# Patient Record
Sex: Male | Born: 1935 | Race: White | Hispanic: No | Marital: Married | Smoking: Never smoker
Health system: Southern US, Community
[De-identification: ages and names within clinical notes are randomized; demographics above are authoritative.]

## PROBLEM LIST (undated history)

## (undated) ENCOUNTER — Emergency Department (HOSPITAL_COMMUNITY): Payer: No Typology Code available for payment source

## (undated) DIAGNOSIS — M169 Osteoarthritis of hip, unspecified: Secondary | ICD-10-CM

## (undated) DIAGNOSIS — M1990 Unspecified osteoarthritis, unspecified site: Secondary | ICD-10-CM

## (undated) DIAGNOSIS — M545 Low back pain, unspecified: Secondary | ICD-10-CM

## (undated) DIAGNOSIS — E079 Disorder of thyroid, unspecified: Secondary | ICD-10-CM

## (undated) DIAGNOSIS — M79641 Pain in right hand: Secondary | ICD-10-CM

## (undated) DIAGNOSIS — Z87442 Personal history of urinary calculi: Secondary | ICD-10-CM

## (undated) DIAGNOSIS — N400 Enlarged prostate without lower urinary tract symptoms: Secondary | ICD-10-CM

## (undated) DIAGNOSIS — E039 Hypothyroidism, unspecified: Secondary | ICD-10-CM

## (undated) DIAGNOSIS — M4800 Spinal stenosis, site unspecified: Secondary | ICD-10-CM

## (undated) DIAGNOSIS — S82899A Other fracture of unspecified lower leg, initial encounter for closed fracture: Secondary | ICD-10-CM

## (undated) DIAGNOSIS — G8929 Other chronic pain: Secondary | ICD-10-CM

## (undated) DIAGNOSIS — H269 Unspecified cataract: Secondary | ICD-10-CM

## (undated) DIAGNOSIS — K7689 Other specified diseases of liver: Secondary | ICD-10-CM

## (undated) DIAGNOSIS — I10 Essential (primary) hypertension: Secondary | ICD-10-CM

## (undated) DIAGNOSIS — C801 Malignant (primary) neoplasm, unspecified: Secondary | ICD-10-CM

## (undated) DIAGNOSIS — M79642 Pain in left hand: Secondary | ICD-10-CM

## (undated) DIAGNOSIS — G4730 Sleep apnea, unspecified: Secondary | ICD-10-CM

## (undated) DIAGNOSIS — G2581 Restless legs syndrome: Secondary | ICD-10-CM

## (undated) DIAGNOSIS — N281 Cyst of kidney, acquired: Secondary | ICD-10-CM

## (undated) HISTORY — PX: EYE SURGERY: SHX253

## (undated) HISTORY — DX: Other fracture of unspecified lower leg, initial encounter for closed fracture: S82.899A

## (undated) HISTORY — DX: Benign prostatic hyperplasia without lower urinary tract symptoms: N40.0

## (undated) HISTORY — PX: BACK SURGERY: SHX140

## (undated) HISTORY — DX: Unspecified cataract: H26.9

## (undated) HISTORY — DX: Spinal stenosis, site unspecified: M48.00

---

## 1999-08-10 ENCOUNTER — Inpatient Hospital Stay (HOSPITAL_COMMUNITY): Admission: AD | Admit: 1999-08-10 | Discharge: 1999-08-11 | Payer: Self-pay | Admitting: Internal Medicine

## 1999-08-10 ENCOUNTER — Encounter: Payer: Self-pay | Admitting: Internal Medicine

## 1999-08-11 ENCOUNTER — Encounter: Payer: Self-pay | Admitting: Internal Medicine

## 2004-10-03 ENCOUNTER — Encounter: Admission: RE | Admit: 2004-10-03 | Discharge: 2004-10-03 | Payer: Self-pay | Admitting: Family Medicine

## 2004-10-11 ENCOUNTER — Encounter: Admission: RE | Admit: 2004-10-11 | Discharge: 2004-10-11 | Payer: Self-pay | Admitting: Family Medicine

## 2005-10-28 ENCOUNTER — Ambulatory Visit (HOSPITAL_COMMUNITY): Admission: RE | Admit: 2005-10-28 | Discharge: 2005-10-28 | Payer: Self-pay | Admitting: Orthopedic Surgery

## 2005-11-02 ENCOUNTER — Encounter: Admission: RE | Admit: 2005-11-02 | Discharge: 2005-11-02 | Payer: Self-pay | Admitting: Orthopedic Surgery

## 2005-11-19 ENCOUNTER — Encounter: Admission: RE | Admit: 2005-11-19 | Discharge: 2005-11-19 | Payer: Self-pay | Admitting: Orthopedic Surgery

## 2007-06-11 ENCOUNTER — Inpatient Hospital Stay (HOSPITAL_COMMUNITY): Admission: RE | Admit: 2007-06-11 | Discharge: 2007-06-12 | Payer: Self-pay | Admitting: Orthopedic Surgery

## 2010-09-04 NOTE — Op Note (Signed)
Edward Waller, Edward Waller               ACCOUNT NO.:  [id number]   MEDICAL RECORD NO.:  [id number]          PATIENT TYPE:  INP   LOCATION:  0005                         FACILITY:  [facility]   PHYSICIAN:  Marlowe Kays, M.D.  DATE OF BIRTH:  January 16, 1933   DATE OF PROCEDURE:  06/11/2007  DATE OF DISCHARGE:                               OPERATIVE REPORT   PREOPERATIVE DIAGNOSES:  Central and foraminal stenosis, L2-L3, L3-L4,  L4-L5, L5-S1.   POSTOPERATIVE DIAGNOSES:  Central and foraminal stenosis, L2-L3, L3-L4,  L4-L5, L5-S1.   OPERATION:  Central and foraminal decompression, L2 to the sacrum.   SURGEON:  Marlowe Kays, M.D.   ASSISTANT:  Georges Lynch. Darrelyn Hillock, M.D.   ANESTHESIA:  General.   JUSTIFICATION FOR PROCEDURE:  He is having back and bilateral leg-pain,  right greater than left.  This has become progressive over the last  several years.  Plain x-rays have shown a lumbar scoliosis and myelogram  CT scan has shown significant defects at all levels, particularly L3-L4  and L4-L5.   PROCEDURE:  Prophylactic antibiotics, satisfactory general anesthesia,  Foley catheter inserted, prone position on rolls.  Back was prepped with  DuraPrep and draped in sterile field.  Ioban utilized.  Time-out  performed.   I made a vertical midline incision with dissection of soft tissue off  the spinous processes, roughly the L2 to the sacrum.  I tagged two  spinous processes at the proximal and distal ends of wound with the  Kocher clamps being on L3 and the sacrum.  Consequently, I extended the  incision slightly cephalad, dissected additional tissue off and then re  x-rayed him, confirming that we were at L2 and at L5 with the Kocher  clamps and a Penfield 4 clamp at the L4-L5 interspace.  Based on this,  we dissected soft tissue off the neural arches from L2 to the sacrum.  I  placed two self-retaining McCullough retractors.  Double-action rongeur.  I then removed most of the neural arches from  L2 to the sacrum and then  began further decompression with 2 and 3 mm Kerrison rongeurs.  When  dissection became a little more challenging, we brought in the  microscope and completed the decompression, both centrally and  foraminally.  He had more right-leg pain than left and particularly at  L4-L5 and at L3-L4, his nerve roots at these levels were very sensitive.  There was a disc bulge at L4-L5, which was not readily accessible  because of his scoliosis and tightness, but foramina at all levels were  widely patent to hockey-stick.   At the conclusion of the case, we had estimated about 400 mL of blood  loss.  There was no dural tear or other complication noted.  The wounds  were irrigated with sterile saline and Gelfoam, soaked in thrombin, was  placed over the dura.  I then placed a 1/4 inch Penrose drain with  safety pin to the right posterior lower back.  We entirely removed the  self-retaining retractors and then closed the wound under direct  visualization to avoid impaling the drain with interrupted #  1 Vicryl in  the paralumbar muscle and fascia, 2-0 Vicryl in subcutaneous tissue and  staples in the skin.   Betadine and dry sterile dressing were applied.  He was taken to the  recovery room in satisfactory condition with no known complications.           ______________________________  Marlowe Kays, M.D.     JA/MEDQ  D:  06/11/2007  T:  06/12/2007  Job:  [id number]

## 2010-09-04 NOTE — H&P (Signed)
NAME:  Edward Waller, Edward Edward Waller               ACCOUNT NO.:  [id number]   MEDICAL RECORD NO.:  [id number]          PATIENT TYPE:  INP   LOCATION:  NA                           [facility]ACILITY:  [facility]   PHYSICIAN:  Marlowe Kays, M.D.  DATE OF BIRTH:  1932-05-26   DATE OF ADMISSION:  DATE OF DISCHARGE:                              HISTORY & PHYSICAL   CHIEF COMPLAINT:  Pain in my back and legs, more so on the right than  the left.   PRESENT ILLNESS:  This 77 year old white male has been seen by [place] for  continuing progressive problems concerning pain in his lumbar spine with  radiation to the lower extremities, more so on the right than the left.  He also has cramping in the lower extremities as well from time to time.  He has various levels of numbness that seen to come and go.  He has  had no injury to the lumbar spine but this has been a progressive thing,  which now is extremely uncomfortable.  We tried conservative care with  little result.  Myelogram has shown multilevel spinal stenosis from L2  to the sacrum.  He is a very active gentleman who is in excellent health  other than the mechanical problem concerning his lumbar spine and after  much consideration including the risks and benefits of surgery, it was  decided to go ahead central and foraminal decompressive lumbar  laminectomy from L2 to S1.  Again, all questions have been encouraged  and answered, both from him and his wife, so it was decided to go ahead  with the above surgery.   PAST MEDICAL HISTORY:  This gentleman has been in relatively good health  throughout his lifetime.  He is a very active gentleman.   Currently is taking Elavil 10 mg at bedtime, primarily for his pain.  He  also takes various other supplements, fish oil, etc., and one 81-mg  aspirin a day.   Donia Guiles, M.D., is his medical physician.   He has had no previous surgeries and denies any medical problems.   FAMILY HISTORY:  Positive for father, who died  of cardiovascular  disease, as well as his mother.  He has one son, had a heart attack at  age 87.   SOCIAL HISTORY:  The patient is married.  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  He has never had intake of  alcohol or tobacco products.  They have three children.  They live in a  one-level home and his wife, who is a retired Designer, jewellery, will be  his major caregiver after surgery.   REVIEW OF SYSTEMS:  CNS:  No seizure, _________ , paralysis, numbness,  double vision other than the present illness.  CARDIOVASCULAR:  No chest  pain, no angina, no orthopnea.  RESPIRATORY:  No productive cough, no  hemoptysis, no shortness of breath.  GASTROINTESTINAL:  No nausea,  vomiting, melena or bloody stool.  GENITOURINARY:  No discharge, dysuria  or hematuria.  MUSCULOSKELETAL:  Primarily in present illness/.   PHYSICAL EXAMINATION:  He is an alert and cooperative, fully-oriented 77-  year-old white male.  He is 6 feet and weighs 160 pounds.  HEENT:  Normocephalic.  PERRLA.  EOM intact.  Oropharynx is clear.  CHEST:  Clear to auscultation.  No rhonchi or rales.  HEART:  Regular rate and rhythm.  No murmurs are heard.  ABDOMEN:  Soft, nontender.  Liver, spleen not felt.  GENITALIA, RECTAL:  Not done, not pertinent to present illness.  EXTREMITIES:  Negative straight leg bilaterally.  Today neurovascular is  grossly intact.   ADMISSION DIAGNOSIS:  Spinal stenosis, multilevel, from L2 to sacrum.   PLAN:  The patient will undergo decompressive central and foraminal  lumbar laminectomy with diskectomy as needed from L2 to sacrum.      Dooley L. Cherlynn June.    ______________________________  Marlowe Kays, M.D.    DLU/MEDQ  D:  06/02/2007  T:  06/03/2007  Job:  [id number]   cc:   Donia Guiles, M.D.  Fax: [phone]
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
## 2011-01-11 LAB — BASIC METABOLIC PANEL
BUN: 13
CO2: 29
Calcium: 9.6
Chloride: 105
Creatinine, Ser: 0.85
GFR calc Af Amer: >60
GFR calc non Af Amer: >60
Glucose, Bld: 105 — ABNORMAL HIGH
Potassium: 4.3
Sodium: 143

## 2011-01-11 LAB — HEMOGLOBIN AND HEMATOCRIT, BLOOD
HCT: 48.5
Hemoglobin: 16.6

## 2013-03-11 ENCOUNTER — Encounter: Payer: Self-pay | Admitting: Podiatry

## 2013-03-11 ENCOUNTER — Ambulatory Visit (INDEPENDENT_AMBULATORY_CARE_PROVIDER_SITE_OTHER): Payer: Medicare Other | Admitting: Podiatry

## 2013-03-11 VITALS — BP 161/91 | HR 63 | Resp 16 | Ht 72.0 in | Wt 160.0 lb

## 2013-03-11 DIAGNOSIS — L84 Corns and callosities: Secondary | ICD-10-CM

## 2013-03-11 DIAGNOSIS — M779 Enthesopathy, unspecified: Secondary | ICD-10-CM

## 2013-03-11 MED ORDER — TRIAMCINOLONE ACETONIDE 10 MG/ML IJ SUSP
5.0000 mg | Freq: Once | INTRAMUSCULAR | Status: AC
  Administered 2013-03-11: 5 mg via INTRA_ARTICULAR

## 2013-03-11 NOTE — Progress Notes (Signed)
Subjective:     Patient ID: Edward Waller, male   DOB: 1932/07/05, 79 y.o.   MRN: [id number]  HPI patient states this left foot has started to hurt me again with fluid in the underlying joint   Review of Systems     Objective:   Physical Exam    neurovascular status unchanged health history unchanged and fluid and inflammation around the first metatarsal plantar head left with keratotic tissue formation Assessment:     Capsulitis of the left first MPJ with callus formation    Plan:     H&P done and careful cortisone injection administered plantar 3 mg Kenalog 5 mg Xylocaine followed by anesthesia and then deep debridement of lesion with no iatrogenic bleeding noted

## 2013-08-18 ENCOUNTER — Other Ambulatory Visit: Payer: Self-pay | Admitting: Orthopedic Surgery

## 2013-08-18 DIAGNOSIS — M47816 Spondylosis without myelopathy or radiculopathy, lumbar region: Secondary | ICD-10-CM

## 2013-08-27 ENCOUNTER — Ambulatory Visit
Admission: RE | Admit: 2013-08-27 | Discharge: 2013-08-27 | Disposition: A | Discharge status: Home / Self Care | Payer: Medicare Other | Source: Ambulatory Visit | Attending: Orthopedic Surgery | Admitting: Orthopedic Surgery

## 2013-08-27 DIAGNOSIS — M47816 Spondylosis without myelopathy or radiculopathy, lumbar region: Secondary | ICD-10-CM

## 2015-06-12 ENCOUNTER — Ambulatory Visit (INDEPENDENT_AMBULATORY_CARE_PROVIDER_SITE_OTHER): Payer: Medicare Other

## 2015-06-12 ENCOUNTER — Ambulatory Visit (INDEPENDENT_AMBULATORY_CARE_PROVIDER_SITE_OTHER): Payer: Medicare Other | Admitting: Podiatry

## 2015-06-12 ENCOUNTER — Encounter: Payer: Self-pay | Admitting: Podiatry

## 2015-06-12 VITALS — BP 140/82 | HR 74 | Resp 16

## 2015-06-12 DIAGNOSIS — L84 Corns and callosities: Secondary | ICD-10-CM | POA: Diagnosis not present

## 2015-06-12 DIAGNOSIS — M779 Enthesopathy, unspecified: Secondary | ICD-10-CM

## 2015-06-12 MED ORDER — TRIAMCINOLONE ACETONIDE 10 MG/ML IJ SUSP
10.0000 mg | Freq: Once | INTRAMUSCULAR | Status: AC
  Administered 2015-06-12: 10 mg

## 2015-06-13 NOTE — Progress Notes (Signed)
Subjective:     Patient ID: Edward Waller, male   DOB: Jul 01, 1932, 82 y.o.   MRN: [id number]  HPI patient presents with painful lesion outside of the fifth MPJ that is hard to walk with with fluid buildup noted   Review of Systems     Objective:   Physical Exam  inflammatory capsulitis with keratotic lesion fifth MPJ left    Assessment:      H&P condition reviewed and careful injection of the fifth MPJ administered 3 mg Kenalog dexamethasone 5 mg Xylocaine and did deep debridement of lesion    Plan:      capsulitis with inflammatory keratotic lesion

## 2018-06-29 DIAGNOSIS — G8929 Other chronic pain: Secondary | ICD-10-CM | POA: Insufficient documentation

## 2020-03-24 ENCOUNTER — Emergency Department (HOSPITAL_BASED_OUTPATIENT_CLINIC_OR_DEPARTMENT_OTHER): Payer: Medicare PPO

## 2020-03-24 ENCOUNTER — Emergency Department (HOSPITAL_BASED_OUTPATIENT_CLINIC_OR_DEPARTMENT_OTHER)
Admission: EM | Admit: 2020-03-24 | Discharge: 2020-03-24 | Disposition: A | Discharge status: Home / Self Care | Payer: Medicare PPO | Attending: Emergency Medicine | Admitting: Emergency Medicine

## 2020-03-24 ENCOUNTER — Encounter (HOSPITAL_BASED_OUTPATIENT_CLINIC_OR_DEPARTMENT_OTHER): Payer: Self-pay

## 2020-03-24 ENCOUNTER — Other Ambulatory Visit: Payer: Self-pay

## 2020-03-24 DIAGNOSIS — W11XXXA Fall on and from ladder, initial encounter: Secondary | ICD-10-CM | POA: Diagnosis not present

## 2020-03-24 DIAGNOSIS — W19XXXA Unspecified fall, initial encounter: Secondary | ICD-10-CM

## 2020-03-24 DIAGNOSIS — S0990XA Unspecified injury of head, initial encounter: Secondary | ICD-10-CM | POA: Insufficient documentation

## 2020-03-24 DIAGNOSIS — S82425A Nondisplaced transverse fracture of shaft of left fibula, initial encounter for closed fracture: Secondary | ICD-10-CM | POA: Insufficient documentation

## 2020-03-24 DIAGNOSIS — S90922A Unspecified superficial injury of left foot, initial encounter: Secondary | ICD-10-CM | POA: Diagnosis present

## 2020-03-24 DIAGNOSIS — S82839A Other fracture of upper and lower end of unspecified fibula, initial encounter for closed fracture: Secondary | ICD-10-CM

## 2020-03-24 HISTORY — DX: Disorder of thyroid, unspecified: E07.9

## 2020-03-24 NOTE — ED Triage Notes (Signed)
Pt was on ladder, fell 3 rungs, twisted ankle in ladder, stated did hit his head, denies injury to head, does not take blood thinners. No dizziness no LOC, complains of pain swelling left ankle.  Able to put small amount of weight on it to ambulate, but painful.

## 2020-03-24 NOTE — ED Provider Notes (Signed)
[facility]ERG[facility] DEPARTMENT Provider Note   CSN: [id number] Arrival date & time: 03/24/20  1315     History Chief Complaint  Patient presents with  . Fall    Edward Waller is a 86 y.o. male presents today after fall.  He was standing on a stepladder around the third rung when he was stepping backwards, his shoe got caught on the step and he fell backwards.  He reports his left foot was caught as he fell and he reports immediate pain to his left lateral ankle, constant throbbing nonradiating worsened by movement and palpation improved with rest.  He reports that he did hit the back of his head on the ground and saw stars in his eyes but did not lose consciousness.  Additionally patient has some small abrasions to his arms but no pain of those areas.  Finally patient reports he has chronic right hip pain he does not feel that his pain is any worse after his fall today and he has no pain with movement of that area but does believe that he fell onto his right hip  He denies any loss of consciousness, blood thinner use, headache, vision changes, neck pain, chest pain, back pain, abdominal pain, pelvic pain, pain of the right lower extremity, pain of the upper extremities, numbness/weakness, tingling or any additional concerns.  Patient reports that his Tdap is up-to-date in the last few years.  HPI     Past Medical History:  Diagnosis Date  . Thyroid disease     There are no problems to display for this patient.   Past Surgical History:  Procedure Laterality Date  . BACK SURGERY         History reviewed. No pertinent family history.  Social History   Tobacco Use  . Smoking status: Never Smoker  . Smokeless tobacco: Never Used  Vaping Use  . Vaping Use: Never used  Substance Use Topics  . Alcohol use: No  . Drug use: Never    Home Medications Prior to Admission medications   Medication Sig Start Date End Date Taking? Authorizing Provider  aspirin 81  MG tablet Take 81 mg by mouth daily.    [provider]  Fish Oil OIL by Does not apply route.    [provider]  Multiple Vitamin (MULTIVITAMIN) capsule Take 1 capsule by mouth daily.    [provider]  Saw Palmetto, Serenoa repens, (SAW PALMETTO PO) Take by mouth.    [provider]    Allergies    Patient has no known allergies.  Review of Systems   Review of Systems Ten systems are reviewed and are negative for acute change except as noted in the HPI  Physical Exam Updated Vital Signs BP (!) 166/86   Pulse 65   Temp 98.4 F (36.9 C) (Oral)   Resp 18   Ht 6' (1.829 m)   Wt 72.6 kg   SpO2 98%   BMI 21.70 kg/m   Physical Exam Constitutional:      General: He is not in acute distress.    Appearance: Normal appearance. He is well-developed. He is not ill-appearing or diaphoretic.  HENT:     Head: Normocephalic and atraumatic.  Eyes:     General: Vision grossly intact. Gaze aligned appropriately.     Pupils: Pupils are equal, round, and reactive to light.  Neck:     Trachea: Trachea and phonation normal.  Cardiovascular:     Rate and Rhythm:  Normal rate and regular rhythm.     Pulses: Normal pulses.          Dorsalis pedis pulses are 2+ on the right side and 2+ on the left side.  Pulmonary:     Effort: Pulmonary effort is normal. No respiratory distress.  Abdominal:     General: There is no distension.     Palpations: Abdomen is soft.     Tenderness: There is no abdominal tenderness. There is no guarding or rebound.  Musculoskeletal:        General: Normal range of motion.     Cervical back: Normal range of motion.     Right hip: No tenderness. Normal range of motion.     Left hip: No tenderness. Normal range of motion.     Right upper leg: No tenderness.     Left upper leg: No tenderness.     Right knee: Normal.     Left knee: Normal.     Right ankle: Normal.     Right Achilles Tendon: Normal.     Left ankle: Swelling  present. No deformity or lacerations. Tenderness present over the lateral malleolus.     Left Achilles Tendon: Normal.     Right foot: Normal range of motion. No deformity or tenderness.     Left foot: Normal range of motion. No deformity or tenderness.     Comments: No midline C/T/L spinal tenderness to palpation, no paraspinal muscle tenderness, no deformity, crepitus, or step-off noted. No sign of injury to the neck or back.  Pelvis stable to compression bilateral without pain.  Patient able bring bilateral knees to chest without pain or difficulty.  All major joints of bilateral upper extremities as well as the right lower extremity mobilized without pain or deformity  Feet:     Right foot:     Protective Sensation: 5 sites tested. 5 sites sensed.     Skin integrity: Skin integrity normal.     Left foot:     Protective Sensation: 5 sites tested. 5 sites sensed.     Skin integrity: Skin integrity normal.  Skin:    General: Skin is warm and dry.  Neurological:     Mental Status: He is alert.     GCS: GCS eye subscore is 4. GCS verbal subscore is 5. GCS motor subscore is 6.     Comments: Speech is clear and goal oriented, follows commands Major Cranial nerves without deficit, no facial droop Moves extremities without ataxia, coordination intact  Psychiatric:        Behavior: Behavior normal.     ED Results / Procedures / Treatments   Labs (all labs ordered are listed, but only abnormal results are displayed) Labs Reviewed - No data to display  EKG None  Radiology DG Chest 2 View  Result Date: 03/24/2020 CLINICAL DATA:  [place] off a ladder. EXAM: CHEST - 2 VIEW COMPARISON:  06/03/2007. FINDINGS: The lungs are clear without focal pneumonia, edema, pneumothorax or pleural effusion. Cardiopericardial silhouette is at upper limits of normal for size. The visualized bony structures of the thorax show no acute abnormality. IMPRESSION: No active cardiopulmonary disease. Electronically  Signed   By: Misty Stanley M.D.   On: 03/24/2020 15:54   DG Ankle Complete Left  Result Date: 03/24/2020 CLINICAL DATA:  [place] off a ladder.  Ankle injury. EXAM: LEFT ANKLE COMPLETE - 3+ VIEW COMPARISON:  No comparison studies available. FINDINGS: Three views study shows a transverse nondisplaced fracture through the  distal tip of the fibula, below the ankle mortise. No distal tibia fracture evident. Ankle mortise is preserved. Lateral soft tissue swelling evident. IMPRESSION: Transverse nondisplaced fracture through the distal tip of the fibula. Electronically Signed   By: Misty Stanley M.D.   On: 03/24/2020 14:47   CT Head Wo Contrast  Result Date: 03/24/2020 CLINICAL DATA:  Fall from a ladder. EXAM: CT HEAD WITHOUT CONTRAST CT CERVICAL SPINE WITHOUT CONTRAST TECHNIQUE: Multidetector CT imaging of the head and cervical spine was performed following the standard protocol without intravenous contrast. Multiplanar CT image reconstructions of the cervical spine were also generated. COMPARISON:  None. FINDINGS: CT HEAD FINDINGS Brain: There is no evidence of an acute infarct, intracranial hemorrhage, mass, midline shift, or extra-axial fluid collection. Mild cerebral atrophy is within normal limits for age. Vascular: Mild calcified atherosclerosis at the skull base. Skull: No fracture or suspicious osseous lesion. Sinuses/Orbits: Visualized paranasal sinuses and mastoid air cells are clear. Bilateral cataract extraction. Other: None. CT CERVICAL SPINE FINDINGS Alignment: Reversal of the normal cervical lordosis with grade 1 anterolisthesis of C3 on C4 and T1 on T2 and grade 1 retrolisthesis of C5 on C6 and C6 on C7. Skull base and vertebrae: No acute fracture or suspicious osseous lesion. Mild C1-2 arthropathy. Soft tissues and spinal canal: No prevertebral fluid or swelling. No visible canal hematoma. Disc levels: Advanced cervical disc degeneration with severe disc space narrowing, degenerative endplate  changes, and spurring from C4-5 to C7-T1 and milder disc degeneration at C2-3 and C3-4. Widespread severe facet arthrosis in the cervical and upper thoracic spine. Multilevel spinal stenosis, severe at C5-6. Widespread severe neural foraminal stenosis. Upper chest: Biapical lung scarring with calcification. Other: Mild calcified atherosclerosis at the carotid bifurcations. IMPRESSION: 1. No evidence of acute intracranial abnormality. 2. No acute cervical spine fracture. 3. Advanced cervical disc and facet degeneration. Electronically Signed   By: Logan Bores M.D.   On: 03/24/2020 15:57   CT Cervical Spine Wo Contrast  Result Date: 03/24/2020 CLINICAL DATA:  Fall from a ladder. EXAM: CT HEAD WITHOUT CONTRAST CT CERVICAL SPINE WITHOUT CONTRAST TECHNIQUE: Multidetector CT imaging of the head and cervical spine was performed following the standard protocol without intravenous contrast. Multiplanar CT image reconstructions of the cervical spine were also generated. COMPARISON:  None. FINDINGS: CT HEAD FINDINGS Brain: There is no evidence of an acute infarct, intracranial hemorrhage, mass, midline shift, or extra-axial fluid collection. Mild cerebral atrophy is within normal limits for age. Vascular: Mild calcified atherosclerosis at the skull base. Skull: No fracture or suspicious osseous lesion. Sinuses/Orbits: Visualized paranasal sinuses and mastoid air cells are clear. Bilateral cataract extraction. Other: None. CT CERVICAL SPINE FINDINGS Alignment: Reversal of the normal cervical lordosis with grade 1 anterolisthesis of C3 on C4 and T1 on T2 and grade 1 retrolisthesis of C5 on C6 and C6 on C7. Skull base and vertebrae: No acute fracture or suspicious osseous lesion. Mild C1-2 arthropathy. Soft tissues and spinal canal: No prevertebral fluid or swelling. No visible canal hematoma. Disc levels: Advanced cervical disc degeneration with severe disc space narrowing, degenerative endplate changes, and spurring from  C4-5 to C7-T1 and milder disc degeneration at C2-3 and C3-4. Widespread severe facet arthrosis in the cervical and upper thoracic spine. Multilevel spinal stenosis, severe at C5-6. Widespread severe neural foraminal stenosis. Upper chest: Biapical lung scarring with calcification. Other: Mild calcified atherosclerosis at the carotid bifurcations. IMPRESSION: 1. No evidence of acute intracranial abnormality. 2. No acute cervical spine fracture. 3. Advanced  cervical disc and facet degeneration. Electronically Signed   By: Logan Bores M.D.   On: 03/24/2020 15:57   DG Hip Unilat W or Wo Pelvis 2-3 Views Right  Result Date: 03/24/2020 CLINICAL DATA:  Pain after fall. EXAM: DG HIP (WITH OR WITHOUT PELVIS) 2-3V RIGHT COMPARISON:  None. FINDINGS: Significant degenerative changes in both hips with loss of joint space and osteophytes. No fracture. IMPRESSION: Degenerative changes in both hips.  No fracture or dislocation. Electronically Signed   By: Dorise Bullion III M.D   On: 03/24/2020 15:55    Procedures Procedures (including critical care time)  Medications Ordered in ED Medications - No data to display  ED Course  I have reviewed the triage vital signs and the nursing notes.  Pertinent labs & imaging results that were available during my care of the patient were reviewed by me and considered in my medical decision making (see chart for details).  Clinical Course as of Mar 24 1729  Fri Mar 24, 2020  1449 Distal fibula fx  DG Ankle Complete Left [BM]  1538 No bleed  CT Head Wo Contrast [BM]  1538 Degenerative  CT Cervical Spine Wo Contrast [BM]  1539 Negative  DG Chest 2 View [BM]  1539 Negative  DG Hip Unilat W or Wo Pelvis 2-3 Views Right [BM]    Clinical Course User Index [BM] Gari Crown   MDM Rules/Calculators/A&P                         Additional history obtained from: 1. Nursing notes from this visit. 2. Review of electronic medical record.  No recent visits  through EMR system. ---------------------- 86 year old male presents for left lateral ankle pain after he fell stepping off a stepladder today, his foot was caught in the wrong causing his fall.  No blood thinner use.  Did hit his head but no loss of consciousness.  His only area of pain is his left lateral ankle.  Will obtain x-ray of the left ankle and given his age and mechanism will obtain CT head and CT cervical spine as well as x-rays of the chest and pelvis to assess for traumatic injury.  There is no additional imaging indicated at this time. ---------------- CT Head/Cspine:  IMPRESSION:  1. No evidence of acute intracranial abnormality.  2. No acute cervical spine fracture.  3. Advanced cervical disc and facet degeneration.   DG Chest:  IMPRESSION:  No active cardiopulmonary disease.   DG Pelvis/Right Hip:  IMPRESSION:  Degenerative changes in both hips. No fracture or dislocation.   DG Ankle:  IMPRESSION:  Transverse nondisplaced fracture through the distal tip of the  fibula.   Patient seen and evaluated by Dr. Laverta Baltimore, patient placed in cam walker given walker and orthopedic follow-up regarding ankle fracture.  Advised nonweightbearing.  No indication for further work-up at this time he appears stable for discharge and outpatient follow-up.  OTC anti-inflammatories and rice therapy discussed.  Will avoid narcotics given patient's age and that he will be using crutches.  Patient informed of imaging findings today as well as incidental findings and plans to follow-up with his PCP.  At this time there does not appear to be any evidence of an acute emergency medical condition and the patient appears stable for discharge with appropriate outpatient follow up. Diagnosis was discussed with patient who verbalizes understanding of care plan and is agreeable to discharge. I have discussed return precautions with patient  who verbalizes understanding. Patient encouraged to follow-up with their  PCP and ortho. All questions answered.  Patient's case discussed with Dr. Laverta Baltimore who agrees with plan to discharge with Orthopedic follow-up.   Note: Portions of this report may have been transcribed using voice recognition software. Every effort was made to ensure accuracy; however, inadvertent computerized transcription errors may still be present. Final Clinical Impression(s) / ED Diagnoses Final diagnoses:  Fall, initial encounter  Closed fracture of distal end of fibula, unspecified fracture morphology, initial encounter    Rx / DC Orders ED Discharge Orders    None       Gari Crown 03/24/20 1744    Margette Fast, MD 03/25/20 1139

## 2020-03-24 NOTE — Discharge Instructions (Addendum)
At this time there does not appear to be the presence of an emergent medical condition, however there is always the potential for conditions to change. Please read and follow the below instructions.  Please return to the Emergency Department immediately for any new or worsening symptoms. Please be sure to follow up with your Primary Care Provider within one week regarding your visit today; please call their office to schedule an appointment even if you are feeling better for a follow-up visit. Please call the on-call orthopedic specialist Dr. Alma Friendly on your discharge paperwork to schedule follow-up appointment for further evaluation and treatment of your fibula fracture. Please use rest ice and elevation to help with pain and swelling.  Please take Ibuprofen (Advil, motrin) and Tylenol (acetaminophen) to relieve your pain.  You may take up to 400 MG (2 pills) of normal strength ibuprofen every 8 hours as needed.  In between doses of ibuprofen you make take tylenol, up to 500 mg (one extra strength pills).  Do not take more than 3,000 mg tylenol in a 24 hour period.  Please check all medication labels as many medications such as pain and cold medications may contain tylenol.  Do not drink alcohol while taking these medications.  Do not take other NSAID'S while taking ibuprofen (such as aleve or naproxen).  Please take ibuprofen with food to decrease stomach upset.  Go to the nearest Emergency Department immediately if: You have fever or chills You have: A very bad headache that is not helped by medicine. Trouble walking or weakness in your arms and legs. Clear or bloody fluid coming from your nose or ears. Changes in how you see (vision). Shaking movements that you cannot control. You lose your balance. You vomit. The black centers of your eyes (pupils) change in size. Your speech is slurred. Your dizziness gets worse. You pass out. You are sleepier than normal and have trouble staying  awake. You develop severe pain or more swelling in your ankle or foot that cannot be controlled with medicines. Your skin or nails below the injury turn blue or gray, feel cold, or become numb. The skin under your cast burns or stings. There is a bad smell or pus coming from under the cast. You cannot move your toes. You have any new/concerning or worsening of symptoms   Please read the additional information packets attached to your discharge summary.  Do not take your medicine if  develop an itchy rash, swelling in your mouth or lips, or difficulty breathing; call 911 and seek immediate emergency medical attention if this occurs.  You may review your lab tests and imaging results in their entirety on your MyChart account.  Please discuss all results of fully with your primary care provider and other specialist at your follow-up visit.  Note: Portions of this text may have been transcribed using voice recognition software. Every effort was made to ensure accuracy; however, inadvertent computerized transcription errors may still be present.

## 2020-03-24 NOTE — ED Triage Notes (Signed)
Pt states he fell from 3rd step on a 103ft ladder ~ 1hour PTA-c/o pain to left ankle-EMS taped a pillow to ankle-pt denies head/neck pain-denies LOC-NAD-to triage in w/c

## 2020-03-27 ENCOUNTER — Other Ambulatory Visit: Payer: Self-pay

## 2020-03-27 ENCOUNTER — Encounter (HOSPITAL_BASED_OUTPATIENT_CLINIC_OR_DEPARTMENT_OTHER): Payer: Self-pay | Admitting: *Deleted

## 2020-03-27 ENCOUNTER — Emergency Department (HOSPITAL_BASED_OUTPATIENT_CLINIC_OR_DEPARTMENT_OTHER): Payer: Medicare PPO

## 2020-03-27 ENCOUNTER — Emergency Department (HOSPITAL_BASED_OUTPATIENT_CLINIC_OR_DEPARTMENT_OTHER)
Admission: EM | Admit: 2020-03-27 | Discharge: 2020-03-27 | Disposition: A | Discharge status: Home / Self Care | Payer: Medicare PPO | Attending: Emergency Medicine | Admitting: Emergency Medicine

## 2020-03-27 DIAGNOSIS — Z7982 Long term (current) use of aspirin: Secondary | ICD-10-CM | POA: Insufficient documentation

## 2020-03-27 DIAGNOSIS — S90822A Blister (nonthermal), left foot, initial encounter: Secondary | ICD-10-CM | POA: Insufficient documentation

## 2020-03-27 DIAGNOSIS — X58XXXA Exposure to other specified factors, initial encounter: Secondary | ICD-10-CM | POA: Insufficient documentation

## 2020-03-27 DIAGNOSIS — S99922A Unspecified injury of left foot, initial encounter: Secondary | ICD-10-CM | POA: Diagnosis present

## 2020-03-27 DIAGNOSIS — T148XXA Other injury of unspecified body region, initial encounter: Secondary | ICD-10-CM

## 2020-03-27 MED ORDER — CYCLOBENZAPRINE HCL 5 MG PO TABS
5.0000 mg | ORAL_TABLET | Freq: Once | ORAL | Status: AC
  Administered 2020-03-27: 5 mg via ORAL
  Filled 2020-03-27: qty 1

## 2020-03-27 MED ORDER — CLINDAMYCIN HCL 150 MG PO CAPS
300.0000 mg | ORAL_CAPSULE | Freq: Once | ORAL | Status: AC
  Administered 2020-03-27: 300 mg via ORAL
  Filled 2020-03-27: qty 2

## 2020-03-27 MED ORDER — CYCLOBENZAPRINE HCL 10 MG PO TABS: 5.0000 mg | ORAL_TABLET | Freq: Three times a day (TID) | ORAL | 0 refills | Status: DC | PRN

## 2020-03-27 MED ORDER — CLINDAMYCIN HCL 150 MG PO CAPS: 150.0000 mg | ORAL_CAPSULE | Freq: Three times a day (TID) | ORAL | 0 refills | Status: DC

## 2020-03-27 NOTE — ED Triage Notes (Signed)
C/o left foot FX, placed in boot, today c/o blisters to left foot

## 2020-03-27 NOTE — Discharge Instructions (Addendum)
Wear the boot when ambulating, take it off when not.  Follow-up with Dr. Pollie Friar clinic on Wednesday, call tomorrow to find out when.

## 2020-03-27 NOTE — ED Provider Notes (Addendum)
[facility] DEPARTMENT Provider Note   CSN: [id number] Arrival date & time: 03/27/20  1737     History Chief Complaint  Patient presents with  . Foot Injury    blisters    Edward Waller is a 86 y.o. male.   Foot Injury Location:  Ankle Ankle location:  L ankle Pain details:    Quality:  Aching   Onset quality:  Gradual   Timing:  Constant   Progression:  Worsening Chronicity:  New Prior injury to area:  Yes Relieved by:  Nothing Worsened by:  Nothing Ineffective treatments:  None tried Associated symptoms: swelling   Associated symptoms: no back pain and no fever        Past Medical History:  Diagnosis Date  . Thyroid disease     There are no problems to display for this patient.   Past Surgical History:  Procedure Laterality Date  . BACK SURGERY         No family history on file.  Social History   Tobacco Use  . Smoking status: Never Smoker  . Smokeless tobacco: Never Used  Vaping Use  . Vaping Use: Never used  Substance Use Topics  . Alcohol use: No  . Drug use: Never    Home Medications Prior to Admission medications   Medication Sig Start Date End Date Taking? Authorizing Provider  aspirin 81 MG tablet Take 81 mg by mouth daily.    [provider]  cyclobenzaprine (FLEXERIL) 10 MG tablet Take 0.5 tablets (5 mg total) by mouth 3 (three) times daily as needed for up to 12 doses for muscle spasms. 03/27/20   Breck Coons, MD  Fish Oil OIL by Does not apply route.    [provider]  Multiple Vitamin (MULTIVITAMIN) capsule Take 1 capsule by mouth daily.    [provider]  Saw Palmetto, Serenoa repens, (SAW PALMETTO PO) Take by mouth.    [provider]    Allergies    Patient has no known allergies.  Review of Systems   Review of Systems  Constitutional: Negative for chills and fever.  HENT: Negative for congestion and rhinorrhea.   Respiratory: Negative for cough and shortness  of breath.   Cardiovascular: Negative for chest pain and palpitations.  Gastrointestinal: Negative for diarrhea, nausea and vomiting.  Genitourinary: Negative for difficulty urinating and dysuria.  Musculoskeletal: Positive for arthralgias and joint swelling. Negative for back pain.  Skin: Positive for color change. Negative for rash.  Neurological: Negative for light-headedness and headaches.    Physical Exam Updated Vital Signs BP 130/76 (BP Location: Left Arm)   Pulse 66   Temp 98 F (36.7 C) (Oral)   Resp 20   Ht 6' (1.829 m)   SpO2 100%   BMI 21.70 kg/m   Physical Exam Vitals and nursing note reviewed.  Constitutional:      General: He is not in acute distress.    Appearance: Normal appearance.  HENT:     Head: Normocephalic and atraumatic.     Nose: No rhinorrhea.  Eyes:     General:        Right eye: No discharge.        Left eye: No discharge.     Conjunctiva/sclera: Conjunctivae normal.  Cardiovascular:     Rate and Rhythm: Normal rate and regular rhythm.  Pulmonary:     Effort: Pulmonary effort is normal.     Breath sounds: No stridor.  Abdominal:  General: Abdomen is flat. There is no distension.     Palpations: Abdomen is soft.  Musculoskeletal:        General: Swelling and tenderness present. No deformity or signs of injury.       Legs:  Skin:    General: Skin is warm and dry.  Neurological:     General: No focal deficit present.     Mental Status: He is alert. Mental status is at baseline.     Motor: No weakness.  Psychiatric:        Mood and Affect: Mood normal.        Behavior: Behavior normal.        Thought Content: Thought content normal.     ED Results / Procedures / Treatments   Labs (all labs ordered are listed, but only abnormal results are displayed) Labs Reviewed - No data to display  EKG None  Radiology DG Foot Complete Left  Result Date: 03/27/2020 CLINICAL DATA:  Left foot blisters. EXAM: LEFT FOOT - COMPLETE 3+ VIEW  COMPARISON:  June 12, 2015 FINDINGS: There is no evidence of fracture or dislocation. There is no evidence of arthropathy or other focal bone abnormality. Mild to moderate severity soft tissue swelling is seen along the dorsal aspect of the distal left foot. IMPRESSION: Dorsal soft tissue swelling without evidence of acute osseous abnormality. Electronically Signed   By: Virgina Norfolk M.D.   On: 03/27/2020 22:05    Procedures Procedures (including critical care time)  Medications Ordered in ED Medications  clindamycin (CLEOCIN) capsule 300 mg (300 mg Oral Given 03/27/20 2014)  cyclobenzaprine (FLEXERIL) tablet 5 mg (5 mg Oral Given 03/27/20 2018)    ED Course  I have reviewed the triage vital signs and the nursing notes.  Pertinent labs & imaging results that were available during my care of the patient were reviewed by me and considered in my medical decision making (see chart for details).    MDM Rules/Calculators/A&P                          Patient has what looks like complication of swelling from of ankle injury and friction from his removable boot.  He is able to bear weight but the pain and swelling is getting worse.  He is neurovascular intact.  There is slight concern for possible cellulitic change however no fevers chills systemic signs of illness, however I spoke with Dr. Lucia Gaskins from orthopedic surgery and he states this is fracture blisters, highly unlikely to be infectious, therefore we will not give antibiotics.  He recommends putting Xeroform dressing with Ace wrap and then put the boot on only for ambulating, and he will follow up with him in 2 days..  This is a nondisplaced fracture of the we will address with a posterior short leg splint.    We will have him follow-up with orthopedist sooner rather than later.  Family agrees to this plan.  Is also having spasm with pain, will give muscle relaxer.    Final Clinical Impression(s) / ED Diagnoses Final diagnoses:  Blister     Rx / DC Orders ED Discharge Orders         Ordered    clindamycin (CLEOCIN) 150 MG capsule  3 times daily,   Status:  Discontinued        03/27/20 2004    cyclobenzaprine (FLEXERIL) 10 MG tablet  3 times daily PRN  03/27/20 2004           Breck Coons, MD 03/27/20 2006    Breck Coons, MD 03/27/20 2233

## 2020-03-27 NOTE — ED Notes (Addendum)
Extended planning and talks since 20:00 on plan for wounds on PT foot and how to proceed with ortho care. While preparing Posterior Short Leg Splint PT wife assisted in holding. Finger slipped opening Aprox 2inX4in Blister Proximal Left Foot. MD aware, reassessed, ortho consulted, plan of care updated. Education given to family and PT on wound care.

## 2020-05-08 ENCOUNTER — Ambulatory Visit: Payer: Medicare PPO | Admitting: Nurse Practitioner

## 2020-05-16 ENCOUNTER — Ambulatory Visit (INDEPENDENT_AMBULATORY_CARE_PROVIDER_SITE_OTHER): Payer: Medicare PPO | Admitting: Nurse Practitioner

## 2020-05-16 ENCOUNTER — Other Ambulatory Visit: Payer: Self-pay

## 2020-05-16 ENCOUNTER — Encounter: Payer: Self-pay | Admitting: Nurse Practitioner

## 2020-05-16 VITALS — BP 165/78 | HR 72 | Temp 97.5°F | Ht 72.0 in | Wt 176.0 lb

## 2020-05-16 DIAGNOSIS — S82892D Other fracture of left lower leg, subsequent encounter for closed fracture with routine healing: Secondary | ICD-10-CM

## 2020-05-16 DIAGNOSIS — N400 Enlarged prostate without lower urinary tract symptoms: Secondary | ICD-10-CM | POA: Insufficient documentation

## 2020-05-16 DIAGNOSIS — Z139 Encounter for screening, unspecified: Secondary | ICD-10-CM | POA: Insufficient documentation

## 2020-05-16 DIAGNOSIS — R2241 Localized swelling, mass and lump, right lower limb: Secondary | ICD-10-CM | POA: Diagnosis not present

## 2020-05-16 DIAGNOSIS — E039 Hypothyroidism, unspecified: Secondary | ICD-10-CM | POA: Insufficient documentation

## 2020-05-16 DIAGNOSIS — S82892A Other fracture of left lower leg, initial encounter for closed fracture: Secondary | ICD-10-CM | POA: Insufficient documentation

## 2020-05-16 DIAGNOSIS — G2581 Restless legs syndrome: Secondary | ICD-10-CM | POA: Insufficient documentation

## 2020-05-16 DIAGNOSIS — Z7689 Persons encountering health services in other specified circumstances: Secondary | ICD-10-CM | POA: Diagnosis not present

## 2020-05-16 MED ORDER — ROPINIROLE HCL 1 MG PO TABS: 1.0000 mg | ORAL_TABLET | Freq: Every evening | ORAL | 1 refills | Status: DC | PRN

## 2020-05-16 NOTE — Assessment & Plan Note (Addendum)
-  prevents him from falling asleep at night -Rx. ropinirole  -he took some of a friend's medication and it helped, but he can't recall the name

## 2020-05-16 NOTE — Patient Instructions (Signed)
It was great meeting you today.  We will meet back up in 2 weeks for a physical. We will get fasting labs 2-3 days prior to that appointment.  For restless legs syndrome, I checked the reference material and did not find a medication with a name similar to the ones that you were naming.  I called in ropinirole for restless legs, and if you remember the name of the medication, we can discuss it at the physical exam.

## 2020-05-16 NOTE — Progress Notes (Signed)
New Patient Office Visit  Subjective:  Patient ID: Edward Waller, male    DOB: 14-Sep-1932  Age: 87 y.o. MRN: [id number]  CC:  Chief Complaint  Patient presents with  . New Patient (Initial Visit)    Here to establish care. Complains of restless legs, this has been ongoing x1 month    HPI Edward Waller presents for new patient visit. Transferring care from the [place], Landry Mellow in [place].  He was with Edward Waller in [facility] previously. Last physical was 2 years ago. Last labs were done then as well.  Seeing Edward Waller for left ankle fracture.  Past Medical History:  Diagnosis Date  . Cataract    bilateral  . Fx ankle    left; Dec 2021  . 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   . Spinal stenosis   . Thyroid disease     Past Surgical History:  Procedure Laterality Date  . BACK SURGERY      History reviewed. No pertinent family history.  Social History   Socioeconomic History  . Marital status: Married    Spouse name: Not on file  . Number of children: Not on file  . Years of education: Not on file  . Highest education level: Not on file  Occupational History  . Not on file  Tobacco Use  . Smoking status: Never Smoker  . Smokeless tobacco: Never Used  Vaping Use  . Vaping Use: Never used  Substance and Sexual Activity  . Alcohol use: No  . Drug use: Never  . Sexual activity: Not on file  Other Topics Concern  . Not on file  Social History Narrative  . Not on file   Social Determinants of Health   Financial Resource Strain: Not on file  Food Insecurity: Not on file  Transportation Needs: Not on file  Physical Activity: Not on file  Stress: Not on file  Social Connections: Not on file  Intimate Partner Violence: Not on file    ROS Review of Systems  Constitutional: Negative.   Respiratory: Negative.   Cardiovascular: Negative.   Musculoskeletal: Positive for joint swelling.       Left ankle swelling     Objective:   Today's Vitals: BP (!) 165/78 (BP Location: Right Arm, Patient Position: Sitting, Cuff Size: Normal)   Pulse 72   Temp (!) 97.5 F (36.4 C) (Temporal)   Ht 6' (1.829 m)   Wt 176 lb (79.8 kg)   SpO2 97%   BMI 23.87 kg/m   Physical Exam Constitutional:      Appearance: Normal appearance.  Cardiovascular:     Rate and Rhythm: Normal rate and regular rhythm.     Pulses: Normal pulses.     Heart sounds: Normal heart sounds.  Pulmonary:     Effort: Pulmonary effort is normal.     Breath sounds: Normal breath sounds.  Musculoskeletal:        General: Swelling present.     Comments: To left lower leg; right lower leg has mass to lateral side, round with 1 inch diameter  Neurological:     Mental Status: He is alert.     Assessment & Plan:   Problem List Items Addressed This Visit      Endocrine   Hypothyroidism    -no labs to review today -takes levothyroxine 50 mcg daily      Relevant Medications   levothyroxine (SYNTHROID) 50 MCG tablet     Musculoskeletal and Integument  Closed left ankle fracture    -followed by [facility]  -will request records -has some pitting edema to left ankle/foot        Genitourinary   BPH (benign prostatic hyperplasia)    -no issues today -takes saw palmetto capsules -will draw PSA      Relevant Orders   PSA     Other   Encounter to establish care   Relevant Orders   CBC with Differential/Platelet   Lipid Panel With LDL/HDL Ratio   TSH + free T4   Restless legs    -prevents him from falling asleep at night -Rx. ropinirole  -he took some of a friend's medication and it helped, but he can't recall the name      Relevant Medications   rOPINIRole (REQUIP) 1 MG tablet   Mass of right lower leg - Primary    -likely benign, feels like lipoma -we discussed imaging today, but he declines -would consider u/s as this feels like soft tissue      RESOLVED: Screening due      Outpatient Encounter  Medications as of 05/16/2020  Medication Sig  . Flaxseed, Linseed, (FLAX SEED OIL PO) Take by mouth.  . levothyroxine (SYNTHROID) 50 MCG tablet Take 50 mcg by mouth daily before breakfast.  . Multiple Vitamin (MULTIVITAMIN) capsule Take 1 capsule by mouth daily.  Marland Kitchen rOPINIRole (REQUIP) 1 MG tablet Take 1 tablet (1 mg total) by mouth at bedtime as needed.  . Saw Palmetto, Serenoa repens, (SAW PALMETTO PO) Take by mouth.  . [DISCONTINUED] aspirin 81 MG tablet Take 81 mg by mouth daily. (Patient not taking: Reported on 05/16/2020)  . [DISCONTINUED] cyclobenzaprine (FLEXERIL) 10 MG tablet Take 0.5 tablets (5 mg total) by mouth 3 (three) times daily as needed for up to 12 doses for muscle spasms. (Patient not taking: Reported on 05/16/2020)  . [DISCONTINUED] Fish Oil OIL by Does not apply route. (Patient not taking: Reported on 05/16/2020)   No facility-administered encounter medications on file as of 05/16/2020.    Follow-up: Return in about 2 weeks (around 05/30/2020) for Physical Exam.   Edward Larsson, NP

## 2020-05-16 NOTE — Assessment & Plan Note (Signed)
-  no labs to review today -takes levothyroxine 50 mcg daily

## 2020-05-16 NOTE — Assessment & Plan Note (Addendum)
-  no issues today -takes saw palmetto capsules -will draw PSA

## 2020-05-16 NOTE — Assessment & Plan Note (Signed)
-  likely benign, feels like lipoma -we discussed imaging today, but he declines -would consider u/s as this feels like soft tissue

## 2020-05-16 NOTE — Assessment & Plan Note (Signed)
-  followed by [facility]  -will request records -has some pitting edema to left ankle/foot

## 2020-05-30 ENCOUNTER — Encounter: Payer: Non-veteran care | Admitting: Nurse Practitioner

## 2020-09-19 ENCOUNTER — Telehealth: Payer: Self-pay | Admitting: Nurse Practitioner

## 2020-09-19 NOTE — Telephone Encounter (Signed)
Left message for patient to call back and schedule Medicare Annual Wellness Visit (AWV) either virtually or in office.   AWV-I PER PALMETTO 04/22/2009  please schedule at anytime with [facility]  health coach  This should be a 40 minute visit.

## 2020-12-01 DIAGNOSIS — Z20822 Contact with and (suspected) exposure to covid-19: Secondary | ICD-10-CM | POA: Diagnosis not present

## 2020-12-01 DIAGNOSIS — U071 COVID-19: Secondary | ICD-10-CM | POA: Diagnosis not present

## 2020-12-05 DIAGNOSIS — U071 COVID-19: Secondary | ICD-10-CM | POA: Diagnosis not present

## 2020-12-05 DIAGNOSIS — Z9189 Other specified personal risk factors, not elsewhere classified: Secondary | ICD-10-CM | POA: Diagnosis not present

## 2020-12-12 DIAGNOSIS — Z9189 Other specified personal risk factors, not elsewhere classified: Secondary | ICD-10-CM | POA: Diagnosis not present

## 2020-12-12 DIAGNOSIS — U071 COVID-19: Secondary | ICD-10-CM | POA: Diagnosis not present

## 2020-12-14 ENCOUNTER — Ambulatory Visit: Payer: Non-veteran care

## 2021-04-22 HISTORY — PX: TOTAL HIP ARTHROPLASTY: SHX124

## 2021-07-06 ENCOUNTER — Inpatient Hospital Stay (HOSPITAL_COMMUNITY): Payer: No Typology Code available for payment source

## 2021-07-06 ENCOUNTER — Emergency Department (HOSPITAL_COMMUNITY): Payer: No Typology Code available for payment source

## 2021-07-06 ENCOUNTER — Inpatient Hospital Stay (HOSPITAL_COMMUNITY): Payer: No Typology Code available for payment source | Admitting: Certified Registered Nurse Anesthetist

## 2021-07-06 ENCOUNTER — Inpatient Hospital Stay (HOSPITAL_COMMUNITY)
Admission: EM | Admit: 2021-07-06 | Discharge: 2021-07-12 | DRG: 493 | Disposition: A | Discharge status: Home Health | Payer: No Typology Code available for payment source | Attending: Family Medicine | Admitting: Family Medicine

## 2021-07-06 ENCOUNTER — Encounter (HOSPITAL_COMMUNITY): Payer: Self-pay | Admitting: Emergency Medicine

## 2021-07-06 ENCOUNTER — Other Ambulatory Visit: Payer: Self-pay

## 2021-07-06 ENCOUNTER — Encounter (HOSPITAL_COMMUNITY)
Admission: EM | Disposition: A | Discharge status: Home Health | Payer: Self-pay | Source: Home / Self Care | Attending: Internal Medicine

## 2021-07-06 DIAGNOSIS — R030 Elevated blood-pressure reading, without diagnosis of hypertension: Secondary | ICD-10-CM

## 2021-07-06 DIAGNOSIS — W1809XA Striking against other object with subsequent fall, initial encounter: Secondary | ICD-10-CM | POA: Diagnosis present

## 2021-07-06 DIAGNOSIS — D62 Acute posthemorrhagic anemia: Secondary | ICD-10-CM | POA: Diagnosis not present

## 2021-07-06 DIAGNOSIS — S0231XA Fracture of orbital floor, right side, initial encounter for closed fracture: Secondary | ICD-10-CM | POA: Diagnosis present

## 2021-07-06 DIAGNOSIS — K5900 Constipation, unspecified: Secondary | ICD-10-CM | POA: Diagnosis present

## 2021-07-06 DIAGNOSIS — S82201A Unspecified fracture of shaft of right tibia, initial encounter for closed fracture: Secondary | ICD-10-CM

## 2021-07-06 DIAGNOSIS — D696 Thrombocytopenia, unspecified: Secondary | ICD-10-CM | POA: Diagnosis present

## 2021-07-06 DIAGNOSIS — R351 Nocturia: Secondary | ICD-10-CM | POA: Diagnosis present

## 2021-07-06 DIAGNOSIS — S0285XA Fracture of orbit, unspecified, initial encounter for closed fracture: Principal | ICD-10-CM

## 2021-07-06 DIAGNOSIS — S82301A Unspecified fracture of lower end of right tibia, initial encounter for closed fracture: Secondary | ICD-10-CM | POA: Diagnosis present

## 2021-07-06 DIAGNOSIS — E039 Hypothyroidism, unspecified: Secondary | ICD-10-CM | POA: Diagnosis present

## 2021-07-06 DIAGNOSIS — G2581 Restless legs syndrome: Secondary | ICD-10-CM | POA: Diagnosis present

## 2021-07-06 DIAGNOSIS — S2220XA Unspecified fracture of sternum, initial encounter for closed fracture: Secondary | ICD-10-CM

## 2021-07-06 DIAGNOSIS — Z79890 Hormone replacement therapy (postmenopausal): Secondary | ICD-10-CM | POA: Diagnosis not present

## 2021-07-06 DIAGNOSIS — Z79899 Other long term (current) drug therapy: Secondary | ICD-10-CM

## 2021-07-06 DIAGNOSIS — D72829 Elevated white blood cell count, unspecified: Secondary | ICD-10-CM

## 2021-07-06 DIAGNOSIS — R52 Pain, unspecified: Secondary | ICD-10-CM

## 2021-07-06 DIAGNOSIS — S00219A Abrasion of unspecified eyelid and periocular area, initial encounter: Secondary | ICD-10-CM | POA: Diagnosis present

## 2021-07-06 DIAGNOSIS — S82201S Unspecified fracture of shaft of right tibia, sequela: Secondary | ICD-10-CM | POA: Diagnosis not present

## 2021-07-06 DIAGNOSIS — S00211A Abrasion of right eyelid and periocular area, initial encounter: Secondary | ICD-10-CM | POA: Diagnosis not present

## 2021-07-06 DIAGNOSIS — H269 Unspecified cataract: Secondary | ICD-10-CM | POA: Diagnosis present

## 2021-07-06 DIAGNOSIS — Z23 Encounter for immunization: Secondary | ICD-10-CM | POA: Diagnosis not present

## 2021-07-06 DIAGNOSIS — M898X9 Other specified disorders of bone, unspecified site: Secondary | ICD-10-CM | POA: Diagnosis present

## 2021-07-06 DIAGNOSIS — N401 Enlarged prostate with lower urinary tract symptoms: Secondary | ICD-10-CM | POA: Diagnosis present

## 2021-07-06 DIAGNOSIS — S82831A Other fracture of upper and lower end of right fibula, initial encounter for closed fracture: Secondary | ICD-10-CM | POA: Diagnosis present

## 2021-07-06 DIAGNOSIS — S0081XA Abrasion of other part of head, initial encounter: Secondary | ICD-10-CM

## 2021-07-06 DIAGNOSIS — S82291D Other fracture of shaft of right tibia, subsequent encounter for closed fracture with routine healing: Secondary | ICD-10-CM | POA: Diagnosis not present

## 2021-07-06 DIAGNOSIS — S0990XA Unspecified injury of head, initial encounter: Secondary | ICD-10-CM

## 2021-07-06 DIAGNOSIS — T1490XA Injury, unspecified, initial encounter: Secondary | ICD-10-CM | POA: Diagnosis not present

## 2021-07-06 DIAGNOSIS — K5901 Slow transit constipation: Secondary | ICD-10-CM | POA: Diagnosis not present

## 2021-07-06 HISTORY — DX: Other specified diseases of liver: K76.89

## 2021-07-06 HISTORY — DX: Restless legs syndrome: G25.81

## 2021-07-06 HISTORY — DX: Other chronic pain: G89.29

## 2021-07-06 HISTORY — DX: Cyst of kidney, acquired: N28.1

## 2021-07-06 HISTORY — PX: TIBIA IM NAIL INSERTION: SHX2516

## 2021-07-06 LAB — CBC
HCT: 45.3 % (ref 39.0–52.0)
Hemoglobin: 14.7 g/dL (ref 13.0–17.0)
MCH: 32 pg (ref 26.0–34.0)
MCHC: 32.5 g/dL (ref 30.0–36.0)
MCV: 98.5 fL (ref 80.0–100.0)
Platelets: 159 10*3/uL (ref 150–400)
RBC: 4.6 MIL/uL (ref 4.22–5.81)
RDW: 12.4 % (ref 11.5–15.5)
WBC: 11.5 10*3/uL — ABNORMAL HIGH (ref 4.0–10.5)
nRBC: 0 % (ref 0.0–0.2)

## 2021-07-06 LAB — BASIC METABOLIC PANEL
Anion gap: 10 (ref 5–15)
BUN: 15 mg/dL (ref 8–23)
CO2: 26 mmol/L (ref 22–32)
Calcium: 9.3 mg/dL (ref 8.9–10.3)
Chloride: 103 mmol/L (ref 98–111)
Creatinine, Ser: 0.95 mg/dL (ref 0.61–1.24)
GFR, Estimated: >60 mL/min (ref >60)
Glucose, Bld: 126 mg/dL — ABNORMAL HIGH (ref 70–99)
Potassium: 4.4 mmol/L (ref 3.5–5.1)
Sodium: 139 mmol/L (ref 135–145)

## 2021-07-06 LAB — CBC WITH DIFFERENTIAL/PLATELET
Abs Immature Granulocytes: 0.09 10*3/uL — ABNORMAL HIGH (ref 0.00–0.07)
Basophils Absolute: 0 10*3/uL (ref 0.0–0.1)
Basophils Relative: 1 %
Eosinophils Absolute: 0.2 10*3/uL (ref 0.0–0.5)
Eosinophils Relative: 2 %
HCT: 49.1 % (ref 39.0–52.0)
Hemoglobin: 16.7 g/dL (ref 13.0–17.0)
Immature Granulocytes: 1 %
Lymphocytes Relative: 18 %
Lymphs Abs: 1.6 10*3/uL (ref 0.7–4.0)
MCH: 33.5 pg (ref 26.0–34.0)
MCHC: 34 g/dL (ref 30.0–36.0)
MCV: 98.4 fL (ref 80.0–100.0)
Monocytes Absolute: 0.5 10*3/uL (ref 0.1–1.0)
Monocytes Relative: 6 %
Neutro Abs: 6.4 10*3/uL (ref 1.7–7.7)
Neutrophils Relative %: 72 %
Platelets: 170 10*3/uL (ref 150–400)
RBC: 4.99 MIL/uL (ref 4.22–5.81)
RDW: 12.6 % (ref 11.5–15.5)
WBC: 8.8 10*3/uL (ref 4.0–10.5)
nRBC: 0 % (ref 0.0–0.2)

## 2021-07-06 LAB — TYPE AND SCREEN
ABO/RH(D): O POS
Antibody Screen: NEGATIVE

## 2021-07-06 LAB — SURGICAL PCR SCREEN
MRSA, PCR: NEGATIVE
Staphylococcus aureus: NEGATIVE

## 2021-07-06 LAB — CREATININE, SERUM
Creatinine, Ser: 1.07 mg/dL (ref 0.61–1.24)
GFR, Estimated: >60 mL/min (ref >60)

## 2021-07-06 LAB — ABO/RH: ABO/RH(D): O POS

## 2021-07-06 SURGERY — INSERTION, INTRAMEDULLARY ROD, TIBIA
Anesthesia: General | Laterality: Right

## 2021-07-06 MED ORDER — LEVOTHYROXINE SODIUM 50 MCG PO TABS
50.0000 ug | ORAL_TABLET | Freq: Every day | ORAL | Status: DC
  Administered 2021-07-07 – 2021-07-12 (×6): 50 ug via ORAL
  Filled 2021-07-06 (×6): qty 1

## 2021-07-06 MED ORDER — CEFAZOLIN SODIUM-DEXTROSE 1-4 GM/50ML-% IV SOLN
1.0000 g | Freq: Four times a day (QID) | INTRAVENOUS | Status: AC
  Administered 2021-07-07 (×3): 1 g via INTRAVENOUS
  Filled 2021-07-06 (×5): qty 50

## 2021-07-06 MED ORDER — FLUORESCEIN SODIUM 1 MG OP STRP
1.0000 | ORAL_STRIP | Freq: Once | OPHTHALMIC | Status: AC
Start: 2021-07-06 — End: 2021-07-06
  Administered 2021-07-06: 1 via OPHTHALMIC
  Filled 2021-07-06: qty 1

## 2021-07-06 MED ORDER — CEFAZOLIN SODIUM-DEXTROSE 1-4 GM/50ML-% IV SOLN
1.0000 g | Freq: Once | INTRAVENOUS | Status: AC
  Administered 2021-07-06: 1 g via INTRAVENOUS
  Filled 2021-07-06: qty 50

## 2021-07-06 MED ORDER — LIDOCAINE 2% (20 MG/ML) 5 ML SYRINGE
INTRAMUSCULAR | Status: AC
  Filled 2021-07-06: qty 5

## 2021-07-06 MED ORDER — CEFAZOLIN SODIUM-DEXTROSE 2-4 GM/100ML-% IV SOLN
INTRAVENOUS | Status: AC
  Filled 2021-07-06: qty 100

## 2021-07-06 MED ORDER — 0.9 % SODIUM CHLORIDE (POUR BTL) OPTIME
TOPICAL | Status: DC | PRN
  Administered 2021-07-06: 1000 mL

## 2021-07-06 MED ORDER — OXYCODONE HCL 5 MG PO TABS
5.0000 mg | ORAL_TABLET | ORAL | Status: DC | PRN
  Administered 2021-07-10: 10 mg via ORAL
  Administered 2021-07-11: 5 mg via ORAL
  Filled 2021-07-06: qty 2
  Filled 2021-07-06 (×2): qty 1

## 2021-07-06 MED ORDER — DOCUSATE SODIUM 100 MG PO CAPS
100.0000 mg | ORAL_CAPSULE | Freq: Two times a day (BID) | ORAL | Status: DC
  Administered 2021-07-06 – 2021-07-12 (×12): 100 mg via ORAL
  Filled 2021-07-06 (×12): qty 1

## 2021-07-06 MED ORDER — FENTANYL CITRATE (PF) 100 MCG/2ML IJ SOLN
25.0000 ug | INTRAMUSCULAR | Status: DC | PRN
  Administered 2021-07-06 (×4): 25 ug via INTRAVENOUS

## 2021-07-06 MED ORDER — ONDANSETRON HCL 4 MG/2ML IJ SOLN: 4.0000 mg | Freq: Four times a day (QID) | INTRAMUSCULAR | Status: DC | PRN

## 2021-07-06 MED ORDER — ONDANSETRON HCL 4 MG/2ML IJ SOLN
INTRAMUSCULAR | Status: DC | PRN
Start: 2021-07-06 — End: 2021-07-06
  Administered 2021-07-06: 4 mg via INTRAVENOUS

## 2021-07-06 MED ORDER — LACTATED RINGERS IV SOLN: INTRAVENOUS | Status: AC

## 2021-07-06 MED ORDER — FENTANYL CITRATE (PF) 250 MCG/5ML IJ SOLN
INTRAMUSCULAR | Status: AC
  Filled 2021-07-06: qty 5

## 2021-07-06 MED ORDER — POVIDONE-IODINE 10 % EX SWAB: 2.0000 "application " | Freq: Once | CUTANEOUS | Status: DC

## 2021-07-06 MED ORDER — ROCURONIUM BROMIDE 10 MG/ML (PF) SYRINGE
PREFILLED_SYRINGE | INTRAVENOUS | Status: AC
  Filled 2021-07-06: qty 10

## 2021-07-06 MED ORDER — ONDANSETRON HCL 4 MG/2ML IJ SOLN
4.0000 mg | Freq: Once | INTRAMUSCULAR | Status: AC
  Administered 2021-07-06: 4 mg via INTRAVENOUS
  Filled 2021-07-06: qty 2

## 2021-07-06 MED ORDER — ACETAMINOPHEN 500 MG PO TABS
ORAL_TABLET | ORAL | Status: AC
  Administered 2021-07-06: 1000 mg via ORAL
  Filled 2021-07-06: qty 2

## 2021-07-06 MED ORDER — CHLORHEXIDINE GLUCONATE 0.12 % MT SOLN
15.0000 mL | Freq: Once | OROMUCOSAL | Status: AC
  Administered 2021-07-06: 15 mL via OROMUCOSAL

## 2021-07-06 MED ORDER — TETANUS-DIPHTH-ACELL PERTUSSIS 5-2.5-18.5 LF-MCG/0.5 IM SUSY
0.5000 mL | PREFILLED_SYRINGE | Freq: Once | INTRAMUSCULAR | Status: DC
  Filled 2021-07-06: qty 0.5

## 2021-07-06 MED ORDER — METOCLOPRAMIDE HCL 5 MG PO TABS: 5.0000 mg | ORAL_TABLET | Freq: Three times a day (TID) | ORAL | Status: DC | PRN

## 2021-07-06 MED ORDER — PHENYLEPHRINE 40 MCG/ML (10ML) SYRINGE FOR IV PUSH (FOR BLOOD PRESSURE SUPPORT)
PREFILLED_SYRINGE | INTRAVENOUS | Status: DC | PRN
  Administered 2021-07-06: 80 ug via INTRAVENOUS

## 2021-07-06 MED ORDER — ADULT MULTIVITAMIN W/MINERALS CH
1.0000 | ORAL_TABLET | Freq: Every morning | ORAL | Status: DC
  Administered 2021-07-07 – 2021-07-12 (×6): 1 via ORAL
  Filled 2021-07-06 (×6): qty 1

## 2021-07-06 MED ORDER — DEXAMETHASONE SODIUM PHOSPHATE 10 MG/ML IJ SOLN
INTRAMUSCULAR | Status: AC
  Filled 2021-07-06: qty 1

## 2021-07-06 MED ORDER — ACETAMINOPHEN 500 MG PO TABS
1000.0000 mg | ORAL_TABLET | Freq: Three times a day (TID) | ORAL | Status: DC
  Administered 2021-07-06 – 2021-07-12 (×17): 1000 mg via ORAL
  Filled 2021-07-06 (×17): qty 2

## 2021-07-06 MED ORDER — LACTATED RINGERS IV SOLN: INTRAVENOUS | Status: DC

## 2021-07-06 MED ORDER — PRAMIPEXOLE DIHYDROCHLORIDE 0.125 MG PO TABS
0.1250 mg | ORAL_TABLET | Freq: Every day | ORAL | Status: DC
  Administered 2021-07-06 – 2021-07-07 (×2): 0.125 mg via ORAL
  Filled 2021-07-06 (×2): qty 1

## 2021-07-06 MED ORDER — ONDANSETRON HCL 4 MG PO TABS
4.0000 mg | ORAL_TABLET | Freq: Four times a day (QID) | ORAL | Status: DC | PRN
Start: 2021-07-06 — End: 2021-07-12
  Administered 2021-07-10: 4 mg via ORAL
  Filled 2021-07-06: qty 1

## 2021-07-06 MED ORDER — MAGNESIUM OXIDE -MG SUPPLEMENT 400 (240 MG) MG PO TABS
400.0000 mg | ORAL_TABLET | Freq: Every morning | ORAL | Status: DC
  Administered 2021-07-07 – 2021-07-12 (×6): 400 mg via ORAL
  Filled 2021-07-06 (×5): qty 1

## 2021-07-06 MED ORDER — CHLORHEXIDINE GLUCONATE 4 % EX LIQD: 60.0000 mL | Freq: Once | CUTANEOUS | Status: DC

## 2021-07-06 MED ORDER — EPHEDRINE SULFATE-NACL 50-0.9 MG/10ML-% IV SOSY
PREFILLED_SYRINGE | INTRAVENOUS | Status: DC | PRN
Start: 2021-07-06 — End: 2021-07-06
  Administered 2021-07-06: 10 mg via INTRAVENOUS

## 2021-07-06 MED ORDER — PROPOFOL 10 MG/ML IV BOLUS
INTRAVENOUS | Status: AC
Start: 2021-07-06 — End: ?
  Filled 2021-07-06: qty 20

## 2021-07-06 MED ORDER — MORPHINE SULFATE (PF) 2 MG/ML IV SOLN
2.0000 mg | Freq: Once | INTRAVENOUS | Status: AC
  Administered 2021-07-06: 2 mg via INTRAVENOUS
  Filled 2021-07-06: qty 1

## 2021-07-06 MED ORDER — ENOXAPARIN SODIUM 40 MG/0.4ML IJ SOSY
40.0000 mg | PREFILLED_SYRINGE | INTRAMUSCULAR | Status: DC
  Administered 2021-07-07 – 2021-07-12 (×6): 40 mg via SUBCUTANEOUS
  Filled 2021-07-06 (×6): qty 0.4

## 2021-07-06 MED ORDER — TETANUS-DIPHTH-ACELL PERTUSSIS 5-2.5-18.5 LF-MCG/0.5 IM SUSY
0.5000 mL | PREFILLED_SYRINGE | Freq: Once | INTRAMUSCULAR | Status: AC
  Administered 2021-07-06: 0.5 mL via INTRAMUSCULAR
  Filled 2021-07-06: qty 0.5

## 2021-07-06 MED ORDER — TIZANIDINE HCL 2 MG PO TABS
2.0000 mg | ORAL_TABLET | Freq: Once | ORAL | Status: AC
Start: 2021-07-06 — End: 2021-07-06
  Administered 2021-07-06: 2 mg via ORAL
  Filled 2021-07-06: qty 1

## 2021-07-06 MED ORDER — ROCURONIUM BROMIDE 10 MG/ML (PF) SYRINGE
PREFILLED_SYRINGE | INTRAVENOUS | Status: DC | PRN
  Administered 2021-07-06: 10 mg via INTRAVENOUS
  Administered 2021-07-06: 60 mg via INTRAVENOUS
  Administered 2021-07-06: 10 mg via INTRAVENOUS

## 2021-07-06 MED ORDER — KETOROLAC TROMETHAMINE 15 MG/ML IJ SOLN
7.5000 mg | Freq: Four times a day (QID) | INTRAMUSCULAR | Status: AC
  Administered 2021-07-07 – 2021-07-09 (×12): 7.5 mg via INTRAVENOUS
  Filled 2021-07-06 (×12): qty 1

## 2021-07-06 MED ORDER — PHENYLEPHRINE 40 MCG/ML (10ML) SYRINGE FOR IV PUSH (FOR BLOOD PRESSURE SUPPORT)
PREFILLED_SYRINGE | INTRAVENOUS | Status: AC
  Filled 2021-07-06: qty 10

## 2021-07-06 MED ORDER — METOCLOPRAMIDE HCL 5 MG/ML IJ SOLN: 5.0000 mg | Freq: Three times a day (TID) | INTRAMUSCULAR | Status: DC | PRN

## 2021-07-06 MED ORDER — FENTANYL CITRATE (PF) 250 MCG/5ML IJ SOLN
INTRAMUSCULAR | Status: DC | PRN
  Administered 2021-07-06 (×2): 50 ug via INTRAVENOUS

## 2021-07-06 MED ORDER — CEFAZOLIN SODIUM-DEXTROSE 2-4 GM/100ML-% IV SOLN
2.0000 g | INTRAVENOUS | Status: AC
  Administered 2021-07-06: 2 g via INTRAVENOUS

## 2021-07-06 MED ORDER — TETRACAINE HCL 0.5 % OP SOLN
2.0000 [drp] | Freq: Once | OPHTHALMIC | Status: AC
  Administered 2021-07-06: 2 [drp] via OPHTHALMIC
  Filled 2021-07-06: qty 4

## 2021-07-06 MED ORDER — ARTIFICIAL TEARS OPHTHALMIC OINT
TOPICAL_OINTMENT | OPHTHALMIC | Status: DC | PRN
  Administered 2021-07-06: 1 via OPHTHALMIC

## 2021-07-06 MED ORDER — SUGAMMADEX SODIUM 200 MG/2ML IV SOLN
INTRAVENOUS | Status: DC | PRN
  Administered 2021-07-06: 200 mg via INTRAVENOUS

## 2021-07-06 MED ORDER — ARTIFICIAL TEARS OPHTHALMIC OINT
TOPICAL_OINTMENT | OPHTHALMIC | Status: AC
  Filled 2021-07-06: qty 3.5

## 2021-07-06 MED ORDER — DEXAMETHASONE SODIUM PHOSPHATE 10 MG/ML IJ SOLN
INTRAMUSCULAR | Status: DC | PRN
  Administered 2021-07-06: 5 mg via INTRAVENOUS

## 2021-07-06 MED ORDER — ACETAMINOPHEN 500 MG PO TABS: 1000.0000 mg | ORAL_TABLET | Freq: Once | ORAL | Status: AC

## 2021-07-06 MED ORDER — HYDROCODONE-ACETAMINOPHEN 5-325 MG PO TABS: 1.0000 | ORAL_TABLET | Freq: Four times a day (QID) | ORAL | Status: DC | PRN

## 2021-07-06 MED ORDER — LIDOCAINE 2% (20 MG/ML) 5 ML SYRINGE
INTRAMUSCULAR | Status: DC | PRN
  Administered 2021-07-06: 20 mg via INTRAVENOUS

## 2021-07-06 MED ORDER — FENTANYL CITRATE (PF) 100 MCG/2ML IJ SOLN
INTRAMUSCULAR | Status: AC
  Filled 2021-07-06: qty 2

## 2021-07-06 MED ORDER — ONDANSETRON HCL 4 MG/2ML IJ SOLN
INTRAMUSCULAR | Status: AC
  Filled 2021-07-06: qty 2

## 2021-07-06 MED ORDER — DIAZEPAM 5 MG PO TABS
5.0000 mg | ORAL_TABLET | Freq: Once | ORAL | Status: AC
Start: 2021-07-06 — End: 2021-07-06
  Administered 2021-07-06: 5 mg via ORAL
  Filled 2021-07-06: qty 1

## 2021-07-06 MED ORDER — SENNOSIDES-DOCUSATE SODIUM 8.6-50 MG PO TABS
1.0000 | ORAL_TABLET | Freq: Every evening | ORAL | Status: DC | PRN
  Administered 2021-07-11: 1 via ORAL
  Filled 2021-07-06: qty 1

## 2021-07-06 MED ORDER — PROPOFOL 10 MG/ML IV BOLUS
INTRAVENOUS | Status: DC | PRN
  Administered 2021-07-06: 20 mg via INTRAVENOUS
  Administered 2021-07-06: 30 mg via INTRAVENOUS
  Administered 2021-07-06: 100 mg via INTRAVENOUS
  Administered 2021-07-06: 20 mg via INTRAVENOUS

## 2021-07-06 MED ORDER — ORAL CARE MOUTH RINSE: 15.0000 mL | Freq: Once | OROMUCOSAL | Status: AC

## 2021-07-06 MED ORDER — MORPHINE SULFATE (PF) 2 MG/ML IV SOLN: 1.0000 mg | INTRAVENOUS | Status: DC | PRN

## 2021-07-06 SURGICAL SUPPLY — 58 items
BAG COUNTER SPONGE SURGICOUNT (BAG) ×2 IMPLANT
BAG SPNG CNTER NS LX DISP (BAG) ×1
BIT DRILL 3.8X6 NS (BIT) ×1 IMPLANT
BIT DRILL 4.4 NS (BIT) ×1 IMPLANT
BLADE SURG 10 STRL SS (BLADE) ×2 IMPLANT
BNDG ELASTIC 4X5.8 VLCR STR LF (GAUZE/BANDAGES/DRESSINGS) ×2 IMPLANT
BNDG ELASTIC 6X5.8 VLCR STR LF (GAUZE/BANDAGES/DRESSINGS) ×2 IMPLANT
BNDG GAUZE ELAST 4 BULKY (GAUZE/BANDAGES/DRESSINGS) ×2 IMPLANT
BRUSH SCRUB EZ PLAIN DRY (MISCELLANEOUS) ×4 IMPLANT
COVER SURGICAL LIGHT HANDLE (MISCELLANEOUS) ×4 IMPLANT
DRAPE C-ARM 42X72 X-RAY (DRAPES) ×2 IMPLANT
DRAPE C-ARMOR (DRAPES) ×2 IMPLANT
DRAPE HALF SHEET 40X57 (DRAPES) IMPLANT
DRAPE INCISE IOBAN 66X45 STRL (DRAPES) IMPLANT
DRAPE U-SHAPE 47X51 STRL (DRAPES) ×2 IMPLANT
DRSG ADAPTIC 3X8 NADH LF (GAUZE/BANDAGES/DRESSINGS) ×1 IMPLANT
DRSG MEPITEL 4X7.2 (GAUZE/BANDAGES/DRESSINGS) ×2 IMPLANT
DRSG PAD ABDOMINAL 8X10 ST (GAUZE/BANDAGES/DRESSINGS) ×2 IMPLANT
ELECT REM PT RETURN 9FT ADLT (ELECTROSURGICAL) ×2
ELECTRODE REM PT RTRN 9FT ADLT (ELECTROSURGICAL) ×1 IMPLANT
GAUZE SPONGE 4X4 12PLY STRL (GAUZE/BANDAGES/DRESSINGS) ×2 IMPLANT
GLOVE SRG 8 PF TXTR STRL LF DI (GLOVE) ×1 IMPLANT
GLOVE SURG ENC MOIS LTX SZ8 (GLOVE) ×2 IMPLANT
GLOVE SURG ENC MOIS LTX SZ8.5 (GLOVE) ×2 IMPLANT
GLOVE SURG ORTHO LTX SZ7.5 (GLOVE) ×4 IMPLANT
GLOVE SURG UNDER POLY LF SZ7.5 (GLOVE) ×2 IMPLANT
GLOVE SURG UNDER POLY LF SZ8 (GLOVE) ×2
GOWN STRL REUS W/ TWL LRG LVL3 (GOWN DISPOSABLE) ×2 IMPLANT
GOWN STRL REUS W/ TWL XL LVL3 (GOWN DISPOSABLE) ×1 IMPLANT
GOWN STRL REUS W/TWL LRG LVL3 (GOWN DISPOSABLE) ×4
GOWN STRL REUS W/TWL XL LVL3 (GOWN DISPOSABLE) ×2
GUIDE ROD 3.0 (MISCELLANEOUS) ×2
KIT BASIN OR (CUSTOM PROCEDURE TRAY) ×2 IMPLANT
KIT TURNOVER KIT B (KITS) ×2 IMPLANT
NAIL TIBIA 9X39.0 (Nail) ×1 IMPLANT
PACK ORTHO EXTREMITY (CUSTOM PROCEDURE TRAY) ×2 IMPLANT
PAD ABD 8X10 STRL (GAUZE/BANDAGES/DRESSINGS) ×1 IMPLANT
PAD ARMBOARD 7.5X6 YLW CONV (MISCELLANEOUS) ×4 IMPLANT
PAD CAST 4YDX4 CTTN HI CHSV (CAST SUPPLIES) ×1 IMPLANT
PADDING CAST COTTON 4X4 STRL (CAST SUPPLIES) ×2
PADDING CAST COTTON 6X4 STRL (CAST SUPPLIES) ×2 IMPLANT
ROD GUIDE 3.0 (MISCELLANEOUS) IMPLANT
SCREW ACECAP 44MM (Screw) ×1 IMPLANT
SCREW ACECAP 52MM (Screw) ×1 IMPLANT
SCREW ACECAP 56MM (Screw) ×1 IMPLANT
SCREW LCKING CORT 5.5X80 NSTRL (Screw) ×1 IMPLANT
SCREW PROXIMAL DEPUY (Screw) ×2 IMPLANT
SCREW PRXML FT 60X5.5XNS LF (Screw) IMPLANT
SPONGE T-LAP 18X18 ~~LOC~~+RFID (SPONGE) ×2 IMPLANT
STAPLER VISISTAT 35W (STAPLE) ×2 IMPLANT
SUT ETHILON 2 0 FS 18 (SUTURE) ×4 IMPLANT
SUT VIC AB 0 CT1 27 (SUTURE) ×2
SUT VIC AB 0 CT1 27XBRD ANBCTR (SUTURE) IMPLANT
SUT VIC AB 2-0 CT1 27 (SUTURE) ×4
SUT VIC AB 2-0 CT1 TAPERPNT 27 (SUTURE) ×1 IMPLANT
TOWEL GREEN STERILE (TOWEL DISPOSABLE) ×4 IMPLANT
TOWEL GREEN STERILE FF (TOWEL DISPOSABLE) ×2 IMPLANT
YANKAUER SUCT BULB TIP NO VENT (SUCTIONS) IMPLANT

## 2021-07-06 NOTE — ED Notes (Signed)
Pt.'s family updated.
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
## 2021-07-06 NOTE — ED Notes (Signed)
Report given to short stay RN  

## 2021-07-06 NOTE — Progress Notes (Signed)
Orthopedic Tech Progress Note ?Patient Details:  ?Edward Waller ?[birth year] ?[id number] ? ?Ortho Devices ?Type of Ortho Device: Short leg splint ?Ortho Device/Splint Location: RLE ?Ortho Device/Splint Interventions: Ordered, Application ?  ?Post Interventions ?Patient Tolerated: Well ? ?Burhan Barham A Vincenza Dail ?07/06/2021, 2:59 PM ? ?

## 2021-07-06 NOTE — Assessment & Plan Note (Addendum)
Minimally displaced mid sternal fracture with underlying sclerotic lesion suggesting this is a pathologic fracture. Recommendation for outpatient workup. ?

## 2021-07-06 NOTE — Anesthesia Procedure Notes (Signed)
Procedure Name: Intubation ?Date/Time: 07/06/2021 4:31 PM ?Performed by: Myna Bright, CRNA ?Pre-anesthesia Checklist: Patient identified, Emergency Drugs available, Suction available and Patient being monitored ?Patient Re-evaluated:Patient Re-evaluated prior to induction ?Oxygen Delivery Method: Circle system utilized ?Preoxygenation: Pre-oxygenation with 100% oxygen ?Induction Type: IV induction ?Ventilation: Mask ventilation without difficulty ?Laryngoscope Size: Mac and 4 ?Grade View: Grade I ?Tube type: Oral ?Tube size: 7.5 mm ?Number of attempts: 1 ?Airway Equipment and Method: Stylet ?Placement Confirmation: ETT inserted through vocal cords under direct vision, positive ETCO2 and breath sounds checked- equal and bilateral ?Secured at: 22 cm ?Tube secured with: Tape ?Comments: Easy mask airway. DL x 1. Moderate amount of blood in oropharynx d/t eye injury. Suctioned blood, grade 1 view. Atraumatic oral intubation.  ? ? ? ? ?

## 2021-07-06 NOTE — Anesthesia Preprocedure Evaluation (Addendum)
Anesthesia Evaluation  ?Patient identified by MRN, date of birth, ID band ?Patient awake ? ? ? ?History of Anesthesia Complications ?Negative for: history of anesthetic complications ? ?Airway ?Mallampati: I ? ?TM Distance: >3 FB ?Neck ROM: Full ? ? ? Dental ? ?(+) Edentulous Upper, Edentulous Lower, Lower Dentures, Upper Dentures ?  ?Pulmonary ?neg pulmonary ROS,  ?  ?breath sounds clear to auscultation ? ? ? ? ? ? Cardiovascular ?negative cardio ROS ? ? ?Rhythm:Regular Rate:Normal ? ? ?  ?Neuro/Psych ?Restless legs ?  ? GI/Hepatic ?negative GI ROS, Neg liver ROS,   ?Endo/Other  ?Hypothyroidism  ? Renal/GU ?negative Renal ROS  ? ?  ?Musculoskeletal ? ? Abdominal ?  ?Peds ? Hematology ?negative hematology ROS ?(+)   ?Anesthesia Other Findings ? ? Reproductive/Obstetrics ? ?  ? ? ? ? ? ? ? ? ? ? ? ? ? ?  ?  ? ? ? ? ? ? ? ?Anesthesia Physical ?Anesthesia Plan ? ?ASA: 3 ? ?Anesthesia Plan: General  ? ?Post-op Pain Management: Tylenol PO (pre-op)*  ? ?Induction: Intravenous ? ?PONV Risk Score and Plan: 2 and Ondansetron and Dexamethasone ? ?Airway Management Planned: Oral ETT ? ?Additional Equipment: None ? ?Intra-op Plan:  ? ?Post-operative Plan: Extubation in OR ? ?Informed Consent: I have reviewed the patients History and Physical, chart, labs and discussed the procedure including the risks, benefits and alternatives for the proposed anesthesia with the patient or authorized representative who has indicated his/her understanding and acceptance.  ? ?Patient has DNR.  ? ?Dental advisory given ? ?Plan Discussed with: CRNA and Surgeon ? ?Anesthesia Plan Comments:   ? ? ? ? ? ?Anesthesia Quick Evaluation ? ?
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
## 2021-07-06 NOTE — Assessment & Plan Note (Addendum)
-  Switch to Requip and increase to 1 mg. Patient states he does not take Mirapex; will need to discontinue on discharge ?

## 2021-07-06 NOTE — Assessment & Plan Note (Addendum)
Displaced right inferior orbital wall fracture with layering blood in right maxillary sinus. Abutment of the inferior rectus muscle with fx posteriorly and medially, but no definitive entrapment. Ophthalmology consulted and have recommended ice pack for 20 min q1-2 hours for 24-48 hours (end today) and outpatient follow-up with outpatient ophthalmologist in 2-3 weeks. Polysporin prescribed for right upper eyelid abrasion. ?

## 2021-07-06 NOTE — Transfer of Care (Signed)
Immediate Anesthesia Transfer of Care Note ? ?Patient: Edward Waller ? ?Procedure(s) Performed: INTRAMEDULLARY (IM) NAIL TIBIAL (Right) ? ?Patient Location: PACU ? ?Anesthesia Type:General ? ?Level of Consciousness: awake, alert , oriented and patient cooperative ? ?Airway & Oxygen Therapy: Patient Spontanous Breathing and Patient connected to face mask oxygen ? ?Post-op Assessment: Report given to RN, Post -op Vital signs reviewed and stable and Patient moving all extremities ? ?Post vital signs: Reviewed and stable ? ?Last Vitals:  ?Vitals Value Taken Time  ?BP 136/72 07/06/21 1819  ?Temp 37.3 ?C 07/06/21 1819  ?Pulse 74 07/06/21 1820  ?Resp 19 07/06/21 1821  ?SpO2 93 % 07/06/21 1820  ?Vitals shown include unvalidated device data. ? ?Last Pain:  ?Vitals:  ? 07/06/21 1542  ?TempSrc: Oral  ?PainSc:   ?   ? ?  ? ?Complications: No notable events documented. ?
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
## 2021-07-06 NOTE — Assessment & Plan Note (Addendum)
-  Continue home Synthroid  50 mcg daily ?

## 2021-07-06 NOTE — ED Notes (Signed)
Level 2 activated per Dr. Pearline Cables.  ?
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
## 2021-07-06 NOTE — ED Notes (Signed)
Vital signs stable. 

## 2021-07-06 NOTE — Progress Notes (Signed)
Pt's wife [place] notified he has gone to surgery. ?

## 2021-07-06 NOTE — Anesthesia Postprocedure Evaluation (Signed)
Anesthesia Post Note ? ?Patient: Edward Waller ? ?Procedure(s) Performed: INTRAMEDULLARY (IM) NAIL TIBIAL (Right) ? ?  ? ?Patient location during evaluation: PACU ?Anesthesia Type: General ?Level of consciousness: awake and alert ?Pain management: pain level controlled ?Vital Signs Assessment: post-procedure vital signs reviewed and stable ?Respiratory status: spontaneous breathing, nonlabored ventilation, respiratory function stable and patient connected to nasal cannula oxygen ?Cardiovascular status: blood pressure returned to baseline and stable ?Postop Assessment: no apparent nausea or vomiting ?Anesthetic complications: no ? ? ?No notable events documented. ? ?Last Vitals:  ?Vitals:  ? 07/06/21 1834 07/06/21 1849  ?BP: (!) 155/76 (!) 158/91  ?Pulse: 70 74  ?Resp: 15 16  ?Temp:    ?SpO2: 94% 98%  ?  ?Last Pain:  ?Vitals:  ? 07/06/21 1819  ?TempSrc:   ?PainSc: 10-Worst pain ever  ? ? ?  ?  ?  ?  ?  ?  ? ?[place] ? ? ? ? ?
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
## 2021-07-06 NOTE — H&P (Signed)
?History and Physical  ? ? ?Patient: Edward Waller WSF:[id number] DOB: 08-Jan-1933 ?DOA: 07/06/2021 ?DOS: the patient was seen and examined on 07/06/2021 ?PCP: Riki Sheer, NP  ?Patient coming from: Home - lives with his wife.  ? ? ?Chief Complaint: fall with right leg pain and head trauma  ? ?HPI: Edward Waller is a 88 y.o. male with medical history significant of hypothyroidism, catacracts, BPH who presents to ED after getting struck by arm lever of a garbage truck.  He states he bent over to get a trash can and when he went to stand up the arm lever of the truck came down and hit him and caused him to fall underneath the arm. He had immediate pain in his right hip and he was unable to stand up.  His eye and head were bleeding. His right leg was also turned out. He never lost consciousness. He denies any vision changes or pain with eye movement.  ? ?He overall has been feeling well and doing fine. Denies any fever/chills, no headaches/vision changes, no chest pain or palpitations, no shortness of breath or cough, no stomach pain, no N/V/D, no dysuria and no leg swelling.  ? ? ?ER Course:  vitals: temp: 97.5, bp: 190/91, HR: 84, RR: 12, oxygen: 98%RA ?Pertinent labs: none ?CT maxillofacial: Displaced right inferior orbital wall fracture with layering blood products in the right maxillary sinus. There is abutment of the ?inferior rectus muscle with a fracture posteriorly and medially, but ?no definitive entrapment by CT, correlate with exam. ?CT thoracic spine: ? Pathological sternal fracture  ?Right tib/fib: tibial and fibula fx. Displaced tibial fracture. Fibular fx comminuted, mildly displaced.  ? ?In ED: ortho consulted. Started on IVF.  ? ? ? ?Review of Systems: As mentioned in the history of present illness. All other systems reviewed and are negative. ?Past Medical History:  ?Diagnosis Date  ? Cataract   ? bilateral  ? Fx ankle   ? left; Dec 2021  ? 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   ?  Spinal stenosis   ? Thyroid disease   ? ?Past Surgical History:  ?Procedure Laterality Date  ? BACK SURGERY    ? ?Social History:  reports that he has never smoked. He has never used smokeless tobacco. He reports that he does not drink alcohol and does not use drugs. ? ?No Known Allergies ? ?History reviewed. No pertinent family history. ? ?Prior to Admission medications   ?Medication Sig Start Date End Date Taking? Authorizing Provider  ?Flaxseed, Linseed, (FLAX SEED OIL PO) Take by mouth.    [provider]  ?levothyroxine (SYNTHROID) 50 MCG tablet Take 50 mcg by mouth daily before breakfast.    [provider]  ?Multiple Vitamin (MULTIVITAMIN) capsule Take 1 capsule by mouth daily.    [provider]  ?rOPINIRole (REQUIP) 1 MG tablet Take 1 tablet (1 mg total) by mouth at bedtime as needed. 05/16/20   Noreene Larsson, NP  ?Saw Palmetto, Serenoa repens, (SAW PALMETTO PO) Take by mouth.    [provider]  ? ? ?Physical Exam: ?Vitals:  ? 07/06/21 1305 07/06/21 1345 07/06/21 1400 07/06/21 1542  ?BP: (!) 144/94 (!) 153/44 (!) 163/91   ?Pulse: 69 68 76 90  ?Resp: (!) '28 14 16 18  '$ ?Temp:    98.2 ?F (36.8 ?C)  ?TempSrc:    Oral  ?SpO2: 100% 100% 100% 100%  ?Weight:      ?Height:      ? ?General:  Appears calm and comfortable and is in NAD. Large open laceration to right frontal bone ?Eyes:  PERRL, EOMI, normal lids, iris. . Can move eyes upward. Painful, but no nausea or blurry vision.  ?ENT:  grossly normal hearing, lips & tongue, mmm; appropriate dentition ?Neck:  no LAD, masses or thyromegaly; no carotid bruits ?Cardiovascular:  RRR, no m/r/g. No LE edema.  ?Respiratory:   CTA bilaterally with no wheezes/rales/rhonchi.  Normal respiratory effort. ?Abdomen:  soft, NT, ND, NABS ?Back:   normal alignment, no CVAT ?Skin:  eye laceration per above  ?Musculoskeletal:  grossly normal tone BUE and left LE, ?Right LE: turned outward and wrapped. Can wiggle toes and has sensation. No edema in  ankle. 2+pedal pulses.  ?Lower extremity:  No LE edema.  Limited foot exam with no ulcerations.  2+ distal pulses. ?Psychiatric:  grossly normal mood and affect, speech fluent and appropriate, AOx3 ?Neurologic:  CN 2-12 grossly intact, moves all extremities in coordinated fashion, sensation intact ? ? ?Radiological Exams on Admission: ?Independently reviewed - see discussion in A/P where applicable ? ?DG Knee 1-2 Views Right ? ?Result Date: 07/06/2021 ?CLINICAL DATA:  Patient hit on the right side by a garbage truck. Right lower extremity pain. EXAM: RIGHT KNEE - 1-2 VIEW COMPARISON:  None. FINDINGS: Comminuted mildly displaced fracture of the proximal fibular shaft, incompletely visualized. No other fractures. Knee joint normally spaced and aligned. No significant degenerative/arthropathic changes. No joint effusion. Oval soft tissue prominence along the posterior aspect of the knee may reflect a popliteal cyst. IMPRESSION: 1. Comminuted, mildly displaced fracture of the proximal right fibula. This is further described under the right tibia and fibular radiographs. 2. No other fractures.  Knee joint normally aligned. 3. Possible popliteal cyst. Electronically Signed   By: Lajean Manes M.D.   On: 07/06/2021 13:20  ? ?DG Tibia/Fibula Right ? ?Result Date: 07/06/2021 ?CLINICAL DATA:  Patient hit on the right side by a garbage truck. Right lower extremity pain. EXAM: RIGHT TIBIA AND FIBULA - 2 VIEW COMPARISON:  None. FINDINGS: There are fractures of the tibia and fibula. The tibial fracture spiral, along the distal shaft with a possible subtle linear nondisplaced component extending to the metaphysis. The fracture is displaced, distal fracture component displacing laterally by 1.9 cm and anteriorly by 1.3 cm. Fractures foreshortened by approximately 2.3 cm. No comminution. No significant angulation. The fibular fractures comminuted, mildly displaced and extends along the proximal shaft. The primary distal fracture  component is displaced medially by approximately 1.3 cm. No other fractures.  Knee and ankle joints are normally aligned. Mild mid leg soft tissue swelling. IMPRESSION: 1. Fractures of the distal right tibia and proximal fibula as detailed above. No dislocation. Electronically Signed   By: Lajean Manes M.D.   On: 07/06/2021 13:25  ? ?CT Head Wo Contrast ? ?Result Date: 07/06/2021 ?CLINICAL DATA:  Head trauma, moderate-severe; trauma EXAM: CT HEAD WITHOUT CONTRAST CT MAXILLOFACIAL WITHOUT CONTRAST CT CERVICAL SPINE WITHOUT CONTRAST TECHNIQUE: Multidetector CT imaging of the head, cervical spine, and maxillofacial structures were performed using the standard protocol without intravenous contrast. Multiplanar CT image reconstructions of the cervical spine and maxillofacial structures were also generated. RADIATION DOSE REDUCTION: This exam was performed according to the departmental dose-optimization program which includes automated exposure control, adjustment of the mA and/or kV according to patient size and/or use of iterative reconstruction technique. COMPARISON:  CT head and cervical spine 03/24/2020. FINDINGS: CT HEAD FINDINGS Brain: No evidence of acute intracranial hemorrhage or extra-axial collection.No  evidence of mass lesion/concerning mass effect.The ventricles are normal in size.Scattered subcortical and periventricular white matter hypodensities, nonspecific but likely sequela of chronic small vessel ischemic disease.Mild cerebral atrophy Vascular: No hyperdense vessel. Skull: Negative for skull fracture. Other: High convexity right-sided scalp swelling with subcutaneous gas, possibly small lacerations. CT MAXILLOFACIAL FINDINGS Osseous: There is a displaced right inferior orbital wall fracture. There is abutment of the inferior rectus with the fracture posterior and medially. 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 Orbits: Soft tissue swelling along the inferior aspect of the right orbit. No  large orbital hematoma. The globes are intact. Sinuses: There is hyperdense material within the right maxillary sinus consistent with blood products. Mild mucosal thickening in the ethmoid air cells. Soft tis

## 2021-07-06 NOTE — Assessment & Plan Note (Addendum)
Possibly related to pain. Improved today. ?

## 2021-07-07 DIAGNOSIS — R030 Elevated blood-pressure reading, without diagnosis of hypertension: Secondary | ICD-10-CM | POA: Diagnosis not present

## 2021-07-07 DIAGNOSIS — S82201A Unspecified fracture of shaft of right tibia, initial encounter for closed fracture: Secondary | ICD-10-CM | POA: Diagnosis not present

## 2021-07-07 DIAGNOSIS — S00211A Abrasion of right eyelid and periocular area, initial encounter: Secondary | ICD-10-CM

## 2021-07-07 DIAGNOSIS — D72829 Elevated white blood cell count, unspecified: Secondary | ICD-10-CM

## 2021-07-07 DIAGNOSIS — E039 Hypothyroidism, unspecified: Secondary | ICD-10-CM | POA: Diagnosis not present

## 2021-07-07 DIAGNOSIS — S0231XA Fracture of orbital floor, right side, initial encounter for closed fracture: Secondary | ICD-10-CM

## 2021-07-07 DIAGNOSIS — S0285XA Fracture of orbit, unspecified, initial encounter for closed fracture: Secondary | ICD-10-CM

## 2021-07-07 LAB — BASIC METABOLIC PANEL
Anion gap: 11 (ref 5–15)
BUN: 17 mg/dL (ref 8–23)
CO2: 23 mmol/L (ref 22–32)
Calcium: 8.4 mg/dL — ABNORMAL LOW (ref 8.9–10.3)
Chloride: 101 mmol/L (ref 98–111)
Creatinine, Ser: 1.06 mg/dL (ref 0.61–1.24)
GFR, Estimated: >60 mL/min (ref >60)
Glucose, Bld: 197 mg/dL — ABNORMAL HIGH (ref 70–99)
Potassium: 5.1 mmol/L (ref 3.5–5.1)
Sodium: 135 mmol/L (ref 135–145)

## 2021-07-07 LAB — CBC
HCT: 41.8 % (ref 39.0–52.0)
Hemoglobin: 14.2 g/dL (ref 13.0–17.0)
MCH: 32.9 pg (ref 26.0–34.0)
MCHC: 34 g/dL (ref 30.0–36.0)
MCV: 96.8 fL (ref 80.0–100.0)
Platelets: 143 10*3/uL — ABNORMAL LOW (ref 150–400)
RBC: 4.32 MIL/uL (ref 4.22–5.81)
RDW: 12.4 % (ref 11.5–15.5)
WBC: 13.1 10*3/uL — ABNORMAL HIGH (ref 4.0–10.5)
nRBC: 0 % (ref 0.0–0.2)

## 2021-07-07 LAB — VITAMIN D 25 HYDROXY (VIT D DEFICIENCY, FRACTURES): Vit D, 25-Hydroxy: 56.5 ng/mL (ref 30–100)

## 2021-07-07 MED ORDER — SHARPS CONTAINER MISC: 1.0000 | 0 refills | Status: DC | PRN

## 2021-07-07 MED ORDER — DOCUSATE SODIUM 100 MG PO CAPS: 100.0000 mg | ORAL_CAPSULE | Freq: Two times a day (BID) | ORAL | 0 refills | Status: DC

## 2021-07-07 MED ORDER — OXYCODONE HCL 5 MG PO TABS: 5.0000 mg | ORAL_TABLET | Freq: Three times a day (TID) | ORAL | 0 refills | Status: DC | PRN

## 2021-07-07 MED ORDER — ENOXAPARIN SODIUM 40 MG/0.4ML IJ SOSY: 40.0000 mg | PREFILLED_SYRINGE | INTRAMUSCULAR | 0 refills | Status: DC

## 2021-07-07 MED ORDER — ACETAMINOPHEN 500 MG PO TABS: 1000.0000 mg | ORAL_TABLET | Freq: Three times a day (TID) | ORAL | 0 refills | Status: DC

## 2021-07-07 NOTE — Discharge Instructions (Signed)
? ?Orthopaedic Trauma Service Discharge Instructions ? ? ?General Discharge Instructions ? ?Orthopaedic Injuries: ? Closed right tibia and fibula fracture treated with intramedullary nailing ? ?WEIGHT BEARING STATUS: Nonweightbearing right leg.  Use walker or crutches to mobilize ? ?RANGE OF MOTION/ACTIVITY: Unrestricted range of motion right knee.  You are currently splinted so you unable to move your ankle.  Activity as tolerated while maintaining weightbearing restrictions ? ?Bone health: Vitamin D levels look great continue on your home regimen ? ?Review the following resource for additional information regarding bone health ? ?[email or web address] ? ?Wound Care: Do not remove splint.  We will remove at your first follow-up visit.  Do not get splint wet.  Call office with any questions ? ?DVT/PE prophylaxis: Lovenox 40 mg subcutaneous injection daily for the next 21 days ? ?Diet: as you were eating previously.  Can use over the counter stool softeners and bowel preparations, such as Miralax, to help with bowel movements.  Narcotics can be constipating.  Be sure to drink plenty of fluids ? ?PAIN MEDICATION USE AND EXPECTATIONS ? You have likely been given narcotic medications to help control your pain.  After a traumatic event that results in an fracture (broken bone) with or without surgery, it is ok to use narcotic pain medications to help control one's pain.  We understand that everyone responds to pain differently and each individual patient will be evaluated on a regular basis for the continued need for narcotic medications. Ideally, narcotic medication use should last no more than 6-8 weeks (coinciding with fracture healing).  ? As a patient it is your responsibility as well to monitor narcotic medication use and report the amount and frequency you use these medications when you come to your office visit.  ? We would also advise that if you are using narcotic medications, you should  take a dose prior to therapy to maximize you participation. ? ?IF YOU ARE ON NARCOTIC MEDICATIONS IT IS NOT PERMISSIBLE TO OPERATE A MOTOR VEHICLE (MOTORCYCLE/CAR/TRUCK/MOPED) OR HEAVY MACHINERY ?DO NOT MIX NARCOTICS WITH OTHER CNS (CENTRAL NERVOUS SYSTEM) DEPRESSANTS SUCH AS ALCOHOL ? ? ?POST-OPERATIVE OPIOID TAPER INSTRUCTIONS: ?It is important to wean off of your opioid medication as soon as possible. If you do not need pain medication after your surgery it is ok to stop day one. ?Opioids include: ?Codeine, Hydrocodone(Norco, Vicodin), Oxycodone(Percocet, oxycontin) and hydromorphone amongst others.  ?Long term and even short term use of opiods can cause: ?Increased pain response ?Dependence ?Constipation ?Depression ?Respiratory depression ?And more.  ?Withdrawal symptoms can include ?Flu like symptoms ?Nausea, vomiting ?And more ?Techniques to manage these symptoms ?Hydrate well ?Eat regular healthy meals ?Stay active ?Use relaxation techniques(deep breathing, meditating, yoga) ?Do Not substitute Alcohol to help with tapering ?If you have been on opioids for less than two weeks and do not have pain than it is ok to stop all together.  ?Plan to wean off of opioids ?This plan should start within one week post op of your fracture surgery  ?Maintain the same interval or time between taking each dose and first decrease the dose.  ?Cut the total daily intake of opioids by one tablet each day ?Next start to increase the time between doses. ?The last dose that should be eliminated is the evening dose.  ? ? ?STOP SMOKING OR USING NICOTINE PRODUCTS!!!! ? As discussed nicotine severely impairs your body's ability to heal surgical and traumatic wounds but also impairs bone healing.  Wounds and bone heal by forming microscopic blood vessels (angiogenesis)  and nicotine is a vasoconstrictor (essentially, shrinks blood vessels).  Therefore, if vasoconstriction occurs to these microscopic blood vessels they essentially  disappear and are unable to deliver necessary nutrients to the healing tissue.  This is one modifiable factor that you can do to dramatically increase your chances of healing your injury.   ? (This means no smoking, no nicotine gum, patches, etc) ? ?DO NOT USE NONSTEROIDAL ANTI-INFLAMMATORY DRUGS (NSAID'S) ? Using products such as Advil (ibuprofen), Aleve (naproxen), Motrin (ibuprofen) for additional pain control during fracture healing can delay and/or prevent the healing response.  If you would like to take over the counter (OTC) medication, Tylenol (acetaminophen) is ok.  However, some narcotic medications that are given for pain control contain acetaminophen as well. Therefore, you should not exceed more than 4000 mg of tylenol in a day if you do not have liver disease.  Also note that there are may OTC medicines, such as cold medicines and allergy medicines that my contain tylenol as well.  If you have any questions about medications and/or interactions please ask your doctor/PA or your pharmacist.  ?   ? ?ICE AND ELEVATE INJURED/OPERATIVE EXTREMITY ? Using ice and elevating the injured extremity above your heart can help with swelling and pain control.  Icing in a pulsatile fashion, such as 20 minutes on and 20 minutes off, can be followed.   ? Do not place ice directly on skin. Make sure there is a barrier between to skin and the ice pack.   ? Using frozen items such as frozen peas works well as the conform nicely to the are that needs to be iced. ? ?USE AN ACE WRAP OR TED HOSE FOR SWELLING CONTROL ? In addition to icing and elevation, Ace wraps or TED hose are used to help limit and resolve swelling.  It is recommended to use Ace wraps or TED hose until you are informed to stop.   ? When using Ace Wraps start the wrapping distally (farthest away from the body) and wrap proximally (closer to the body) ?  Example: If you had surgery on your leg or thing and you do not have a splint on, start the ace wrap at  the toes and work your way up to the thigh ?       If you had surgery on your upper extremity and do not have a splint on, start the ace wrap at your fingers and work your way up to the upper arm ? ?IF YOU ARE IN A SPLINT OR CAST DO NOT REMOVE IT FOR ANY REASON  ? If your splint gets wet for any reason please contact the office immediately. You may shower in your splint or cast as long as you keep it dry.  This can be done by wrapping in a cast cover or garbage back (or similar) ? Do Not stick any thing down your splint or cast such as pencils, money, or hangers to try and scratch yourself with.  If you feel itchy take benadryl as prescribed on the bottle for itching ? ?IF YOU ARE IN A CAM BOOT (BLACK BOOT) ? You may remove boot periodically. Perform daily dressing changes as noted below.  Wash the liner of the boot regularly and wear a sock when wearing the boot. It is recommended that you sleep in the boot until told otherwise ? ? ? ?Call office for the following: ?Temperature greater than 101F ?Persistent nausea and vomiting ?Severe uncontrolled pain ?Redness, tenderness, or signs  of infection (pain, swelling, redness, odor or green/yellow discharge around the site) ?Difficulty breathing, headache or visual disturbances ?Hives ?Persistent dizziness or light-headedness ?Extreme fatigue ?Any other questions or concerns you may have after discharge ? ?In an emergency, call 911 or go to an Emergency Department at a nearby hospital ? ?HELPFUL INFORMATION ? ?If you had a block, it will wear off between 8-24 hrs postop typically.  This is period when your pain may go from nearly zero to the pain you would have had postop without the block.  This is an abrupt transition but nothing dangerous is happening.  You may take an extra dose of narcotic when this happens. ? ?You should wean off your narcotic medicines as soon as you are able.  Most patients will be off or using minimal narcotics before their first postop  appointment.  ? ?We suggest you use the pain medication the first night prior to going to bed, in order to ease any pain when the anesthesia wears off. You should avoid taking pain medications on an empty stomach as it

## 2021-07-07 NOTE — Progress Notes (Signed)
Orthopedic Tech Progress Note ?Patient Details:  ?Edward Waller ?Mar 17, 1933 ?[id number] ? ?Pt and his wife expressed understanding of bringing the CAM boot to the first PO appointment. ? ?Ortho Devices ?Type of Ortho Device: CAM walker ?Ortho Device/Splint Location: with pt belongings, for RLE ?Ortho Device/Splint Interventions: Ordered ?  ?Post Interventions ?Patient Tolerated: Well ?Instructions Provided: Adjustment of device, Care of device ? ?Akari Defelice Jeri Modena ?07/07/2021, 5:56 PM ? ?

## 2021-07-07 NOTE — Progress Notes (Signed)
Orthopedic Tech Progress Note ?Patient Details:  ?Edward Waller ?09-18-1932 ?[id number] ? ?Ortho Devices ?Type of Ortho Device: Bone foam zero knee ?Ortho Device/Splint Location: applied at 0621am ?Ortho Device/Splint Interventions: Ordered, Application, Adjustment ?  ?Post Interventions ?Patient Tolerated: Well ?Instructions Provided: Care of device, Adjustment of device ? ?Karolee Stamps ?07/07/2021, 6:22 AM ? ?

## 2021-07-07 NOTE — Progress Notes (Signed)
? ?                              Orthopaedic Trauma Service Progress Note ? ?Patient ID: ?Edward Waller ?MRN: [id number] ?DOB/AGE: [birth year] 86 y.o. ? ?Subjective: ? ?Doing great  ?Minimal pain R leg ?Worked well with therapies this am  ?No specific complaints  ? ? ?ROS ?As above ? ?Objective:  ? ?VITALS:   ?Vitals:  ? 07/06/21 1934 07/06/21 2014 07/07/21 0300 07/07/21 0812  ?BP: (!) 146/77 (!) 149/78 (!) 115/59 132/71  ?Pulse: 75 76 73 74  ?Resp: '13 13 15 16  '$ ?Temp: 98 ?F (36.7 ?C) 97.8 ?F (36.6 ?C) 98.4 ?F (36.9 ?C) (!) 97.5 ?F (36.4 ?C)  ?TempSrc:  Oral Oral Oral  ?SpO2: 95% 98% 96% 98%  ?Weight:      ?Height:      ? ? ?Estimated body mass index is 23.86 kg/m? as calculated from the following: ?  Height as of this encounter: 6' (1.829 m). ?  Weight as of this encounter: 79.8 kg. ? ? ?Intake/Output   ?   03/17 0701 ?03/18 0700 03/18 0701 ?03/19 0700  ? P.O.  100  ? I.V. (mL/kg) 1500 (18.8)   ? Other 0   ? IV Piggyback 42.6 100  ? Total Intake(mL/kg) 1542.6 (19.3) 200 (2.5)  ? Urine (mL/kg/hr) 561   ? Emesis/NG output 0   ? Other 0   ? Blood 100   ? Total Output 661   ? Net +881.6 +200  ?     ?  ? ?LABS ? ?Results for orders placed or performed during the hospital encounter of 07/06/21 (from the past 24 hour(s))  ?Surgical PCR Screen     Status: None  ? Collection Time: 07/06/21 11:21 AM  ? Specimen: Nasal Mucosa; Nasal Swab  ?Result Value Ref Range  ? MRSA, PCR NEGATIVE NEGATIVE  ? Staphylococcus aureus NEGATIVE NEGATIVE  ?Type and screen [place]     Status: None  ? Collection Time: 07/06/21 11:50 AM  ?Result Value Ref Range  ? ABO/RH(D) O POS   ? Antibody Screen NEG   ? Sample Expiration    ?  07/09/2021,2359 ?Performed at [facility], [place] [address] [place], [place] [address] ?  ?ABO/Rh     Status: None  ? Collection Time: 07/06/21 11:55 AM  ?Result Value Ref Range  ? ABO/RH(D)    ?  O POS ?Performed at [facility], [place] [address] [place], [place] [address] ?  ?CBC with Differential     Status: Abnormal  ? Collection Time: 07/06/21 12:03 PM  ?Result Value Ref Range  ? WBC 8.8 4.0 - 10.5 K/uL  ? RBC 4.99 4.22 - 5.81 MIL/uL  ? Hemoglobin 16.7 13.0 - 17.0 g/dL  ? HCT 49.1 39.0 - 52.0 %  ? MCV 98.4 80.0 - 100.0 fL  ? MCH 33.5 26.0 - 34.0 pg  ? MCHC 34.0 30.0 - 36.0 g/dL  ? RDW 12.6 11.5 - 15.5 %  ? Platelets 170 150 - 400 K/uL  ? nRBC 0.0 0.0 - 0.2 %  ? Neutrophils Relative % 72 %  ? Neutro Abs 6.4 1.7 - 7.7 K/uL  ? Lymphocytes Relative 18 %  ? Lymphs Abs 1.6 0.7 - 4.0 K/uL  ? Monocytes Relative 6 %  ? Monocytes Absolute 0.5 0.1 - 1.0 K/uL  ? Eosinophils Relative 2 %  ?  Eosinophils Absolute 0.2 0.0 - 0.5 K/uL  ? Basophils Relative 1 %  ? Basophils Absolute 0.0 0.0 - 0.1 K/uL  ? Immature Granulocytes 1 %  ? Abs Immature Granulocytes 0.09 (H) 0.00 - 0.07 K/uL  ?Basic metabolic panel     Status: Abnormal  ? Collection Time: 07/06/21 12:03 PM  ?Result Value Ref Range  ? Sodium 139 135 - 145 mmol/L  ? Potassium 4.4 3.5 - 5.1 mmol/L  ? Chloride 103 98 - 111 mmol/L  ? CO2 26 22 - 32 mmol/L  ? Glucose, Bld 126 (H) 70 - 99 mg/dL  ? BUN 15 8 - 23 mg/dL  ? Creatinine, Ser 0.95 0.61 - 1.24 mg/dL  ? Calcium 9.3 8.9 - 10.3 mg/dL  ? GFR, Estimated >60 >60 mL/min  ? Anion gap 10 5 - 15  ?CBC     Status: Abnormal  ? Collection Time: 07/06/21 10:21 PM  ?Result Value Ref Range  ? WBC 11.5 (H) 4.0 - 10.5 K/uL  ? RBC 4.60 4.22 - 5.81 MIL/uL  ? Hemoglobin 14.7 13.0 - 17.0 g/dL  ? HCT 45.3 39.0 - 52.0 %  ? MCV 98.5 80.0 - 100.0 fL  ? MCH 32.0 26.0 - 34.0 pg  ? MCHC 32.5 30.0 - 36.0 g/dL  ? RDW 12.4 11.5 - 15.5 %  ? Platelets 159 150 - 400 K/uL  ? nRBC 0.0 0.0 - 0.2 %  ?Creatinine, serum     Status: None  ? Collection Time: 07/06/21 10:21 PM  ?Result Value Ref Range  ? Creatinine, Ser 1.07 0.61 - 1.24 mg/dL  ? GFR, Estimated >60 >60 mL/min  ?Basic metabolic panel     Status: Abnormal  ? Collection Time: 07/07/21 12:52 AM  ?Result Value Ref Range  ? Sodium 135 135 - 145  mmol/L  ? Potassium 5.1 3.5 - 5.1 mmol/L  ? Chloride 101 98 - 111 mmol/L  ? CO2 23 22 - 32 mmol/L  ? Glucose, Bld 197 (H) 70 - 99 mg/dL  ? BUN 17 8 - 23 mg/dL  ? Creatinine, Ser 1.06 0.61 - 1.24 mg/dL  ? Calcium 8.4 (L) 8.9 - 10.3 mg/dL  ? GFR, Estimated >60 >60 mL/min  ? Anion gap 11 5 - 15  ?CBC     Status: Abnormal  ? Collection Time: 07/07/21 12:52 AM  ?Result Value Ref Range  ? WBC 13.1 (H) 4.0 - 10.5 K/uL  ? RBC 4.32 4.22 - 5.81 MIL/uL  ? Hemoglobin 14.2 13.0 - 17.0 g/dL  ? HCT 41.8 39.0 - 52.0 %  ? MCV 96.8 80.0 - 100.0 fL  ? MCH 32.9 26.0 - 34.0 pg  ? MCHC 34.0 30.0 - 36.0 g/dL  ? RDW 12.4 11.5 - 15.5 %  ? Platelets 143 (L) 150 - 400 K/uL  ? nRBC 0.0 0.0 - 0.2 %  ? ? ? ?PHYSICAL EXAM:  ? ?Gen: resting comfortably in bed, NAD, appears well, wife at bedside  ?Lungs: unlabored ?Cardiac: reg  ?Ext:  ?     Right Lower Extremity  ? Splint fitting well ? Splint clean, dry and intact ? Ext warm  ? + DP pulse ? No pain out of proportion with passive stretch  ? DPN, SPN, TN sensation intact ? EHL, FHL, lesser toe motor intact ? Swelling controlled  ? ?Assessment/Plan: ?1 Day Post-Op  ? ?Principal Problem: ?  Right tibial and fibular fracture ?Active Problems: ?  Hypothyroidism ?  Restless legs ?  Sternal fracture ?  Orbital fracture ([place]) ?  Elevated blood pressure reading ? ? ?Anti-infectives (From admission, onward)  ? ? Start     Dose/Rate Route Frequency Ordered Stop  ? 07/07/21 0600  ceFAZolin (ANCEF) IVPB 2g/100 mL premix       ? 2 g ?200 mL/hr over 30 Minutes Intravenous On call to O.R. 07/06/21 1542 07/06/21 1636  ? 07/06/21 2300  ceFAZolin (ANCEF) IVPB 1 g/50 mL premix       ? 1 g ?100 mL/hr over 30 Minutes Intravenous Every 6 hours 07/06/21 2214 07/07/21 1659  ? 07/06/21 1458  ceFAZolin (ANCEF) 2-4 GM/100ML-% IVPB       ?Note to Pharmacy: Humberto Leep O: cabinet override  ?    07/06/21 1458 07/06/21 1644  ? 07/06/21 1300  ceFAZolin (ANCEF) IVPB 1 g/50 mL premix       ? 1 g ?100 mL/hr over 30 Minutes  Intravenous  Once 07/06/21 1254 07/06/21 1401  ? ?  ?. ? ?POD/HD#: 1 ? ?88 y/o male with closed R tibia and fibula fracture  ? ?- Closed R tibia and fibula fracture s/p IMN R tibia, stress evaluation of R ankle syndesmosis (stress stable) ? Splint x 2 weeks then convert to CAM boot  ? NWB x 6 weeks  ? Ice and elevate for swelling and pain control  ? PT/OT ?  ?PT- please teach HEP for R knee ROM- AROM, PROM. No ROM restrictions.  Quad sets, SLR, LAQ, SAQ, heel slides, stretching, ? ?No pillows under bend of knee when at rest, ok to place under heel to help work on extension. Can also use zero knee bone foam if available ?  ? ?- Pain management: ? Multimodal  ? Minimize narcotics  ? Has only had tylenol today  ? ?- ABL anemia/Hemodynamics ? Stable ? ?- Medical issues  ? Per primary  ? ?- DVT/PE prophylaxis: ? Lovenox x 21 days ? ?- ID:  ? Periop abx  ? ?- Metabolic Bone Disease: ? Vitamin d levels look great  ? ?- Activity: ? Up with assistance  ? ?- Dispo: ? Ortho issues stable ? Ok to [employer] home tomorrow from our standpoint  ? Follow up with ortho in 10-14 days  ? ? ? ?Jari Pigg, PA-C ?[phone] (C) ?07/07/2021, 10:11 AM ? ?[facility] ?[place] [address] ?[phone] Jenetta Downer) ?[phone] (F) ? ? ? ?After 5pm and on the weekends please log on to Amion, go to orthopaedics and the look under the [facility] Call for the provider(s) on call. You can also call our office at [phone] and then follow the prompts to be connected to the call team.  ? Patient ID: Edward Waller, male   DOB: July 11, 1932, 88 y.o.   MRN: [id number] ? ?

## 2021-07-07 NOTE — Progress Notes (Signed)
PT Note ? ?Patient suffers from Rt tib fib fx which impairs their ability to perform daily activities like ambulating in the home.  A walker alone will not resolve the issues with performing activities of daily living. A wheelchair will allow patient to safely perform daily activities.  The patient can self propel in the home or has a caregiver who can provide assistance.     ? ?[facility] PT ?Acute Rehabilitation Services ?Pager [phone] ?Office [phone] ? ?
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
## 2021-07-07 NOTE — Assessment & Plan Note (Addendum)
Likely reactive. Mild. Stable. ?
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
## 2021-07-07 NOTE — TOC Initial Note (Signed)
Transition of Care (TOC) - Initial/Assessment Note  ? ? ?Patient Details  ?Name: Edward Waller ?MRN: [id number] ?Date of Birth: October 28, 1932 ? ?Transition of Care (TOC) CM/SW Contact:    ?Bartholomew Crews, RN ?Phone Number: [phone] ?07/07/2021, 1:34 PM ? ?Clinical Narrative:                 ? ?Spoke with patient and spouse at the bedside to discuss post acute transition. Discussed recommendations for HHPT. Choice offered. Referral accepted by [facility]. HH PT order placed yesterday. Discussed wheelchair and RW needs. Referral to [employer] for delivery to the home. Discussed transportation home. Requesting ambulance transport. PCP is Dr. Landry Mellow at [facility].  ? ?Expected Discharge Plan: [place] ?Barriers to Discharge: Continued Medical Work up ? ? ?Patient Goals and CMS Choice ?Patient states their goals for this hospitalization and ongoing recovery are:: return home with spouse ?CMS Medicare.gov Compare Post Acute Care list provided to:: Patient ?Choice offered to / list presented to : Patient, Spouse ? ?Expected Discharge Plan and Services ?Expected Discharge Plan: [place] ?  ?Discharge Planning Services: CM Consult ?Post Acute Care Choice: Durable Medical Equipment, Home Health ?Living arrangements for the past 2 months: [place] ?                ?DME Arranged: Wheelchair manual, Walker rolling ?DME Agency: [employer] ?Date DME Agency Contacted: 07/07/21 ?Time DME Agency Contacted: [phone] ?Representative spoke with at DME Agency: Brenton Grills ?HH Arranged: PT ?[place] Agency: [facility] ?Date HH Agency Contacted: 07/07/21 ?Time [place]: 1245 ?Representative spoke with at [place]: Delsa Sale ? ?Prior Living Arrangements/Services ?Living arrangements for the past 2 months: [place] ?Lives with:: Self, Spouse ?Patient language and need for interpreter reviewed:: Yes ?Do you feel safe going back to the place where you live?: Yes       ?Need for Family Participation in Patient Care: Yes (Comment) ?Care giver support system in place?: Yes (comment) ?  ?Criminal Activity/Legal Involvement Pertinent to Current Situation/Hospitalization: No - Comment as needed ? ?Activities of Daily Living ?Home Assistive Devices/Equipment: Blood pressure cuff, Dentures (specify type), Eyeglasses ?ADL Screening (condition at time of admission) ?Patient's cognitive ability adequate to safely complete daily activities?: Yes ?Is the patient deaf or have difficulty hearing?: No ?Does the patient have difficulty seeing, even when wearing glasses/contacts?: No ?Does the patient have difficulty concentrating, remembering, or making decisions?: No ?Patient able to express need for assistance with ADLs?: Yes ?Does the patient have difficulty dressing or bathing?: No ?Independently performs ADLs?: Yes (appropriate for developmental age) ?Does the patient have difficulty walking or climbing stairs?: No ?Weakness of Legs: Both ?Weakness of Arms/Hands: None ? ?Permission Sought/Granted ?Permission sought to share information with : Family Supports ?  ? Share Information with NAME: [place] Daigler ?   ? Permission granted to share info w Relationship: spouse ? Permission granted to share info w Contact Information: [phone] ? ?Emotional Assessment ?Appearance:: Appears stated age ?Attitude/Demeanor/Rapport: Engaged ?Affect (typically observed): Accepting ?Orientation: : Oriented to Self, Oriented to Place, Oriented to  Time, Oriented to Situation ?Alcohol / Substance Use: Not Applicable ?Psych Involvement: No (comment) ? ?Admission diagnosis:  Pain [R52] ?Right tibial fracture [S82.201A] ?Closed head injury, initial encounter [S09.90XA] ?Closed fracture of orbit, initial encounter ([place]) [S02.85XA] ?Abrasion of face, initial encounter [S00.81XA] ?Closed fracture of proximal end of right fibula, unspecified fracture morphology, initial encounter [K53.976B] ?Closed fracture of  distal end of right tibia, unspecified fracture morphology, initial encounter [S82.301A] ?Patient Active Problem List  ? Diagnosis Date Noted  ? Right tibial and fibular fracture 07/06/2021  ? Sternal fracture 07/06/2021  ? Orbital fracture ([place]) 07/06/2021  ? Elevated blood pressure reading 07/06/2021  ? Encounter to establish care 05/16/2020  ? Hypothyroidism 05/16/2020  ? BPH (benign prostatic hyperplasia) 05/16/2020  ? Closed left ankle fracture 05/16/2020  ? Restless legs 05/16/2020  ? Mass of right lower leg 05/16/2020  ? ?PCP:  Riki Sheer, NP ?Pharmacy:   ?[facility] # [place], [place] ?[place] ?[place] [address] ?Phone: [phone] Fax: [phone] ? ?[place], [place] ?[address] ?[place] [address] ?Phone: [phone] Fax: [phone] ? ?[place], [address] ?[phone] ?[place] [address] ?Phone: [phone] Fax: [phone] ? ? ? ? ?Social Determinants of Health (SDOH) Interventions ?  ? ?Readmission Risk Interventions ?No flowsheet data found. ? ? ?
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
## 2021-07-08 DIAGNOSIS — S82201A Unspecified fracture of shaft of right tibia, initial encounter for closed fracture: Secondary | ICD-10-CM | POA: Diagnosis not present

## 2021-07-08 DIAGNOSIS — S0285XA Fracture of orbit, unspecified, initial encounter for closed fracture: Secondary | ICD-10-CM | POA: Diagnosis not present

## 2021-07-08 DIAGNOSIS — E039 Hypothyroidism, unspecified: Secondary | ICD-10-CM | POA: Diagnosis not present

## 2021-07-08 DIAGNOSIS — R030 Elevated blood-pressure reading, without diagnosis of hypertension: Secondary | ICD-10-CM | POA: Diagnosis not present

## 2021-07-08 LAB — CBC
HCT: 35.5 % — ABNORMAL LOW (ref 39.0–52.0)
Hemoglobin: 12.2 g/dL — ABNORMAL LOW (ref 13.0–17.0)
MCH: 33.1 pg (ref 26.0–34.0)
MCHC: 34.4 g/dL (ref 30.0–36.0)
MCV: 96.2 fL (ref 80.0–100.0)
Platelets: 145 10*3/uL — ABNORMAL LOW (ref 150–400)
RBC: 3.69 MIL/uL — ABNORMAL LOW (ref 4.22–5.81)
RDW: 12.7 % (ref 11.5–15.5)
WBC: 11.1 10*3/uL — ABNORMAL HIGH (ref 4.0–10.5)
nRBC: 0 % (ref 0.0–0.2)

## 2021-07-08 MED ORDER — ROPINIROLE HCL 0.5 MG PO TABS
1.0000 mg | ORAL_TABLET | Freq: Every day | ORAL | Status: DC
  Administered 2021-07-08 – 2021-07-11 (×4): 1 mg via ORAL
  Filled 2021-07-08 (×4): qty 2

## 2021-07-08 MED ORDER — BISACODYL 10 MG RE SUPP
10.0000 mg | Freq: Once | RECTAL | Status: AC
  Administered 2021-07-08: 10 mg via RECTAL
  Filled 2021-07-08: qty 1

## 2021-07-08 MED ORDER — GABAPENTIN 100 MG PO CAPS
200.0000 mg | ORAL_CAPSULE | Freq: Once | ORAL | Status: AC
  Administered 2021-07-08: 200 mg via ORAL
  Filled 2021-07-08: qty 2

## 2021-07-08 MED ORDER — POLYETHYLENE GLYCOL 3350 17 G PO PACK
17.0000 g | PACK | Freq: Two times a day (BID) | ORAL | Status: DC
  Administered 2021-07-08 – 2021-07-12 (×9): 17 g via ORAL
  Filled 2021-07-08 (×9): qty 1

## 2021-07-08 NOTE — TOC CAGE-AID Note (Signed)
Transition of Care (TOC) - CAGE-AID Screening ? ? ?Patient Details  ?Name: Edward Waller ?MRN: [id number] ?Date of Birth: [birth year] ? ?Clinical Narrative: ? ?Patient reports no current alcohol or drug use at this time, no need for substance abuse resources. ? ?CAGE-AID Screening: ?  ? ?Have You Ever Felt You Ought to Cut Down on Your Drinking or Drug Use?: No ?Have People Annoyed You By Critizing Your Drinking Or Drug Use?: No ?Have You Felt Bad Or Guilty About Your Drinking Or Drug Use?: No ?Have You Ever Had a Drink or Used Drugs First Thing In The Morning to Steady Your Nerves or to Get Rid of a Hangover?: No ?CAGE-AID Score: 0 ? ?Substance Abuse Education Offered: No ? ?  ? ? ? ? ? ? ?
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
## 2021-07-08 NOTE — TOC Progression Note (Signed)
Transition of Care (TOC) - Progression Note  ? ? ?Patient Details  ?Name: JONAH GINGRAS ?MRN: [id number] ?Date of Birth: March 07, 1933 ? ?Transition of Care (TOC) CM/SW Contact  ?Bartholomew Crews, RN ?Phone Number: [phone] ?07/08/2021, 8:50 AM ? ?Clinical Narrative:    ? ?Spoke with spouse on hospital phone. Spouse advised that patient will need rehab first. Patient has never been to rehab, but she has relatives who have. Interested possibly in [place] area, but agreeable to being faxed out. CSW to assist with SNF workup.  ? ?Expected Discharge Plan: [place] ?Barriers to Discharge: Continued Medical Work up ? ?Expected Discharge Plan and Services ?Expected Discharge Plan: [place] ?  ?Discharge Planning Services: CM Consult ?Post Acute Care Choice: Durable Medical Equipment, Home Health ?Living arrangements for the past 2 months: [place] ?Expected Discharge Date: 07/08/21               ?DME Arranged: Programmer, multimedia, Walker rolling ?DME Agency: [employer] ?Date DME Agency Contacted: 07/07/21 ?Time DME Agency Contacted: [phone] ?Representative spoke with at DME Agency: Brenton Grills ?HH Arranged: PT ?[place] Agency: [facility] ?Date HH Agency Contacted: 07/07/21 ?Time [place]: 9323 ?Representative spoke with at [place]: Delsa Sale ? ? ?Social Determinants of Health (SDOH) Interventions ?  ? ?Readmission Risk Interventions ?No flowsheet data found. ? ?
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
## 2021-07-09 DIAGNOSIS — R030 Elevated blood-pressure reading, without diagnosis of hypertension: Secondary | ICD-10-CM | POA: Diagnosis not present

## 2021-07-09 DIAGNOSIS — S2220XA Unspecified fracture of sternum, initial encounter for closed fracture: Secondary | ICD-10-CM

## 2021-07-09 DIAGNOSIS — G2581 Restless legs syndrome: Secondary | ICD-10-CM

## 2021-07-09 DIAGNOSIS — E039 Hypothyroidism, unspecified: Secondary | ICD-10-CM | POA: Diagnosis not present

## 2021-07-09 DIAGNOSIS — S0285XA Fracture of orbit, unspecified, initial encounter for closed fracture: Secondary | ICD-10-CM | POA: Diagnosis not present

## 2021-07-09 DIAGNOSIS — S82201A Unspecified fracture of shaft of right tibia, initial encounter for closed fracture: Secondary | ICD-10-CM | POA: Diagnosis not present

## 2021-07-09 LAB — BASIC METABOLIC PANEL
Anion gap: 5 (ref 5–15)
BUN: 23 mg/dL (ref 8–23)
CO2: 26 mmol/L (ref 22–32)
Calcium: 8.8 mg/dL — ABNORMAL LOW (ref 8.9–10.3)
Chloride: 105 mmol/L (ref 98–111)
Creatinine, Ser: 1.02 mg/dL (ref 0.61–1.24)
GFR, Estimated: >60 mL/min (ref >60)
Glucose, Bld: 132 mg/dL — ABNORMAL HIGH (ref 70–99)
Potassium: 4.6 mmol/L (ref 3.5–5.1)
Sodium: 136 mmol/L (ref 135–145)

## 2021-07-09 LAB — CBC
HCT: 37 % — ABNORMAL LOW (ref 39.0–52.0)
Hemoglobin: 12.6 g/dL — ABNORMAL LOW (ref 13.0–17.0)
MCH: 33.3 pg (ref 26.0–34.0)
MCHC: 34.1 g/dL (ref 30.0–36.0)
MCV: 97.9 fL (ref 80.0–100.0)
Platelets: 153 10*3/uL (ref 150–400)
RBC: 3.78 MIL/uL — ABNORMAL LOW (ref 4.22–5.81)
RDW: 12.9 % (ref 11.5–15.5)
WBC: 7.9 10*3/uL (ref 4.0–10.5)
nRBC: 0 % (ref 0.0–0.2)

## 2021-07-09 NOTE — Progress Notes (Signed)
? ?                              [facility]edic [facility] Progress Note ? ?Patient ID: ?Edward Waller ?MRN: [id number] ?DOB/AGE: Jun 18, 1932 88 y.o. ? ?Subjective: ? ?Doing well ?Pain tolerable ?Needs short term snf ?No other complaints  ? ? ?ROS ?As above ? ?Objective:  ? ?VITALS:   ?Vitals:  ? 07/08/21 2123 07/09/21 0519 07/09/21 0730 07/09/21 1623  ?BP: (!) 147/76 116/61 (!) 164/88 (!) 146/76  ?Pulse: 75 60 80 71  ?Resp: '18 16 16 16  '$ ?Temp: 98.5 ?F (36.9 ?C) 97.6 ?F (36.4 ?C) 97.7 ?F (36.5 ?C) 98.6 ?F (37 ?C)  ?TempSrc: Oral Oral Oral Oral  ?SpO2: 96% 97% 100% 100%  ?Weight:      ?Height:      ? ? ?Estimated body mass index is 23.86 kg/m? as calculated from the following: ?  Height as of this encounter: 6' (1.829 m). ?  Weight as of this encounter: 79.8 kg. ? ? ?Intake/Output   ?   03/19 0701 ?03/20 0700 03/20 0701 ?03/21 0700  ? P.O. 480   ? IV Piggyback    ? Total Intake(mL/kg) 480 (6)   ? Urine (mL/kg/hr) 600 (0.3) 600 (0.8)  ? Total Output 600 600  ? Net -120 -600  ?     ? Urine Occurrence 1 x   ?  ? ?LABS ? ?Results for orders placed or performed during the hospital encounter of 07/06/21 (from the past 24 hour(s))  ?Basic metabolic panel     Status: Abnormal  ? Collection Time: 07/09/21  1:04 AM  ?Result Value Ref Range  ? Sodium 136 135 - 145 mmol/L  ? Potassium 4.6 3.5 - 5.1 mmol/L  ? Chloride 105 98 - 111 mmol/L  ? CO2 26 22 - 32 mmol/L  ? Glucose, Bld 132 (H) 70 - 99 mg/dL  ? BUN 23 8 - 23 mg/dL  ? Creatinine, Ser 1.02 0.61 - 1.24 mg/dL  ? Calcium 8.8 (L) 8.9 - 10.3 mg/dL  ? GFR, Estimated >60 >60 mL/min  ? Anion gap 5 5 - 15  ?CBC     Status: Abnormal  ? Collection Time: 07/09/21  1:04 AM  ?Result Value Ref Range  ? WBC 7.9 4.0 - 10.5 K/uL  ? RBC 3.78 (L) 4.22 - 5.81 MIL/uL  ? Hemoglobin 12.6 (L) 13.0 - 17.0 g/dL  ? HCT 37.0 (L) 39.0 - 52.0 %  ? MCV 97.9 80.0 - 100.0 fL  ? MCH 33.3 26.0 - 34.0 pg  ? MCHC 34.1 30.0 - 36.0 g/dL  ? RDW 12.9 11.5 - 15.5 %  ?  Platelets 153 150 - 400 K/uL  ? nRBC 0.0 0.0 - 0.2 %  ? ? ? ?PHYSICAL EXAM:  ? ?Gen: resting comfortably in bed, NAD, appears well ?Lungs: unlabored ?Ext:  ?     Right Lower Extremity  ?            Splint fitting well ?            Splint clean, dry and intact ?            Ext warm  ?            + DP pulse ?            No pain out of proportion with passive stretch  ?  DPN, SPN, TN sensation intact ?            EHL, FHL, lesser toe motor intact ?            Swelling controlled  ? Dressing removed from knee, incisions look great and are dry  ? ?Assessment/Plan: ?3 Days Post-Op  ? ? ? ?Anti-infectives (From admission, onward)  ? ? Start     Dose/Rate Route Frequency Ordered Stop  ? 07/07/21 0600  ceFAZolin (ANCEF) IVPB 2g/100 mL premix       ? 2 g ?200 mL/hr over 30 Minutes Intravenous On call to O.R. 07/06/21 1542 07/06/21 1636  ? 07/06/21 2300  ceFAZolin (ANCEF) IVPB 1 g/50 mL premix       ? 1 g ?100 mL/hr over 30 Minutes Intravenous Every 6 hours 07/06/21 2214 07/07/21 1159  ? 07/06/21 1458  ceFAZolin (ANCEF) 2-4 GM/100ML-% IVPB       ?Note to Pharmacy: Humberto Leep O: cabinet override  ?    07/06/21 1458 07/06/21 1644  ? 07/06/21 1300  ceFAZolin (ANCEF) IVPB 1 g/50 mL premix       ? 1 g ?100 mL/hr over 30 Minutes Intravenous  Once 07/06/21 1254 07/06/21 1401  ? ?  ?. ? ?POD/HD#: 3 ? ?88 y/o male with closed R tibia and fibula fracture  ?  ?- Closed R tibia and fibula fracture s/p IMN R tibia, stress evaluation of R ankle syndesmosis (stress stable) ?            Splint x 2 weeks then convert to CAM boot  ?            NWB x 6 weeks  ?            Ice and elevate for swelling and pain control  ?            PT/OT ?             ?PT- please teach HEP for R knee ROM- AROM, PROM. No ROM restrictions.  Quad sets, SLR, LAQ, SAQ, heel slides, stretching, ?  ?No pillows under bend of knee when at rest, ok to place under heel to help work on extension. Can also use zero knee bone foam if available ?  ?  ?- Pain  management: ?            Multimodal  ?            Minimize narcotics  ?  ?- ABL anemia/Hemodynamics ?            Stable ?  ?- Medical issues  ?            Per primary  ?  ?- DVT/PE prophylaxis: ?            Lovenox x 21 days ?  ?- ID:  ?            Periop abx  ?  ?- Metabolic Bone Disease: ?            Vitamin d levels look great  ?  ?- Activity: ?            Up with assistance  ?  ?- Dispo: ?            Ortho issues stable ?            Follow up with ortho in 10-14 days  ?  ? ?Jari Pigg, PA-C ?[id number] (C) ?07/09/2021, 4:51  PM ? ?[facility] ?[place] [address] ?[phone]) ?[phone] (F) ? ? ? ?After 5pm and on the weekends please log on to Amion, go to orthopaedics and the look under the [facility] Call for the provider(s) on call. You can also call our office at [phone] and then follow the prompts to be connected to the call team.  ? Patient ID: Edward Waller, male   DOB: 1932-09-11, 88 y.o.   MRN: [id number] ? ?

## 2021-07-09 NOTE — TOC Initial Note (Signed)
Transition of Care (TOC) - Initial/Assessment Note  ? ? ?Patient Details  ?Name: Edward Waller ?MRN: [id number] ?Date of Birth: 10/29/1932 ? ?Transition of Care (TOC) CM/SW Contact:    ?Emeterio Reeve, LCSW ?Phone Number: ?07/09/2021, 3:38 PM ? ?Clinical Narrative:                 ? ?CSW received SNF consult. CSW met with pt and wife at bedside. CSW introduced self and explained role at the hospital. Pt reports that PTA pt lived at home with his wife. Pt was independent at bedside.  ? ?CSW reviewed PT/OT recommendations for SNF. Pt reports he is fine with going to she short term. Pt gave CSW permission to fax out to facilities in the area. Pt has no preference of facility at this time. CSW gave pt medicare.gov rating list to review. CSW explained insurance auth process. Pt reports they are covid vaccinated. ? ?CSW will continue to follow. ? ? ? ?Expected Discharge Plan: [place] ?Barriers to Discharge: Continued Medical Work up ? ? ?Patient Goals and CMS Choice ?Patient states their goals for this hospitalization and ongoing recovery are:: to get stronger at SNF ?CMS Medicare.gov Compare Post Acute Care list provided to:: Patient ?Choice offered to / list presented to : Patient ? ?Expected Discharge Plan and Services ?Expected Discharge Plan: [place] ?  ?Discharge Planning Services: CM Consult ?Post Acute Care Choice: Durable Medical Equipment, Home Health ?Living arrangements for the past 2 months: [place] ?Expected Discharge Date: 07/08/21               ?DME Arranged: Programmer, multimedia, Walker rolling ?DME Agency: [facility] ?Date DME Agency Contacted: 07/07/21 ?Time DME Agency Contacted: [phone] ?Representative spoke with at DME Agency: Brenton Grills ?HH Arranged: PT ?[place] Agency: [facility] ?Date HH Agency Contacted: 07/07/21 ?Time [place]: 2355 ?Representative spoke with at [place]: Delsa Sale ? ?Prior Living Arrangements/Services ?Living  arrangements for the past 2 months: [place] ?Lives with:: Spouse ?Patient language and need for interpreter reviewed:: Yes ?Do you feel safe going back to the place where you live?: Yes      ?Need for Family Participation in Patient Care: Yes (Comment) ?Care giver support system in place?: Yes (comment) ?  ?Criminal Activity/Legal Involvement Pertinent to Current Situation/Hospitalization: No - Comment as needed ? ?Activities of Daily Living ?Home Assistive Devices/Equipment: Blood pressure cuff, Dentures (specify type), Eyeglasses ?ADL Screening (condition at time of admission) ?Patient's cognitive ability adequate to safely complete daily activities?: Yes ?Is the patient deaf or have difficulty hearing?: No ?Does the patient have difficulty seeing, even when wearing glasses/contacts?: No ?Does the patient have difficulty concentrating, remembering, or making decisions?: No ?Patient able to express need for assistance with ADLs?: Yes ?Does the patient have difficulty dressing or bathing?: No ?Independently performs ADLs?: Yes (appropriate for developmental age) ?Does the patient have difficulty walking or climbing stairs?: No ?Weakness of Legs: Both ?Weakness of Arms/Hands: None ? ?Permission Sought/Granted ?Permission sought to share information with : Customer service manager ?Permission granted to share information with : Yes, Verbal Permission Granted ? Share Information with NAME: Wife ?   ? Permission granted to share info w Relationship: spouse ? Permission granted to share info w Contact Information: [phone] ? ?Emotional Assessment ?Appearance:: Appears stated age ?Attitude/Demeanor/Rapport: Engaged ?Affect (typically observed): Appropriate ?Orientation: : Oriented to Self, Oriented to Place, Oriented to  Time, Oriented to Situation ?Alcohol / Substance Use: Not Applicable ?  Psych Involvement: No (comment) ? ?Admission diagnosis:  Pain [R52] ?Right tibial fracture  [S82.201A] ?Closed head injury, initial encounter [S09.90XA] ?Closed fracture of orbit, initial encounter ([place]) [S02.85XA] ?Abrasion of face, initial encounter [S00.81XA] ?Closed fracture of proximal end of right fibula, unspecified fracture morphology, initial encounter [A67.737V] ?Closed fracture of distal end of right tibia, unspecified fracture morphology, initial encounter [S82.301A] ?Patient Active Problem List  ? Diagnosis Date Noted  ? Leukocytosis 07/07/2021  ? Right tibial and fibular fracture 07/06/2021  ? Sternal fracture 07/06/2021  ? Orbital fracture ([place]) 07/06/2021  ? Elevated blood pressure reading 07/06/2021  ? Encounter to establish care 05/16/2020  ? Hypothyroidism 05/16/2020  ? BPH (benign prostatic hyperplasia) 05/16/2020  ? Closed left ankle fracture 05/16/2020  ? Restless legs 05/16/2020  ? Mass of right lower leg 05/16/2020  ? ?PCP:  Riki Sheer, NP ?Pharmacy:   ?[facility] # [place], [place] ?[place] ?[place] [address] ?Phone: [phone] Fax: [phone] ? ?[place], [place] ?[place] ?[place] [address] ?Phone: [phone] Fax: [phone] ? ?[place], [place] [address] ?[phone] ?[place] [address] ?Phone: [phone] Fax: [phone] ? ? ? ? ?Social Determinants of Health (SDOH) Interventions ?  ? ?Readmission Risk Interventions ?No flowsheet data found. ? ?Emeterio Reeve, LCSW ?Clinical Social Worker ? ?

## 2021-07-09 NOTE — Progress Notes (Signed)
Patient's wife is concerned about the patient's R pinky toe. She stated that it looks redenned and pressing against the cast. Patient is able to wiggle his pinky toe, has good sensation, and the redness is blanchable. Patient denies any pain on that toe this time. Patient's wife want the MD to take a look at it. This Probation officer put a barrier(gauze) on it wont be pressing against his pinky toe.  ? ?Call bell within reach and will continue to monitor. ?

## 2021-07-09 NOTE — NC FL2 (Signed)
?[place] MEDICAID FL2 LEVEL OF CARE SCREENING TOOL  ?  ? ?IDENTIFICATION  ?Patient Name: ?Edward Waller Birthdate: 02/17/1933 Sex: male Admission Date (Current Location): ?07/06/2021  ?[place] and [place] Number: ? [place] ?  Facility and Address:  ?[facility], [place] [address] [place], [place] [address] ?     Provider Number: ?[id number]  ?Attending Physician Name and Address:  ?Aline August, MD ? Relative Name and Phone Number:  ?Roxanne Orner, [phone] ?   ?Current Level of Care: ?Hospital Recommended Level of Care: ?[place] Prior Approval Number: ?  ? ?Date Approved/Denied: ?  PASRR Number: ?[id number] A ? ?Discharge Plan: ?SNF ?  ? ?Current Diagnoses: ?Patient Active Problem List  ? Diagnosis Date Noted  ? Leukocytosis 07/07/2021  ? Right tibial and fibular fracture 07/06/2021  ? Sternal fracture 07/06/2021  ? Orbital fracture ([place]) 07/06/2021  ? Elevated blood pressure reading 07/06/2021  ? Encounter to establish care 05/16/2020  ? Hypothyroidism 05/16/2020  ? BPH (benign prostatic hyperplasia) 05/16/2020  ? Closed left ankle fracture 05/16/2020  ? Restless legs 05/16/2020  ? Mass of right lower leg 05/16/2020  ? ? ?Orientation RESPIRATION BLADDER Height & Weight   ?  ?Self, Time, Situation, Place ? Normal Continent, External catheter Weight: 175 lb 14.8 oz (79.8 kg) ?Height:  6' (182.9 cm)  ?BEHAVIORAL SYMPTOMS/MOOD NEUROLOGICAL BOWEL NUTRITION STATUS  ?    Continent Diet (See DC summary)  ?AMBULATORY STATUS COMMUNICATION OF NEEDS Skin   ?Limited Assist Verbally Surgical wounds, Skin abrasions (L Leg Surgicla Incision, L side abrasions) ?  ?  ?  ?    ?     ?     ? ? ?Personal Care Assistance Level of Assistance  ?Bathing, Dressing, Feeding Bathing Assistance: Limited assistance ?Feeding assistance: Independent ?Dressing Assistance: Limited assistance ?   ? ?Functional Limitations Info  ?Sight, Hearing, Speech Sight Info: Adequate ?Hearing Info:  Adequate ?Speech Info: Adequate  ? ? ?SPECIAL CARE FACTORS FREQUENCY  ?PT (By licensed PT), OT (By licensed OT)   ?  ?PT Frequency: 5x week ?OT Frequency: 5x week ?  ?  ?  ?   ? ? ?Contractures Contractures Info: Not present  ? ? ?Additional Factors Info  ?Code Status, Allergies Code Status Info: Partial ?Allergies Info: NKA ?  ?  ?  ?   ? ?Current Medications (07/09/2021):  This is the current hospital active medication list ?Current Facility-Administered Medications  ?Medication Dose Route Frequency Provider Last Rate Last Admin  ? acetaminophen (TYLENOL) tablet 1,000 mg  1,000 mg Oral Q8H Ainsley Spinner, PA-C   1,000 mg at 07/09/21 2703  ? docusate sodium (COLACE) capsule 100 mg  100 mg Oral BID Ainsley Spinner, PA-C   100 mg at 07/09/21 1022  ? enoxaparin (LOVENOX) injection 40 mg  40 mg Subcutaneous Q24H Ainsley Spinner, PA-C   40 mg at 07/09/21 1022  ? ketorolac (TORADOL) 15 MG/ML injection 7.5 mg  7.5 mg Intravenous Q6H Ainsley Spinner, PA-C   7.5 mg at 07/09/21 0507  ? levothyroxine (SYNTHROID) tablet 50 mcg  50 mcg Oral QAC breakfast Orma Flaming, MD   50 mcg at 07/09/21 0507  ? magnesium oxide (MAG-OX) tablet 400 mg  400 mg Oral q morning Orma Flaming, MD   400 mg at 07/08/21 0830  ? metoCLOPramide (REGLAN) tablet 5-10 mg  5-10 mg Oral Q8H PRN Ainsley Spinner, PA-C      ? Or  ? metoCLOPramide (REGLAN) injection 5-10 mg  5-10 mg Intravenous Q8H PRN Ainsley Spinner, PA-C      ? morphine (PF) 2 MG/ML injection 1 mg  1 mg Intravenous Q3H PRN Ainsley Spinner, PA-C      ? multivitamin with minerals tablet 1 tablet  1 tablet Oral q morning Orma Flaming, MD   1 tablet at 07/09/21 1022  ? ondansetron (ZOFRAN) tablet 4 mg  4 mg Oral Q6H PRN Ainsley Spinner, PA-C      ? Or  ? ondansetron (ZOFRAN) injection 4 mg  4 mg Intravenous Q6H PRN Ainsley Spinner, PA-C      ? oxyCODONE (Oxy IR/ROXICODONE) immediate release tablet 5-10 mg  5-10 mg Oral Q4H PRN Ainsley Spinner, PA-C      ? polyethylene glycol (MIRALAX / GLYCOLAX) packet 17 g  17 g Oral BID  Mariel Aloe, MD   17 g at 07/09/21 1021  ? rOPINIRole (REQUIP) tablet 1 mg  1 mg Oral QHS Mariel Aloe, MD   1 mg at 07/08/21 2031  ? senna-docusate (Senokot-S) tablet 1 tablet  1 tablet Oral QHS PRN Ainsley Spinner, PA-C      ? ? ? ?Discharge Medications: ?Please see discharge summary for a list of discharge medications. ? ?Relevant Imaging Results: ? ?Relevant Lab Results: ? ? ?Additional Information ?SS# [phone] ? ?Coralee Pesa, LCSWA ? ? ? ? ?
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
## 2021-07-10 DIAGNOSIS — D72829 Elevated white blood cell count, unspecified: Secondary | ICD-10-CM | POA: Diagnosis not present

## 2021-07-10 DIAGNOSIS — S82201A Unspecified fracture of shaft of right tibia, initial encounter for closed fracture: Secondary | ICD-10-CM | POA: Diagnosis not present

## 2021-07-10 DIAGNOSIS — S0285XA Fracture of orbit, unspecified, initial encounter for closed fracture: Secondary | ICD-10-CM | POA: Diagnosis not present

## 2021-07-10 DIAGNOSIS — E039 Hypothyroidism, unspecified: Secondary | ICD-10-CM | POA: Diagnosis not present

## 2021-07-10 NOTE — Progress Notes (Signed)
° °  Inpatient Rehab Admissions Coordinator : ° °Per therapy change in recommendations, patient was screened for CIR candidacy by [place] Agent RN MSN.  At this time patient appears to be a potential candidate for CIR. I will place a rehab consult per protocol for full assessment. Please call me with any questions. ° °Kennedy Bohanon RN MSN °Admissions Coordinator °[phone] °  °
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
## 2021-07-10 NOTE — Progress Notes (Signed)
Mobility Specialist Progress Note: ? ? 07/10/21 1022  ?Mobility  ?Activity Ambulated with assistance in room  ?Level of Assistance Contact guard assist, steadying assist  ?Assistive Device Front wheel walker  ?RLE Weight Bearing NWB  ?Distance Ambulated (ft) 70 ft  ?Activity Response Tolerated well  ?$Mobility charge 1 Mobility  ? ?Pt received in chair willing to participate in mobility. No complaints of pain. Left in chair with call bell in reach and all needs met.  ? ?Edward Waller ?Mobility Specialist ?Primary Phone [phone] ? ?
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
## 2021-07-10 NOTE — Progress Notes (Signed)
Offered several times to assist in getting patient back in the bed I was informed by family that they would do this and that they have been doing this while admitted. Will continue to monitor ?
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
## 2021-07-11 ENCOUNTER — Encounter (HOSPITAL_COMMUNITY): Payer: Self-pay | Admitting: Orthopedic Surgery
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
## 2021-07-11 NOTE — Plan of Care (Signed)

## 2021-07-11 NOTE — Progress Notes (Signed)
Inpatient Rehab Admissions Coordinator:  ? ?Met with pt and his family at bedside to discuss CIR recommendations and goals/expectations of CIR stay.  We reviewed 3 hrs/day of therapy, physiatry f/u, and average length of stay 2 weeks.  Family on board, pt would likely need to be supervision or better to d/c home, and I think given PLOF, CLOF, and motivation this is reasonable.  His wife is home with him 24/7  but cannot provide physical assist for mobility (can for ADLs, if needed).  I reviewed need for insurance authorization and I will start this process today.   ? ?Shann Medal, PT, DPT ?Admissions Coordinator ?[phone] ?07/11/21  ?12:19 PM ? ?

## 2021-07-12 ENCOUNTER — Inpatient Hospital Stay (HOSPITAL_COMMUNITY)
Admission: RE | Admit: 2021-07-12 | Discharge: 2021-07-24 | DRG: 560 | Disposition: A | Discharge status: Home Health | Payer: No Typology Code available for payment source | Source: Intra-hospital | Attending: Physical Medicine and Rehabilitation | Admitting: Physical Medicine and Rehabilitation

## 2021-07-12 ENCOUNTER — Encounter (HOSPITAL_COMMUNITY): Payer: Self-pay | Admitting: Physical Medicine and Rehabilitation

## 2021-07-12 ENCOUNTER — Encounter (HOSPITAL_COMMUNITY): Payer: Self-pay | Admitting: Family Medicine

## 2021-07-12 ENCOUNTER — Other Ambulatory Visit: Payer: Self-pay

## 2021-07-12 DIAGNOSIS — S82491D Other fracture of shaft of right fibula, subsequent encounter for closed fracture with routine healing: Secondary | ICD-10-CM | POA: Diagnosis not present

## 2021-07-12 DIAGNOSIS — S0231XD Fracture of orbital floor, right side, subsequent encounter for fracture with routine healing: Secondary | ICD-10-CM | POA: Diagnosis not present

## 2021-07-12 DIAGNOSIS — I10 Essential (primary) hypertension: Secondary | ICD-10-CM | POA: Diagnosis present

## 2021-07-12 DIAGNOSIS — E039 Hypothyroidism, unspecified: Secondary | ICD-10-CM | POA: Diagnosis present

## 2021-07-12 DIAGNOSIS — S2220XD Unspecified fracture of sternum, subsequent encounter for fracture with routine healing: Secondary | ICD-10-CM | POA: Diagnosis not present

## 2021-07-12 DIAGNOSIS — K5901 Slow transit constipation: Secondary | ICD-10-CM | POA: Diagnosis not present

## 2021-07-12 DIAGNOSIS — T1490XA Injury, unspecified, initial encounter: Secondary | ICD-10-CM | POA: Diagnosis present

## 2021-07-12 DIAGNOSIS — S82201S Unspecified fracture of shaft of right tibia, sequela: Secondary | ICD-10-CM | POA: Diagnosis not present

## 2021-07-12 DIAGNOSIS — R3915 Urgency of urination: Secondary | ICD-10-CM | POA: Diagnosis not present

## 2021-07-12 DIAGNOSIS — S82201A Unspecified fracture of shaft of right tibia, initial encounter for closed fracture: Secondary | ICD-10-CM | POA: Diagnosis present

## 2021-07-12 DIAGNOSIS — G2581 Restless legs syndrome: Secondary | ICD-10-CM | POA: Diagnosis present

## 2021-07-12 DIAGNOSIS — K5900 Constipation, unspecified: Secondary | ICD-10-CM | POA: Diagnosis present

## 2021-07-12 DIAGNOSIS — Z79899 Other long term (current) drug therapy: Secondary | ICD-10-CM | POA: Diagnosis not present

## 2021-07-12 DIAGNOSIS — S82291D Other fracture of shaft of right tibia, subsequent encounter for closed fracture with routine healing: Secondary | ICD-10-CM | POA: Diagnosis present

## 2021-07-12 DIAGNOSIS — N401 Enlarged prostate with lower urinary tract symptoms: Secondary | ICD-10-CM | POA: Diagnosis present

## 2021-07-12 DIAGNOSIS — S2220XA Unspecified fracture of sternum, initial encounter for closed fracture: Secondary | ICD-10-CM | POA: Diagnosis present

## 2021-07-12 DIAGNOSIS — D62 Acute posthemorrhagic anemia: Secondary | ICD-10-CM | POA: Diagnosis present

## 2021-07-12 DIAGNOSIS — L539 Erythematous condition, unspecified: Secondary | ICD-10-CM

## 2021-07-12 DIAGNOSIS — N400 Enlarged prostate without lower urinary tract symptoms: Secondary | ICD-10-CM | POA: Diagnosis present

## 2021-07-12 DIAGNOSIS — S0285XA Fracture of orbit, unspecified, initial encounter for closed fracture: Secondary | ICD-10-CM | POA: Diagnosis present

## 2021-07-12 MED ORDER — ROPINIROLE HCL 1 MG PO TABS
1.0000 mg | ORAL_TABLET | Freq: Every day | ORAL | Status: DC
Start: 2021-07-12 — End: 2021-07-14
  Administered 2021-07-12 – 2021-07-13 (×2): 1 mg via ORAL
  Filled 2021-07-12 (×2): qty 1

## 2021-07-12 MED ORDER — PROCHLORPERAZINE MALEATE 5 MG PO TABS: 5.0000 mg | ORAL_TABLET | Freq: Four times a day (QID) | ORAL | Status: DC | PRN

## 2021-07-12 MED ORDER — ACETAMINOPHEN 325 MG PO TABS
650.0000 mg | ORAL_TABLET | Freq: Three times a day (TID) | ORAL | Status: DC
  Administered 2021-07-12 – 2021-07-24 (×43): 650 mg via ORAL
  Filled 2021-07-12 (×44): qty 2

## 2021-07-12 MED ORDER — ALUM & MAG HYDROXIDE-SIMETH 200-200-20 MG/5ML PO SUSP: 30.0000 mL | ORAL | Status: DC | PRN

## 2021-07-12 MED ORDER — ASCORBIC ACID 500 MG PO TABS
500.0000 mg | ORAL_TABLET | Freq: Every day | ORAL | Status: DC
  Administered 2021-07-13 – 2021-07-24 (×12): 500 mg via ORAL
  Filled 2021-07-12 (×12): qty 1

## 2021-07-12 MED ORDER — TAMSULOSIN HCL 0.4 MG PO CAPS
0.4000 mg | ORAL_CAPSULE | Freq: Every day | ORAL | Status: DC
  Administered 2021-07-12 – 2021-07-23 (×12): 0.4 mg via ORAL
  Filled 2021-07-12 (×12): qty 1

## 2021-07-12 MED ORDER — TRAZODONE HCL 50 MG PO TABS
25.0000 mg | ORAL_TABLET | Freq: Every evening | ORAL | Status: DC | PRN
  Administered 2021-07-14 – 2021-07-17 (×3): 50 mg via ORAL
  Filled 2021-07-12 (×4): qty 1

## 2021-07-12 MED ORDER — MAGNESIUM OXIDE -MG SUPPLEMENT 400 (240 MG) MG PO TABS
400.0000 mg | ORAL_TABLET | Freq: Every morning | ORAL | Status: DC
  Administered 2021-07-13 – 2021-07-24 (×11): 400 mg via ORAL
  Filled 2021-07-12 (×12): qty 1

## 2021-07-12 MED ORDER — POLYETHYLENE GLYCOL 3350 17 G PO PACK: 17.0000 g | PACK | Freq: Every day | ORAL | Status: DC | PRN

## 2021-07-12 MED ORDER — DIPHENHYDRAMINE HCL 12.5 MG/5ML PO ELIX: 12.5000 mg | ORAL_SOLUTION | Freq: Four times a day (QID) | ORAL | Status: DC | PRN

## 2021-07-12 MED ORDER — ADULT MULTIVITAMIN W/MINERALS CH
1.0000 | ORAL_TABLET | Freq: Every morning | ORAL | Status: DC
  Administered 2021-07-13 – 2021-07-24 (×12): 1 via ORAL
  Filled 2021-07-12 (×12): qty 1

## 2021-07-12 MED ORDER — OXYCODONE HCL 5 MG PO TABS
5.0000 mg | ORAL_TABLET | ORAL | Status: DC | PRN
  Filled 2021-07-12: qty 1

## 2021-07-12 MED ORDER — POLYETHYLENE GLYCOL 3350 17 G PO PACK
17.0000 g | PACK | Freq: Two times a day (BID) | ORAL | Status: DC
  Filled 2021-07-12 (×2): qty 1

## 2021-07-12 MED ORDER — GUAIFENESIN-DM 100-10 MG/5ML PO SYRP: 5.0000 mL | ORAL_SOLUTION | Freq: Four times a day (QID) | ORAL | Status: DC | PRN

## 2021-07-12 MED ORDER — MAGNESIUM HYDROXIDE 400 MG/5ML PO SUSP: 30.0000 mL | Freq: Every day | ORAL | Status: DC | PRN

## 2021-07-12 MED ORDER — PRAMIPEXOLE DIHYDROCHLORIDE 0.125 MG PO TABS
0.1250 mg | ORAL_TABLET | Freq: Every morning | ORAL | Status: DC
  Administered 2021-07-13 – 2021-07-22 (×10): 0.125 mg via ORAL
  Filled 2021-07-12 (×10): qty 1

## 2021-07-12 MED ORDER — SENNOSIDES-DOCUSATE SODIUM 8.6-50 MG PO TABS
2.0000 | ORAL_TABLET | Freq: Every day | ORAL | Status: DC
  Administered 2021-07-12: 2 via ORAL
  Filled 2021-07-12: qty 2

## 2021-07-12 MED ORDER — BISACODYL 10 MG RE SUPP: 10.0000 mg | Freq: Every day | RECTAL | Status: DC | PRN

## 2021-07-12 MED ORDER — PROCHLORPERAZINE EDISYLATE 10 MG/2ML IJ SOLN: 5.0000 mg | Freq: Four times a day (QID) | INTRAMUSCULAR | Status: DC | PRN

## 2021-07-12 MED ORDER — FLEET ENEMA 7-19 GM/118ML RE ENEM: 1.0000 | ENEMA | Freq: Once | RECTAL | Status: DC | PRN

## 2021-07-12 MED ORDER — PROCHLORPERAZINE 25 MG RE SUPP: 12.5000 mg | Freq: Four times a day (QID) | RECTAL | Status: DC | PRN

## 2021-07-12 MED ORDER — LIDOCAINE HCL URETHRAL/MUCOSAL 2 % EX GEL
CUTANEOUS | Status: DC | PRN
Start: 2021-07-12 — End: 2021-07-24

## 2021-07-12 MED ORDER — LEVOTHYROXINE SODIUM 50 MCG PO TABS
50.0000 ug | ORAL_TABLET | Freq: Every day | ORAL | Status: DC
  Administered 2021-07-13 – 2021-07-20 (×8): 50 ug via ORAL
  Filled 2021-07-12 (×8): qty 1

## 2021-07-12 MED ORDER — ACETAMINOPHEN 325 MG PO TABS
325.0000 mg | ORAL_TABLET | ORAL | Status: DC | PRN
  Administered 2021-07-13: 325 mg via ORAL
  Administered 2021-07-21: 650 mg via ORAL
  Filled 2021-07-12: qty 2

## 2021-07-12 MED ORDER — ENOXAPARIN SODIUM 40 MG/0.4ML IJ SOSY
40.0000 mg | PREFILLED_SYRINGE | INTRAMUSCULAR | Status: DC
  Administered 2021-07-13 – 2021-07-24 (×12): 40 mg via SUBCUTANEOUS
  Filled 2021-07-12 (×12): qty 0.4
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
## 2021-07-12 NOTE — Progress Notes (Signed)
Inpatient Rehabilitation Admission Medication Review by a Pharmacist ? ?A complete drug regimen review was completed for this patient to identify any potential clinically significant medication issues. ? ?High Risk Drug Classes Is patient taking? Indication by Medication  ?Antipsychotic Yes Compazine prn N/V  ?Anticoagulant Yes Lovenox for VTE ppx  ?Antibiotic No   ?Opioid Yes Oxycodone for pain  ?Antiplatelet No   ?Hypoglycemics/insulin No   ?Vasoactive Medication No   ?Chemotherapy No   ?Other Yes Synthroid for low thyroid ?Requip for RLS  ? ? ? ?Type of Medication Issue Identified Description of Issue Recommendation(s)  ?Drug Interaction(s) (clinically significant) ?    ?Duplicate Therapy ?    ?Allergy ?    ?No Medication Administration End Date ?    ?Incorrect Dose ?    ?Additional Drug Therapy Needed ?    ?Significant med changes from prior encounter (inform family/care partners about these prior to discharge).    ?Other ?    ? ? ?Clinically significant medication issues were identified that warrant physician communication and completion of prescribed/recommended actions by midnight of the next day:  No ? ?Pharmacist comments: None ? ?Time spent performing this drug regimen review (minutes):  20 minutes ? ? ?Tad Moore ?07/12/2021 12:35 PM ?

## 2021-07-12 NOTE — Progress Notes (Signed)
Pt discharged to inpatient rehab this am ?

## 2021-07-12 NOTE — Progress Notes (Signed)
Mobility Specialist Progress Note: ? ? 07/12/21 1030  ?Mobility  ?Activity Ambulated with assistance in room  ?Level of Assistance Contact guard assist, steadying assist  ?Assistive Device Front wheel walker  ?RLE Weight Bearing NWB  ?Distance Ambulated (ft) 80 ft  ?Activity Response Tolerated well  ?$Mobility charge 1 Mobility  ? ?Pt received in bed willing to participate in mobility. No complaints of pain. Pt left in chair with call bell in reach and all needs met.  ? ?Kessie Croston ?Mobility Specialist ?Primary Phone [phone] ? ?
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
## 2021-07-12 NOTE — Progress Notes (Signed)
Inpatient Rehab Admissions Coordinator:   ? ?I have insurance approval and a bed available for pt to admit to CIR today. Dr. Dwyane Dee in agreement.  Will let pt/family and TOC team know.  ? ?Shann Medal, PT, DPT ?Admissions Coordinator ?[phone] ?07/12/21  ?10:02 AM  ? ?

## 2021-07-12 NOTE — Progress Notes (Signed)
INPATIENT REHABILITATION ADMISSION NOTE ? ? ?Arrival Method:bed ? ?   ?Mental Orientation:alert ? ? ?Assessment:done ? ? ?Skin:done with Erline Levine RN ? ? ?IV'S:left forearm ? ? ?Pain:none ? ? ?Tubes and Drains:none ? ? ?Safety Measures:done ? ? ?Vital Signs: ?done ? ?Height and Weight: ?done ? ?Rehab Orientation:done ? ? ?Family:wife and daughter ? ? ? ?Notes:  ?

## 2021-07-12 NOTE — H&P (Signed)
?  ?Physical Medicine and Rehabilitation Admission H&P ?  ?  ?   ?Chief Complaint  ?Patient presents with  ? Functional deficits due to fall and polytrauma.   ?  ?  ?HPI: Edward Waller is an 88 year old male with history of BPH and hypothyroidism otherwise in good health; who was admitted on 07/06/21 after being struck by garbage truck arm lever with subsequent fall and inability to stand up. No LOC, CP or dizziness reported. He was found to have right tib fib fracture and underwent  IM nail of right tibia by Dr. Marcelino Scot. RLE splinted and to be NWB X 6 weeks. He was also found to have scalp contusion and CT head negative.  CT thoracic spine showed minimally displaced mid sternal fracture s/o pathological Fx and non-emergent bone scan recommended for work up. 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 ?  ?Hospital course significant for reactive leucocytosis that has resolved and ABLA with drop in Hgb 16.7-->12.6 which is being monitored. Blood pressures noted to be labile this am but reporting poor sleep due to nocturia. Therapy has been working with patient and he continues to be limited by NWB RLE with decreased balance and endurance CIR recommended due to functional decline.  ?  ?Pt reports voiding 3-8x/night- cannot fully empty he thinks.  ?Asking for condom cath at night due to difficulty with urinal in bed  ?Used Pramipexole 0.125 mg nightly- wants it increased if possible- having Sx's all day now.  ?  ?  ?  ?Review of Systems  ?Constitutional:  Negative for chills and fever.  ?Genitourinary:  Positive for frequency and urgency. Negative for dysuria and hematuria.  ?Musculoskeletal:  Positive for joint pain and myalgias.  ?Skin:  Negative for rash.  ?Psychiatric/Behavioral:  The patient has insomnia.   ?All other systems reviewed and are negative. ?  ?  ?     ?Past Medical History:  ?Diagnosis Date  ? Cataract    ?  bilateral  ? Fx ankle    ?  left; Dec 2021  ? 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    ? Spinal stenosis    ? Thyroid disease    ?  ?  ?     ?Past Surgical History:  ?Procedure Laterality Date  ? BACK SURGERY      ? TIBIA IM NAIL INSERTION Right 07/06/2021  ?  Procedure: INTRAMEDULLARY (IM) NAIL TIBIAL;  Surgeon: Altamese , MD;  Location: [place];  Service: Orthopedics;  Laterality: Right;  ?  ?  ?History reviewed. No pertinent family history. ?  ?  ?Social History:  Married. Independent PTA> Wife is retired Therapist, sports. He  reports that he has never smoked. He has never used smokeless tobacco. He reports that he does not drink alcohol and does not use drugs. ?  ?  ?Allergies: No Known Allergies ?  ?  ?      ?Medications Prior to Admission  ?Medication Sig Dispense Refill  ? Ascorbic Acid (VITAMIN C PO) Take 1 tablet by mouth every morning.      ? Cholecalciferol (VITAMIN D3 PO) Take 1 tablet by mouth at bedtime.      ? Flaxseed, Linseed, (FLAX SEED OIL PO) Take 1 capsule by mouth at bedtime.      ? levothyroxine (SYNTHROID) 50 MCG tablet Take 50 mcg by mouth daily before  breakfast.      ? Magnesium Oxide 420 (252 Mg) MG TABS Take 420 mg by mouth every morning.      ? Multiple Vitamin (MULTIVITAMIN WITH MINERALS) TABS tablet Take 1 tablet by mouth every morning.      ? pramipexole (MIRAPEX) 0.125 MG tablet Take 0.125 mg by mouth at bedtime.      ? Saw Palmetto, Serenoa repens, (SAW PALMETTO PO) Take 1 tablet by mouth at bedtime.      ? rOPINIRole (REQUIP) 0.5 MG tablet Take 0.5 mg by mouth at bedtime. (Patient not taking: Reported on 07/06/2021)      ?  ?  ?  ?Home: ?Home Living ?Family/patient expects to be discharged to:: Private residence ?Living Arrangements: Spouse/significant other ?Available Help at Discharge: Family, Available 24 hours/day ?Type of Home: House ?Home Access: Stairs to enter ?Entrance Stairs-Number of Steps: 1-2 ?Entrance  Stairs-Rails: None ?Home Layout: One level ?Bathroom Shower/Tub: Walk-in shower ?Bathroom Toilet: Handicapped height ?Home Equipment: Other (comment), BSC/3in1 (upright walker) ?  ?Functional History: ?Prior Function ?Prior Level of Function : Independent/Modified Independent, Driving ?Mobility Comments: Amb without assistive device ?  ?Functional Status:  ?Mobility: ?Bed Mobility ?Overal bed mobility: Needs Assistance ?Bed Mobility: Sit to Supine ?Supine to sit: Min guard ?Sit to supine: Min assist, Mod assist ?General bed mobility comments: Pt. in chair upon PT arrival ?Transfers ?Overall transfer level: Needs assistance ?Equipment used: Rolling walker (2 wheels) ?Transfers: Sit to/from Stand ?Sit to Stand: Min assist (Min A from lower surfaces, can be [employer] from elevated) ?Bed to/from chair/wheelchair/BSC transfer type:: Step pivot ?Step pivot transfers: Min guard ?General transfer comment: 1 episode of posterior LOB while trasnfering from EOB, required SPT to provide anterior weight shift to correct. From chair to bed. From chair to wheel chair. Should be cued for "nose over toes" to promote anterior weight shift during STS. ?Ambulation/Gait ?Ambulation/Gait assistance: Min assist ?Gait Distance (Feet): 15 Feet ?Assistive device: Rolling walker (2 wheels) ?Gait Pattern/deviations: Step-to pattern (hop-to) ?General Gait Details: Verbal and demo cues to remind RW sequecing. VC for directions during transfers of turning. Pt. was able to walk throughout room and navigate around multiple people. Cued for relaxation of hands while walking to ease wrist discomfort. ?Gait velocity: reduced ?Gait velocity interpretation: <1.31 ft/sec, indicative of household ambulator ?Pre-gait activities: standing balance correction to get NWB controlled ?  ?ADL: ?ADL ?Overall ADL's : Needs assistance/impaired ?Eating/Feeding: Independent, Sitting ?Grooming: Set up ?Upper Body Bathing: Set up, Sitting ?Lower Body Bathing: Minimal  assistance, Sitting/lateral leans ?Upper Body Dressing : Set up, Sitting ?Lower Body Dressing: Moderate assistance, Sit to/from stand ?Lower Body Dressing Details (indicate cue type and reason): continued to educate patient and wife on lower body dressing, wife bringing pair of shorts and able to complete with assist from wife to pull up ?Toilet Transfer: Minimal assistance (wc; mod A due to amount of VC adn set up/safety) ?Toilet Transfer Details (indicate cue type and reason): Patient is able to hop to/from bathroom, min A for safety and cues to back up until LE touching toilet. ?Toileting- Clothing Manipulation and Hygiene: Sitting/lateral lean, Sit to/from stand, Moderate assistance ?Functional mobility during ADLs: Minimal assistance, Rolling walker (2 wheels), Cueing for safety, Wheelchair (mod VC for use of wc) ?General ADL Comments: REquired mod VC for use of wc; wc is safeer option however once pt saw how he would need to complete a wc/toilet transfer he states that the wc would not fit in his bathroom. ?  ?Cognition: ?  Cognition ?Overall Cognitive Status: Within Functional Limits for tasks assessed ?Orientation Level: Oriented X4 ?Cognition ?Arousal/Alertness: Awake/alert ?Behavior During Therapy: [facility] for tasks assessed/performed, Restless ?Overall Cognitive Status: Within Functional Limits for tasks assessed ?General Comments: States he is talking more than normal, requires direction to stay on task ?  ?  ?Blood pressure (!) 149/101, pulse 85, temperature 98.2 ?F (36.8 ?C), temperature source Oral, resp. rate 19, height 6' (1.829 m), weight 79.8 kg, SpO2 97 %. ?Physical Exam ?Vitals and nursing note reviewed. Exam conducted with a chaperone present.  ?Constitutional:   ?   Appearance: Normal appearance.  ?   Comments: Elderly male sitting up in bed; wife at bedside; bladder scan 163 cc after voiding; wife at bedside, NAD  ?HENT:  ?   Head: Normocephalic.  ?   Comments: Scalp with dry crusted abrasion. ?R  orbit raccoon eye- associated bruising  ?   Nose: Nose normal. No congestion.  ?   Mouth/Throat:  ?   Mouth: Mucous membranes are dry.  ?   Pharynx: Oropharynx is clear. No oropharyngeal exudate.  ?Eyes:  ?   Genera

## 2021-07-13 LAB — CBC WITH DIFFERENTIAL/PLATELET
Abs Immature Granulocytes: 0.07 10*3/uL (ref 0.00–0.07)
Basophils Absolute: 0 10*3/uL (ref 0.0–0.1)
Basophils Relative: 0 %
Eosinophils Absolute: 0.1 10*3/uL (ref 0.0–0.5)
Eosinophils Relative: 2 %
HCT: 36.2 % — ABNORMAL LOW (ref 39.0–52.0)
Hemoglobin: 12.5 g/dL — ABNORMAL LOW (ref 13.0–17.0)
Immature Granulocytes: 1 %
Lymphocytes Relative: 10 %
Lymphs Abs: 0.8 10*3/uL (ref 0.7–4.0)
MCH: 33.2 pg (ref 26.0–34.0)
MCHC: 34.5 g/dL (ref 30.0–36.0)
MCV: 96.3 fL (ref 80.0–100.0)
Monocytes Absolute: 0.7 10*3/uL (ref 0.1–1.0)
Monocytes Relative: 8 %
Neutro Abs: 6.7 10*3/uL (ref 1.7–7.7)
Neutrophils Relative %: 79 %
Platelets: 214 10*3/uL (ref 150–400)
RBC: 3.76 MIL/uL — ABNORMAL LOW (ref 4.22–5.81)
RDW: 12.8 % (ref 11.5–15.5)
WBC: 8.4 10*3/uL (ref 4.0–10.5)
nRBC: 0 % (ref 0.0–0.2)

## 2021-07-13 LAB — COMPREHENSIVE METABOLIC PANEL
ALT: 102 U/L — ABNORMAL HIGH (ref 0–44)
AST: 77 U/L — ABNORMAL HIGH (ref 15–41)
Albumin: 3.1 g/dL — ABNORMAL LOW (ref 3.5–5.0)
Alkaline Phosphatase: 55 U/L (ref 38–126)
Anion gap: 5 (ref 5–15)
BUN: 17 mg/dL (ref 8–23)
CO2: 28 mmol/L (ref 22–32)
Calcium: 8.8 mg/dL — ABNORMAL LOW (ref 8.9–10.3)
Chloride: 105 mmol/L (ref 98–111)
Creatinine, Ser: 0.83 mg/dL (ref 0.61–1.24)
GFR, Estimated: >60 mL/min (ref >60)
Glucose, Bld: 140 mg/dL — ABNORMAL HIGH (ref 70–99)
Potassium: 4.7 mmol/L (ref 3.5–5.1)
Sodium: 138 mmol/L (ref 135–145)
Total Bilirubin: 1.3 mg/dL — ABNORMAL HIGH (ref 0.3–1.2)
Total Protein: 6.1 g/dL — ABNORMAL LOW (ref 6.5–8.1)

## 2021-07-13 LAB — MAGNESIUM: Magnesium: 2.2 mg/dL (ref 1.7–2.4)

## 2021-07-13 LAB — PSA: Prostatic Specific Antigen: 6.48 ng/mL — ABNORMAL HIGH (ref 0.00–4.00)

## 2021-07-13 MED ORDER — SENNA 8.6 MG PO TABS
1.0000 | ORAL_TABLET | Freq: Every day | ORAL | Status: DC
  Administered 2021-07-14 – 2021-07-18 (×4): 8.6 mg via ORAL
  Filled 2021-07-13 (×4): qty 1

## 2021-07-13 NOTE — Progress Notes (Addendum)
?   07/13/21 1245  ?Clinical Encounter Type  ?Visited With Patient;Family ?(Contacted Daughter: Edward Waller at patient's request)  ?Visit Type Spiritual support;Social support ?(Initially Spiritual Consult Request for Advance Directive)  ?Referral From Nurse ?(Edward Slade, RN)  ?Consult/Referral To Chaplain  ?Spiritual Encounters  ?Spiritual Needs Emotional  ?Stress Factors  ?Patient Stress Factors  ?Engineer, materials)  ? ?Actual visit began at 11:45. FGSSpiritual Care consultation request from patient's nurse, Edward Slade, RN, to contact Edward Waller. Edward Waller's daughter, Edward Waller at [phone] regarding patient's Advance Directive. ?Upon arrival Edward Waller was sitting up in chair and was tired from three morning sessions of physical therapy. Edward Waller shared concerns of frustration regarding staff responsiveness to his needs. He discussed his family, speaking of his wife who was a Designer, television/film set for twenty five years, and daughter. 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 Edward Waller asked me to contact his daughter, Edward Waller [phone] to schedule an appointment to provide Advance Directive Education. Phone contact was made with Ms. [name] and appointment time was set for 3:00 PM. Chaplain [name], M. Min., [phone]. ?

## 2021-07-13 NOTE — Progress Notes (Signed)
? 07/13/21 1615  ?Clinical Encounter Type  ?Visited With Patient and family together ?(Patient's wife and daughter)  ?Visit Type Follow-up ?Market researcher Education)  ?Referral From Nurse ?(Ander Slade, RN)  ?Consult/Referral To Chaplain  ?Advance Directives (For Healthcare)  ?Does Patient Have a Medical Advance Directive? No  ?Would patient like information on creating a medical advance directive? Yes (Inpatient - patient requests chaplain consult to create a medical advance directive)  ?Mental Health Advance Directives  ?Does Patient Have a Mental Health Advance Directive? No  ?Would patient like information on creating a mental health advance directive? Yes (Inpatient - patient requests chaplain consult to create a mental health advance directive)  ? ?Chaplain responded to a consult request for Advance Directive education.   ? ?Chaplain provided the Advance Directive packet as well as education on Advance Directives-documents an individual completes to communicate their health care directions in advance of a time when they may need them to Edward Waller and his wife and daughter, Edward Waller. Chaplain informed Dr. Annamaria Boots the documents which may be completed here in the hospital are the Living Will and [place].   ? ?Chaplain informed that the [place] is a legal document in which an individual names another person, their [place], to make health care decisions when the individual is not able to make them for themselves. The Health Care Agent's function can be temporary or permanent depending on his ability to make and communicate those decisions independently. Chaplain informed Dr. Annamaria Boots in the absence of a [place], the state of [place] directs health care providers to look to the following individuals in the order listed: legal guardian; an attorney-in-fact under a general power of attorney (POA) if that POA  includes the right to make health care decisions; his wife, Edward Waller; a 50 of his daughter, Ms. Edward Waller; a 44 of adult brothers and sisters; or an individual who has an established relationship with you, who is acting in good faith and who can convey your wishes.  If none of these person are available or willing to make medical decisions on a patient's behalf, the law allows the patient's doctor to make decisions for them as long as another doctor agrees with those decisions.  Chaplain also informed the patient that the Health Care agent has no decision-making authority over any affairs other than those related to his medical care.   ? ?The chaplain further educated the Dr. Annamaria Boots that a Living Will is a legal document that allows his desire not to receive life-prolonging measures in the event that they have a condition that is incurable and will result in his death in a short period of time; they are unconscious, and doctors are confident that they will not regain consciousness; and/or they have advanced dementia or other substantial and irreversible loss of mental function. The chaplain informed Dr. Annamaria Boots that life-prolonging measures are medical treatments that would only serve to postpone death, including breathing machines, kidney dialysis, antibiotics, artificial nutrition and hydration (tube feeding), and similar forms of treatment and that if an individual is able to express their wishes, they may also make them known without the use of a Living Will, but in the event that he is not able to express his wishes, a Living Will allows medical providers and the his family and friends ensure that they are not making decisions on the his behalf, but rather serving as  the his voice to convey decisions the he has already made.   ? ?Dr. Annamaria Boots is aware that the decision to create an advance directive is his alone and he may choose not to complete the documents or may choose to complete one portion or  both.  Dr. Annamaria Boots was informed that he can revoke the documents at any time by striking through them and writing void or by completing new documents, but that it is also advisable that the individual verbally notify interested parties that their wishes have changed.  ? ?Dr. Annamaria Boots is also aware that the document must be signed in the presence of a notary public and two witnesses and that this can be done while the patient is still admitted to the hospital or after discharge in the community. If they decide to complete Advance Directives after being discharged from the hospital, they have been advised to notify all interested parties and to provide those documents to their physicians and loved ones in addition to bringing them to the hospital in the event of another hospitalization.   ? ?The chaplain informed the Dr. Annamaria Boots that if he desires to proceed with completing Advance Directive Documentation while he is still admitted, notary services are typically available at [facility] between the hours of 1:00 and 3:30 Monday-Thursday.    ? ?When the patient is ready to have these documents completed, the patient should request that their nurse place a spiritual care consult and indicate that the patient is ready to have their advance directives notarized so that arrangements for witnesses and notary public can be made.  ? ?Please page spiritual care if the patient desires further education or has questions.     ? ? Susanne Greenhouse., [phone]  ?

## 2021-07-13 NOTE — Progress Notes (Signed)
Inpatient Rehabilitation Care Coordinator ?Assessment and Plan ?Patient Details  ?Name: Edward Waller ?MRN: [id number] ?Date of Birth: Mar 29, 1933 ? ?Today's Date: 07/13/2021 ? ?Hospital Problems: Principal Problem: ?  Trauma ?Active Problems: ?  Right tibial and fibular fracture ?  Sternal fracture ?  Orbital fracture ([place]) ? ?Past Medical History:  ?Past Medical History:  ?Diagnosis Date  ? Cataract   ? bilateral  ? Fx ankle   ? left; Dec 2021  ? Hip pain, chronic, right   ? Liver cyst   ? 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   ? Renal cyst, left   ? Restless leg syndrome   ? Spinal stenosis   ? Thyroid disease   ? ?Past Surgical History:  ?Past Surgical History:  ?Procedure Laterality Date  ? BACK SURGERY    ? TIBIA IM NAIL INSERTION Right 07/06/2021  ? Procedure: INTRAMEDULLARY (IM) NAIL TIBIAL;  Surgeon: Altamese Colony, MD;  Location: [place];  Service: Orthopedics;  Laterality: Right;  ? ?Social History:  reports that he has never smoked. He has never used smokeless tobacco. He reports that he does not drink alcohol and does not use drugs. ? ?Family / Support Systems ?Marital Status: Married ?How Long?: 64 years ?Patient Roles: Parent, Spouse ?Spouse/Significant Other: [place] (wife) [phone] ?Children: 3 children; 1 adult dtr living (lives in [place]). Pt dtr Joy recently passed 03/02/21 and they lost their son when he was 38 yrs old. ?Other Supports: None reported ?Anticipated Caregiver: Pt wife. Wife is a retired Therapist, sports. ?Ability/Limitations of Caregiver: Pt wife has back issues so unable to provide any physical assistance. ?Caregiver Availability: 24/7 ?Family Dynamics: Pt lives with his wife. ? 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 ?Education: Doctorate ?Health Literacy - How often do you need to have someone help you when you read instructions, pamphlets, or other written material from your doctor or  pharmacy?: Never ?Writes: Yes ?Employment Status: Retired ?Date Retired/Disabled/Unemployed: 2014 ?Legal History/Current Legal Issues: Denies ?Guardian/Conservator: N/A  ? ?Abuse/Neglect ?Abuse/Neglect Assessment Can Be Completed: Yes ?Physical Abuse: Denies ?Verbal Abuse: Denies ?Sexual Abuse: Denies ?Exploitation of patient/patient's resources: Denies ?Self-Neglect: Denies ? ?Patient response to: ?Social Isolation - How often do you feel lonely or isolated from those around you?: Never ? ?Emotional Status ?Pt's affect, behavior and adjustment status: Pt in good spirits at time of visit. ?Recent Psychosocial Issues: Denies ?Psychiatric History: Denies ?Substance Abuse History: Denies ? ?Patient / Family Perceptions, Expectations & Goals ?Pt/Family understanding of illness & functional limitations: Pt and wife have a general understanding of pt care needs ?Premorbid pt/family roles/activities: Independent ?Anticipated changes in roles/activities/participation: Assistance with ADLs/IADLs due to NWB of R leg. ?Pt/family expectations/goals: Pt goal " to get where I am freely mobile." Wife- " learning how to balance on right leg given his NWB restrictions." ? ?Community Resources ?Community Agencies: None ?Premorbid Home Care/DME Agencies: None ?Transportation available at discharge: Wife ?Is the patient able to respond to transportation needs?: Yes ?In the past 12 months, has lack of transportation kept you from medical appointments or from getting medications?: No ?In the past 12 months, has lack of transportation kept you from meetings, work, or from getting things needed for daily living?: No ? ?Discharge Planning ?Living Arrangements: Spouse/significant other ?Support Systems: Spouse/significant other ?Type of Residence: Private residence ?Insurance Resources: Multimedia programmer (specify) ([place]) ?Financial Resources: Social Security, Other (Comment) (savings/pension) ?Financial Screen Referred: No ?Living Expenses:  Own ?Money Management: Spouse ?Does the  patient have any problems obtaining your medications?: No ?Home Management: Pt and wife split home care duties ?Patient/Family Preliminary Plans: TBD ?Care Coordinator Barriers to Discharge: Decreased caregiver support, Lack of/limited family support ?Care Coordinator Barriers to Discharge Comments: Wife unabe to provide physical support. ?Care Coordinator Anticipated Follow Up Needs: HH/OP ? ?Clinical Impression ?SW met with pt in room to introduce self, explain role, and discuss discharge process. During assessment, pt wife arrived. 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 No HCPOA. DME: RW and w/c present in pt room. Pt and wife aware SW will f.u with continued updates.  ? ?SW sent H&P to [facility] waiting on updates on his service connection status.  ? ?[name] ?07/13/2021, 2:37 PM ? ?  ?

## 2021-07-13 NOTE — Evaluation (Addendum)
Physical Therapy Assessment and Plan ? ?Patient Details  ?Name: Edward Waller ?MRN: [id number] ?Date of Birth: Oct 24, 1932 ? ?PT Diagnosis: Abnormality of gait, Difficulty walking, Low back pain, and Muscle weakness ?Rehab Potential: Good ?ELOS: 7-10 minutes.  ? ?Today's Date: 07/13/2021 ?PT Individual Time: 7824-2353 ?PT Individual Time Calculation (min): 73 min   ? ?Hospital Problem: Principal Problem: ?  Trauma ?Active Problems: ?  Right tibial and fibular fracture ?  Sternal fracture ?  Orbital fracture ([place]) ? ? ?Past Medical History:  ?Past Medical History:  ?Diagnosis Date  ? Cataract   ? bilateral  ? Fx ankle   ? left; Dec 2021  ? Hip pain, chronic, right   ? Liver cyst   ? 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   ? Renal cyst, left   ? Restless leg syndrome   ? Spinal stenosis   ? Thyroid disease   ? ?Past Surgical History:  ?Past Surgical History:  ?Procedure Laterality Date  ? BACK SURGERY    ? TIBIA IM NAIL INSERTION Right 07/06/2021  ? Procedure: INTRAMEDULLARY (IM) NAIL TIBIAL;  Surgeon: Altamese Lavon, MD;  Location: [facility];  Service: Orthopedics;  Laterality: Right;  ? ? ?Assessment & Plan ?Clinical Impression: Edward Waller is an 88 year old male with history of BPH and hypothyroidism otherwise in good health; who was admitted on 07/06/21 after being struck by garbage truck arm lever with subsequent fall and inability to stand up. No LOC, CP or dizziness reported. He was found to have right tib fib fracture and underwent  IM nail of right tibia by Dr. Marcelino Scot. RLE splinted and to be NWB X 6 weeks. He was also found to have scalp contusion and CT head negative.  CT thoracic spine showed minimally displaced mid sternal fracture s/o pathological Fx and non-emergent bone scan recommended for work up. 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 ?   ?Hospital course significant for reactive leucocytosis that has resolved and ABLA with drop in Hgb 16.7-->12.6 which is being monitored. Blood pressures noted to be labile this am but reporting poor sleep due to nocturia. Therapy has been working with patient and he continues to be limited by NWB RLE with decreased balance and endurance CIR recommended due to functional decline.  ? ?Patient currently requires min with mobility secondary to muscle weakness and decreased standing balance.  Prior to hospitalization, patient was independent  with mobility and lived with Spouse in a House home.  Home access is 1-2Stairs to enter. ? ?Patient will benefit from skilled PT intervention to maximize safe functional mobility, minimize fall risk, and decrease caregiver burden for planned discharge home with 24 hour supervision.  Anticipate patient will benefit from follow up Donna at discharge. ? ?PT - End of Session ?Activity Tolerance: Tolerates 30+ min activity with multiple rests ?Endurance Deficit: Yes ?Endurance Deficit Description: required rest breaks w/ activity. ?PT Assessment ?Rehab Potential (ACUTE/IP ONLY): Good ?PT Barriers to Discharge: Lack of/limited family support (spouse unable to assist w/ any transfers.) ?PT Patient demonstrates impairments in the following area(s): Balance;Safety;Endurance;Motor ?PT Transfers Functional Problem(s): Bed Mobility;Bed to Chair;Car;Furniture ?PT Locomotion Functional Problem(s): Ambulation;Wheelchair Mobility;Stairs ?PT Plan ?PT Intensity: Minimum of 1-2 x/day ,45 to 90 minutes ?PT Frequency: 5 out of 7 days ?PT Duration Estimated Length of Stay: 7-10 minutes. ?PT Treatment/Interventions: Ambulation/gait training;Discharge planning;Functional mobility training;Therapeutic Activities;UE/LE Strength taining/ROM;Balance/vestibular training;Neuromuscular  re-education;Patient/family education;Stair training;Therapeutic Exercise;UE/LE Coordination activities;Wheelchair  propulsion/positioning ?PT Transfers Anticipated Outcome(s): supervision/mod I ?PT Locomotion Anticipated Outcome(s): supervision gait w/ RW, mod I for w/c mobility. ?PT Recommendation ?Follow Up Recommendations: Home health PT ?Patient destination: Home ?Equipment Details: TBD ? ? ?PT Evaluation ?Precautions/Restrictions ?Precautions ?Precautions: Fall ?Restrictions ?Weight Bearing Restrictions: Yes ?RLE Weight Bearing: Non weight bearing ?General ?Chart Reviewed: Yes ?Family/Caregiver Present: No Vital Signs ?Pain ?Pain Assessment ?Pain Scale: 0-10 ?Pain Score: 2  ?Pain Type: Acute pain ?Pain Location: Back ?Pain Descriptors / Indicators: Discomfort ?Pain Onset: On-going ?Pain Intervention(s): Medication (See eMAR) ?Pain Interference ?Pain Interference ?Pain Effect on Sleep: 1. Rarely or not at all ?Pain Interference with Therapy Activities: 1. Rarely or not at all ?Pain Interference with Day-to-Day Activities: 1. Rarely or not at all ?Home Living/Prior Functioning ?Home Living ?Available Help at Discharge: Family;Available 24 hours/day ?Type of Home: House ?Home Access: Stairs to enter ?Entrance Stairs-Number of Steps: 1-2 ?Entrance Stairs-Rails: None ?Home Layout: One level ?Bathroom Shower/Tub: Tub/shower unit;Walk-in shower;Door;Curtain (small lip to enter into walk in) ?Bathroom Toilet: Handicapped height ?Bathroom Accessibility: Yes ? Lives With: Spouse ?Prior Function ?Level of Independence: Independent with transfers;Independent with gait ? Able to Take Stairs?: Yes ?Driving: Yes ?Vocation: Retired ?Vision/Perception  ?Vision - History ?Ability to See in Adequate Light: 0 Adequate ?Perception ?Perception: Within Functional Limits ?Praxis ?Praxis: Intact  ?Cognition ?Overall Cognitive Status: Within Functional Limits for tasks assessed ?Arousal/Alertness: Awake/alert ?Orientation Level: Oriented X4 ?Memory: Appears intact ?Awareness: Appears intact ?Problem Solving: Appears intact ?Safety/Judgment: Appears  intact ?Sensation ?Sensation ?Light Touch: Appears Intact ?Hot/Cold: Appears Intact ?Proprioception: Appears Intact ?Stereognosis: Not tested ?Coordination ?Gross Motor Movements are Fluid and Coordinated: No ?Fine Motor Movements are Fluid and Coordinated: No ?Coordination and Movement Description: generalized deconditioning to weakness and WB precautions ?Finger Nose Finger Test: dysmetria, finger opposition limited due to bilateral arthritis but still discoordinated ?Heel Shin Test: UTA RLE 2/2 weakness ?Motor  ?Motor ?Motor: Within Functional Limits  ? ?Trunk/Postural Assessment  ?Cervical Assessment ?Cervical Assessment: Within Functional Limits ?Thoracic Assessment ?Thoracic Assessment: Within Functional Limits ?Lumbar Assessment ?Lumbar Assessment: Within Functional Limits  ?Balance ?Balance ?Balance Assessed: Yes ?Static Standing Balance ?Static Standing - Balance Support: Bilateral upper extremity supported ?Static Standing - Level of Assistance: 4: Min assist ?Extremity Assessment  ?RUE Assessment ?RUE Assessment: Within Functional Limits ?LUE Assessment ?LUE Assessment: Within Functional Limits ?RLE Assessment ?RLE Assessment: Exceptions to [facility] ?General Strength Comments: knee extension grossly 3/5, although states pain to R hip, ankle NT 2/2 casted. ?LLE Assessment ?LLE Assessment: Within Functional Limits ? ?Care Tool ?Care Tool Bed Mobility ?Roll left and right activity   ?  ?   ?Sit to lying activity   ?Sit to lying assist level: Contact Guard/Touching assist ?   ?Lying to sitting on side of bed activity   ?Lying to sitting on side of bed assist level: the ability to move from lying on the back to sitting on the side of the bed with no back support.: Contact Guard/Touching assist ?   ? ?Care Tool Transfers ?Sit to stand transfer   ?Sit to stand assist level: Minimal Assistance - Patient > 75% ?   ?Chair/bed transfer   ?Chair/bed transfer assist level: Minimal Assistance - Patient > 75% ?   ? Toilet  transfer   ?Assist Level: Minimal Assistance - Patient > 75% ?   ?Car transfer   ?Car transfer assist level: Minimal Assistance - Patient > 75% ?   ?  ?Care Tool Locomotion ?Ambulation   ?  Assist level: Minimal Assistance -

## 2021-07-13 NOTE — IPOC Note (Signed)
Overall Plan of Care (IPOC) ?Patient Details ?Name: Edward Waller ?MRN: [id number] ?DOB: 1933-01-12 ? ?Admitting Diagnosis: Trauma ? ?Hospital Problems: Principal Problem: ?  Trauma ?Active Problems: ?  Right tibial and fibular fracture ?  Sternal fracture ?  Orbital fracture ([place]) ? ? ? ? Functional Problem List: ?Nursing Edema, Endurance, Motor, Safety, Skin Integrity  ?PT Balance, Safety, Endurance, Motor  ?OT Balance, Safety, Endurance, Motor, Pain  ?SLP    ?TR    ?    ? Basic ADL?s: ?OT Bathing, Dressing, Toileting  ? ?  Advanced  ADL?s: ?OT Simple Meal Preparation  ?   ?Transfers: ?PT Bed Mobility, Bed to Chair, Car, Furniture  ?OT Toilet, Tub/Shower  ? ?  Locomotion: ?PT Ambulation, Wheelchair Mobility, Stairs  ? ?  Additional Impairments: ?OT None  ?SLP   ?  ?   ?TR    ? ? ?Anticipated Outcomes ?Item Anticipated Outcome  ?Self Feeding Indep  ?Swallowing ?   ?  ?Basic self-care ? Mod I to supervision  ?Toileting ? mod I ?  ?Bathroom Transfers Supervision  ?Bowel/Bladder ? n/a  ?Transfers ? supervision/mod I  ?Locomotion ? supervision gait w/ RW, mod I for w/c mobility.  ?Communication ?    ?Cognition ?    ?Pain ? n/a  ?Safety/Judgment ? mod I  ? ?Therapy Plan: ?PT Intensity: Minimum of 1-2 x/day ,45 to 90 minutes ?PT Frequency: 5 out of 7 days ?PT Duration Estimated Length of Stay: 7-10 minutes. ?OT Intensity: Minimum of 1-2 x/day, 45 to 90 minutes ?OT Frequency: 5 out of 7 days ?OT Duration/Estimated Length of Stay: 7-10 days ?   ? ?Due to the current state of emergency, patients may not be receiving their 3-hours of Medicare-mandated therapy. ? ? Team Interventions: ?Nursing Interventions Patient/Family Education, Skin Care/Wound Management, Discharge Planning  ?PT interventions Ambulation/gait training, Discharge planning, Functional mobility training, Therapeutic Activities, UE/LE Strength taining/ROM, Medical illustrator training, Neuromuscular re-education, Patient/family education, Stair training,  Therapeutic Exercise, UE/LE Coordination activities, Wheelchair propulsion/positioning  ?OT Interventions Balance/vestibular training, Discharge planning, Pain management, Self Care/advanced ADL retraining, Therapeutic Activities, UE/LE Coordination activities, Disease mangement/prevention, Functional mobility training, Patient/family education, Therapeutic Exercise, DME/adaptive equipment instruction, UE/LE Strength taining/ROM, Wheelchair propulsion/positioning  ?SLP Interventions    ?TR Interventions    ?SW/CM Interventions Discharge Planning, Psychosocial Support, Patient/Family Education  ? ?Barriers to Discharge ?MD  Medical stability, Home enviroment access/loayout, Wound care, Weight, and Weight bearing restrictions  ?Nursing Decreased caregiver support, Home environment access/layout, Wound Care, Lack of/limited family support, Weight bearing restrictions ?1 level, 1-2 steps, no rails. Spouse can provide supervision for mobility, min assist ADL's.  ?PT Lack of/limited family support (spouse unable to assist w/ any transfers.) ?   ?OT Home environment access/layout, Lack of/limited family support, Incontinence, Weight bearing restrictions ?   ?SLP   ?   ?SW Decreased caregiver support, Lack of/limited family support ?Wife unabe to provide physical support.  ? ?Team Discharge Planning: ?Destination: PT-Home ,OT- Home , SLP-  ?Projected Follow-up: PT-Home health PT, OT-  Home health OT, SLP-  ?Projected Equipment Needs: PT- , OT- 3 in 1 bedside comode, To be determined, SLP-  ?Equipment Details: PT-TBD, OT-  ?Patient/family involved in discharge planning: PT- Patient,  OT-Patient, SLP-  ? ?MD ELOS: 7-10 days ?Medical Rehab Prognosis:  Good ?Assessment: The patient has been admitted for CIR therapies with the diagnosis of L tib fib fx s/p IM nail NWB and orbital fx due ot fall . The team will be addressing functional  mobility, strength, stamina, balance, safety, adaptive techniques and equipment, self-care,  bowel and bladder mgt, patient and caregiver education, . Goals have been set at supervision. Anticipated discharge destination is home with wife. ? ?Due to the current state of emergency, patients may not be receiving their 3 hours per day of Medicare-mandated therapy.  ? ? ? ?  ? ? ?See Team Conference Notes for weekly updates to the plan of care ? ?

## 2021-07-13 NOTE — Progress Notes (Signed)
Inpatient Rehabilitation  Patient information reviewed and entered into eRehab system by Sotirios Navarro Eulice Rutledge, OTR/L.   Information including medical coding, functional ability and quality indicators will be reviewed and updated through discharge.    

## 2021-07-13 NOTE — Progress Notes (Signed)
Patient resting at short intervals throughout shift, verbalized discomfort of back pain with some surgical right leg pain request Tylenol 650 mg po , Request not to be repositioned at this time.Condom catheter as applied earlier during shift to allow for patient to rest. Right leg surgical dressing intact and placed in ortho boot.Continue regime.Continue   ?

## 2021-07-13 NOTE — Evaluation (Signed)
Occupational Therapy Assessment and Plan ? ?Patient Details  ?Name: Edward Waller ?MRN: [id number] ?Date of Birth: 07-13-1932 ? ?OT Diagnosis: abnormal posture, acute pain, muscle weakness (generalized), and pain in joint ?Rehab Potential: Rehab Potential (ACUTE ONLY): Good ?ELOS: 7-10 days  ? ?Today's Date: 07/13/2021 ?OT Individual Time: 8841-6606 ?OT Individual Time Calculation (min): 60 min    ? ?Hospital Problem: Principal Problem: ?  Trauma ?Active Problems: ?  Right tibial and fibular fracture ?  Sternal fracture ?  Orbital fracture ([place]) ? ? ?Past Medical History:  ?Past Medical History:  ?Diagnosis Date  ? Cataract   ? bilateral  ? Fx ankle   ? left; Dec 2021  ? Hip pain, chronic, right   ? Liver cyst   ? 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   ? Renal cyst, left   ? Restless leg syndrome   ? Spinal stenosis   ? Thyroid disease   ? ?Past Surgical History:  ?Past Surgical History:  ?Procedure Laterality Date  ? BACK SURGERY    ? TIBIA IM NAIL INSERTION Right 07/06/2021  ? Procedure: INTRAMEDULLARY (IM) NAIL TIBIAL;  Surgeon: Altamese Astoria, MD;  Location: [place];  Service: Orthopedics;  Laterality: Right;  ? ? ?Assessment & Plan ?Clinical Impression:  ? ? FERGUS THRONE is an 88 year old male with history of BPH and hypothyroidism otherwise in good health; who was admitted on 07/06/21 after being struck by garbage truck arm lever with subsequent fall and inability to stand up. No LOC, CP or dizziness reported. He was found to have right tib fib fracture and underwent  IM nail of right tibia by Dr. Marcelino Scot. RLE splinted and to be NWB X 6 weeks. He was also found to have scalp contusion and CT head negative.  CT thoracic spine showed minimally displaced mid sternal fracture s/o pathological Fx and non-emergent bone scan recommended for work up. CT spine showed multilevel DDD cervical spine with severe facet arthropathy C5/C6, mild to moderate disease of thoracic spine and dexoconvex curvature of L-spine  with severe multilevel disease. ?  ?Hospital course significant for reactive leucocytosis that has resolved and ABLA with drop in Hgb 16.7-->12.6 which is being monitored. Blood pressures noted to be labile this am but reporting poor sleep due to nocturia. Therapy has been working with patient and he continues to be limited by NWB RLE with decreased balance and endurance CIR recommended due to functional decline. Patient transferred to CIR on 07/12/2021 .   ? ?Patient currently requires min-mod A with basic self-care skills secondary to muscle weakness, decreased cardiorespiratoy endurance, and decreased coordination.  Prior to hospitalization, patient could complete all self-care independently. ? ?Patient will benefit from skilled intervention to increase independence with basic self-care skills prior to discharge home with care partner.  Anticipate patient will require intermittent supervision and follow up home health. ? ?OT - End of Session ?Activity Tolerance: Tolerates 10 - 20 min activity with multiple rests ?Endurance Deficit: Yes ?Endurance Deficit Description: required rest breaks during ADLs and functional transfers ?OT Assessment ?Rehab Potential (ACUTE ONLY): Good ?OT Barriers to Discharge: Home environment access/layout;Lack of/limited family support;Incontinence;Weight bearing restrictions ?OT Patient demonstrates impairments in the following area(s): Balance;Safety;Endurance;Motor;Pain ?OT Basic ADL's Functional Problem(s): Bathing;Dressing;Toileting ?OT Advanced ADL's Functional Problem(s): Simple Meal Preparation ?OT Transfers Functional Problem(s): Toilet;Tub/Shower ?OT Additional Impairment(s): None ?OT Plan ?OT Intensity: Minimum of 1-2 x/day, 45 to 90 minutes ?OT Frequency: 5 out of 7 days ?OT Duration/Estimated Length of Stay: 7-10 days ?OT  Treatment/Interventions: Balance/vestibular training;Discharge planning;Pain management;Self Care/advanced ADL retraining;Therapeutic Activities;UE/LE  Coordination activities;Disease mangement/prevention;Functional mobility training;Patient/family education;Therapeutic Exercise;DME/adaptive equipment instruction;UE/LE Strength taining/ROM;Wheelchair propulsion/positioning ?OT Self Feeding Anticipated Outcome(s): Indep ?OT Basic Self-Care Anticipated Outcome(s): Mod I to supervision ?OT Toileting Anticipated Outcome(s): mod I ?OT Bathroom Transfers Anticipated Outcome(s): Supervision ?OT Recommendation ?Patient destination: Home ?Follow Up Recommendations: Home health OT ?Equipment Recommended: 3 in 1 bedside comode;To be determined ? ? ?OT Evaluation ?Precautions/Restrictions  ?Precautions ?Precautions: Fall ?Restrictions ?Weight Bearing Restrictions: Yes ?RLE Weight Bearing: Non weight bearing ?Home Living/Prior Functioning ?Home Living ?Family/patient expects to be discharged to:: Private residence ?Living Arrangements: Spouse/significant other ?Available Help at Discharge: Family, Available 24 hours/day (wife is physically limited, but is retired Marine scientist. has daughter that is available to assist as needed) ?Type of Home: House ?Home Access: Stairs to enter ?Entrance Stairs-Number of Steps: 1-2 ?Entrance Stairs-Rails: None ?Home Layout: One level ?Bathroom Shower/Tub: Tub/shower unit, Walk-in shower, Door, Curtain (small lip to enter into walk in) ?Bathroom Toilet: Handicapped height ?Bathroom Accessibility: Yes ? Lives With: Spouse ?IADL History ?Homemaking Responsibilities: Yes ?Meal Prep Responsibility: Primary ?Current License: Yes ?Mode of Transportation: Car (had trouble getting in it to begin with since it's so low) ?Occupation: Retired ?Type of Occupation: retired Dealer, Agricultural consultant, as well as ministry ?Leisure and Hobbies: religious practices ?IADL Comments: was making meals himself, but wife available to complete at d/c ?Prior Function ?Level of Independence: Independent with basic ADLs, Independent with homemaking with ambulation, Independent  with transfers, Independent with gait ? Able to Take Stairs?: Yes ?Driving: Yes ?Vocation: Retired ?Vision ?Baseline Vision/History: 1 Wears glasses (readers) ?Ability to See in Adequate Light: 0 Adequate ?Patient Visual Report: No change from baseline ?Vision Assessment?: No apparent visual deficits ?Perception  ?Perception: Within Functional Limits ?Praxis ?Praxis: Intact ?Cognition ?Cognition ?Overall Cognitive Status: Within Functional Limits for tasks assessed ?Arousal/Alertness: Awake/alert ?Orientation Level: Person;Place;Situation ?Person: Oriented ?Place: Oriented ?Situation: Oriented ?Memory: Appears intact ?Awareness: Appears intact ?Problem Solving: Appears intact ?Safety/Judgment: Appears intact ?Brief Interview for Mental Status (BIMS) ?Repetition of Three Words (First Attempt): 3 ?Temporal Orientation: Year: Correct ?Temporal Orientation: Month: Accurate within 5 days ?Temporal Orientation: Day: Correct ?Recall: "Sock": Yes, no cue required ?Recall: "Blue": Yes, no cue required ?Recall: "Bed": Yes, no cue required ?BIMS Summary Score: 15 ?Sensation ?Sensation ?Light Touch: Appears Intact ?Hot/Cold: Appears Intact ?Proprioception: Appears Intact ?Stereognosis: Not tested ?Coordination ?Gross Motor Movements are Fluid and Coordinated: No ?Fine Motor Movements are Fluid and Coordinated: No ?Coordination and Movement Description: generalized deconditioning to weakness and WB precautions ?Finger Nose Finger Test: dysmetria, finger opposition limited due to bilateral arthritis but still discoordinated ?Motor  ?Motor ?Motor: Within Functional Limits  ?Trunk/Postural Assessment  ?Cervical Assessment ?Cervical Assessment: Within Functional Limits ?Thoracic Assessment ?Thoracic Assessment: Within Functional Limits ?Lumbar Assessment ?Lumbar Assessment: Within Functional Limits  ?Balance ?Balance ?Balance Assessed: Yes ?Static Standing Balance ?Static Standing - Balance Support: Bilateral upper extremity  supported ?Static Standing - Level of Assistance: 4: Min assist ?Extremity/Trunk Assessment ?RUE Assessment ?RUE Assessment: Within Functional Limits ?LUE Assessment ?LUE Assessment: Within Functional Limits ? ?Musician

## 2021-07-13 NOTE — Progress Notes (Signed)
Occupational Therapy Session Note ? ?Patient Details  ?Name: Edward Waller ?MRN: [id number] ?Date of Birth: 10-06-1932 ? ?Today's Date: 07/13/2021 ?OT Individual Time: 0071-2197 ?OT Individual Time Calculation (min): 30 min  ? ? ?Short Term Goals: ?Week 1:  OT Short Term Goal 1 (Week 1): STG = LTG due to ELOS ? ?Skilled Therapeutic Interventions/Progress Updates:  ?Pt resting in recliner upon arrival with wife present. OT intervention with focus on education and discharge planning. Educated pt and wife on role of OT and LTGs. Pt reports that he wants to be as independent as possible and put weight through his RLE. Explained that he would not be able to bear weight through RUE when he discharges from CIR. Pt remained in recliner with all needs within reach. Wife present.  ?Therapy Documentation ?Precautions:  ?Precautions ?Precautions: Fall ?Restrictions ?Weight Bearing Restrictions: Yes ?RLE Weight Bearing: Non weight bearing ?Pain: ?Pain Assessment ?Pain Scale: 0-10 ?Pain Score: 4  ?Pain Type: Acute pain ?Pain Location: Knee ?Pain Orientation: Right ?Pain Radiating Towards: leg ?Pain Descriptors / Indicators: Discomfort;Dull ?Pain Onset: On-going ?Pain Intervention(s): RN aware and meds admin prior to therapy ? ? ?Therapy/Group: Individual Therapy ? ?[name] ?07/13/2021, 2:41 PM ?

## 2021-07-13 NOTE — Progress Notes (Signed)
?                                                       PROGRESS NOTE ? ? ?Subjective/Complaints: ? ?LBM this AM- mushy- large- x2 in last 24 hours- wants ot reduce bowel meds.  ? ?Pt said not emptying bladder, however per chart, all bladder scans >150cc.  ?Pain OK overall, but back hurting- took 2 tylenol just now.  ? ?[place]kin gif wife can stay overnight.  ? ? ?ROS: ? ?Pt denies SOB, abd pain, CP, N/V/C/D, and vision changes ? ? ?Objective: ?  ?No results found. ?Recent Labs  ?  07/13/21 ?4259  ?WBC 8.4  ?HGB 12.5*  ?HCT 36.2*  ?PLT 214  ? ?Recent Labs  ?  07/13/21 ?5638  ?NA 138  ?K 4.7  ?CL 105  ?CO2 28  ?GLUCOSE 140*  ?BUN 17  ?CREATININE 0.83  ?CALCIUM 8.8*  ? ? ?Intake/Output Summary (Last 24 hours) at 07/13/2021 0837 ?Last data filed at 07/13/2021 0415 ?Gross per 24 hour  ?Intake 600 ml  ?Output 1000 ml  ?Net -400 ml  ?  ? ?  ? ?Physical Exam: ?Vital Signs ?Blood pressure (!) 146/85, pulse 83, temperature 98 ?F (36.7 ?C), resp. rate 16, height 6' (1.829 m), weight 78 kg, SpO2 96 %. ? ? ? ? ?General: awake, alert, appropriate, supine in bed; nurse at bedside; NAD ?HENT: conjugate gaze; oropharynx moist ?CV: regular rate; no JVD ?Pulmonary: CTA B/L; no W/R/R- good air movement ?GI: soft, NT, ND, (+)BS ?Psychiatric: appropriate- talkative; interactive ?Neurological: Ox3 ?Musculoskeletal:  ?   Cervical back: Neck supple. No tenderness.  ?   Comments: RLE with intact splint--NV intact. Incision on right knee with sutures in place, mild eythema and C/D/I. Right thigh with layering ecchymosis on inferior aspect. ?Cool toes, but can wiggle toes ?UE strength 5/5 B/L ?LLE 5/5 except DF which is 5-/5 ?RLE- HF 2/5; KE 2+/5; cannot test distally due to cast  ?Skin: ?   Comments: Bruising around L ankle; beginning clawing of L foot- peeling callus base of 1st L MTP ?Mild road rash L forearm ?R forearm- IV- looks OK ?R knee sutures 3 spots- look OK ?Mild RLE swelling above cast  ?Neurological:  ?   Mental Status: He is alert  and oriented to person, place, and time.  ?   Comments: Intact to light touch in all 4 extremities ?Assessment/Plan: ?1. Functional deficits which require 3+ hours per day of interdisciplinary therapy in a comprehensive inpatient rehab setting. ?Physiatrist is providing close team supervision and 24 hour management of active medical problems listed below. ?Physiatrist and rehab team continue to assess barriers to discharge/monitor patient progress toward functional and medical goals ? ?Care Tool: ? ?Bathing ?   ?   ?   ?  ?  ?Bathing assist   ?  ?  ?Upper Body Dressing/Undressing ?Upper body dressing   ?  ?   ?Upper body assist   ?   ?Lower Body Dressing/Undressing ?Lower body dressing ? ? ?   ?  ? ?  ? ?Lower body assist   ?   ? ?Toileting ?Toileting    ?Toileting assist Assist for toileting: Moderate Assistance - Patient 50 - 74% ?  ?  ?Transfers ?Chair/bed transfer ? ?Transfers assist ?   ? ?  ?  ?  ?  Locomotion ?Ambulation ? ? ?Ambulation assist ? ?   ? ?  ?  ?   ? ?Walk 10 feet activity ? ? ?Assist ?   ? ?  ?   ? ?Walk 50 feet activity ? ? ?Assist   ? ?  ?   ? ? ?Walk 150 feet activity ? ? ?Assist   ? ?  ?  ?  ? ?Walk 10 feet on uneven surface  ?activity ? ? ?Assist   ? ? ?  ?   ? ?Wheelchair ? ? ? ? ?Assist   ?  ?  ? ?  ?   ? ? ?Wheelchair 50 feet with 2 turns activity ? ? ? ?Assist ? ?  ?  ? ? ?   ? ?Wheelchair 150 feet activity  ? ? ? ?Assist ?   ? ? ?   ? ?Blood pressure (!) 146/85, pulse 83, temperature 98 ?F (36.7 ?C), resp. rate 16, height 6' (1.829 m), weight 78 kg, SpO2 96 %. ? ?Medical Problem List and Plan: ?1. Functional deficits secondary to fall/polytrauma with R tib/fib fx s/p IM nail- NWB and R orbit fx ?            -patient may  shower if cover RLE cast ?            -ELOS/Goals: 12-14 mod I to supervision ? First day of evaluations today- Con't CIR- PT and OT- NWB RLE ?2.  Antithrombotics: ?-DVT/anticoagulation:  Pharmaceutical: Lovenox X 21 days ?            -antiplatelet therapy:  N/A ?3. Pain  Management: Oxycodone prn. ?33/24- mainly taking tylenol  ?4. Mood: LCSW to follow for evaluation and support.  ?            -antipsychotic agents: N/A ?5. Neuropsych: This patient is capable of making decisions on his own behalf. ?6. Skin/Wound Care: Routine pressure relief measures.  ?7. Fluids/Electrolytes/Nutrition: Monitor I/O. Check CMET in am.  ?8. Right tib/fib Fx s/p ORIF: NWB X 6 weeks. Splint X 2 weeks followed by CAM walker ?9. Orbital floor Fx w/o entrapment:  Follow up with Dr. Ellie Lunch 2-3 weeks after discharge.  ?            --no blowing nose. ?10. BPH: Has nocturia X 5 at baseline. Was set for GU eval next week.  ?            --will check PVRs to monitor voiding/retention. ?            --discussed I/O caths for retention/limiting pm fluid intake, Flomax.  ?11. Mildly displaced sternal Fx: Asymptomatic--pathologic? ?--Will check PSA level in am. ?12. ABLA: Recheck CBC in am.  ?13. RLS: On requip at nights and would like am dose added ?            --was also taking Mg bid at home-->now daily-->check Mg level ?14. Constipation: On Miralax BID. Add senna to colace. ?            --Discussed increasing Mg back to bid if levels WNL. ?3/24- will change senokot s to 1 tab senna QHS and stop Miralax BID for now- esp since not taking pain meds as much.   ?15. HTN: Elevated this am--will monitor for trend before treating.  ? 16. Dispo ? 3/24- wife can stay overnight- wrote order- also so wife, retired [employer] scientist can help with transfers- but want her trained/signed off by therapy.  ? ? ?I spent  a total of 36   minutes on total care today- >50% coordination of care- due to d/w nursing and IPOC ? ? ?LOS: ?1 days ?A FACE TO FACE EVALUATION WAS PERFORMED ? ?Brandin Dilday ?07/13/2021, 8:37 AM  ? ? ? ?

## 2021-07-13 NOTE — Progress Notes (Signed)
Per daughter, pt wife will not be able to do transfers due to age and physically not being able to do for pt at this stage. ?

## 2021-07-14 DIAGNOSIS — T1490XA Injury, unspecified, initial encounter: Secondary | ICD-10-CM

## 2021-07-14 DIAGNOSIS — G2581 Restless legs syndrome: Secondary | ICD-10-CM

## 2021-07-14 DIAGNOSIS — D62 Acute posthemorrhagic anemia: Secondary | ICD-10-CM

## 2021-07-14 DIAGNOSIS — K5901 Slow transit constipation: Secondary | ICD-10-CM

## 2021-07-14 DIAGNOSIS — S82201S Unspecified fracture of shaft of right tibia, sequela: Secondary | ICD-10-CM

## 2021-07-14 LAB — URINALYSIS, COMPLETE (UACMP) WITH MICROSCOPIC
Bacteria, UA: NONE SEEN
Bilirubin Urine: NEGATIVE
Glucose, UA: NEGATIVE mg/dL
Hgb urine dipstick: NEGATIVE
Ketones, ur: NEGATIVE mg/dL
Leukocytes,Ua: NEGATIVE
Nitrite: NEGATIVE
Protein, ur: NEGATIVE mg/dL
Specific Gravity, Urine: 1.017 (ref 1.005–1.030)
pH: 5 (ref 5.0–8.0)

## 2021-07-14 MED ORDER — ROPINIROLE HCL 1 MG PO TABS
1.5000 mg | ORAL_TABLET | Freq: Every day | ORAL | Status: DC
  Administered 2021-07-14 – 2021-07-21 (×8): 1.5 mg via ORAL
  Filled 2021-07-14 (×8): qty 2

## 2021-07-14 NOTE — Progress Notes (Signed)
Occupational Therapy Session Note ? ?Patient Details  ?Name: Edward Waller ?MRN: [id number] ?Date of Birth: [birth year] ? ?Today's Date: 07/14/2021 ?OT Individual Time: 1100-1200 ?OT Individual Time Calculation (min): 60 min  ? ? ?Short Term Goals: ?Week 1:  OT Short Term Goal 1 (Week 1): STG = LTG due to ELOS ? ?Skilled Therapeutic Interventions/Progress Updates:  ?  Pt received in wc with spouse in the room. Discussed how we will hold off on having the wife do the transfers with him until he is at a consistent Supervision level.  ? ?Pt had just finished using the bathroom with NT but did not have brief on. Pt worked on sit to stand to [employer] with min A.  Pt able to pull pants down with CGA for balance, therapist applied briefs as pt held balance, pt then pulled pants up.   ? ?His new walker was adjusted to height appropriate for pt so his wife could go ahead and take that home.  ? ?Pt then self propelled to gym to work on UE strengthening with 4 lb hand wts then self propelled to other gym to get dycum to support leg on his elevating leg rest.  Pt given red theraband and instructed on how to do tricep exercises.  Recommended pt work on these over the weekend.  Pt returned to room. Opted to rest in wc for lunch. Wife in room with pt. ? ?Therapy Documentation ?Precautions:  ?Precautions ?Precautions: Fall ?Restrictions ?Weight Bearing Restrictions: Yes ?RLE Weight Bearing: Non weight bearing ? ?  ? ?  ?Pain: no c/o pain  ?  ? ? ? ?Therapy/Group: Individual Therapy ? ?[place] ?07/14/2021, 12:53 PM ?

## 2021-07-14 NOTE — Progress Notes (Addendum)
?                                                       PROGRESS NOTE ? ? ?Subjective/Complaints: ? ?Not sleeping well. Feels that his restless leg is keeping him up. Also asked about something to help with sleep. He and wife are concerned that wrap is loosening around RLE splint. Urinary urgency? ? ?ROS: Patient denies fever, rash, sore throat, blurred vision, dizziness, nausea, vomiting, diarrhea, cough, shortness of breath or chest pain, back/neck pain, headache, or mood change.  ? ? ?Objective: ?  ?No results found. ?Recent Labs  ?  07/13/21 ?1610  ?WBC 8.4  ?HGB 12.5*  ?HCT 36.2*  ?PLT 214  ? ?Recent Labs  ?  07/13/21 ?9604  ?NA 138  ?K 4.7  ?CL 105  ?CO2 28  ?GLUCOSE 140*  ?BUN 17  ?CREATININE 0.83  ?CALCIUM 8.8*  ? ? ?Intake/Output Summary (Last 24 hours) at 07/14/2021 1310 ?Last data filed at 07/14/2021 5409 ?Gross per 24 hour  ?Intake 1260 ml  ?Output 1100 ml  ?Net 160 ml  ?  ? ?  ? ?Physical Exam: ?Vital Signs ?Blood pressure 133/86, pulse 80, temperature 98.1 ?F (36.7 ?C), resp. rate 16, height 6' (1.829 m), weight 78 kg, SpO2 95 %. ? ? ? ? ?Constitutional: No distress . Vital signs reviewed. ?HEENT: NCAT, EOMI, oral membranes moist ?Neck: supple ?Cardiovascular: RRR without murmur. No JVD    ?Respiratory/Chest: CTA Bilaterally without wheezes or rales. Normal effort    ?GI/Abdomen: BS +, non-tender, non-distended ?Ext: no clubbing, cyanosis, tr 1+ edema around cast ?Psych: pleasant and cooperative, a little anxious ?Musculoskeletal:  ?   Cervical back: Neck supple. No tenderness.  ?   Comments: RLE with intact splint--NV intact. Incision on right knee with sutures in place, mild eythema and C/D/I. Right thigh with layering ecchymosis on inferior aspect.  ?Cool toes, but can wiggle toes ?UE strength 5/5 B/L ?LLE 5/5 except DF which is 5-/5 ?RLE- HF 2/5; KE 2+/5; cannot test distally due to cast  ?Skin: ?   Comments: bruising along right forefoot, some bruising as well LLE ?Mild road rash L forearm, scalp,  right eye. Areas healing nicely ?R knee wounds with suture ?  ?Neurological:  ?   Mental Status: He is alert and oriented to person, place, and time.  ?   Comments: Intact to light touch in all 4 extremities ? -moves left leg constantly while I was in room ? ? ?Assessment/Plan: ?1. Functional deficits which require 3+ hours per day of interdisciplinary therapy in a comprehensive inpatient rehab setting. ?Physiatrist is providing close team supervision and 24 hour management of active medical problems listed below. ?Physiatrist and rehab team continue to assess barriers to discharge/monitor patient progress toward functional and medical goals ? ?Care Tool: ? ?Bathing ? Bathing activity did not occur: Safety/medical concerns ?Body parts bathed by patient: Right arm, Left arm, Chest, Abdomen, Right upper leg, Left upper leg, Left lower leg, Face  ? Body parts bathed by helper: Buttocks, Front perineal area ?Body parts n/a: Left lower leg (cast) ?  ?Bathing assist Assist Level: Minimal Assistance - Patient > 75% ?  ?  ?Upper Body Dressing/Undressing ?Upper body dressing   ?What is the patient wearing?: Button up shirt ?   ?Upper body assist Assist  Level: Minimal Assistance - Patient > 75% ?   ?Lower Body Dressing/Undressing ?Lower body dressing ? ? ?   ?What is the patient wearing?: Pants ? ?  ? ?Lower body assist Assist for lower body dressing: Moderate Assistance - Patient 50 - 74% ?   ? ?Toileting ?Toileting    ?Toileting assist Assist for toileting: Moderate Assistance - Patient 50 - 74% ?  ?  ?Transfers ?Chair/bed transfer ? ?Transfers assist ? Chair/bed transfer activity did not occur: Safety/medical concerns ? ?Chair/bed transfer assist level: Contact Guard/Touching assist ?  ?  ?Locomotion ?Ambulation ? ? ?Ambulation assist ? ? Ambulation activity did not occur: Safety/medical concerns ? ?Assist level: Minimal Assistance - Patient > 75% ?Assistive device: Walker-rolling ?Max distance: 60  ? ?Walk 10 feet  activity ? ? ?Assist ?   ? ?Assist level: Minimal Assistance - Patient > 75% ?Assistive device: Walker-rolling  ? ?Walk 50 feet activity ? ? ?Assist   ? ?Assist level: Minimal Assistance - Patient > 75% ?Assistive device: Walker-rolling  ? ? ?Walk 150 feet activity ? ? ?Assist Walk 150 feet activity did not occur: Safety/medical concerns ? ?  ?  ?  ? ?Walk 10 feet on uneven surface  ?activity ? ? ?Assist Walk 10 feet on uneven surfaces activity did not occur: Safety/medical concerns ? ? ?  ?   ? ?Wheelchair ? ? ? ? ?Assist Is the patient using a wheelchair?: Yes ?Type of Wheelchair: Manual ?  ? ?Wheelchair assist level: Supervision/Verbal cueing ?   ? ? ?Wheelchair 50 feet with 2 turns activity ? ? ? ?Assist ? ?  ?  ? ? ?Assist Level: Supervision/Verbal cueing  ? ?Wheelchair 150 feet activity  ? ? ? ?Assist ?   ? ? ?Assist Level: Supervision/Verbal cueing  ? ?Blood pressure 133/86, pulse 80, temperature 98.1 ?F (36.7 ?C), resp. rate 16, height 6' (1.829 m), weight 78 kg, SpO2 95 %. ? ?Medical Problem List and Plan: ?1. Functional deficits secondary to fall/polytrauma with R tib/fib fx s/p IM nail- NWB and R orbit fx ?            -patient may  shower if cover RLE cast ?            -ELOS/Goals: 12-14 mod I to supervision ? -Continue CIR therapies including PT, OT  NWB RLE ?2.  Antithrombotics: ?-DVT/anticoagulation:  Pharmaceutical: Lovenox X 21 days ?            -antiplatelet therapy:  N/A ?3. Pain Management: Oxycodone prn. ?3/24- mainly taking tylenol  ?4. Mood/sleep:   ? -continue trazodone PRN only (see #13) ?            -antipsychotic agents: N/A ?5. Neuropsych: This patient is capable of making decisions on his own behalf. ?6. Skin/Wound Care: Routine pressure relief measures.  ?7. Fluids/Electrolytes/Nutrition: Monitor I/O. Check CMET in am.  ?8. Right tib/fib Fx s/p ORIF: NWB X 6 weeks. Splint X 2 weeks followed by CAM walker ?9. Orbital floor Fx w/o entrapment:  Follow up with Dr. Ellie Lunch 2-3 weeks after  discharge.  ?            --no blowing nose. ?10. BPH: Has nocturia X 5 at baseline. Was set for GU eval next week.  ?            --will check PVRs to monitor voiding/retention. ?            --discussed I/O caths for retention/limiting pm fluid intake,  Flomax.  ? 3/25 will check UA as pt expressed that he's having some urgency ?11. Mildly displaced sternal Fx: Asymptomatic--pathologic? ?--Will check PSA level in am. ?12. ABLA: f/u hgb 12.5 stable ?13. RLS: On requip at nights and would like am dose added ?            --was also taking Mg bid at home-->now daily-->Mg level normal ? 3/25 pt takes requip at night and mirapex during the day as it makes him less sleepy.  ?  -will increase requip to 1.'5mg'$  to see if it also helps his sleep. He is in agreement ?14. Constipation: On Miralax BID. Add senna to colace. ?            --Discussed increasing Mg back to bid if levels WNL. ?3/24- will change senokot s to 1 tab senna QHS and stop Miralax BID for now- esp since not taking pain meds as much.   ?3/25 ha moved his bowels the last 2 days ?15. HTN: Elevated this am--will monitor for trend before treating.  ? 16. Dispo ? 3/24- wife can stay overnight- wrote order- also so wife, retired [employer] scientist can help with transfers- but want her trained/signed off by therapy.  ? ? ? ? ? ?LOS: ?2 days ?A FACE TO FACE EVALUATION WAS PERFORMED ? ?Meredith Staggers ?07/14/2021, 1:10 PM  ? ? ? ?

## 2021-07-14 NOTE — Progress Notes (Signed)
Orthopedic Tech Progress Note ?Patient Details:  ?Donalda Ewings ?13-Feb-1933 ?[id number] ?Splint was rewrapped with new ace wrap  ?Patient ID: JAH ALARID, male   DOB: [birth year], 88 y.o.   MRN: [id number] ? ?Alezandra Egli E Emryn Flanery ?07/14/2021, 2:20 PM ? ?

## 2021-07-14 NOTE — Progress Notes (Signed)
Physical Therapy Session Note ? ?Patient Details  ?Name: Edward Waller ?MRN: [id number] ?Date of Birth: 1932/08/18 ? ?Today's Date: 07/14/2021 ?PT Individual Time: 0800-0900; 1300-1400 ?PT Individual Time Calculation (min): 60 min and 60 min ? ?Short Term Goals: ?Week 1:  PT Short Term Goal 1 (Week 1): STG =LTGs 2/2 to ELOS ? ?Skilled Therapeutic Interventions/Progress Updates:  ?  Session 1: ?Pt received seated in bed with wife present. Pt reports having a rough night due to feelings of claustrophobia from sleeping in hospital bed, restless leg syndrome, and frequent urination. Pt currently with condom catheter in place for use overnight, assisted pt with removing catheter. Pt with minimal complaints of pain this AM, is concerned about increase in bruising on his toes of R foot. MD in room to assess pt and reassures pt and his wife that increased bruising is normal. Encouraged pt to sleep in recliner vs hospital bed for increased comfort, pt agreeable. Seated in bed to sitting EOB at CGA level with HOB maximally elevated, use of bedrail, increased time for RLE management. Assisted pt with donning sock and shoe for L foot while seated EOB. Sit to stand with CGA to RW. Stand pivot transfer bed to recliner with RW and CGA for balance. Pt left seated in recliner in room with needs in reach, BLE elevated, wife present. Pt hyperverbal throughout session reiterating his medical history with this therapist. ? ?Session 2: ?Pt received seated in w/c in room, agreeable to PT session. Pt reports some pain in his RLE, not rated and declines intervention. Manual w/c propulsion up to 150 ft during session with use of BUE at Supervision level. Pt requires min A for management of w/c parts during session. Sit to stand with CGA to RW. Ambulation x 40 ft with RW and min A for balance while "hopping" on LLE. Pt reports increased difficulty with movement this date due to wearing tennis shoe. Encouraged pt to continue to wear tennis shoe  with mobility for improved grip and balance as well as protection of L foot. Also encouraged pt to fully clear LLE from the ground when hopping vs scooting foot along the floor as he had been doing when wearing a sock. Ascend/descend one 1" step with progression to one 4" step in // bars with min A for balance (ascend backwards, descend forwards) to initiate stair training. Pt able to perform x 10 reps at each height with cues to fully clear step. Discussed pt's home setup and provided home measurement sheet to his wife. Pt's wife to complete home measurement sheet and photograph areas of the home pt will need to navigate including steps. Pt returned to recliner at end of session, needs in reach. Pt left in care of ortho tech for RLE dressing change. ? ?Therapy Documentation ?Precautions:  ?Precautions ?Precautions: Fall ?Restrictions ?Weight Bearing Restrictions: Yes ?RLE Weight Bearing: Non weight bearing ? ? ? ? ? ?Therapy/Group: Individual Therapy ? ? ?Excell Seltzer, PT, DPT, CSRS ?07/14/2021, 12:04 PM  ?

## 2021-07-15 NOTE — Progress Notes (Signed)
?                                                       PROGRESS NOTE ? ? ?Subjective/Complaints: ? ?Had a much better night. The increased requip helped him sleep and also slowed down his restless leg sx. Feels alert this am. Also is happy that [employer] re-wrapped his splint. Low back can be a little sore if he's in bed too long ? ?ROS: Patient denies fever, rash, sore throat, blurred vision, dizziness, nausea, vomiting, diarrhea, cough, shortness of breath or chest pain,   headache, or mood change.  ? ? ?Objective: ?  ?No results found. ?Recent Labs  ?  07/13/21 ?0165  ?WBC 8.4  ?HGB 12.5*  ?HCT 36.2*  ?PLT 214  ? ?Recent Labs  ?  07/13/21 ?5374  ?NA 138  ?K 4.7  ?CL 105  ?CO2 28  ?GLUCOSE 140*  ?BUN 17  ?CREATININE 0.83  ?CALCIUM 8.8*  ? ? ?Intake/Output Summary (Last 24 hours) at 07/15/2021 1105 ?Last data filed at 07/15/2021 0700 ?Gross per 24 hour  ?Intake 118 ml  ?Output 500 ml  ?Net -382 ml  ?  ? ?  ? ?Physical Exam: ?Vital Signs ?Blood pressure 139/76, pulse 75, temperature 98.3 ?F (36.8 ?C), resp. rate 14, height 6' (1.829 m), weight 78 kg, SpO2 96 %. ? ? ? ? ?Constitutional: No distress . Vital signs reviewed. ?HEENT: NCAT, EOMI, oral membranes moist ?Neck: supple ?Cardiovascular: RRR without murmur. No JVD    ?Respiratory/Chest: CTA Bilaterally without wheezes or rales. Normal effort    ?GI/Abdomen: BS +, non-tender, non-distended ?Ext: no clubbing, cyanosis, or edema ?Psych: very pleasant and cooperative  ?Musculoskeletal:  ?   Cervical back: Neck supple. No tenderness.  ?   Comments: RLE with intact splint--NV intact. Incision on right knee with sutures in place, mild eythema and C/D/I. Right thigh with layering ecchymosis on inferior aspect. RLE splint re-wrapped and appears firmly in place. ?Toes warm.  ?UE strength 5/5 B/L ?LLE 5/5 except DF which is 5-/5 ?RLE- HF 2/5; KE 2+/5; cannot test distally due to cast  ?Skin: ?   Comments: bruising along right forefoot, some bruising as well LLE ?Mild road  rash L forearm, scalp, right eye. Areas healing nicely ?R knee wounds with suture in place.  ?  ?Neurological:  ?   Mental Status: He is alert and oriented to person, place, and time.  ?   Comments: Intact to light touch in all 4 extremities ? Not moving LLE as much today while I was in room ? ? ?Assessment/Plan: ?1. Functional deficits which require 3+ hours per day of interdisciplinary therapy in a comprehensive inpatient rehab setting. ?Physiatrist is providing close team supervision and 24 hour management of active medical problems listed below. ?Physiatrist and rehab team continue to assess barriers to discharge/monitor patient progress toward functional and medical goals ? ?Care Tool: ? ?Bathing ? Bathing activity did not occur: Safety/medical concerns ?Body parts bathed by patient: Right arm, Left arm, Chest, Abdomen, Right upper leg, Left upper leg, Left lower leg, Face  ? Body parts bathed by helper: Buttocks, Front perineal area ?Body parts n/a: Left lower leg (cast) ?  ?Bathing assist Assist Level: Minimal Assistance - Patient > 75% ?  ?  ?Upper Body Dressing/Undressing ?Upper body dressing Upper body  dressing/undressing activity did not occur (including orthotics): Safety/medical concerns ?What is the patient wearing?: Button up shirt ?   ?Upper body assist Assist Level: Minimal Assistance - Patient > 75% ?   ?Lower Body Dressing/Undressing ?Lower body dressing ? ? ? Lower body dressing activity did not occur: Safety/medical concerns ?What is the patient wearing?: Pants ? ?  ? ?Lower body assist Assist for lower body dressing: Moderate Assistance - Patient 50 - 74% ?   ? ?Toileting ?Toileting    ?Toileting assist Assist for toileting: Moderate Assistance - Patient 50 - 74% ?  ?  ?Transfers ?Chair/bed transfer ? ?Transfers assist ? Chair/bed transfer activity did not occur: Safety/medical concerns ? ?Chair/bed transfer assist level: Minimal Assistance - Patient > 75% ?  ?   ?Locomotion ?Ambulation ? ? ?Ambulation assist ? ? Ambulation activity did not occur: Safety/medical concerns ? ?Assist level: Contact Guard/Touching assist ?Assistive device: Walker-rolling ?Max distance: 79'  ? ?Walk 10 feet activity ? ? ?Assist ?   ? ?Assist level: Contact Guard/Touching assist ?Assistive device: Walker-rolling  ? ?Walk 50 feet activity ? ? ?Assist   ? ?Assist level: Minimal Assistance - Patient > 75% ?Assistive device: Walker-rolling  ? ? ?Walk 150 feet activity ? ? ?Assist Walk 150 feet activity did not occur: Safety/medical concerns ? ?  ?  ?  ? ?Walk 10 feet on uneven surface  ?activity ? ? ?Assist Walk 10 feet on uneven surfaces activity did not occur: Safety/medical concerns ? ? ?  ?   ? ?Wheelchair ? ? ? ? ?Assist Is the patient using a wheelchair?: Yes ?Type of Wheelchair: Manual ?  ? ?Wheelchair assist level: Supervision/Verbal cueing ?Max wheelchair distance: 150'  ? ? ?Wheelchair 50 feet with 2 turns activity ? ? ? ?Assist ? ?  ?  ? ? ?Assist Level: Supervision/Verbal cueing  ? ?Wheelchair 150 feet activity  ? ? ? ?Assist ?   ? ? ?Assist Level: Supervision/Verbal cueing  ? ?Blood pressure 139/76, pulse 75, temperature 98.3 ?F (36.8 ?C), resp. rate 14, height 6' (1.829 m), weight 78 kg, SpO2 96 %. ? ?Medical Problem List and Plan: ?1. Functional deficits secondary to fall/polytrauma with R tib/fib fx s/p IM nail- NWB and R orbit fx ?            -patient may  shower if cover RLE cast ?            -ELOS/Goals: 12-14 mod I to supervision ? -Continue CIR therapies including PT, OT. NWB RLE ?2.  Antithrombotics: ?-DVT/anticoagulation:  Pharmaceutical: Lovenox X 21 days ?            -antiplatelet therapy:  N/A ?3. Pain Management: Oxycodone prn. ?3/25- mainly taking tylenol  ?4. Mood/sleep:   ? -continue trazodone PRN only (see #13) ?            -antipsychotic agents: N/A ?5. Neuropsych: This patient is capable of making decisions on his own behalf. ?6. Skin/Wound Care: Routine pressure  relief measures.  ?7. Fluids/Electrolytes/Nutrition: Monitor I/O. Check CMET in am.  ?8. Right tib/fib Fx s/p ORIF: NWB X 6 weeks. Splint X 2 weeks followed by CAM walker ?9. Orbital floor Fx w/o entrapment:  Follow up with Dr. Ellie Lunch 2-3 weeks after discharge.  ?            --no blowing nose. ?10. BPH: Has nocturia X 5 at baseline. Was set for GU eval next week.  ?            --  will check PVRs to monitor voiding/retention. ?            --discussed I/O caths for retention/limiting pm fluid intake, Flomax.  ? 3/26 UA negative ?  -encouraged OOB to void, double voids ?11. Mildly displaced sternal Fx: Asymptomatic--pathologic? ?--  PSA on 3/24 was 6.48--f/u per primary team ?12. ABLA: f/u hgb 12.5 stable ?13. RLS: On requip at nights and would like am dose added ?            --was also taking Mg bid at home-->now daily-->Mg level normal ? - pt takes requip at night and mirapex during the day as it makes him less sleepy.  ? -3/26 did better last night with requip 1.'5mg'$  -->continue for sleep/RLS ?14. Constipation: On Miralax BID. Add senna to colace. ?            --Discussed increasing Mg back to bid if levels WNL. ?3/24- will change senokot s to 1 tab senna QHS and stop Miralax BID for now- esp since not taking pain meds as much.   ?3/25 moving bowels daily ?15. HTN: Elevated this am--will monitor for trend before treating.  ? 16. Dispo ? 3/24- wife can stay overnight- wrote order- also so wife, retired Marine scientist can help with transfers- but want her trained/signed off by therapy.  ? ? ? ? ? ?LOS: ?3 days ?A FACE TO FACE EVALUATION WAS PERFORMED ? ?Meredith Staggers ?07/15/2021, 11:05 AM  ? ? ? ?

## 2021-07-15 NOTE — Progress Notes (Signed)
Physical Therapy Session Note ? ?Patient Details  ?Name: Edward Waller ?MRN: [id number] ?Date of Birth: 01/14/1933 ? ?Today's Date: 07/15/2021 ?PT Individual Time: 0900-1000 ?PT Individual Time Calculation (min): 60 min  ? ?Short Term Goals: ?Week 1:  PT Short Term Goal 1 (Week 1): STG =LTGs 2/2 to ELOS ? ?Skilled Therapeutic Interventions/Progress Updates:  ?  Pt received seated in bed, agreeable to PT session. Pt reports he slept much better last night due to some medication changes, denies sleeping in recliner. Pt reports flare-up of some back pain during session, requests pain medication from nursing at end of session. Assisted pt with doffing condom catheter. Seated in bed to sitting EOB with min A needed for RLE management. Sit to stand with CGA to RW. Assisted pt with doffing brief and boxers. While in sitting pt is max A to don Depends and pants. Assisted pt with pulling up pants over hips in standing. Stand pivot transfer to w/c with RW and CGA. Manual w/c propulsion 2 x 150 ft with use of BUE at Supervision level, assist needed for management of w/c parts. Sit to stand with CGA in // bars. Ascend/descend one 4" step with progression to one 6" step with min A for balance, x 5-10 reps each to fatigue (ascend backwards, descend forwards). Pt does well with progression to 6" step and will have to navigate 6" step upon d/c home. Pt's wife able to provide photos of home entry points to better problem solve how pt can safely enter his home upon d/c. Pt left seated in recliner in room with needs in reach at end of session. ? ?Therapy Documentation ?Precautions:  ?Precautions ?Precautions: Fall ?Restrictions ?Weight Bearing Restrictions: Yes ?RLE Weight Bearing: Non weight bearing ? ? ? ? ? ? ?Therapy/Group: Individual Therapy ? ? ?Excell Seltzer, PT, DPT, CSRS ?07/15/2021, 10:42 AM  ?

## 2021-07-16 LAB — CBC
HCT: 36.7 % — ABNORMAL LOW (ref 39.0–52.0)
Hemoglobin: 12.5 g/dL — ABNORMAL LOW (ref 13.0–17.0)
MCH: 33.3 pg (ref 26.0–34.0)
MCHC: 34.1 g/dL (ref 30.0–36.0)
MCV: 97.9 fL (ref 80.0–100.0)
Platelets: 291 10*3/uL (ref 150–400)
RBC: 3.75 MIL/uL — ABNORMAL LOW (ref 4.22–5.81)
RDW: 12.7 % (ref 11.5–15.5)
WBC: 8.4 10*3/uL (ref 4.0–10.5)
nRBC: 0 % (ref 0.0–0.2)

## 2021-07-16 LAB — BASIC METABOLIC PANEL
Anion gap: 6 (ref 5–15)
BUN: 14 mg/dL (ref 8–23)
CO2: 28 mmol/L (ref 22–32)
Calcium: 8.8 mg/dL — ABNORMAL LOW (ref 8.9–10.3)
Chloride: 102 mmol/L (ref 98–111)
Creatinine, Ser: 0.83 mg/dL (ref 0.61–1.24)
GFR, Estimated: >60 mL/min (ref >60)
Glucose, Bld: 133 mg/dL — ABNORMAL HIGH (ref 70–99)
Potassium: 4.2 mmol/L (ref 3.5–5.1)
Sodium: 136 mmol/L (ref 135–145)

## 2021-07-16 NOTE — Progress Notes (Signed)
Patient ID: [name], male   DOB: 1932-06-07, 88 y.o.   MRN: [id number] ? ?SW received updates from PT pt needs ELRs vs swing away due to injury. SW spoke with Jermaine/[employer] to discuss. Will bring ELRs tomorrow.  ? ?SW spoke with [name] to provide updates on ELOS. Family edu scheduled for Wednesday 9am-12pm.She is aware SW will follow-up after team conference tomorrow.   ? ?Loralee Pacas, MSW, LCSWA ?Office: [phone] ?Cell: [phone] ?Fax: [phone]  ?

## 2021-07-16 NOTE — Progress Notes (Signed)
Occupational Therapy Session Note ? ?Patient Details  ?Name: HASSEN BRUUN ?MRN: [id number] ?Date of Birth: [birth year] ? ?Today's Date: 07/16/2021 ?OT Individual Time: 6803-2122 ?OT Individual Time Calculation (min): 45 min  ? ? ?Short Term Goals: ?Week 1:  OT Short Term Goal 1 (Week 1): STG = LTG due to ELOS ? ?Skilled Therapeutic Interventions/Progress Updates:  ?  Pt resting in bed upon arrival. Pt requested to use BSC. Supine>sit EOB with supervision. Stand pivot transfer with min A and mod verbal cues for sequencing and safety. Max A for toileting. Max A for LB dressing tasks. Stand pivot tranfser to w/c with min A. Pt remained in w/c with all needs within reach and seat alarm activated.  ? ?Therapy Documentation ?Precautions:  ?Precautions ?Precautions: Fall ?Restrictions ?Weight Bearing Restrictions: Yes ?RLE Weight Bearing: Non weight bearing ? ?Pain: ? Pt reports Rt hip pain 4/10; repositioned ? ? ?Therapy/Group: Individual Therapy ? ?[name] ?07/16/2021, 12:28 PM ?

## 2021-07-16 NOTE — Care Management (Signed)
Inpatient Rehabilitation Center ?Individual Statement of Services ? ?Patient Name:  Edward Waller  ?Date:  07/16/2021 ? ?Welcome to the [facility].  Our goal is to provide you with an individualized program based on your diagnosis and situation, designed to meet your specific needs.  With this comprehensive rehabilitation program, you will be expected to participate in at least 3 hours of rehabilitation therapies Monday-Friday, with modified therapy programming on the weekends. ? ?Your rehabilitation program will include the following services:  Physical Therapy (PT), Occupational Therapy (OT), 24 hour per day rehabilitation nursing, Therapeutic Recreaction (TR), Psychology, Neuropsychology, Care Coordinator, Rehabilitation Medicine, Nutrition Services, Pharmacy Services, and Other ? ?Weekly team conferences will be held on Tuesdays to discuss your progress.  Your Inpatient Rehabilitation Care Coordinator will talk with you frequently to get your input and to update you on team discussions.  Team conferences with you and your family in attendance may also be held. ? ?Expected length of stay: 7-10 days  ? ?Overall anticipated outcome: Supervision ? ?Depending on your progress and recovery, your program may change. Your Inpatient Rehabilitation Care Coordinator will coordinate services and will keep you informed of any changes. Your Inpatient Rehabilitation Care Coordinator's name and contact numbers are listed  below. ? ?The following services may also be recommended but are not provided by the [facility]:  ?Driving Evaluations ?Home Health Rehabiltiation Services ?Outpatient Rehabilitation Services ?Vocational Rehabilitation ?  ?Arrangements will be made to provide these services after discharge if needed.  Arrangements include referral to agencies that provide these services. ? ?Your insurance has been verified to be:  Human resources officer ? ?Your primary doctor is:   Andy Gauss ? ?Pertinent information will be shared with your doctor and your insurance company. ? ?Inpatient Rehabilitation Care Coordinator:  Cathleen Corti [phone] or (C) [phone] ? ?Information discussed with and copy given to patient by: [name], 07/16/2021, 10:02 AM    ?

## 2021-07-16 NOTE — Progress Notes (Signed)
Patient appeared to sleep last night, but patient reports he kept waking up dreaming. PRN trazodone 25 mg's given at 2114. Compression/ splint wrap in place to RLE. + CMS. Edward Waller A  ?

## 2021-07-16 NOTE — Progress Notes (Signed)
?                                                       PROGRESS NOTE ? ? ?Subjective/Complaints: ? ?Pt reports RLS Sx's better last night.  ?LBM overnight. Took trazodone- helped sleep as well.  ?RLE more itching than pain, but back hurts- requires tylenol for back.  ?Had weird dreams last night, told nursing, but not physician.  ? ? ?ROS:  ?Pt denies SOB, abd pain, CP, N/V/C/D, and vision changes ? ? ? ?Objective: ?  ?No results found. ?Recent Labs  ?  07/16/21 ?0658  ?WBC 8.4  ?HGB 12.5*  ?HCT 36.7*  ?PLT 291  ? ?Recent Labs  ?  07/16/21 ?0658  ?NA 136  ?K 4.2  ?CL 102  ?CO2 28  ?GLUCOSE 133*  ?BUN 14  ?CREATININE 0.83  ?CALCIUM 8.8*  ? ? ?Intake/Output Summary (Last 24 hours) at 07/16/2021 1859 ?Last data filed at 07/16/2021 1836 ?Gross per 24 hour  ?Intake 840 ml  ?Output 1050 ml  ?Net -210 ml  ?  ? ?  ? ?Physical Exam: ?Vital Signs ?Blood pressure (!) 117/99, pulse 78, temperature (!) 97.5 ?F (36.4 ?C), temperature source Oral, resp. rate 16, height 6' (1.829 m), weight 78 kg, SpO2 92 %. ? ? ? ? ? ?General: awake, alert, appropriate, sitting up in w/c in room; NAD ?HENT: conjugate gaze; oropharynx moist- healing scabs on forehead and R eye ?CV: regular rate; no JVD ?Pulmonary: CTA B/L; no W/R/R- good air movement ?GI: soft, NT, ND, (+)BS ?Psychiatric: appropriate- talkative;  ?Neurological: Ox3 ? ?Musculoskeletal:  ?   Cervical back: Neck supple. No tenderness.  ?   Comments: RLE with intact splint--NV intact. Incision on right knee with sutures in place, mild eythema and C/D/I. Right thigh with layering ecchymosis on inferior aspect. RLE splint re-wrapped and appears firmly in place. stable ?Toes warm.  ?UE strength 5/5 B/L ?LLE 5/5 except DF which is 5-/5 ?RLE- HF 2/5; KE 2+/5; cannot test distally due to cast  ?Skin: ?   Comments: bruising along right forefoot, some bruising as well LLE ?Mild road rash L forearm, scalp, right eye. Areas healing nicely ?R knee wounds with suture in place.  ?  ?Neurological:   ?   Mental Status: He is alert and oriented to person, place, and time.  ?   Comments: Intact to light touch in all 4 extremities ? Not moving LLE as much today while I was in room ? ? ?Assessment/Plan: ?1. Functional deficits which require 3+ hours per day of interdisciplinary therapy in a comprehensive inpatient rehab setting. ?Physiatrist is providing close team supervision and 24 hour management of active medical problems listed below. ?Physiatrist and rehab team continue to assess barriers to discharge/monitor patient progress toward functional and medical goals ? ?Care Tool: ? ?Bathing ? Bathing activity did not occur: Safety/medical concerns ?Body parts bathed by patient: Right arm, Left arm, Chest, Abdomen, Right upper leg, Left upper leg, Left lower leg, Face  ? Body parts bathed by helper: Buttocks, Front perineal area ?Body parts n/a: Left lower leg (cast) ?  ?Bathing assist Assist Level: Minimal Assistance - Patient > 75% ?  ?  ?Upper Body Dressing/Undressing ?Upper body dressing Upper body dressing/undressing activity did not occur (including orthotics): Safety/medical concerns ?What is the patient wearing?:  Button up shirt ?   ?Upper body assist Assist Level: Minimal Assistance - Patient > 75% ?   ?Lower Body Dressing/Undressing ?Lower body dressing ? ? ? Lower body dressing activity did not occur: Safety/medical concerns ?What is the patient wearing?: Pants ? ?  ? ?Lower body assist Assist for lower body dressing: Moderate Assistance - Patient 50 - 74% ?   ? ?Toileting ?Toileting    ?Toileting assist Assist for toileting: Moderate Assistance - Patient 50 - 74% ?  ?  ?Transfers ?Chair/bed transfer ? ?Transfers assist ? Chair/bed transfer activity did not occur: Safety/medical concerns ? ?Chair/bed transfer assist level: Contact Guard/Touching assist ?  ?  ?Locomotion ?Ambulation ? ? ?Ambulation assist ? ? Ambulation activity did not occur: Safety/medical concerns ? ?Assist level: Contact  Guard/Touching assist ?Assistive device: Walker-rolling ?Max distance: 62'  ? ?Walk 10 feet activity ? ? ?Assist ?   ? ?Assist level: Contact Guard/Touching assist ?Assistive device: Walker-rolling  ? ?Walk 50 feet activity ? ? ?Assist   ? ?Assist level: Contact Guard/Touching assist ?Assistive device: Walker-rolling  ? ? ?Walk 150 feet activity ? ? ?Assist Walk 150 feet activity did not occur: Safety/medical concerns ? ?  ?  ?  ? ?Walk 10 feet on uneven surface  ?activity ? ? ?Assist Walk 10 feet on uneven surfaces activity did not occur: Safety/medical concerns ? ? ?  ?   ? ?Wheelchair ? ? ? ? ?Assist Is the patient using a wheelchair?: Yes ?Type of Wheelchair: Manual ?  ? ?Wheelchair assist level: Supervision/Verbal cueing ?Max wheelchair distance: 150'  ? ? ?Wheelchair 50 feet with 2 turns activity ? ? ? ?Assist ? ?  ?  ? ? ?Assist Level: Supervision/Verbal cueing  ? ?Wheelchair 150 feet activity  ? ? ? ?Assist ?   ? ? ?Assist Level: Supervision/Verbal cueing  ? ?Blood pressure (!) 117/99, pulse 78, temperature (!) 97.5 ?F (36.4 ?C), temperature source Oral, resp. rate 16, height 6' (1.829 m), weight 78 kg, SpO2 92 %. ? ?Medical Problem List and Plan: ?1. Functional deficits secondary to fall/polytrauma with R tib/fib fx s/p IM nail- NWB and R orbit fx ?            -patient may  shower if cover RLE cast ?            -ELOS/Goals: 12-14 mod I to supervision ? Con't CIR- PT and OT- NWB on RLE ?2.  Antithrombotics: ?-DVT/anticoagulation:  Pharmaceutical: Lovenox X 21 days ?            -antiplatelet therapy:  N/A ?3. Pain Management: Oxycodone prn. ?3/25- mainly taking tylenol  ?3/27- pain controlled with tylenol- con't regimen ?4. Mood/sleep:   ? -continue trazodone PRN only (see #13) ?            -antipsychotic agents: N/A ?5. Neuropsych: This patient is capable of making decisions on his own behalf. ?6. Skin/Wound Care: Routine pressure relief measures.  ?7. Fluids/Electrolytes/Nutrition: Monitor I/O. Check CMET  in am.  ?8. Right tib/fib Fx s/p ORIF: NWB X 6 weeks. Splint X 2 weeks followed by CAM walker ?9. Orbital floor Fx w/o entrapment:  Follow up with Dr. Ellie Lunch 2-3 weeks after discharge.  ?            --no blowing nose. ?10. BPH: Has nocturia X 5 at baseline. Was set for GU eval next week.  ?            --will check PVRs to monitor  voiding/retention. ?            --discussed I/O caths for retention/limiting pm fluid intake, Flomax.  ? 3/26 UA negative ?  -encouraged OOB to void, double voids ?11. Mildly displaced sternal Fx: Asymptomatic--pathologic? ?--  PSA on 3/24 was 6.48--f/u per primary team ?12. ABLA: f/u hgb 12.5 stable ?13. RLS: On requip at nights and would like am dose added ?            --was also taking Mg bid at home-->now daily-->Mg level normal ? - pt takes requip at night and mirapex during the day as it makes him less sleepy.  ? -3/26 did better last night with requip 1.'5mg'$  -->continue for sleep/RLS ? 3/27- sleeping OK with trazodone and Requip at night ?14. Constipation: On Miralax BID. Add senna to colace. ?            --Discussed increasing Mg back to bid if levels WNL. ?3/24- will change senokot s to 1 tab senna QHS and stop Miralax BID for now- esp since not taking pain meds as much.   ?3/27 - having BM's daily-  ?15. HTN: Elevated this am--will monitor for trend before treating.  ? 16. Dispo ? 3/24- wife can stay overnight- wrote order- also so wife, retired [employer] scientist can help with transfers- but want her trained/signed off by therapy.  ? ? ? ? ? ?LOS: ?4 days ?A FACE TO FACE EVALUATION WAS PERFORMED ? ?Tore Carreker ?07/16/2021, 6:59 PM  ? ? ? ?

## 2021-07-16 NOTE — Progress Notes (Signed)
Physical Therapy Session Note ? ?Patient Details  ?Name: DASANI CREAR ?MRN: [id number] ?Date of Birth: Aug 07, 1932 ? ?Today's Date: 07/16/2021 ?PT Individual Time: 8250-0370 ?PT Individual Time Calculation (min): 70 min  ? ?Short Term Goals: ?Week 1:  PT Short Term Goal 1 (Week 1): STG =LTGs 2/2 to ELOS ? ?Skilled Therapeutic Interventions/Progress Updates:  ?  Pt received seated in w/c in room, agreeable to PT session. No complaints of pain during session. Manual w/c propulsion x 150 ft with use of BUE at Supervision level. Pt requires assist for management of w/c parts. Sit to stand with CGA in // bars and with RW during session. Ascend/descend 4" step with progression to 6" step in // bars with min A for balance (ascend backwards/descend forwards) x 10 reps each. Progression to ascend/descend one 6" step backwards with RW and min A for balance x 5 reps. Pt does have fatigue of BUE as well as LLE with this task but continues to progress with safe stair navigation. Seated RLE knee flexion from elevated mat x 10 reps, LAQ x 10 reps, marches x 15 reps. Ambulation x 70 ft with RW and CGA for balance via hopping on LLE. Pt requests to remain seated in recliner at end of session, needs in reach. ? ?Therapy Documentation ?Precautions:  ?Precautions ?Precautions: Fall ?Restrictions ?Weight Bearing Restrictions: Yes ?RLE Weight Bearing: Non weight bearing ? ? ? ? ? ?Therapy/Group: Individual Therapy ? ? ?Excell Seltzer, PT, DPT, CSRS ?07/16/2021, 5:08 PM  ?

## 2021-07-16 NOTE — Progress Notes (Signed)
Physical Therapy Session Note ? ?Patient Details  ?Name: Edward Waller ?MRN: [id number] ?Date of Birth: [birth year] ? ?Today's Date: 07/16/2021 ?PT Group Time: 1000-1100 ?PT Group Time Calculation (min): 60 min ? ?Short Term Goals: ?Week 1:  PT Short Term Goal 1 (Week 1): STG =LTGs 2/2 to ELOS ? ?Skilled Therapeutic Interventions/Progress Updates:  ?  Pt received seated in w/c in dayroom for group therapy session. Session focus on therapeutic activities including seated ball toss with other group members while engaging in cognitive task of naming a type of food for each letter of the alphabet. Pt also engages in UB strengthening with use of 5# dowel rod performing bicep curls, chest press, and alt L/R canoeing x 10-15 reps each. Pt returned to room and left seated in w/c with needs in reach at end of session. ? ?Therapy Documentation ?Precautions:  ?Precautions ?Precautions: Fall ?Restrictions ?Weight Bearing Restrictions: Yes ?RLE Weight Bearing: Non weight bearing ? ? ? ? ? ? ?Therapy/Group: Group Therapy ? ? ?Excell Seltzer, PT, DPT, CSRS ?07/16/2021, 5:21 PM  ?

## 2021-07-17 MED ORDER — LIDOCAINE 5 % EX PTCH
2.0000 | MEDICATED_PATCH | CUTANEOUS | Status: DC
  Administered 2021-07-17 – 2021-07-23 (×7): 2 via TRANSDERMAL
  Filled 2021-07-17 (×7): qty 2

## 2021-07-17 NOTE — Progress Notes (Signed)
Physical Therapy Session Note ? ?Patient Details  ?Name: Edward Waller ?MRN: [id number] ?Date of Birth: 04-28-1932 ? ?Today's Date: 07/17/2021 ?PT Individual Time: 7654-6503 ?PT Individual Time Calculation (min): 45 min  ? ?Short Term Goals: ?Week 1:  PT Short Term Goal 1 (Week 1): STG =LTGs 2/2 to ELOS ? ?Skilled Therapeutic Interventions/Progress Updates:  ?Pt was received lying in bed with HOB elevated. Pt reports 3/10 pain, nursing offered Tylenol earlier but pt denied. Recumbent > sitting EOB to RW performed with CGA and increased time to complete. Pt performed STS from EOB > RW with CGA for safety. Ambulated approx 56f to get to bathroom with RW CGA. CGA-minA for safety when toileting and donning briefs/pants with use of RW and grab bar. Pt is dependent for peri-care d/t balance deficit. ? ?Pt was transported to therapy gym in w/c for time management and performed stand-pivot transfer from W/C > standing in front of 6inch wooden step with RW CGA. Pt navigated one 6-inch step backwards with RW for 6 reps CGA-minA with visual cuing for foot placement on the ground and to facilitate proprioception. Stand-pivot transfer with RW  ?> sitting in W/C CGA for safety. Pt left seated in W/C in therapy gym with OT to prepare for his following group therapy session. ? ?Therapy Documentation ?Precautions:  ?Precautions ?Precautions: Fall ?Restrictions ?Weight Bearing Restrictions: Yes ?RLE Weight Bearing: Non weight bearing ? ?Therapy/Group: Individual Therapy ? ?RLanetta Inch?07/17/2021, 4:24 PM  ?

## 2021-07-17 NOTE — Progress Notes (Signed)
?                                                       PROGRESS NOTE ? ? ?Subjective/Complaints: ? ?Didn't sleep as well due to back pain- 8-9/10  ?10 days since surgery.  ?RLE more itching and sensitive than painful- ?LBM last night- on bedpan- said they didn't get him to [facility].  ? ? ? ?ROS:  ? ?Pt denies SOB, abd pain, CP, N/V/C/D, and vision changes ? ? ? ?Objective: ?  ?No results found. ?Recent Labs  ?  07/16/21 ?0658  ?WBC 8.4  ?HGB 12.5*  ?HCT 36.7*  ?PLT 291  ? ?Recent Labs  ?  07/16/21 ?0658  ?NA 136  ?K 4.2  ?CL 102  ?CO2 28  ?GLUCOSE 133*  ?BUN 14  ?CREATININE 0.83  ?CALCIUM 8.8*  ? ? ?Intake/Output Summary (Last 24 hours) at 07/17/2021 0846 ?Last data filed at 07/17/2021 0600 ?Gross per 24 hour  ?Intake 600 ml  ?Output 750 ml  ?Net -150 ml  ?  ? ?  ? ?Physical Exam: ?Vital Signs ?Blood pressure 138/66, pulse 81, temperature 98.2 ?F (36.8 ?C), temperature source Oral, resp. rate 18, height 6' (1.829 m), weight 78 kg, SpO2 95 %. ? ? ? ? ? ? ?General: awake, alert, appropriate, sitting on bed in apartment with OTA;  NAD ?HENT: conjugate gaze; oropharynx moist- scabs and bruises almost gone from R eye and scalp ?CV: regular rate; no JVD ?Pulmonary: CTA B/L; no W/R/R- good air movement ?GI: soft, NT, ND, (+)BS ?Psychiatric: appropriate- talkative ?Neurological: Ox3 ? ? ?Musculoskeletal:  ?   Cervical back: Neck supple. No tenderness.  ?   Comments: RLE with intact splint--NV intact. Incision on right knee with sutures in place, mild eythema and C/D/I. Right thigh with layering ecchymosis on inferior aspect. RLE splint re-wrapped and appears firmly in place. stable ?Toes warm.  ?UE strength 5/5 B/L ?LLE 5/5 except DF which is 5-/5 ?RLE- HF 2/5; KE 2+/5; cannot test distally due to cast  ?Skin: ?   Comments: bruising along right forefoot, some bruising as well LLE ?Mild road rash L forearm, almost healed and ascalp and R eye look great- sutures look good on R knee/distal thigh ?  ?Neurological:  ?   Mental  Status: He is alert and oriented to person, place, and time.  ?   Comments: Intact to light touch in all 4 extremities ? Not moving LLE as much today while I was in room ? ? ?Assessment/Plan: ?1. Functional deficits which require 3+ hours per day of interdisciplinary therapy in a comprehensive inpatient rehab setting. ?Physiatrist is providing close team supervision and 24 hour management of active medical problems listed below. ?Physiatrist and rehab team continue to assess barriers to discharge/monitor patient progress toward functional and medical goals ? ?Care Tool: ? ?Bathing ? Bathing activity did not occur: Safety/medical concerns ?Body parts bathed by patient: Right arm, Left arm, Chest, Abdomen, Right upper leg, Left upper leg, Left lower leg, Face  ? Body parts bathed by helper: Buttocks, Front perineal area ?Body parts n/a: Left lower leg (cast) ?  ?Bathing assist Assist Level: Minimal Assistance - Patient > 75% ?  ?  ?Upper Body Dressing/Undressing ?Upper body dressing Upper body dressing/undressing activity did not occur (including orthotics): Safety/medical concerns ?What is the  patient wearing?: Button up shirt ?   ?Upper body assist Assist Level: Minimal Assistance - Patient > 75% ?   ?Lower Body Dressing/Undressing ?Lower body dressing ? ? ? Lower body dressing activity did not occur: Safety/medical concerns ?What is the patient wearing?: Pants ? ?  ? ?Lower body assist Assist for lower body dressing: Moderate Assistance - Patient 50 - 74% ?   ? ?Toileting ?Toileting    ?Toileting assist Assist for toileting: Moderate Assistance - Patient 50 - 74% ?  ?  ?Transfers ?Chair/bed transfer ? ?Transfers assist ? Chair/bed transfer activity did not occur: Safety/medical concerns ? ?Chair/bed transfer assist level: Contact Guard/Touching assist ?  ?  ?Locomotion ?Ambulation ? ? ?Ambulation assist ? ? Ambulation activity did not occur: Safety/medical concerns ? ?Assist level: Contact Guard/Touching  assist ?Assistive device: Walker-rolling ?Max distance: 10'  ? ?Walk 10 feet activity ? ? ?Assist ?   ? ?Assist level: Contact Guard/Touching assist ?Assistive device: Walker-rolling  ? ?Walk 50 feet activity ? ? ?Assist   ? ?Assist level: Contact Guard/Touching assist ?Assistive device: Walker-rolling  ? ? ?Walk 150 feet activity ? ? ?Assist Walk 150 feet activity did not occur: Safety/medical concerns ? ?  ?  ?  ? ?Walk 10 feet on uneven surface  ?activity ? ? ?Assist Walk 10 feet on uneven surfaces activity did not occur: Safety/medical concerns ? ? ?  ?   ? ?Wheelchair ? ? ? ? ?Assist Is the patient using a wheelchair?: Yes ?Type of Wheelchair: Manual ?  ? ?Wheelchair assist level: Supervision/Verbal cueing ?Max wheelchair distance: 150'  ? ? ?Wheelchair 50 feet with 2 turns activity ? ? ? ?Assist ? ?  ?  ? ? ?Assist Level: Supervision/Verbal cueing  ? ?Wheelchair 150 feet activity  ? ? ? ?Assist ?   ? ? ?Assist Level: Supervision/Verbal cueing  ? ?Blood pressure 138/66, pulse 81, temperature 98.2 ?F (36.8 ?C), temperature source Oral, resp. rate 18, height 6' (1.829 m), weight 78 kg, SpO2 95 %. ? ?Medical Problem List and Plan: ?1. Functional deficits secondary to fall/polytrauma with R tib/fib fx s/p IM nail- NWB and R orbit fx ?            -patient may  shower if cover RLE cast ?            -ELOS/Goals: 12-14 mod I to supervision ? Con't CIR_ PT, and OT- NWB RLE- team conference today to determine length of stay ?2.  Antithrombotics: ?-DVT/anticoagulation:  Pharmaceutical: Lovenox X 21 days ?            -antiplatelet therapy:  N/A ?3. Pain Management: Oxycodone prn. ?3/28- doesn't want opiates- will start Lidoderm patches 2- of them on back 8pm to 8am ?4. Mood/sleep:   ? -continue trazodone PRN only (see #13) ?            -antipsychotic agents: N/A ?5. Neuropsych: This patient is capable of making decisions on his own behalf. ?6. Skin/Wound Care: Routine pressure relief measures.  ?7.  Fluids/Electrolytes/Nutrition: Monitor I/O. Check CMET in am.  ?8. Right tib/fib Fx s/p ORIF: NWB X 6 weeks. Splint X 2 weeks followed by CAM walker ?9. Orbital floor Fx w/o entrapment:  Follow up with Dr. Ellie Lunch 2-3 weeks after discharge.  ?            --no blowing nose. ?10. BPH: Has nocturia X 5 at baseline. Was set for GU eval next week.  ?            --  will check PVRs to monitor voiding/retention. ?            --discussed I/O caths for retention/limiting pm fluid intake, Flomax.  ? 3/26 UA negative ?  -encouraged OOB to void, double voids ?11. Mildly displaced sternal Fx: Asymptomatic--pathologic? ?--  PSA on 3/24 was 6.48--f/u per primary team ?12. ABLA: f/u hgb 12.5 stable ?13. RLS: On requip at nights and would like am dose added ?            --was also taking Mg bid at home-->now daily-->Mg level normal ? - pt takes requip at night and mirapex during the day as it makes him less sleepy.  ? -3/26 did better last night with requip 1.'5mg'$  -->continue for sleep/RLS ? 3/27- sleeping OK with trazodone and Requip at night ?14. Constipation: On Miralax BID. Add senna to colace. ?            --Discussed increasing Mg back to bid if levels WNL. ?3/24- will change senokot s to 1 tab senna QHS and stop Miralax BID for now- esp since not taking pain meds as much.   ?3/28- having daily BM's- sugges to nursing to do on BSC/toilet.  ?15. HTN: Elevated this am--will monitor for trend before treating.  ? 16. Dispo ? 3/28- daughter doesn't want wife helping, but can offer emotional support ? ? ?I spent a total of  39  minutes on total care today- >50% coordination of care- due to d/w pt about back pain options that aren't narcotics and team conference ? ? ? ? ?LOS: ?5 days ?A FACE TO FACE EVALUATION WAS PERFORMED ? ?Timi Reeser ?07/17/2021, 8:46 AM  ? ? ? ?

## 2021-07-17 NOTE — Progress Notes (Signed)
Assisted to bathroom before getting into bed. Slept well last night into this morning. Documentation on sleep chart .Wife stayed overnight. RLE elevated on block/pillows to help relieve swelling. Normal sensation and circulation in RLE. Denies pain nor SOB. Call bell within reach of patient. Safety maintained. ?

## 2021-07-17 NOTE — Patient Care Conference (Signed)
Inpatient RehabilitationTeam Conference and Plan of Care Update ?Date: 07/17/2021   Time: 11:09 AM  ? ? ?Patient Name: Edward Waller      ?Medical Record Number: [id number]  ?Date of Birth: [birth year] ?Sex: Male         ?Room/Bed: 4W20C/4W20C-01 ?Payor Info: Payor: [employer] / Plan: [place] / Product Type: *No Product type* /   ? ?Admit Date/Time:  07/12/2021 11:48 AM ? ?Primary Diagnosis:  Trauma ? ?Hospital Problems: Principal Problem: ?  Trauma ?Active Problems: ?  Right tibial and fibular fracture ?  Sternal fracture ?  Orbital fracture ([place]) ? ? ? ?Expected Discharge Date: Expected Discharge Date: 07/23/21 ? ?Team Members Present: ?Physician leading conference: Dr. Courtney Heys ?Social Worker Present: Loralee Pacas, LCSWA ?Nurse Present: Dorthula Nettles, RN ?PT Present: Excell Seltzer, PT ?OT Present: Roanna Epley, Claris Gladden, OT ?PPS Coordinator present : Ileana Ladd, PT ? ?   Current Status/Progress Goal Weekly Team Focus  ?Bowel/Bladder ? ? Continent/incontinent of B/B  Regain/Maintain continence.  Assist with Frequent/timed toileting regimen.   ?Swallow/Nutrition/ Hydration ? ?           ?ADL's ? ? UB bathing/dressing-min A: LB dressing-max A; functional transfers-min A: toileting-max A  mod I/supervision overall  BADL training, functional transfers, safety awareness, educaiton   ?Mobility ? ? Supervision bed mobility, CGA transfers, CGA gait x 70 ft RW, Supervision w/c mobility, min A one 6" step backwards with RW  Supervision to mod I  gait training, stair navigation, transfers, balance, RLE strengthening and ROM   ?Communication ? ?           ?Safety/Cognition/ Behavioral Observations ?           ?Pain ? ? Denies pain but benefits from scheduled tylenol.  Pain<3/10.  Assess q shift and prn   ?Skin ? ? Multiple abraisions with right LLE  surgical incision( NWB)  Promote wound healing and prevent further breakdown.  Assess Qshift and prn   ? ? ?Discharge  Planning:  ?D/c to home with his wife who can provide supervision only. SW submitted aide referral to [place].   ?Team Discussion: ?Chronic back pain, will only take Tylenol. Lidocaine patches added. Continent B/B, daily BM. Discontinued Miralax, decreased Senna. Wife to be here tomorrow for family education. [facility]. Sutures CDI. ? ?Patient on target to meet rehab goals: ?yes, supervision to mod I goals. Currently CGA overall, supervision WC. Min assist stairs with RW. CGA with ADL's. ? ?*See Care Plan and progress notes for long and short-term goals.  ? ?Revisions to Treatment Plan:  ?Adjusting medications ?  ?Teaching Needs: ?Family education, medication/pain management, skin/wound care, transfer/gait training, etc. ?  ?Current Barriers to Discharge: ?Decreased caregiver support, Home enviroment access/layout, Wound care, and Weight bearing restrictions ? ?Possible Resolutions to Barriers: ?Family education ?Order recommended DME ?Follow-up HH/Outpatient therapy ?  ? ? Medical Summary ?Current Status: back pain 8-9/10 and doesn't take tylenol at night- continent B/B- LBM overnight- sutures look good on R knee- in cast ? Barriers to Discharge: Weight bearing restrictions;Wound care;Decreased family/caregiver support;Home enviroment access/layout;Medical stability ? Barriers to Discharge Comments: to go home with wife- kids work- will need to go home supervision ?Possible Resolutions to [employer] Focus: CGA overall 70 ft RW- doing stairs- supervision to mod I goals- main limiters is back pain- add lidoderm patches at night-  d/c- 4/3 ? ? ?Continued Need for Acute Rehabilitation Level of Care: The patient requires daily medical  management by a physician with specialized training in physical medicine and rehabilitation for the following reasons: ?Direction of a multidisciplinary physical rehabilitation program to maximize functional independence : Yes ?Medical management of patient stability for increased  activity during participation in an intensive rehabilitation regime.: Yes ?Analysis of laboratory values and/or radiology reports with any subsequent need for medication adjustment and/or medical intervention. : Yes ? ? ?I attest that I was present, lead the team conference, and concur with the assessment and plan of the team. ? ? ?Dorthula Nettles G ?07/17/2021, 4:30 PM  ? ? ? ? ? ? ?

## 2021-07-17 NOTE — Progress Notes (Signed)
Occupational Therapy Session Note ? ?Patient Details  ?Name: Edward Waller ?MRN: [id number] ?Date of Birth: 04/19/1933 ? ?Today's Date: 07/17/2021 ?OT Group Time: 4259-5638 ?OT Group Time Calculation (min): 60 min ? ? ?Short Term Goals: ?Week 1:  OT Short Term Goal 1 (Week 1): STG = LTG due to ELOS ? ?Skilled Therapeutic Interventions/Progress Updates:  ?Pt participated in group session with a focus on w/c mgmt and BUE strength and endurance to facilitate improved activity tolerance and strength for higher level BADLs and functional mobility tasks.  ?Session started with therapeutic activity of engaging in seated corn hole game to promote improved attention to w/c mgmt and BUE coordination. Pt able to complete w/c mobility with supervision, good carryover to remember to lock brakes. Pt utilized RUE to toss bean bags. ?Pt also engaged in seated therapeutic activity game where pts were instructed to roll large dice,  once a number was determined each number correlated to an UB exercise and a number of reps, exercises included bicep curls, tricep extensions, upright rows, flys, chest presses and punches. Repetitions ranged from 10-20. ?Pt chose to use 3 lb weights during session, pt utilizing BUEs during therex. Eduction provided during activity of various modifications for all exercises.  ?Ended session with guided deep breathing for 1 min. Discussed benefits of deep breathing to manage stress and pain. Pt self propelled back to room with supervision from NT.  ? ?Therapy Documentation ?Precautions:  ?Precautions ?Precautions: Fall ?Restrictions ?Weight Bearing Restrictions: Yes ?RLE Weight Bearing: Non weight bearing ? ?Pain: 5/10 low back pain , rest breaks provided as needed.  ? ? ? ?Therapy/Group: Group Therapy ? ?Precious Haws ?07/17/2021, 4:13 PM ?

## 2021-07-17 NOTE — Progress Notes (Signed)
Patient ID: Edward Waller, male   DOB: 1933/04/14, 88 y.o.   MRN: [id number] ? ?SW met with pt and pt wife in room to provide updates from team conference, d/c date remains 4/3, and confirm family edu tomorrow. Confirms family edu. SW reiterated waiting on ELRs.  ? ?Loralee Pacas, MSW, LCSWA ?Office: [phone] ?Cell: [phone] ?Fax: [phone]  ?

## 2021-07-17 NOTE — Progress Notes (Signed)
Occupational Therapy Session Note ? ?Patient Details  ?Name: Edward Waller ?MRN: [id number] ?Date of Birth: [birth year] ? ?Today's Date: 07/17/2021 ?OT Individual Time: 1856-3149 ?OT Individual Time Calculation (min): 74 min  ? ? ?Short Term Goals: ?Week 1:  OT Short Term Goal 1 (Week 1): STG = LTG due to ELOS ? ?Skilled Therapeutic Interventions/Progress Updates:  ?  Pt resting in bed upon arrival with wife present. Pt declined shower this morning. OT intervention with focus on bed mobility, sit<>stand from EOB, standing balance, functional transfers, discharge planning, activity tolerance, and safety awareness to increase independence with BADLs. Supine>sit EOB with supervision. Sit<>stand from EOB with CGA and min A to pull pants over hips. Stand pivot transfer with RW to w/c with CGA. Pt practiced bed transfers in ADL apartment with CGA for stand pivot and hopping to EOB. Pt completed all bed mobility with supervision. Pt has walk-in shower and tub shower. Demonstrated use of TTB vs walk in shower. Pt does not have grab bars at home. Pt voiced preference for TTB in tub shower. Home Measurement sheet provided for pt's wife previously and requested that the form be completed with attention to steps and door widths to enable team to better make recommendations. Pt remained in w/c with wife present. All needs within reach. ? ?Therapy Documentation ?Precautions:  ?Precautions ?Precautions: Fall ?Restrictions ?Weight Bearing Restrictions: Yes ?RLE Weight Bearing: Non weight bearing ?Pain: ? Pt reports 8/10 pain in back and Rt hip with activity; MD aware and repositioned ? ? ?Therapy/Group: Individual Therapy ? ?[name] ?07/17/2021, 8:17 AM ?

## 2021-07-18 MED ORDER — TRAZODONE HCL 50 MG PO TABS
50.0000 mg | ORAL_TABLET | Freq: Every day | ORAL | Status: DC
  Administered 2021-07-18: 50 mg via ORAL
  Filled 2021-07-18: qty 1

## 2021-07-18 MED ORDER — POLYVINYL ALCOHOL 1.4 % OP SOLN
1.0000 [drp] | OPHTHALMIC | Status: DC | PRN
  Filled 2021-07-18: qty 15

## 2021-07-18 NOTE — Progress Notes (Signed)
?                                                       PROGRESS NOTE ? ? ?Subjective/Complaints: ? ?RLE itching and sensitive- also some pain when wiggles toes on dorsum of R foot.  ?"Best night's sleep since got here." Lidoderm patches helpful- didn't resolved pain, but helpful- wants to keep using them.  ?Slept after took trazodone- will schedule it.  ? ? ?ROS:  ? ?Pt denies SOB, abd pain, CP, N/V/C/D, and vision changes ? ? ?Objective: ?  ?No results found. ?Recent Labs  ?  07/16/21 ?0658  ?WBC 8.4  ?HGB 12.5*  ?HCT 36.7*  ?PLT 291  ? ?Recent Labs  ?  07/16/21 ?0658  ?NA 136  ?K 4.2  ?CL 102  ?CO2 28  ?GLUCOSE 133*  ?BUN 14  ?CREATININE 0.83  ?CALCIUM 8.8*  ? ? ?Intake/Output Summary (Last 24 hours) at 07/18/2021 0848 ?Last data filed at 07/18/2021 0534 ?Gross per 24 hour  ?Intake 240 ml  ?Output 850 ml  ?Net -610 ml  ?  ? ?  ? ?Physical Exam: ?Vital Signs ?Blood pressure 139/73, pulse 80, temperature 98.2 ?F (36.8 ?C), temperature source Oral, resp. rate 18, height 6' (1.829 m), weight 78 kg, SpO2 100 %. ? ? ? ? ? ? ? ?General: awake, alert, appropriate, transferring into w/c- wife and OTA in room; using RW; NAD ?HENT: conjugate gaze; oropharynx moist- R eye draining a little- looks great, bruising wise; scabs almost gone on scalp and R eyelid ?CV: regular rate; no JVD ?Pulmonary: CTA B/L; no W/R/R- good air movement ?GI: soft, NT, ND, (+)BS ?Psychiatric: appropriate ?Neurological: Ox3 ? ? ?Musculoskeletal: still has some palpable swelling on dorsum of R foot- wiggling toes ?   Cervical back: Neck supple. No tenderness.  ?   Comments: RLE with intact splint--NV intact. Incision on right knee with sutures in place, mild eythema and C/D/I. Right thigh with layering ecchymosis on inferior aspect. RLE splint re-wrapped and appears firmly in place. stable ?Toes warm.  ?UE strength 5/5 B/L ?LLE 5/5 except DF which is 5-/5 ?RLE- HF 2/5; KE 2+/5; cannot test distally due to cast  ?Skin: ?   Comments: bruising along  right forefoot, some bruising as well LLE ?Mild road rash L forearm, almost healed and ascalp and R eye look great- sutures look good on R knee/distal thigh ?  ?Neurological:  ?   Mental Status: He is alert and oriented to person, place, and time.  ?   Comments: Intact to light touch in all 4 extremities ? Not moving LLE as much today while I was in room ? ? ?Assessment/Plan: ?1. Functional deficits which require 3+ hours per day of interdisciplinary therapy in a comprehensive inpatient rehab setting. ?Physiatrist is providing close team supervision and 24 hour management of active medical problems listed below. ?Physiatrist and rehab team continue to assess barriers to discharge/monitor patient progress toward functional and medical goals ? ?Care Tool: ? ?Bathing ? Bathing activity did not occur: Safety/medical concerns ?Body parts bathed by patient: Right arm, Left arm, Chest, Abdomen, Right upper leg, Left upper leg, Left lower leg, Face  ? Body parts bathed by helper: Buttocks, Front perineal area ?Body parts n/a: Left lower leg (cast) ?  ?Bathing assist Assist Level: Minimal Assistance -  Patient > 75% ?  ?  ?Upper Body Dressing/Undressing ?Upper body dressing Upper body dressing/undressing activity did not occur (including orthotics): Safety/medical concerns ?What is the patient wearing?: Button up shirt ?   ?Upper body assist Assist Level: Minimal Assistance - Patient > 75% ?   ?Lower Body Dressing/Undressing ?Lower body dressing ? ? ? Lower body dressing activity did not occur: Safety/medical concerns ?What is the patient wearing?: Pants ? ?  ? ?Lower body assist Assist for lower body dressing: Moderate Assistance - Patient 50 - 74% ?   ? ?Toileting ?Toileting    ?Toileting assist Assist for toileting: Moderate Assistance - Patient 50 - 74% ?  ?  ?Transfers ?Chair/bed transfer ? ?Transfers assist ? Chair/bed transfer activity did not occur: Safety/medical concerns ? ?Chair/bed transfer assist level: Contact  Guard/Touching assist ?  ?  ?Locomotion ?Ambulation ? ? ?Ambulation assist ? ? Ambulation activity did not occur: Safety/medical concerns ? ?Assist level: Contact Guard/Touching assist ?Assistive device: Walker-rolling ?Max distance: 31f  ? ?Walk 10 feet activity ? ? ?Assist ?   ? ?Assist level: Contact Guard/Touching assist ?Assistive device: Walker-rolling  ? ?Walk 50 feet activity ? ? ?Assist   ? ?Assist level: Contact Guard/Touching assist ?Assistive device: Walker-rolling  ? ? ?Walk 150 feet activity ? ? ?Assist Walk 150 feet activity did not occur: Safety/medical concerns ? ?  ?  ?  ? ?Walk 10 feet on uneven surface  ?activity ? ? ?Assist Walk 10 feet on uneven surfaces activity did not occur: Safety/medical concerns ? ? ?  ?   ? ?Wheelchair ? ? ? ? ?Assist Is the patient using a wheelchair?: Yes ?Type of Wheelchair: Manual ?  ? ?Wheelchair assist level: Supervision/Verbal cueing ?Max wheelchair distance: 150'  ? ? ?Wheelchair 50 feet with 2 turns activity ? ? ? ?Assist ? ?  ?  ? ? ?Assist Level: Supervision/Verbal cueing  ? ?Wheelchair 150 feet activity  ? ? ? ?Assist ?   ? ? ?Assist Level: Supervision/Verbal cueing  ? ?Blood pressure 139/73, pulse 80, temperature 98.2 ?F (36.8 ?C), temperature source Oral, resp. rate 18, height 6' (1.829 m), weight 78 kg, SpO2 100 %. ? ?Medical Problem List and Plan: ?1. Functional deficits secondary to fall/polytrauma with R tib/fib fx s/p IM nail- NWB and R orbit fx ?            -patient may  shower if cover RLE cast ?            -ELOS/Goals: 12-14 mod I to supervision ? D/c date 07/23/21 ? Continue CIR- PT, OT - home with wife ?2.  Antithrombotics: ?-DVT/anticoagulation:  Pharmaceutical: Lovenox X 21 days ?            -antiplatelet therapy:  N/A ?3. Pain Management: Oxycodone prn. ?3/28- doesn't want opiates- will start Lidoderm patches 2- of them on back 8pm to 8a ? 3/29- pain doing better- con't regimen ?4. Mood/sleep:   ? -continue trazodone PRN only (see #13) ?             -antipsychotic agents: N/A ?5. Neuropsych: This patient is capable of making decisions on his own behalf. ?6. Skin/Wound Care: Routine pressure relief measures.  ?7. Fluids/Electrolytes/Nutrition: Monitor I/O. Check CMET in am.  ?8. Right tib/fib Fx s/p ORIF: NWB X 6 weeks. Splint X 2 weeks followed by CAM walker ? 3/29- will call Ortho Friday since will be 2 weeks ?9. Orbital floor Fx w/o entrapment:  Follow up with Dr. MEllie Lunch  2-3 weeks after discharge.  ?            --no blowing nose. ?10. BPH: Has nocturia X 5 at baseline. Was set for GU eval next week.  ?            --will check PVRs to monitor voiding/retention. ?            --discussed I/O caths for retention/limiting pm fluid intake, Flomax.  ? 3/26 UA negative ?  -encouraged OOB to void, double voids ?11. Mildly displaced sternal Fx: Asymptomatic--pathologic? ?--  PSA on 3/24 was 6.48--f/u per primary team ?12. ABLA: f/u hgb 12.5 stable ?13. RLS: On requip at nights and would like am dose added ?            --was also taking Mg bid at home-->now daily-->Mg level normal ? - pt takes requip at night and mirapex during the day as it makes him less sleepy.  ? -3/26 did better last night with requip 1.'5mg'$  -->continue for sleep/RLS ? 3/27- sleeping OK with trazodone and Requip at night ? 3/29- will make trazodone scheduled 50 mg QHS ?14. Constipation: On Miralax BID. Add senna to colace. ?            --Discussed increasing Mg back to bid if levels WNL. ?3/24- will change senokot s to 1 tab senna QHS and stop Miralax BID for now- esp since not taking pain meds as much.   ?3/28- having daily BM's- sugges to nursing to do on BSC/toilet.  ?15. HTN: Elevated this am--will monitor for trend before treating.  ? 3/29- BP controlled- con't regimen ? 16. Dispo ? 3/28- daughter doesn't want wife helping, but can offer emotional support ? 3/29- pt going home with wife- supervision level. Will also order rewetting drops for R eye crusting.  ? ? ? ? ? ? ?LOS: ?6 days ?A FACE  TO FACE EVALUATION WAS PERFORMED ? ?Edward Waller ?07/18/2021, 8:48 AM  ? ? ? ?

## 2021-07-18 NOTE — Progress Notes (Signed)
Occupational Therapy Session Note ? ?Patient Details  ?Name: Edward Waller ?MRN: [id number] ?Date of Birth: 03-02-1933 ? ?Today's Date: 07/18/2021 ?OT Individual Time: 5465-0354 ?OT Individual Time Calculation (min): 60 min  ? ? ?Short Term Goals: ?Week 1:  OT Short Term Goal 1 (Week 1): STG = LTG due to ELOS ? ?Skilled Therapeutic Interventions/Progress Updates:  ?  Pt resting in bed upon arrival. Supine>sit EOB using bed rails with supervision. Sit<>stand from EOB with CGA and assistance to straighten pants. Pt transferred to w/c with CGA. Pt practiced TTB transfers and toilet tranfsers with CGA. Pt practiced all transfers X 3 during session. Pt propelled w/c room<>ADL apartment with supervision. Pt remained in w/c with all needs within reach and wife present.  ? ?Therapy Documentation ?Precautions:  ?Precautions ?Precautions: Fall ?Restrictions ?Weight Bearing Restrictions: Yes ?RLE Weight Bearing: Non weight bearing ? ?  ?Pain: ?Pain Assessment ?Pain Scale: 0-10 ?Pain Score: 2  ?Pain Type: Chronic pain ?Pain Location: Back ?Pain Orientation: Lower ?Pain Descriptors / Indicators: Aching ?Pain Onset: On-going ?Pain Intervention(s): RN aware ? ? ?Therapy/Group: Individual Therapy ? ?[name] ?07/18/2021, 9:18 AM ?

## 2021-07-18 NOTE — Progress Notes (Signed)
Physical Therapy Session Note ? ?Patient Details  ?Name: Edward Waller ?MRN: [id number] ?Date of Birth: [birth year] ? ?Today's Date: 07/18/2021 ?PT Individual Time: 8101-7510 ?PT Individual Time Calculation (min): 25 min  ? ?Short Term Goals: ?Week 1:  PT Short Term Goal 1 (Week 1): STG =LTGs 2/2 to ELOS ? ?Skilled Therapeutic Interventions/Progress Updates:  ? Received pt sitting in WC, pt agreeable to PT treatment, and reported pain in low back (unrated). Session with emphasis on functional mobility/transfers, generalized strengthening, and problem solving stair navigation. Pt requested to review technique for stair navigation reporting he "still can't visualize" what therapist was saying from this morning. Pt's wife reports issue is that there is not enough room on step for RW to fit prior to going up next step, but pt contradicting wife reporting that there is. Transported to/from room in [facility] dependently and demonstrated technique for navigating 5in curb with RW (ascending backwards and descending forwards) per pt request. However, pt still not understanding wife's point that RW won't fit on step landing - returned to room for wife to show therapist pictures of steps. Therapist suggested temporary ramp for safety, to decrease caregiver burden, and for energy conservation and pt/wife reported their grandsons may be able to put one in - wife very open to idea of ramp stating "I hadn't thought about that"! Pt requested to return to bed and transferred WC<>bed stand<>pivot with RW and CGA with good adherence to RLE NWB precautions; sit<>supine with supervision. Concluded session with pt semi-reclined in bed, needs within reach, and bed alarm on.  ? ?Therapy Documentation ?Precautions:  ?Precautions ?Precautions: Fall ?Restrictions ?Weight Bearing Restrictions: Yes ?RLE Weight Bearing: Non weight bearing ? ?Therapy/Group: Individual Therapy ?Blenda Nicely ?Becky Sax PT, DPT  ?07/18/2021, 7:38 AM  ?

## 2021-07-18 NOTE — Progress Notes (Signed)
Patient ID: NUR KRASINSKI, male   DOB: April 27, 1932, 88 y.o.   MRN: [id number] ? ?SW spoke with Jermaine/[employer] to discuss ELRs and items should be here by Friday.  ? ?SW updated Jennifer/[facility] on d/c date and needs HHPT/OT/aide.  ? ?Loralee Pacas, MSW, LCSWA ?Office: [phone] ?Cell: [phone] ?Fax: [phone]  ?

## 2021-07-18 NOTE — Progress Notes (Signed)
Physical Therapy Session Note ? ?Patient Details  ?Name: Edward Waller ?MRN: [id number] ?Date of Birth: Jun 07, 1932 ? ?Today's Date: 07/18/2021 ?PT Individual Time: 1020-1120 ?PT Individual Time Calculation (min): 60 min  ? ?Short Term Goals: ?Week 1:  PT Short Term Goal 1 (Week 1): STG =LTGs 2/2 to ELOS ? ?Skilled Therapeutic Interventions/Progress Updates: Pt presented in w/c agreeable to therapy. Pt states "a little" pain 3/10. Pt noted to be wearing shoes with wife explaining that she had husband try several pairs of shoes and verbalizes the importance of trying shoes prior to d/c. Pt perseverating on the fact that he's wearing his "Sunday" shoes vs regular shoes. Session focused on hands on family education in preparation for d/c. Pt propelled to day room and demonstrated ambulation with RW ~32f with CGA for transfers and ambulation. Pt and wife state that they have been doing ambulatory transfer to bathroom and feels comfortable with guarding for ambulation. Pt then transported to ortho gym for time management and performed car transfer at SUV level. Pt did require minA for RLE management and explained the importance of knee ROM to facilitate car entry. Pt somewhat hesitant but agreeable to work on it during remaining stay. Pt then propelled to rehab gym and set up 5in step to practice hopping onto step. Pt was able to perform with CGA however required assistance for RW and pt had brief episode of instability but was able to maintain precautions and no LOB. Pt was able to descend onto level tile safely with CGA. Pt then propelled towards room and was handed off to OT for following session.  ? ?Therapy Documentation ?Precautions:  ?Precautions ?Precautions: Fall ?Restrictions ?Weight Bearing Restrictions: Yes ?RLE Weight Bearing: Non weight bearing ?General: ?  ?Vital Signs: ?Therapy Vitals ?Temp: 97.6 ?F (36.4 ?C) ?Temp Source: Oral ?Pulse Rate: 84 ?Resp: 20 ?BP: (!) 142/71 ?Patient Position (if appropriate):  Sitting ?Oxygen Therapy ?SpO2: 98 % ?O2 Device: Room Air ?Pain: ?Pain Assessment ?Pain Scale: 0-10 ?Pain Score: 7  ?Pain Type: Chronic pain ?Pain Location: Back ?Pain Orientation: Lower ?Pain Descriptors / Indicators: Aching ?Pain Frequency: Intermittent ?Pain Onset: With Activity ?Pain Intervention(s): Medication (See eMAR) ?Mobility: ?  ?Locomotion : ?   ?Trunk/Postural Assessment : ?   ?Balance: ?  ?Exercises: ?  ?Other Treatments:   ? ? ? ?Therapy/Group: Individual Therapy ? ?Joshawn Crissman ?07/18/2021, 4:15 PM  ?

## 2021-07-18 NOTE — Progress Notes (Signed)
Occupational Therapy Session Note ? ?Patient Details  ?Name: KEIL PICKERING ?MRN: [id number] ?Date of Birth: 04/05/1933 ? ?Today's Date: 07/18/2021 ?OT Individual Time: 0277-4128 ?OT Individual Time Calculation (min): 45 min  ? ? ?Short Term Goals: ?Week 1:  OT Short Term Goal 1 (Week 1): STG = LTG due to ELOS ? ?Skilled Therapeutic Interventions/Progress Updates:  ?  OT intervention with focus on education with wife. Pt practiced TTB tranfsers and walk-in shower transfers. Recommended that pt practice with HHOT to determine safest method at home. Pt and wife in agreement. Pt practiced toilet transfers. Recommended use of BSC over toilet. Pt and wife in agreement. Pt practiced amb with RW to access bathroom at home. Pt completed all tasks with CGA and min verbal cues for safety. Pt propelled w/c back to room. Pt remained in room with all needs within reach.  ? ?Therapy Documentation ?Precautions:  ?Precautions ?Precautions: Fall ?Restrictions ?Weight Bearing Restrictions: Yes ?RLE Weight Bearing: Non weight bearing ?  ?Pain: ? Pt denies pain this morning ? ? ?Therapy/Group: Individual Therapy ? ?[name] ?07/18/2021, 12:17 PM ?

## 2021-07-19 ENCOUNTER — Inpatient Hospital Stay (HOSPITAL_COMMUNITY): Payer: No Typology Code available for payment source

## 2021-07-19 MED ORDER — TRAZODONE HCL 50 MG PO TABS
100.0000 mg | ORAL_TABLET | Freq: Every day | ORAL | Status: DC
  Administered 2021-07-19 – 2021-07-23 (×5): 100 mg via ORAL
  Filled 2021-07-19 (×5): qty 2

## 2021-07-19 MED ORDER — SENNA 8.6 MG PO TABS
2.0000 | ORAL_TABLET | Freq: Every day | ORAL | Status: DC
  Administered 2021-07-19: 17.2 mg via ORAL
  Administered 2021-07-20: 8.6 mg via ORAL
  Administered 2021-07-22 – 2021-07-23 (×2): 17.2 mg via ORAL
  Filled 2021-07-19 (×4): qty 2

## 2021-07-19 MED ORDER — SORBITOL 70 % SOLN: 30.0000 mL | Freq: Once | Status: DC

## 2021-07-19 NOTE — Progress Notes (Signed)
Patient was sitting up on side of bed to eat dinner and got up with urgent toileting need. Staff member noted him standing and responded, got him back to bed safely. Educated patient about the need to call for assistance and not to risk fall. Patient verbalized agreement. ?

## 2021-07-19 NOTE — Progress Notes (Signed)
Occupational Therapy Session Note ? ?Patient Details  ?Name: Edward Waller ?MRN: [id number] ?Date of Birth: [birth year] ? ?Today's Date: 07/19/2021 ?OT Individual Time: 1751-0258 ?OT Individual Time Calculation (min): 70 min  ? ? ?Short Term Goals: ?Week 2:  OT Short Term Goal 1 (Week 2): STG = LTG due to ELOS ? ?Skilled Therapeutic Interventions/Progress Updates:  ?  Pt resting in w/c upon arrival. Bil ELRs had been delivered. OT intervention with focus on ongoing discharge planning, w/c mobility, standing balance, walk-in shower transfers, and safety awareness to increase independence with BADLs. Pt propelled w/c from room to ortho gym and practiced walk-in shower transfers X 4. Pt completed transfers with close supervision. Pt does not have grab bars in shower at home. Pt reports that he "feels safer" with TTB transfer to tub/shower. I reiterated my recommendation to practice both with [name] and follow their recommendation. Pt verbalized understanding. Standing balance activities at [facility] with LOBx1 when reaching cross body to Right upper quadrant with LUE. Educated on home safety. Pt verbalized understanding. Pt propelled w/c back to room. Pt remained in w/c with all needs within reach. ? ?Therapy Documentation ?Precautions:  ?Precautions ?Precautions: Fall ?Restrictions ?Weight Bearing Restrictions: Yes ?RLE Weight Bearing: Non weight bearing ?  ?Pain: ?Pt reports 4/10 low back pain and RLE pain; repositioned ? ? ?Therapy/Group: Individual Therapy ? ?[name] ?07/19/2021, 9:29 AM ?

## 2021-07-19 NOTE — Progress Notes (Signed)
Occupational Therapy Weekly Progress Note ? ?Patient Details  ?Name: Edward Waller ?MRN: [id number] ?Date of Birth: 1932-09-13 ? ?Beginning of progress report period: July 13, 2021 ?End of progress report period: July 19, 2021 ? ? Pt is making steady progress with BADLs and functional tranfers. Pt requires min A for LB dressing tasks without use of AE. Bathing with min A sit<>stand from w/c or EOB. Toilet tranfsers andTTB tranfsers with CGA. Toileting with min A. Pt's wife has participated in therapy sessions. Pt and wife pleased with progress. ? ?Patient continues to demonstrate the following deficits: muscle weakness and acute pain, decreased cardiorespiratoy endurance, and decreased sitting balance, decreased standing balance, and decreased balance strategies and therefore will continue to benefit from skilled OT intervention to enhance overall performance with BADL and Reduce care partner burden. ? ?Patient progressing toward long term goals..  Continue plan of care. ? ?OT Short Term Goals ?Week 1:  OT Short Term Goal 1 (Week 1): STG = LTG due to ELOS ?Week 2:  OT Short Term Goal 1 (Week 2): STG = LTG due to ELOS ?  ? ?Skilled Therapeutic Interventions/Progress Updates:  ?   ? ?Therapy Documentation ?Precautions:  ?Precautions ?Precautions: Fall ?Restrictions ?Weight Bearing Restrictions: Yes ?RLE Weight Bearing: Non weight bearing ?General: ?  ?Vital Signs: ?Therapy Vitals ?Temp: 97.9 ?F (36.6 ?C) ?Pulse Rate: 77 ?Resp: 18 ?BP: 111/69 ?Patient Position (if appropriate): Lying ?Oxygen Therapy ?SpO2: 99 % ?O2 Device: Room Air ?Pain: ?  ?ADL: ?ADL ?Eating: Independent ?Where Assessed-Eating: Bed level ?Grooming: Independent ?Where Assessed-Grooming: Bed level ?Upper Body Bathing: Supervision/safety ?Where Assessed-Upper Body Bathing: Edge of bed ?Lower Body Bathing: Minimal assistance ?Where Assessed-Lower Body Bathing: Edge of bed ?Upper Body Dressing: Minimal assistance ?Where Assessed-Upper Body Dressing:  Edge of bed ?Lower Body Dressing: Moderate assistance ?Where Assessed-Lower Body Dressing: Edge of bed ?Toileting: Moderate assistance ?Where Assessed-Toileting: Bedside Commode ?Toilet Transfer: Minimal assistance ?Toilet Transfer Method: Stand pivot ?Toilet Transfer Equipment: Bedside commode ?Tub/Shower Transfer: Unable to assess ?Tub/Shower Transfer Method: Unable to assess ?Walk-In Shower Transfer: Unable to assess ?Walk-In Shower Transfer Method: Unable to assess ?Vision ?  ?Perception  ?  ?Praxis ?  ?Exercises: ?  ?Other Treatments:   ? ? ?Therapy/Group: Individual Therapy ? ?[name] ?07/19/2021, 6:38 AM  ?

## 2021-07-19 NOTE — Progress Notes (Signed)
Physical Therapy Session Note ? ?Patient Details  ?Name: Edward Waller ?MRN: [id number] ?Date of Birth: 05/31/1932 ? ?Today's Date: 07/19/2021 ?PT Individual Time: 3329-5188 and 4166-0630 ?PT Individual Time Calculation (min): 50 min and 40 min ? ?Short Term Goals: ?Week 1:  PT Short Term Goal 1 (Week 1): STG =LTGs 2/2 to ELOS ? ?Skilled Therapeutic Interventions/Progress Updates: Pt presented in w/c agreeable to therapy. Pt denies pain at start of session with some c/o pain when performing knee flexion towards end of session. PTA and pt discussed extensively regarding home entry set up. Pt thinks they can get ramp installed prior to d/c but PTA advised best to have back up plan prior to d/c in case ramp not built. Pt concerned regarding tilting RW once on step to hop on second step (smaller step per pt and family). PTA and pt created drawings and PTA explained that w/c can be bumped up step and bought around so that after pt hops onto step can sit on w/c without having to take second step. After some time and additional explanation pt was able to understand set up and feel comfortable doing this if ramp not built. Pt then transported to ortho gym to practice ascending/descending ramp with RW as pt had not practiced before. Pt was able to ambulate up/down ramp with CGA and demonstrate good safety with RW and hopping down ramp. Pt then transported to rehab gym and practiced hopping in parallel bars on 6 in step. Pt also performed knee flexion ROM at w/c leve x 5 with PTA providing education on importance of maintaining knee ROM for safe functional mobility. Pt was able to hop forward and backward x 5 with CGA fading to close supervision. Pt then propelled back to room with supervision and left in w/c with belt alarm on, call bell within reach and needs met.  ? ? ?Tx2: Pt presented in w/c agreeable to therapy. Pt denies pain during session. Pt propelled to rehab gym with supervision for general conditioning. Performed  ambulatory transfer to high/low mat with CGA. At mat transferred to supine with supervision and increased time. Pt participated in follow activities to fatigue (avg 2 x 15). Quad set, heel slides, SAQ, hip abd/add, SLR. Pt did require cues for technique to perform appropriate activities with decreased compensation. Pt also performed static hamstring stretch ~4 minutes. Pt then transferred to sitting with supervision and performed LAQ and hamstring curls with red theraband to fatigue (avg 2 x 15). Pt left sitting EOM awaiting for next therapy session with needs met.  ?   ? ?Therapy Documentation ?Precautions:  ?Precautions ?Precautions: Fall ?Restrictions ?Weight Bearing Restrictions: (P) Yes ?RLE Weight Bearing: (P) Non weight bearing ?General: ?  ?Vital Signs: ?Therapy Vitals ?Temp: 98.6 ?F (37 ?C) ?Temp Source: Oral ?Pulse Rate: 62 ?Resp: 15 ?BP: 113/66 ?Patient Position (if appropriate): Sitting ?Oxygen Therapy ?SpO2: 100 % ?O2 Device: Room Air ?Pain: ?  ?Mobility: ?  ?Locomotion : ?   ?Trunk/Postural Assessment : ?   ?Balance: ?  ?Exercises: ?  ?Other Treatments:   ? ? ? ?Therapy/Group: Individual Therapy ? ?Ha Placeres ?07/19/2021, 3:25 PM  ?

## 2021-07-19 NOTE — Progress Notes (Signed)
Physical Therapy Session Note ? ?Patient Details  ?Name: DECKER COGDELL ?MRN: [id number] ?Date of Birth: 1933/01/23 ? ?Today's Date: 07/19/2021 ?PT Individual Time: 9735-3299 ?PT Individual Time Calculation (min): 25 min  ? ?Short Term Goals: ?Week 1:  PT Short Term Goal 1 (Week 1): STG =LTGs 2/2 to ELOS ?Week 2:    ? ?Skilled Therapeutic Interventions/Progress Updates:  ?  Patient received sitting edge of mat from previous therapy session, agreeable to PT. He denies pain. Patient remains hyperverbal throughout session with limited redirection to therapeutic tasks. He hopped back to his room with CGA and RW. Verbal cues needed to slow down at times. Patient requesting to return to bed. He also requested to see his "before" pictures from his fracture. PT showing patient and orienting him to location of fracture and  then associated repair. Patient returning supine, bed alarm on, call light within reach.  ? ?Therapy Documentation ?Precautions:  ?Precautions ?Precautions: Fall ?Restrictions ?Weight Bearing Restrictions: Yes ?RLE Weight Bearing: Non weight bearing ? ? ? ?Therapy/Group: Individual Therapy ? ?Debbora Dus ?Debbora Dus, PT, DPT, CBIS ? ?07/19/2021, 8:29 AM  ?

## 2021-07-19 NOTE — Progress Notes (Signed)
?                                                       PROGRESS NOTE ? ? ?Subjective/Complaints: ? ?Didn't sleep well after 3am, but wife said she thinks due to ruminating and worried about things.  ?Having more pain in front of leg and heel- has hit it on floor a few times- ? ?Lidocaine helps back pain at night-  ?Condom catheter leaked overnight.  ?No BM in 3 days - at least "not a good one".  ? ? ?ROS:  ? ?Pt denies SOB, abd pain, CP, N/V/(+) C/D, and vision changes ? ? ?Objective: ?  ?No results found. ?No results for input(s): WBC, HGB, HCT, PLT in the last 72 hours. ? ?No results for input(s): NA, K, CL, CO2, GLUCOSE, BUN, CREATININE, CALCIUM in the last 72 hours. ? ? ?Intake/Output Summary (Last 24 hours) at 07/19/2021 1017 ?Last data filed at 07/19/2021 6553 ?Gross per 24 hour  ?Intake 1332 ml  ?Output 700 ml  ?Net 632 ml  ?  ? ?  ? ?Physical Exam: ?Vital Signs ?Blood pressure 111/69, pulse 77, temperature 97.9 ?F (36.6 ?C), resp. rate 18, height 6' (1.829 m), weight 78 kg, SpO2 99 %. ? ? ? ? ? ? ? ? ?General: awake, alert, appropriate, sitting up in bedside w/c; wife and nurse in room; NAD ?HENT: conjugate gaze; oropharynx moist- R eye bruising gone and scalp scabs gone ?CV: regular rate; no JVD ?Pulmonary: CTA B/L; no W/R/R- good air movement ?GI: soft, NT, ND, (+)BS- hypoactive- ?Psychiatric: appropriate ?Neurological: Ox3 ? ?Musculoskeletal: still has some palpable swelling on dorsum of R foot- wiggling toes- same today- ace wrap alittle looser ?   Cervical back: Neck supple. No tenderness.  ?   Comments: RLE with intact splint--NV intact. Incision on right knee with sutures in place, mild eythema and C/D/I. Right thigh with layering ecchymosis on inferior aspect. RLE splint re-wrapped and appears firmly in place. stable ?Toes warm.  ?UE strength 5/5 B/L ?LLE 5/5 except DF which is 5-/5 ?RLE- HF 2/5; KE 2+/5; cannot test distally due to cast  ?Skin: ?   Comments: bruising along right forefoot, some  bruising as well LLE ?Mild road rash L forearm, almost healed and ascalp and R eye look great- sutures look good on R knee/distal thigh ?  ?Neurological:  ?   Mental Status: He is alert and oriented to person, place, and time.  ?   Comments: Intact to light touch in all 4 extremities ? Not moving LLE as much today while I was in room ? ? ?Assessment/Plan: ?1. Functional deficits which require 3+ hours per day of interdisciplinary therapy in a comprehensive inpatient rehab setting. ?Physiatrist is providing close team supervision and 24 hour management of active medical problems listed below. ?Physiatrist and rehab team continue to assess barriers to discharge/monitor patient progress toward functional and medical goals ? ?Care Tool: ? ?Bathing ? Bathing activity did not occur: Safety/medical concerns ?Body parts bathed by patient: Right arm, Left arm, Chest, Abdomen, Right upper leg, Left upper leg, Left lower leg, Face  ? Body parts bathed by helper: Buttocks, Front perineal area ?Body parts n/a: Left lower leg (cast) ?  ?Bathing assist Assist Level: Minimal Assistance - Patient > 75% ?  ?  ?Upper Body  Dressing/Undressing ?Upper body dressing Upper body dressing/undressing activity did not occur (including orthotics): Safety/medical concerns ?What is the patient wearing?: Button up shirt ?   ?Upper body assist Assist Level: Minimal Assistance - Patient > 75% ?   ?Lower Body Dressing/Undressing ?Lower body dressing ? ? ? Lower body dressing activity did not occur: Safety/medical concerns ?What is the patient wearing?: Pants ? ?  ? ?Lower body assist Assist for lower body dressing: Moderate Assistance - Patient 50 - 74% ?   ? ?Toileting ?Toileting    ?Toileting assist Assist for toileting: Moderate Assistance - Patient 50 - 74% ?  ?  ?Transfers ?Chair/bed transfer ? ?Transfers assist ? Chair/bed transfer activity did not occur: Safety/medical concerns ? ?Chair/bed transfer assist level: Contact Guard/Touching  assist ?  ?  ?Locomotion ?Ambulation ? ? ?Ambulation assist ? ? Ambulation activity did not occur: Safety/medical concerns ? ?Assist level: Contact Guard/Touching assist ?Assistive device: Walker-rolling ?Max distance: 43f  ? ?Walk 10 feet activity ? ? ?Assist ?   ? ?Assist level: Contact Guard/Touching assist ?Assistive device: Walker-rolling  ? ?Walk 50 feet activity ? ? ?Assist   ? ?Assist level: Contact Guard/Touching assist ?Assistive device: Walker-rolling  ? ? ?Walk 150 feet activity ? ? ?Assist Walk 150 feet activity did not occur: Safety/medical concerns ? ?  ?  ?  ? ?Walk 10 feet on uneven surface  ?activity ? ? ?Assist Walk 10 feet on uneven surfaces activity did not occur: Safety/medical concerns ? ? ?  ?   ? ?Wheelchair ? ? ? ? ?Assist Is the patient using a wheelchair?: Yes ?Type of Wheelchair: Manual ?  ? ?Wheelchair assist level: Supervision/Verbal cueing ?Max wheelchair distance: 150'  ? ? ?Wheelchair 50 feet with 2 turns activity ? ? ? ?Assist ? ?  ?  ? ? ?Assist Level: Supervision/Verbal cueing  ? ?Wheelchair 150 feet activity  ? ? ? ?Assist ?   ? ? ?Assist Level: Supervision/Verbal cueing  ? ?Blood pressure 111/69, pulse 77, temperature 97.9 ?F (36.6 ?C), resp. rate 18, height 6' (1.829 m), weight 78 kg, SpO2 99 %. ? ?Medical Problem List and Plan: ?1. Functional deficits secondary to fall/polytrauma with R tib/fib fx s/p IM nail- NWB and R orbit fx ?            -patient may  shower if cover RLE cast ?            -ELOS/Goals: 12-14 mod I to supervision ? D/c date 07/23/21 ? Continue CIR- PT, OT  ?2.  Antithrombotics: ?-DVT/anticoagulation:  Pharmaceutical: Lovenox X 21 days ?            -antiplatelet therapy:  N/A ?3. Pain Management: Oxycodone prn. ?3/28- doesn't want opiates- will start Lidoderm patches 2- of them on back 8pm to 8a ?3/30- back pain better with lidocaine- con't regimen ?4. Mood/sleep:   ? -continue trazodone PRN only (see #13) ? 3/30- will increase trazodone to 100 mg QHS for  sleep ?            -antipsychotic agents: N/A ?5. Neuropsych: This patient is capable of making decisions on his own behalf. ?6. Skin/Wound Care: Routine pressure relief measures.  ?7. Fluids/Electrolytes/Nutrition: Monitor I/O. Check CMET in am.  ?8. Right tib/fib Fx s/p ORIF: NWB X 6 weeks. Splint X 2 weeks followed by CAM walker ? 3/29- will call Ortho Friday since will be 2 weeks ? 3/30- can change to boot- is already in room- per Ortho- called due to  pt having more RLE pain ?9. Orbital floor Fx w/o entrapment:  Follow up with Dr. Ellie Lunch 2-3 weeks after discharge.  ?            --no blowing nose. ?10. BPH: Has nocturia X 5 at baseline. Was set for GU eval next week.  ?            --will check PVRs to monitor voiding/retention. ?            --discussed I/O caths for retention/limiting pm fluid intake, Flomax.  ? 3/26 UA negative ?  -encouraged OOB to void, double voids ?11. Mildly displaced sternal Fx: Asymptomatic--pathologic? ?--  PSA on 3/24 was 6.48--f/u per primary team ?12. ABLA: f/u hgb 12.5 stable ?13. RLS: On requip at nights and would like am dose added ?            --was also taking Mg bid at home-->now daily-->Mg level normal ? - pt takes requip at night and mirapex during the day as it makes him less sleepy.  ? -3/26 did better last night with requip 1.'5mg'$  -->continue for sleep/RLS ? 3/27- sleeping OK with trazodone and Requip at night ? 3/29- will make trazodone scheduled 50 mg QHS ? 3/30- increase trazodone to 100 mg QHS ?14. Constipation: On Miralax BID. Add senna to colace. ?            --Discussed increasing Mg back to bid if levels WNL. ?3/24- will change senokot s to 1 tab senna QHS and stop Miralax BID for now- esp since not taking pain meds as much.   ?3/28- having daily BM's- sugges to nursing to do on BSC/toilet.  ? 3/30- LBM 3 days ago- will give sorbitol and increase senokot to 2 tabs nightly-  ?15. HTN: Elevated this am--will monitor for trend before treating.  ? 3/29- BP controlled-  con't regimen ? 16. Dispo ? 3/28- daughter doesn't want wife helping, but can offer emotional support ? 3/29- pt going home with wife- supervision level. Will also order rewetting drops for R eye crusting.  ? ? ? ?I s

## 2021-07-20 ENCOUNTER — Inpatient Hospital Stay (HOSPITAL_COMMUNITY): Payer: No Typology Code available for payment source

## 2021-07-20 MED ORDER — LEVOTHYROXINE SODIUM 50 MCG PO TABS
50.0000 ug | ORAL_TABLET | Freq: Every day | ORAL | Status: DC
  Administered 2021-07-21 – 2021-07-23 (×3): 50 ug via ORAL
  Filled 2021-07-20 (×4): qty 1

## 2021-07-20 MED ORDER — CEFADROXIL 500 MG PO CAPS
500.0000 mg | ORAL_CAPSULE | Freq: Two times a day (BID) | ORAL | Status: DC
  Administered 2021-07-20 – 2021-07-24 (×8): 500 mg via ORAL
  Filled 2021-07-20 (×9): qty 1

## 2021-07-20 NOTE — Progress Notes (Signed)
Orthopedic Tech Progress Note ?Patient Details:  ?Donalda Ewings ?Sep 22, 1932 ?[id number] ? ?Ortho Devices ?Type of Ortho Device: CAM walker ?Ortho Device/Splint Location: RLE ?Ortho Device/Splint Interventions: Application ?  ?Post Interventions ?Patient Tolerated: Well ? ?Maybree Riling A Jermine Bibbee ?07/20/2021, 3:09 PM ? ?

## 2021-07-20 NOTE — Progress Notes (Signed)
Physical Therapy Session Note ? ?Patient Details  ?Name: Edward Waller ?MRN: [id number] ?Date of Birth: [birth year] ? ?Today's Date: 07/20/2021 ?PT Individual Time: 2751-7001 ?PT Individual Time Calculation (min): 24 min  ? ?Short Term Goals: ?Week 1:  PT Short Term Goal 1 (Week 1): STG =LTGs 2/2 to ELOS ? ?Skilled Therapeutic Interventions/Progress Updates: Pt presented in w/c agreeable to therapy. Pt noted to have new walking boot on vs bandaged cast. Pt agreeable to trial ambulation with new boot. Pt propelled to day room with supervision and initiated ambulation with RW and w/c follow. Pt ambulated 43f and 876fwith RW and CGA with intermittent close supervision. Pt educated on taking rest breaks as PTA noted decreased L foot clearance with fatigue. Once completed pt propelled back to room and remained in w/c with seat alarm hooked up but not activated as wife present.  ?   ? ?Therapy Documentation ?Precautions:  ?Precautions ?Precautions: Fall ?Restrictions ?Weight Bearing Restrictions: Yes ?RLE Weight Bearing: Non weight bearing ?General: ?  ?Vital Signs: ?Therapy Vitals ?Temp: 98 ?F (36.7 ?C) ?Temp Source: Oral ?Pulse Rate: 89 ?Resp: 17 ?BP: 119/73 ?Patient Position (if appropriate): Sitting ?Oxygen Therapy ?SpO2: 100 % ?O2 Device: Room Air ?Pain: ?  ?Mobility: ?  ?Locomotion : ?   ?Trunk/Postural Assessment : ?Cervical Assessment ?Cervical Assessment: Within Functional Limits ?Thoracic Assessment ?Thoracic Assessment: Within Functional Limits ?Lumbar Assessment ?Lumbar Assessment: Within Functional Limits  ?Balance: ?  ?Exercises: ?  ?Other Treatments:   ? ? ? ?Therapy/Group: Individual Therapy ? ?[name] ?07/20/2021, 4:19 PM  ?

## 2021-07-20 NOTE — Progress Notes (Signed)
Occupational Therapy Session Note ? ?Patient Details  ?Name: Edward Waller ?MRN: [id number] ?Date of Birth: 04/17/1933 ? ?Today's Date: 07/20/2021 ?OT Group Time: 1000-1100 ?OT Group Time Calculation (min): 60 min ? ? ?Short Term Goals: ?Week 1:  OT Short Term Goal 1 (Week 1): STG = LTG due to ELOS ? ?Skilled Therapeutic Interventions/Progress Updates:  ?  Pt participated in rhythmic drumming group. Pain not reproted during session, only soreness from the activity in shoulders, "theyre working[employer] of group on BUE coordination, strengthening, endurance, timing/control, activity tolerance, and social participation and engagement. Pt performs session from seated position for energy conservation. Skilled interventions included grading movements up in larger ROM to increase strengthening in end ranges. Warm up performed prior to exercises and UB stretching completed at end of group with demo from OT. Pt able to select preferred song to share with group. Returned pt to room at end of session. Exited session with pt seated in w/c, exit alarm on and call light in reach ? ? ?Therapy Documentation ?Precautions:  ?Precautions ?Precautions: Fall ?Restrictions ?Weight Bearing Restrictions: Yes ?RLE Weight Bearing: Non weight bearing ?General: ?  ? ? ?Therapy/Group: Group Therapy ? ?[name] ?07/20/2021, 7:00 AM ?

## 2021-07-20 NOTE — Progress Notes (Signed)
[facility]  ? ? ?S: ? ?Doing well ?Follow up xrays R leg look good  ? ? ?O: ?BP 119/73 (BP Location: Right Arm)   Pulse 89   Temp 98 ?F (36.7 ?C) (Oral)   Resp 17   Ht 6' (1.829 m)   Wt 78 kg   SpO2 100%   BMI 23.32 kg/m?  ? ?R leg ?Splint removed ? ?Swelling improved ?Fracture blisters stable, scant drainage ?All surgical wounds healed ?Some mild erythema medial R lower leg along with resolving ecchymosis  ?Motor and sensory functions intact  ?+ DP pulse ? ? ?A/P ? ?2 weeks s/p IMN R distal tibia fracture ? ?Still NWB for another 6 weeks ? ?Converted CAM boot ?Boot only needs to be on when mobilizing/transferring ?Ok to remove boot when in chair ? ?R ankle theraband and heel cord stretching program  ? ?Ok to shower and clean R leg with soap and water ?Ok to dc sutures ? ?Will start a 10 day course of duricef for the erythema as well ? ?Follow up with OTS in 3 weeks upon dc from CIR  ?  ?Jari Pigg, PA-C ?[phone] (C) ?07/20/2021, 2:37 PM ? ?[facility] ?[place] [address] ?[phone] Jenetta Downer) ?[phone] (F) ? ?  ? ? ?Patient ID: Edward Waller, male   DOB: [birth year], 88 y.o.   MRN: [id number] ? ?

## 2021-07-20 NOTE — Progress Notes (Signed)
Occupational Therapy Discharge Summary ? ?Patient Details  ?Name: Edward Waller ?MRN: [id number] ?Date of Birth: 1932-10-12 ? ?Patient has met 10 of 10 long term goals due to improved activity tolerance, improved balance, postural control, ability to compensate for deficits, and improved coordination.  Pt made excellent progress with BADLs and functional transfers during this admission. Is completes bathing and functional transfers with supervision. Dressing with mod I. Toileting with supervision. Pt's wife has participated in therapy and provides the appropriate level of supervision/assistance. Patient to discharge at overall Supervision level.  Patient's care partner is independent to provide the necessary  supervision  assistance at discharge.   ? ?Reasons goals not met: n/a ? ?Recommendation:  ?Patient will benefit from ongoing skilled OT services in home health setting to continue to advance functional skills in the area of BADL and iADL. ? ?Equipment: ?No equipment provided ? ?Reasons for discharge: treatment goals met and discharge from hospital ? ?Patient/family agrees with progress made and goals achieved: Yes ? ?OT Discharge ?ADL ?ADL ?Equipment Provided: Long-handled shoe horn ?Eating: Independent ?Where Assessed-Eating: Wheelchair ?Grooming: Independent ?Where Assessed-Grooming: Wheelchair, Sitting at sink ?Upper Body Bathing: Independent ?Where Assessed-Upper Body Bathing: Wheelchair, Standing at sink ?Lower Body Bathing: Modified independent ?Where Assessed-Lower Body Bathing: Wheelchair, Sitting at sink ?Upper Body Dressing: Independent ?Where Assessed-Upper Body Dressing: Wheelchair ?Lower Body Dressing: Modified independent ?Where Assessed-Lower Body Dressing: Edge of bed ?Toileting: Supervision/safety ?Where Assessed-Toileting: Toilet ?Toilet Transfer: Close supervision ?Toilet Transfer Method: Ambulating ?Toilet Transfer Equipment: Bedside commode ?Tub/Shower Transfer: Close supervison ?Tub/Shower  Transfer Method: Ambulating ?Tub/Shower Equipment: Radio broadcast assistant ?Walk-In Shower Transfer: Unable to assess ?Walk-In Shower Transfer Method: Unable to assess ?Vision ?Baseline Vision/History: 1 Wears glasses ?Patient Visual Report: No change from baseline ?Vision Assessment?: No apparent visual deficits ?Perception  ?Perception: Within Functional Limits ?Praxis ?Praxis: Intact ?Cognition ?Cognition ?Overall Cognitive Status: Within Functional Limits for tasks assessed ?Arousal/Alertness: Awake/alert ?Orientation Level: Person;Situation;Place ?Person: Oriented ?Place: Oriented ?Situation: Oriented ?Memory: Appears intact ?Attention: Sustained ?Sustained Attention: Appears intact ?Awareness: Appears intact ?Problem Solving: Appears intact ?Safety/Judgment: Appears intact ?Brief Interview for Mental Status (BIMS) ?Repetition of Three Words (First Attempt): 3 ?Temporal Orientation: Year: Correct ?Temporal Orientation: Month: Accurate within 5 days ?Temporal Orientation: Day: Correct ?Recall: "Sock": Yes, no cue required ?Recall: "Blue": Yes, no cue required ?Recall: "Bed": Yes, no cue required ?BIMS Summary Score: 15 ?Sensation ?Sensation ?Light Touch: Appears Intact ?Hot/Cold: Appears Intact ?Proprioception: Appears Intact ?Stereognosis: Not tested ?Coordination ?Gross Motor Movements are Fluid and Coordinated: Yes ?Fine Motor Movements are Fluid and Coordinated: Yes ?Motor  ?Motor ?Motor: Within Functional Limits ?   ?Trunk/Postural Assessment  ?Cervical Assessment ?Cervical Assessment: Within Functional Limits ?Thoracic Assessment ?Thoracic Assessment: Within Functional Limits ?Lumbar Assessment ?Lumbar Assessment: Within Functional Limits  ? ?Extremity/Trunk Assessment ?RUE Assessment ?RUE Assessment: Within Functional Limits ?LUE Assessment ?LUE Assessment: Within Functional Limits ? ? ?[name] ?07/20/2021, 1:29 PM ?

## 2021-07-20 NOTE — Progress Notes (Signed)
?                                                       PROGRESS NOTE ? ? ?Subjective/Complaints: ? ?Had bowel accident after sorbitol yesterday- in evening then got nursing concerned about  him when he stood at side of bed to try and pee last evening- and peed on floor.  ?Did get cleaned out, bowel wise ? ?Had some urinary urgency last night as well.  ? ? ?ROS:  ? ? ?Pt denies SOB, abd pain, CP, N/V/C/(+) D, and vision changes ? ?Objective: ?  ?DG Tibia/Fibula Right Port ? ?Result Date: 07/20/2021 ?CLINICAL DATA:  88 year old male with history of tibial fracture. EXAM: PORTABLE RIGHT TIBIA AND FIBULA - 2 VIEW COMPARISON:  07/06/2021 FINDINGS: Unchanged appearance of tibial intramedullary nail. Similar appearing oblique fracture about the distal tibial diaphysis with minimal displacement, near anatomic alignment, unchanged. Similar appearing spiral, mildly comminuted fracture about the proximal fibular diaphysis. Splinting material overlies the lower extremity, slightly obscuring fine osseous detail. IMPRESSION: Similar appearing fractures of the distal tibial diaphysis and proximal fibular diaphysis status post tibial intramedullary nail placement. Electronically Signed   By: Ruthann Cancer M.D.   On: 07/20/2021 08:12  ? ?DG Foot Complete Right ? ?Result Date: 07/19/2021 ?CLINICAL DATA:  Right foot pain. History of recent tib-fib fractures. EXAM: RIGHT FOOT COMPLETE - 3+ VIEW COMPARISON:  Radiographs 07/06/2021 FINDINGS: Bony detail is somewhat obscured by a plaster splint. 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 No acute fracture is identified. Marked pes cavus. IMPRESSION: 1. No acute bony findings. 2. 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 3. Pes cavus. Electronically Signed   By: Marijo Sanes M.D.   On: 07/19/2021 10:19   ?No results for input(s): WBC, HGB, HCT, PLT in the last 72 hours. ? ?No results for input(s): NA, K, CL, CO2, GLUCOSE, BUN, CREATININE, CALCIUM in  the last 72 hours. ? ? ?Intake/Output Summary (Last 24 hours) at 07/20/2021 1410 ?Last data filed at 07/20/2021 1300 ?Gross per 24 hour  ?Intake 354 ml  ?Output --  ?Net 354 ml  ?  ? ?  ? ?Physical Exam: ?Vital Signs ?Blood pressure 119/73, pulse 89, temperature 98 ?F (36.7 ?C), temperature source Oral, resp. rate 17, height 6' (1.829 m), weight 78 kg, SpO2 100 %. ? ? ? ? ? ? ? ? ? ?General: awake, alert, appropriate, sitting up slightly in bed; NAD ?HENT: conjugate gaze; oropharynx moist- R eye bruising almost gone and scalp looks great ?CV: regular rate; no JVD ?Pulmonary: CTA B/L; no W/R/R- good air movement ?GI: soft, NT, ND, (+)BS ?Psychiatric: appropriate- talkative ?Neurological: Ox3 ?Skin; sutures look great above R knee-  ?Musculoskeletal: still has some palpable swelling on dorsum of R foot- wiggling toes- same today- ace wrap alittle looser ?   Cervical back: Neck supple. No tenderness.  ?   Comments: RLE with intact splint--NV intact. Incision on right knee with sutures in place, mild eythema and C/D/I. Right thigh with layering ecchymosis on inferior aspect. RLE splint re-wrapped and appears firmly in place. stable ?Toes warm.  ?UE strength 5/5 B/L ?LLE 5/5 except DF which is 5-/5 ?RLE- HF 2/5; KE 2+/5; cannot test distally due to cast  ?Skin: ?   Comments: bruising along right forefoot, some  bruising as well LLE ?Mild road rash L forearm, almost healed and ascalp and R eye look great- sutures look good on R knee/distal thigh ?  ?Neurological:  ?   Mental Status: He is alert and oriented to person, place, and time.  ?   Comments: Intact to light touch in all 4 extremities ? Not moving LLE as much today while I was in room ? ? ?Assessment/Plan: ?1. Functional deficits which require 3+ hours per day of interdisciplinary therapy in a comprehensive inpatient rehab setting. ?Physiatrist is providing close team supervision and 24 hour management of active medical problems listed below. ?Physiatrist and rehab  team continue to assess barriers to discharge/monitor patient progress toward functional and medical goals ? ?Care Tool: ? ?Bathing ? Bathing activity did not occur: Safety/medical concerns ?Body parts bathed by patient: Right arm, Left arm, Chest, Abdomen, Right upper leg, Left upper leg, Left lower leg, Face, Front perineal area, Buttocks  ? Body parts bathed by helper: Buttocks, Front perineal area ?Body parts n/a: Right lower leg ?  ?Bathing assist Assist Level: Contact Guard/Touching assist ?  ?  ?Upper Body Dressing/Undressing ?Upper body dressing Upper body dressing/undressing activity did not occur (including orthotics): Safety/medical concerns ?What is the patient wearing?: Pull over shirt ?   ?Upper body assist Assist Level: Independent ?   ?Lower Body Dressing/Undressing ?Lower body dressing ? ? ? Lower body dressing activity did not occur: Safety/medical concerns ?What is the patient wearing?: Pants ? ?  ? ?Lower body assist Assist for lower body dressing: Contact Guard/Touching assist ?   ? ?Toileting ?Toileting    ?Toileting assist Assist for toileting: Moderate Assistance - Patient 50 - 74% ?  ?  ?Transfers ?Chair/bed transfer ? ?Transfers assist ? Chair/bed transfer activity did not occur: Safety/medical concerns ? ?Chair/bed transfer assist level: Contact Guard/Touching assist ?  ?  ?Locomotion ?Ambulation ? ? ?Ambulation assist ? ? Ambulation activity did not occur: Safety/medical concerns ? ?Assist level: Contact Guard/Touching assist ?Assistive device: Walker-rolling ?Max distance: 29f  ? ?Walk 10 feet activity ? ? ?Assist ?   ? ?Assist level: Contact Guard/Touching assist ?Assistive device: Walker-rolling  ? ?Walk 50 feet activity ? ? ?Assist   ? ?Assist level: Contact Guard/Touching assist ?Assistive device: Walker-rolling  ? ? ?Walk 150 feet activity ? ? ?Assist Walk 150 feet activity did not occur: Safety/medical concerns ? ?  ?  ?  ? ?Walk 10 feet on uneven surface  ?activity ? ? ?Assist  Walk 10 feet on uneven surfaces activity did not occur: Safety/medical concerns ? ? ?  ?   ? ?Wheelchair ? ? ? ? ?Assist Is the patient using a wheelchair?: Yes ?Type of Wheelchair: Manual ?  ? ?Wheelchair assist level: Supervision/Verbal cueing ?Max wheelchair distance: 150'  ? ? ?Wheelchair 50 feet with 2 turns activity ? ? ? ?Assist ? ?  ?  ? ? ?Assist Level: Supervision/Verbal cueing  ? ?Wheelchair 150 feet activity  ? ? ? ?Assist ?   ? ? ?Assist Level: Supervision/Verbal cueing  ? ?Blood pressure 119/73, pulse 89, temperature 98 ?F (36.7 ?C), temperature source Oral, resp. rate 17, height 6' (1.829 m), weight 78 kg, SpO2 100 %. ? ?Medical Problem List and Plan: ?1. Functional deficits secondary to fall/polytrauma with R tib/fib fx s/p IM nail- NWB and R orbit fx ?            -patient may  shower if cover RLE cast ?            -  ELOS/Goals: 12-14 mod I to supervision ? D/c date 07/23/21 ? Continue CIR- PT, OT - can switch to CAM boot per Ortho PA- will check into suture removal? ?2.  Antithrombotics: ?-DVT/anticoagulation:  Pharmaceutical: Lovenox X 21 days ?            -antiplatelet therapy:  N/A ?3. Pain Management: Oxycodone prn. ?3/28- doesn't want opiates- will start Lidoderm patches 2- of them on back 8pm to 8a ?3/30- back pain better with lidocaine- con't regimen ?4. Mood/sleep:   ? -continue trazodone PRN only (see #13) ? 3/30- will increase trazodone to 100 mg QHS for sleep ?            -antipsychotic agents: N/A ?5. Neuropsych: This patient is capable of making decisions on his own behalf. ?6. Skin/Wound Care: Routine pressure relief measures.  ?7. Fluids/Electrolytes/Nutrition: Monitor I/O. Check CMET in am.  ?8. Right tib/fib Fx s/p ORIF: NWB X 6 weeks. Splint X 2 weeks followed by CAM walker ? 3/29- will call Ortho Friday since will be 2 weeks ? 3/30- can change to boot- is already in room- per Ortho- called due to pt having more RLE pain ? 3/31- xrays look great- changed to CAM walker per Ortho PA ?9.  Orbital floor Fx w/o entrapment:  Follow up with Dr. Ellie Lunch 2-3 weeks after discharge.  ?            --no blowing nose. ?10. BPH: Has nocturia X 5 at baseline. Was set for GU eval next week.  ?

## 2021-07-20 NOTE — Progress Notes (Signed)
Occupational Therapy Session Note ? ?Patient Details  ?Name: Edward Waller ?MRN: [id number] ?Date of Birth: 03-16-1933 ? ?Today's Date: 07/20/2021 ?OT Individual Time: 2202-5427 ?OT Individual Time Calculation (min): 30 min  ? ? ?Short Term Goals: ?Week 2:  OT Short Term Goal 1 (Week 2): STG = LTG due to ELOS ? ?Skilled Therapeutic Interventions/Progress Updates:  ?  Pt resting in w/c upon arrival. Wife present. Ortho had just removed splint and provided CAM boot. Pt still with NWB RLE. Pt practiced amb with RW into/out of BR and practiced toilet transfer. Wife assisted pt appropriately and checked off to assist pt in room. RN Marjorie Smolder notified and safety plan updated. Handoff to PTA. ? ?Therapy Documentation ?Precautions:  ?Precautions ?Precautions: Fall ?Restrictions ?Weight Bearing Restrictions: Yes ?RLE Weight Bearing: Non weight bearing ?Pain: ? Pt denies pain ? ?Therapy/Group: Individual Therapy ? ?[name] ?07/20/2021, 2:22 PM ?

## 2021-07-20 NOTE — Progress Notes (Signed)
Occupational Therapy Session Note ? ?Patient Details  ?Name: Edward Waller ?MRN: [id number] ?Date of Birth: 06-28-1932 ? ?Today's Date: 07/20/2021 ?OT Individual Time: 0845-1000 ?OT Individual Time Calculation (min): 75 min  ? ? ?Short Term Goals: ?Week 2:  OT Short Term Goal 1 (Week 2): STG = LTG due to ELOS ? ?Skilled Therapeutic Interventions/Progress Updates:  ?  Pt resting in bed upon arrival. Initial focus on bed mobility, donning pants and sock/shoe on LLE. Supine>sit EOB with supervision. Min A for donning shoe. Pt tranfserred to w/c to complete grooming tasks at sink. Pt engaged in standing activities in gym. Sit<>stand and standing balance with CGA. Pt tossed 12 horseshoes X 3 with no LOB. W/c mobility with supervision. Pt returned to room and reamined in w/c with all needs wihtin reach.  ? ?Therapy Documentation ?Precautions:  ?Precautions ?Precautions: Fall ?Restrictions ?Weight Bearing Restrictions: Yes ?RLE Weight Bearing: Non weight bearing ? ?Pain: ? Pt denies pain this morning ? ? ?Therapy/Group: Individual Therapy ? ?[name] ?07/20/2021, 10:03 AM ?

## 2021-07-20 NOTE — Progress Notes (Addendum)
Sutures removed per order. Patient tolerated.  fluid blisters noted  3 sites right lower extremity.Off loading done. ?

## 2021-07-21 NOTE — Progress Notes (Signed)
Mrs Sones (Patient's wife) Explained then demonstrated the proper way to administer ?Lovenox injections successfully. ?

## 2021-07-21 NOTE — Progress Notes (Signed)
Continent with Wife assisting patient with urinal. RLE swollen with erythema to foot. Bilateral heels elevated off bed. At 0300, complained of RLE "jerking", PRN tylenol given at 0318. Able to go back to sleep. Patrici Ranks A  ?

## 2021-07-22 MED ORDER — ROPINIROLE HCL 1 MG PO TABS
2.0000 mg | ORAL_TABLET | Freq: Every day | ORAL | Status: DC
  Administered 2021-07-22 – 2021-07-23 (×2): 2 mg via ORAL
  Filled 2021-07-22 (×2): qty 2

## 2021-07-22 NOTE — Progress Notes (Signed)
Physical Therapy Discharge Summary ? ?Patient Details  ?Name: Edward Waller ?MRN: [id number] ?Date of Birth: [birth year] ? ?Today's Date: 07/22/2021 ? ?Patient has met 9 of 10 long term goals due to improved activity tolerance, improved balance, increased strength, decreased pain, improved awareness, and improved coordination.  Patient to discharge at an ambulatory level Supervision.   Patient's care partner is independent to provide the necessary physical assistance at discharge. ? ?Reasons goals not met: Pt did not meet ambulation goal due to decreased endurance. Pt was able to ambulate up to 74f with supervision ? ?Recommendation:  ?Patient will benefit from ongoing skilled PT services in home health setting to continue to advance safe functional mobility, address ongoing impairments in endurance, strength, safety, balance, ROM, independence with functional mobility, pain management, and minimize fall risk. ? ?Equipment: ?Pt received 18x18 w/c with ELR and RW in acute care setting. ? ?Reasons for discharge: treatment goals met and discharge from hospital ? ?Patient/family agrees with progress made and goals achieved: Yes ? ?PT Discharge ?Precautions/Restrictions ?Precautions ?Precautions: Fall ?Restrictions ?Weight Bearing Restrictions: Yes ?RLE Weight Bearing: Non weight bearing ?Pain Interference ?Pain Interference ?Pain Effect on Sleep: 1. Rarely or not at all ?Pain Interference with Therapy Activities: 1. Rarely or not at all ?Pain Interference with Day-to-Day Activities: 1. Rarely or not at all ?Vision/Perception  ?Vision - History ?Ability to See in Adequate Light: 0 Adequate ?Perception ?Perception: Within Functional Limits ?Praxis ?Praxis: Intact  ?Cognition ?Overall Cognitive Status: Within Functional Limits for tasks assessed ?Arousal/Alertness: Awake/alert ?Orientation Level: Oriented X4 ?Attention: Sustained ?Sustained Attention: Appears intact ?Memory: Appears intact ?Awareness: Appears intact ?Problem  Solving: Appears intact ?Safety/Judgment: Appears intact ?Sensation ?Sensation ?Light Touch: Appears Intact ?Hot/Cold: Appears Intact ?Proprioception: Appears Intact ?Stereognosis: Not tested ?Coordination ?Gross Motor Movements are Fluid and Coordinated: Yes ?Motor  ?Motor ?Motor: Within Functional Limits  ?Mobility ?Bed Mobility ?Bed Mobility: Supine to Sit;Rolling Left;Rolling Right ?Rolling Right: Independent with assistive device ?Rolling Left: Independent with assistive device ?Supine to Sit: Independent with assistive device ?Transfers ?Transfers: Sit to Stand;Stand to SLockheed MartinTransfers ?Sit to Stand: Independent with assistive device ?Stand to Sit: Independent with assistive device ?Stand Pivot Transfers: Supervision/Verbal cueing ?Transfer (Assistive device): Rolling walker ?Locomotion  ?Gait ?Ambulation: Yes ?Gait Assistance: Supervision/Verbal cueing ?Gait Distance (Feet): 85 Feet ?Assistive device: Rolling walker ?Gait Assistance Details: decreased L foot clearance with fatigue ?Gait ?Gait: Yes ?Gait Pattern: Impaired ?Gait Pattern: Decreased stride length;Poor foot clearance - left ?Stairs / Additional Locomotion ?Stairs: Yes ?Stairs Assistance: Supervision/Verbal cueing ?Stair Management Technique: With walker ?Height of Stairs: 6 ?Ramp: Supervision/Verbal cueing ?Curb: Supervision/Verbal cueing ?Wheelchair Mobility ?Wheelchair Mobility: Yes ?Wheelchair Assistance: Set up assist ?Wheelchair Propulsion: Both upper extremities ?Wheelchair Parts Management: Needs assistance ?Distance: 2039f ?Trunk/Postural Assessment  ?Cervical Assessment ?Cervical Assessment: Within Functional Limits ?Thoracic Assessment ?Thoracic Assessment: Within Functional Limits ?Lumbar Assessment ?Lumbar Assessment: Within Functional Limits ?Postural Control ?Postural Control: Within Functional Limits  ?Balance ?Balance ?Balance Assessed: Yes ?Static Sitting Balance ?Static Sitting - Balance Support: Feet supported ?Static  Sitting - Level of Assistance: 6: Modified independent (Device/Increase time) ?Dynamic Sitting Balance ?Dynamic Sitting - Balance Support: No upper extremity supported ?Dynamic Sitting - Level of Assistance: 6: Modified independent (Device/Increase time) ?Static Standing Balance ?Static Standing - Balance Support: Bilateral upper extremity supported ?Static Standing - Level of Assistance: 6: Modified independent (Device/Increase time) ?Extremity Assessment  ?  ?  ?RLE Assessment ?RLE Assessment: Exceptions to [facility]FLat[facility] HospitalGeneral Strength Comments: Grossly 4/5 proximally, 4/5 knee extension/flexion, ankle NT ?LLE  Assessment ?LLE Assessment: Within Functional Limits ? ? ? ?Edward Waller ?07/22/2021, 12:42 PM ?

## 2021-07-22 NOTE — Progress Notes (Addendum)
Occupational Therapy Session Note ? ?Patient Details  ?Name: Edward Waller ?MRN: [id number] ?Date of Birth: 03-29-1933 ? ?Today's Date: 07/22/2021 ?OT Individual Time: 4827-0786 ?OT Individual Time Calculation (min): 45 min  ? ? ?Short Term Goals: ?Week 1:  OT Short Term Goal 1 (Week 1): STG = LTG due to ELOS ? ?Skilled Therapeutic Interventions/Progress Updates:  ?  Pt completes BADLs at supervision-MOD I level overall. Pt with new boot requiring increased cuing for LB dressing as this was the first the time pt practicing managing the boot. Pt able to complete donning boot and shoe/sock with stool. Pt uses reacher to don pants. Pt demo good safety awareness/use of RW/management. Pt very talkative throughout session impacting efficiency of ADL. Exited session with pt seated in bed, exit alarm on and call light in reach ? ? ?Therapy Documentation ?Precautions:  ?Precautions ?Precautions: Fall ?Restrictions ?Weight Bearing Restrictions: Yes ?RLE Weight Bearing: Non weight bearing ?General: ?  ?Vital Signs: ?  ?Pain: ?  ? ?Therapy/Group: Individual Therapy ? ?Lowella Dell Belynda Pagaduan ?07/22/2021, 9:53 AM ?

## 2021-07-22 NOTE — Progress Notes (Signed)
Slept good until 0300, complained of BLE's  "jerking", right > left. Needs assistance with urinal.Romone Shaff, Roselyn Reef A  ?

## 2021-07-22 NOTE — Progress Notes (Signed)
?                                                       PROGRESS NOTE ? ? ?Subjective/Complaints: ? ? ? ? ?ROS:  ? ? ?Pt denies SOB, abd pain, CP, N/V/C/(+) D, and vision changes ? ?Objective: ?  ?No results found. ?No results for input(s): WBC, HGB, HCT, PLT in the last 72 hours. ? ?No results for input(s): NA, K, CL, CO2, GLUCOSE, BUN, CREATININE, CALCIUM in the last 72 hours. ? ? ?Intake/Output Summary (Last 24 hours) at 07/22/2021 1319 ?Last data filed at 07/22/2021 0720 ?Gross per 24 hour  ?Intake 680 ml  ?Output 1150 ml  ?Net -470 ml  ? ?  ? ?  ? ?Physical Exam: ?Vital Signs ?Blood pressure 137/82, pulse 73, temperature 97.9 ?F (36.6 ?C), temperature source Oral, resp. rate 18, height 6' (1.829 m), weight 78 kg, SpO2 97 %. ? ? ?Skin; sutures look great above R knee- ecchymosis pretib, healed blister RIgh tmedial supramalleolar area ?Musculoskeletal: still has some palpable swelling on dorsum of R foot- wiggling toes- same today- ace wrap alittle looser ?   Cervical back: Neck supple. No tenderness.  ?   Comments: Mild achilles pain with ankle DF  ?Toes warm.  ?UE strength 5/5 B/L ?LLE 5/5 except DF which is 5-/5 ?RLE- HF 2/5; KE 2+/5; cannot test distally due to cast  ?Skin: ?   Comments: bruising along right forefoot, some bruising as well LLE ?Mild road rash L forearm, almost healed and ascalp and R eye look great- sutures look good on R knee/distal thigh ?  ?Neurological:  ?   Mental Status: He is alert and oriented to person, place, and time.  ?   Comments: Intact to light touch in all 4 extremities ? Not moving LLE as much today while I was in room ? ? ?Assessment/Plan: ?1. Functional deficits which require 3+ hours per day of interdisciplinary therapy in a comprehensive inpatient rehab setting. ?Physiatrist is providing close team supervision and 24 hour management of active medical problems listed below. ?Physiatrist and rehab team continue to assess barriers to discharge/monitor patient progress toward  functional and medical goals ? ?Care Tool: ? ?Bathing ? Bathing activity did not occur: Safety/medical concerns ?Body parts bathed by patient: Right arm, Left arm, Chest, Abdomen, Right upper leg, Left upper leg, Left lower leg, Face, Front perineal area, Buttocks  ? Body parts bathed by helper: Buttocks, Front perineal area ?Body parts n/a: Right lower leg ?  ?Bathing assist Assist Level: Supervision/Verbal cueing ?  ?  ?Upper Body Dressing/Undressing ?Upper body dressing Upper body dressing/undressing activity did not occur (including orthotics): Safety/medical concerns ?What is the patient wearing?: Pull over shirt ?   ?Upper body assist Assist Level: Independent ?   ?Lower Body Dressing/Undressing ?Lower body dressing ? ? ? Lower body dressing activity did not occur: Safety/medical concerns ?What is the patient wearing?: Pants ? ?  ? ?Lower body assist Assist for lower body dressing: Supervision/Verbal cueing ?   ? ?Toileting ?Toileting    ?Toileting assist Assist for toileting: Supervision/Verbal cueing ?  ?  ?Transfers ?Chair/bed transfer ? ?Transfers assist ? Chair/bed transfer activity did not occur: Safety/medical concerns ? ?Chair/bed transfer assist level: Supervision/Verbal cueing ?  ?  ?Locomotion ?Ambulation ? ? ?Ambulation assist ? ?  Ambulation activity did not occur: Safety/medical concerns ? ?Assist level: Supervision/Verbal cueing ?Assistive device: Walker-rolling ?Max distance: 11f  ? ?Walk 10 feet activity ? ? ?Assist ?   ? ?Assist level: Supervision/Verbal cueing ?Assistive device: Walker-rolling  ? ?Walk 50 feet activity ? ? ?Assist   ? ?Assist level: Supervision/Verbal cueing ?Assistive device: Walker-rolling  ? ? ?Walk 150 feet activity ? ? ?Assist Walk 150 feet activity did not occur: Safety/medical concerns ? ?  ?  ?  ? ?Walk 10 feet on uneven surface  ?activity ? ? ?Assist Walk 10 feet on uneven surfaces activity did not occur: Safety/medical concerns ? ? ?  ?    ? ?Wheelchair ? ? ? ? ?Assist Is the patient using a wheelchair?: Yes ?Type of Wheelchair: Manual ?  ? ?Wheelchair assist level: Independent ?Max wheelchair distance: 2063f ? ? ?Wheelchair 50 feet with 2 turns activity ? ? ? ?Assist ? ?  ?  ? ? ?Assist Level: Independent  ? ?Wheelchair 150 feet activity  ? ? ? ?Assist ?   ? ? ?Assist Level: Independent  ? ?Blood pressure 137/82, pulse 73, temperature 97.9 ?F (36.6 ?C), temperature source Oral, resp. rate 18, height 6' (1.829 m), weight 78 kg, SpO2 97 %. ? ?Medical Problem List and Plan: ?1. Functional deficits secondary to fall/polytrauma with R tib/fib fx s/p IM nail- NWB and R orbit fx ?            -patient may  shower if cover RLE cast ?            -ELOS/Goals: 12-14 mod I to supervision ? D/c date 07/23/21 ? Continue CIR- PT, OT - can switch to CAM boot per Ortho PA- will check into suture removal? ?2.  Antithrombotics: ?-DVT/anticoagulation:  Pharmaceutical: Lovenox X 21 days ?            -antiplatelet therapy:  N/A ?3. Pain Management: Oxycodone prn. ?3/28- doesn't want opiates- will start Lidoderm patches 2- of them on back 8pm to 8a ?3/30- back pain better with lidocaine- con't regimen ?4. Mood/sleep:   ? -continue trazodone PRN only (see #13) ? 3/30- will increase trazodone to 100 mg QHS for sleep ?            -antipsychotic agents: N/A ?5. Neuropsych: This patient is capable of making decisions on his own behalf. ?6. Skin/Wound Care: Routine pressure relief measures.  ?7. Fluids/Electrolytes/Nutrition: Monitor I/O. Check CMET in am.  ?8. Right tib/fib Fx s/p ORIF: NWB X 6 weeks. Splint X 2 weeks followed by CAM walker ? 3/29- will call Ortho Friday since will be 2 weeks ? 3/30- can change to boot- is already in room- per Ortho- called due to pt having more RLE pain ? 3/31- xrays look great- changed to CAM walker per Ortho PA ?9. Orbital floor Fx w/o entrapment:  Follow up with Dr. McEllie Lunch-3 weeks after discharge.  ?            --no blowing nose. ?10. BPH:  Has nocturia X 5 at baseline. Was set for GU eval next week.  ?            --will check PVRs to monitor voiding/retention. ?            --discussed I/O caths for retention/limiting pm fluid intake, Flomax.  ? 3/26 UA negative ?  -encouraged OOB to void, double voids ?11. Mildly displaced sternal Fx: Asymptomatic--pathologic? ?--  PSA on 3/24 was 6.48--f/u per primary team ?  12. ABLA: f/u hgb 12.5 stable ?13. RLS: daytime requip causes drowsiness for pt  ?            --was also taking Mg bid at home-->now daily-->Mg level normal ? - pt takes requip at night and mirapex during the day as it makes him less sleepy.  ? -3/26 did better last night with requip 1.'5mg'$  -->continue for sleep/RLS ? 3/27- sleeping OK with trazodone and Requip at night ? 3/29- will make trazodone scheduled 50 mg QHS ? 3/30- increase trazodone to 100 mg QHS ? 4/2 slept ~5h last noc per sleep graph RLS still an issue,  d/c am dose, increased pm dose to '2mg'$   ?14. Constipation: On Miralax BID. Add senna to colace. ?            --Discussed increasing Mg back to bid if levels WNL. ?3/24- will change senokot s to 1 tab senna QHS and stop Miralax BID for now- esp since not taking pain meds as much.   ?3/28- having daily BM's- sugges to nursing to do on BSC/toilet.  ? 3/30- LBM 3 days ago- will give sorbitol and increase senokot to 2 tabs nightly-  ? 3/31- pt had bowel accident due to sorbitol - but cleaned out- con't bowel meds ?Per pt and wife, constipation better 4/2 ?15. HTN: Elevated this am--will monitor for trend before treating.  ? 3/29- BP controlled- con't regimen ? 16. Dispo ? 3/28- daughter doesn't want wife helping, but can offer emotional support ? 3/29- pt going home with wife- supervision level. Will also order rewetting drops for R eye crusting.  ? 3/31- plans on getting ramp ? ? ? ? ? ?LOS: ?10 days ?A FACE TO FACE EVALUATION WAS PERFORMED ? ?Luanna Salk Jaelene Garciagarcia ?07/22/2021, 1:19 PM  ? ? ? ?

## 2021-07-22 NOTE — Progress Notes (Signed)
Physical Therapy Session Note ? ?Patient Details  ?Name: Edward Waller ?MRN: [id number] ?Date of Birth: 04/10/1933 ? ?Today's Date: 07/22/2021 ?PT Individual Time: 9480-1655 ?PT Individual Time Calculation (min): 60 min  ? ?Short Term Goals: ?Week 1:  PT Short Term Goal 1 (Week 1): STG =LTGs 2/2 to ELOS ? ?Skilled Therapeutic Interventions/Progress Updates: Pt presented in w/c agreeable to therapy. Session focused on functional activities in preparation for d/c. Pt transported to ortho gym for time management and performed car transfer and ramp with supervision. Discussed with pt if ramp in place with pt stating not built as of yet. Pt then propelled to rehab gym and practiced hopping on/off 6in step. Pt was able to perform with light CGA progressing to supervision. Discussed with pt bed mobility as was supervision level previously with this therapist with pt stating had not needed assistance for bed mobility. Continued education on energy conservation in preparation for d/c. Pt then ambulated back to room with RW however limited by endurance and fatigued at approx 76f. Pt returned to room at end of session and remained in w/c. Pt left with call bell within reach and needs met.  ?   ? ?Therapy Documentation ?Precautions:  ?Precautions ?Precautions: Fall ?Restrictions ?Weight Bearing Restrictions: Yes ?RLE Weight Bearing: Non weight bearing ?General: ?  ?Vital Signs: ?  ?Pain: ?Pain Assessment ?Pain Scale: 0-10 ?Pain Score: 3  ?Mobility: ?Bed Mobility ?Bed Mobility: Supine to Sit;Rolling Left;Rolling Right ?Rolling Right: Independent with assistive device ?Rolling Left: Independent with assistive device ?Supine to Sit: Independent with assistive device ?Transfers ?Transfers: Sit to Stand;Stand to SLockheed MartinTransfers ?Sit to Stand: Independent with assistive device ?Stand to Sit: Independent with assistive device ?Stand Pivot Transfers: Supervision/Verbal cueing ?Transfer (Assistive device): Rolling  walker ?Locomotion : ?Gait ?Ambulation: Yes ?Gait Assistance: Supervision/Verbal cueing ?Gait Distance (Feet): 85 Feet ?Assistive device: Rolling walker ?Gait Assistance Details: decreased L foot clearance with fatigue ?Gait ?Gait: Yes ?Gait Pattern: Impaired ?Gait Pattern: Decreased stride length;Poor foot clearance - left ?Stairs / Additional Locomotion ?Stairs: Yes ?Stairs Assistance: Supervision/Verbal cueing ?Stair Management Technique: With walker ?Height of Stairs: 6 ?Ramp: Supervision/Verbal cueing ?Curb: Supervision/Verbal cueing ?Wheelchair Mobility ?Wheelchair Mobility: Yes ?Wheelchair Assistance: Set up assist ?Wheelchair Propulsion: Both upper extremities ?Wheelchair Parts Management: Needs assistance ?Distance: 2060f ?Trunk/Postural Assessment : ?Cervical Assessment ?Cervical Assessment: Within Functional Limits ?Thoracic Assessment ?Thoracic Assessment: Within Functional Limits ?Lumbar Assessment ?Lumbar Assessment: Within Functional Limits ?Postural Control ?Postural Control: Within Functional Limits  ?Balance: ?Balance ?Balance Assessed: Yes ?Static Sitting Balance ?Static Sitting - Balance Support: Feet supported ?Static Sitting - Level of Assistance: 6: Modified independent (Device/Increase time) ?Dynamic Sitting Balance ?Dynamic Sitting - Balance Support: No upper extremity supported ?Dynamic Sitting - Level of Assistance: 6: Modified independent (Device/Increase time) ?Static Standing Balance ?Static Standing - Balance Support: Bilateral upper extremity supported ?Static Standing - Level of Assistance: 6: Modified independent (Device/Increase time) ?Exercises: ?  ?Other Treatments:   ? ? ? ?Therapy/Group: Individual Therapy ? ?Janeya Deyo ?07/22/2021, 12:57 PM  ?

## 2021-07-23 LAB — BASIC METABOLIC PANEL
Anion gap: 7 (ref 5–15)
BUN: 15 mg/dL (ref 8–23)
CO2: 28 mmol/L (ref 22–32)
Calcium: 9.4 mg/dL (ref 8.9–10.3)
Chloride: 104 mmol/L (ref 98–111)
Creatinine, Ser: 0.97 mg/dL (ref 0.61–1.24)
GFR, Estimated: >60 mL/min (ref >60)
Glucose, Bld: 124 mg/dL — ABNORMAL HIGH (ref 70–99)
Potassium: 4.8 mmol/L (ref 3.5–5.1)
Sodium: 139 mmol/L (ref 135–145)

## 2021-07-23 LAB — CBC
HCT: 40.4 % (ref 39.0–52.0)
Hemoglobin: 13.5 g/dL (ref 13.0–17.0)
MCH: 32.8 pg (ref 26.0–34.0)
MCHC: 33.4 g/dL (ref 30.0–36.0)
MCV: 98.3 fL (ref 80.0–100.0)
Platelets: 411 10*3/uL — ABNORMAL HIGH (ref 150–400)
RBC: 4.11 MIL/uL — ABNORMAL LOW (ref 4.22–5.81)
RDW: 12.9 % (ref 11.5–15.5)
WBC: 5.7 10*3/uL (ref 4.0–10.5)
nRBC: 0 % (ref 0.0–0.2)

## 2021-07-23 NOTE — Discharge Instructions (Addendum)
Inpatient Rehab Discharge Instructions ? ?Edward Waller ?Discharge date and time:  07/24/21 ? ?Activities/Precautions/ Functional Status: ?Activity: no lifting, driving, or strenuous exercise till cleared by MD  ?Diet: regular diet ?Wound Care: keep wound clean and dry Contact Dr. Marcelino Scot if you develop any problems with your incision/wound--redness, swelling, increase in pain, drainage or if you develop fever or chills.   ? ? ?Functional status:  ?___ No restrictions     ___ Walk up steps independently ?_X__ 24/7 supervision/assistance   ___ Walk up steps with assistance ?___ Intermittent supervision/assistance  ___ Bathe/dress independently ?___ Walk with walker     ___ Bathe/dress with assistance ?___ Walk Independently    ___ Shower independently ?_X__ Walk with assistance    _X__ Shower with assistance ?___ No alcohol     ___ Return to work/school ________ ? ?Special Instructions: ?No weight on right foot. Wear boot for support ---can remove it/take a break when in chair or bed.   ? ? ?My questions have been answered and I understand these instructions. I will adhere to these goals and the provided educational materials after my discharge from the hospital. ? ?Patient/Caregiver Signature _______________________________ Date __________ ? ?Clinician Signature _______________________________________ Date __________ ? ?Please bring this form and your medication list with you to all your follow-up doctor's appointments.   ? ? ? ? ?COMMUNITY REFERRALS UPON DISCHARGE:   ? ?Home Health:   PT     OT     SNA     ?               Agency: [facility]  Phone: [phone] ?*Please expect follow-up within 2-3 days to schedule your home visit. If you have not received follow-up, be sure to contact the branch directly.* ? ? ?Medical Equipment/Items Ordered: wheelchair with elevating leg rests and rolling walker ?                                                [facility] # [phone] ? ?

## 2021-07-23 NOTE — Progress Notes (Signed)
Inpatient Rehabilitation Discharge Medication Review by a Pharmacist ? ?A complete drug regimen review was completed for this patient to identify any potential clinically significant medication issues. ? ?High Risk Drug Classes Is patient taking? Indication by Medication  ?Antipsychotic No   ?Anticoagulant No   ?Antibiotic Yes Cefadroxil for wound/cellulitis   ?Opioid No   ?Antiplatelet No   ?Hypoglycemics/insulin No   ?Vasoactive Medication No   ?Chemotherapy No   ?Other Yes Flomax for retention ?Requip for RLS ?Trazodone for sleep  ? ? ? ?Type of Medication Issue Identified Description of Issue Recommendation(s)  ?Drug Interaction(s) (clinically significant) ?    ?Duplicate Therapy ?    ?Allergy ?    ?No Medication Administration End Date ?    ?Incorrect Dose ?    ?Additional Drug Therapy Needed ?    ?Significant med changes from prior encounter (inform family/care partners about these prior to discharge).    ?Other ?    ? ? ?Clinically significant medication issues were identified that warrant physician communication and completion of prescribed/recommended actions by midnight of the next day:  No ? ?Pharmacist comments: None ? ?Time spent performing this drug regimen review (minutes):  20 minutes ? ? ?Tad Moore ?07/23/2021 7:21 AM ?

## 2021-07-23 NOTE — Progress Notes (Signed)
?                                                       PROGRESS NOTE ? ? ?Subjective/Complaints: ? ?Pt reports will have difficulty getting home today- wife is asking for ambulance to get him home.  ?Daughter called upset that pt's wife will try to do for him and so cannot- they are concerned they will go home together- wants pt to go to SNF- ?Explained he's at supervision/mod I and is too high level for SNF- will d/w SW- will see if can arrange transport and d/c tomorrow.  ? ? ?ROS:  ? ?Pt denies SOB, abd pain, CP, N/V/C/D, and vision changes ? ?Objective: ?  ?No results found. ?Recent Labs  ?  07/23/21 ?0719  ?WBC 5.7  ?HGB 13.5  ?HCT 40.4  ?PLT 411*  ? ? ?Recent Labs  ?  07/23/21 ?0719  ?NA 139  ?K 4.8  ?CL 104  ?CO2 28  ?GLUCOSE 124*  ?BUN 15  ?CREATININE 0.97  ?CALCIUM 9.4  ? ? ? ?Intake/Output Summary (Last 24 hours) at 07/23/2021 1924 ?Last data filed at 07/23/2021 1700 ?Gross per 24 hour  ?Intake 715 ml  ?Output 500 ml  ?Net 215 ml  ?  ? ?  ? ?Physical Exam: ?Vital Signs ?Blood pressure 131/68, pulse 81, temperature 98.7 ?F (37.1 ?C), resp. rate 17, height 6' (1.829 m), weight 78 kg, SpO2 98 %. ? ? ? ?General: awake, alert, appropriate, sitting up in w/c- wife at bedside; wearing CAM boot; NAD ?HENT: conjugate gaze; oropharynx moist ?CV: regular rate; no JVD ?Pulmonary: CTA B/L; no W/R/R- good air movement ?GI: soft, NT, ND, (+)BS ?Psychiatric: appropriate ?Neurological: Ox3 ? ?Skin; pretibial ecchymoses/reddish bruising? And healing blisters that are popped ?Musculoskeletal: still has some palpable swelling on dorsum of R foot- wiggling toes- same today- ace wrap alittle looser ?   Cervical back: Neck supple. No tenderness.  ?   Comments: Mild achilles pain with ankle DF  ?Toes warm.  ?UE strength 5/5 B/L ?LLE 5/5 except DF which is 5-/5 ?RLE- HF 2/5; KE 2+/5; cannot test distally due to cast  ?Skin: ?   Comments: bruising along right forefoot, some bruising as well LLE ?Mild road rash L forearm, almost  healed and ascalp and R eye look great- sutures look good on R knee/distal thigh ?  ?Neurological:  ?   Mental Status: He is alert and oriented to person, place, and time.  ?   Comments: Intact to light touch in all 4 extremities ? Not moving LLE as much today while I was in room ? ? ?Assessment/Plan: ?1. Functional deficits which require 3+ hours per day of interdisciplinary therapy in a comprehensive inpatient rehab setting. ?Physiatrist is providing close team supervision and 24 hour management of active medical problems listed below. ?Physiatrist and rehab team continue to assess barriers to discharge/monitor patient progress toward functional and medical goals ? ?Care Tool: ? ?Bathing ? Bathing activity did not occur: Safety/medical concerns ?Body parts bathed by patient: Right arm, Left arm, Chest, Abdomen, Right upper leg, Left upper leg, Left lower leg, Face, Front perineal area, Buttocks  ? Body parts bathed by helper: Buttocks, Front perineal area ?Body parts n/a: Right lower leg ?  ?Bathing assist Assist Level: Supervision/Verbal cueing ?  ?  ?Upper  Body Dressing/Undressing ?Upper body dressing Upper body dressing/undressing activity did not occur (including orthotics): Safety/medical concerns ?What is the patient wearing?: Pull over shirt ?   ?Upper body assist Assist Level: Independent ?   ?Lower Body Dressing/Undressing ?Lower body dressing ? ? ? Lower body dressing activity did not occur: Safety/medical concerns ?What is the patient wearing?: Pants ? ?  ? ?Lower body assist Assist for lower body dressing: Supervision/Verbal cueing ?   ? ?Toileting ?Toileting    ?Toileting assist Assist for toileting: Supervision/Verbal cueing ?  ?  ?Transfers ?Chair/bed transfer ? ?Transfers assist ? Chair/bed transfer activity did not occur: Safety/medical concerns ? ?Chair/bed transfer assist level: Supervision/Verbal cueing ?  ?  ?Locomotion ?Ambulation ? ? ?Ambulation assist ? ? Ambulation activity did not occur:  Safety/medical concerns ? ?Assist level: Supervision/Verbal cueing ?Assistive device: Walker-rolling ?Max distance: 77f  ? ?Walk 10 feet activity ? ? ?Assist ?   ? ?Assist level: Supervision/Verbal cueing ?Assistive device: Walker-rolling  ? ?Walk 50 feet activity ? ? ?Assist   ? ?Assist level: Supervision/Verbal cueing ?Assistive device: Walker-rolling  ? ? ?Walk 150 feet activity ? ? ?Assist Walk 150 feet activity did not occur: Safety/medical concerns ? ?  ?  ?  ? ?Walk 10 feet on uneven surface  ?activity ? ? ?Assist Walk 10 feet on uneven surfaces activity did not occur: Safety/medical concerns ? ? ?  ?   ? ?Wheelchair ? ? ? ? ?Assist Is the patient using a wheelchair?: Yes ?Type of Wheelchair: Manual ?  ? ?Wheelchair assist level: Independent ?Max wheelchair distance: 2021f ? ? ?Wheelchair 50 feet with 2 turns activity ? ? ? ?Assist ? ?  ?  ? ? ?Assist Level: Independent  ? ?Wheelchair 150 feet activity  ? ? ? ?Assist ?   ? ? ?Assist Level: Independent  ? ?Blood pressure 131/68, pulse 81, temperature 98.7 ?F (37.1 ?C), resp. rate 17, height 6' (1.829 m), weight 78 kg, SpO2 98 %. ? ?Medical Problem List and Plan: ?1. Functional deficits secondary to fall/polytrauma with R tib/fib fx s/p IM nail- NWB and R orbit fx ?            -patient may  shower if cover RLE cast ?            -ELOS/Goals: 12-14 mod I to supervision ? D/c date 07/23/21 ? Continue CIR- PT, OT - can switch to CAM boot per Ortho PA- will check into suture removal? ? 4/3- on CAM boot now- sutures all out- looks better- d/c from therapy- d/c tomorrow due to transport issues-  ?2.  Antithrombotics: ?-DVT/anticoagulation:  Pharmaceutical: Lovenox X 21 days ?            -antiplatelet therapy:  N/A ?3. Pain Management: Oxycodone prn. ?3/28- doesn't want opiates- will start Lidoderm patches 2- of them on back 8pm to 8a ?3/30- back pain better with lidocaine- con't regimen ?4. Mood/sleep:   ? -continue trazodone PRN only (see #13) ? 3/30- will increase  trazodone to 100 mg QHS for sleep ?            -antipsychotic agents: N/A ?5. Neuropsych: This patient is capable of making decisions on his own behalf. ?6. Skin/Wound Care: Routine pressure relief measures.  ?7. Fluids/Electrolytes/Nutrition: Monitor I/O. Check CMET in am.  ?8. Right tib/fib Fx s/p ORIF: NWB X 6 weeks. Splint X 2 weeks followed by CAM walker ? 3/29- will call Ortho Friday since will be 2 weeks ? 3/30-  can change to boot- is already in room- per Ortho- called due to pt having more RLE pain ? 3/31- xrays look great- changed to CAM walker per Ortho PA ?9. Orbital floor Fx w/o entrapment:  Follow up with Dr. Ellie Lunch 2-3 weeks after discharge.  ?            --no blowing nose. ?10. BPH: Has nocturia X 5 at baseline. Was set for GU eval next week.  ?            --will check PVRs to monitor voiding/retention. ?            --discussed I/O caths for retention/limiting pm fluid intake, Flomax.  ? 3/26 UA negative ?  -encouraged OOB to void, double voids ? 4/3- is a big issue- needs male urinal and wife says she's needed to help with this.  ?11. Mildly displaced sternal Fx: Asymptomatic--pathologic? ?--  PSA on 3/24 was 6.48--f/u per primary team ?12. ABLA: f/u hgb 12.5 stable ?13. RLS: daytime requip causes drowsiness for pt  ?            --was also taking Mg bid at home-->now daily-->Mg level normal ? - pt takes requip at night and mirapex during the day as it makes him less sleepy.  ? -3/26 did better last night with requip 1.'5mg'$  -->continue for sleep/RLS ? 3/27- sleeping OK with trazodone and Requip at night ? 3/29- will make trazodone scheduled 50 mg QHS ? 3/30- increase trazodone to 100 mg QHS ? 4/2 slept ~5h last noc per sleep graph RLS still an issue,  d/c am dose, increased pm dose to '2mg'$   ?14. Constipation: On Miralax BID. Add senna to colace. ?            --Discussed increasing Mg back to bid if levels WNL. ?3/24- will change senokot s to 1 tab senna QHS and stop Miralax BID for now- esp since not  taking pain meds as much.   ?3/28- having daily BM's- sugges to nursing to do on BSC/toilet.  ? 3/30- LBM 3 days ago- will give sorbitol and increase senokot to 2 tabs nightly-  ? 3/31- pt had bowel acciden

## 2021-07-23 NOTE — Progress Notes (Signed)
Pt's daughter Eather Colas called with concerns regarding pt's discharge today. States that pt's wife is not able to provide physical assistance and asked to extend discharge date or to consider SNF placement. Discussed concern with Dr. Dagoberto Ligas. Dr. Dagoberto Ligas contacted daughter and discussed the plan. Pt is to be discharge tomorrow, 4/4 instead.  ? ? ?Gerald Stabs, RN ? ?

## 2021-07-23 NOTE — Progress Notes (Signed)
Patient ID: Edward Waller, male   DOB: Jun 21, 1932, 88 y.o.   MRN: [id number] ? ?SW received updates from medical team, pt family would like to leave tomorrow, consider ambulance transport home, or SNF option. Attendign stated pt can leave tomrorow.  ? ?SW met with tp and pt wife in room who reported they would like to d/c tomorrow to ensure they have adequate support to help get him into the home, and the wife still needs to prepare the home for him. SW shared can set up ambulance transport for d/c if unable to get support, however, likely have a bill as he does not meet ambulance transport criteria.  ? ?SW followed up with pt dtr Edward Waller [phone]) to discuss her request for follow-up as she asked about SNF option. States she and her dtr were discussing, and understand how this is not a likely option. SW shared based on her father's progression he would not be appropriate. SW shared above on what can be done to help with transport home. She states she can pick him up tomorrow afternoon after her appointment. SW shared with pt and wife, and informed medical team.  ? ?Loralee Pacas, MSW, LCSWA ?Office: [phone] ?Cell: [phone] ?Fax: [phone]  ?

## 2021-07-24 MED ORDER — ROPINIROLE HCL 2 MG PO TABS: 2.0000 mg | ORAL_TABLET | Freq: Every day | ORAL | 0 refills | Status: DC

## 2021-07-24 MED ORDER — SENNA 8.6 MG PO TABS: 2.0000 | ORAL_TABLET | Freq: Every day | ORAL | 0 refills | Status: DC

## 2021-07-24 MED ORDER — ENOXAPARIN SODIUM 40 MG/0.4ML IJ SOSY: 40.0000 mg | PREFILLED_SYRINGE | INTRAMUSCULAR | 0 refills | Status: DC

## 2021-07-24 MED ORDER — CEFADROXIL 500 MG PO CAPS: 500.0000 mg | ORAL_CAPSULE | Freq: Two times a day (BID) | ORAL | 0 refills | Status: DC

## 2021-07-24 MED ORDER — TRAZODONE HCL 100 MG PO TABS: 100.0000 mg | ORAL_TABLET | Freq: Every day | ORAL | 0 refills | Status: DC

## 2021-07-24 MED ORDER — ACETAMINOPHEN 325 MG PO TABS: 650.0000 mg | ORAL_TABLET | Freq: Three times a day (TID) | ORAL | Status: DC

## 2021-07-24 MED ORDER — TAMSULOSIN HCL 0.4 MG PO CAPS: 0.4000 mg | ORAL_CAPSULE | Freq: Every day | ORAL | 0 refills | Status: DC

## 2021-07-24 MED ORDER — LIDOCAINE 5 % EX PTCH: 2.0000 | MEDICATED_PATCH | CUTANEOUS | 0 refills | Status: DC

## 2021-07-24 MED ORDER — POLYVINYL ALCOHOL 1.4 % OP SOLN: 1.0000 [drp] | OPHTHALMIC | 0 refills | Status: AC | PRN
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
## 2021-07-24 NOTE — Progress Notes (Signed)
Family at bedside and ready for discharge to home with all personal property and equipment.  Edward Waller providing discharge instructions.  ?Pt reports that he and wife have been educated on Lovenox injections. Family reports an understanding of discharge instructions, follow up appts, and medications.  ?

## 2021-07-24 NOTE — Progress Notes (Signed)
Inpatient Rehabilitation Care Coordinator ?Discharge Note  ? ?Patient Details  ?Name: Edward Waller ?MRN: [id number] ?Date of Birth: [birth year] ? ? ?Discharge location: D/c to home with support from his wife. ? ?Length of Stay: 12 days ? ?Discharge activity level: Supervision ? ?Home/community participation: Limited ? ?Patient response KD:TOIZTI Literacy - How often do you need to have someone help you when you read instructions, pamphlets, or other written material from your doctor or pharmacy?: Never ? ?Patient response WP:YKDXIP Isolation - How often do you feel lonely or isolated from those around you?: Never ? ?Services provided included: MD, RD, PT, OT, RN, TR, Pharmacy, Neuropsych, SW, CM ? ?Financial Services:  ?Charity fundraiser Utilized: Private Insurance ?[employer] ? ?Choices offered to/list presented to: Yes ? ?Follow-up services arranged:  ?Home Health, DME ?Home Health Agency: [facility] for HHPT/OT/aide  ?  ?DME : [employer]- w/c and RW ? ? ?Patient response to transportation need: ?Is the patient able to respond to transportation needs?: Yes ?In the past 12 months, has lack of transportation kept you from medical appointments or from getting medications?: No ?In the past 12 months, has lack of transportation kept you from meetings, work, or from getting things needed for daily living?: No ? ?Comments (or additional information): ? ?Patient/Family verbalized understanding of follow-up arrangements:  Yes ? ?Individual responsible for coordination of the follow-up plan: contact pt or pt wife ? ?Confirmed correct DME delivered: [name] 07/24/2021   ? ?[name] ?

## 2021-07-25 DIAGNOSIS — L539 Erythematous condition, unspecified: Secondary | ICD-10-CM
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
## 2021-08-02 ENCOUNTER — Telehealth: Payer: Self-pay

## 2021-08-02 NOTE — Telephone Encounter (Signed)
Lidocaine patches were denied by insurance ?

## 2021-10-03 ENCOUNTER — Other Ambulatory Visit: Payer: Self-pay | Admitting: Orthopedic Surgery

## 2021-10-03 ENCOUNTER — Ambulatory Visit
Admission: RE | Admit: 2021-10-03 | Discharge: 2021-10-03 | Disposition: A | Discharge status: Home / Self Care | Payer: No Typology Code available for payment source | Source: Ambulatory Visit | Attending: Orthopedic Surgery | Admitting: Orthopedic Surgery

## 2021-10-03 DIAGNOSIS — M79604 Pain in right leg: Secondary | ICD-10-CM

## 2022-03-05 NOTE — H&P (Signed)
TOTAL HIP ADMISSION H&P  Patient is admitted for right total hip arthroplasty.  Subjective:  Chief Complaint: Right hip pain  HPI: Edward Waller, 88 y.o. male, has a history of pain and functional disability in the right hip due to arthritis and patient has failed non-surgical conservative treatments for greater than 12 weeks to include NSAID's and/or analgesics, corticosteriod injections, flexibility and strengthening excercises, and activity modification. Onset of symptoms was gradual, starting  several  years ago with gradually worsening course since that time. The patient noted no past surgery on the right hip. Patient currently rates pain in the right hip at 7 out of 10 with activity. Patient has night pain, worsening of pain with activity and weight bearing, trendelenberg gait, and pain that interfers with activities of daily living. 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 This condition presents safety issues increasing the risk of falls. There is no current active infection.  Patient Active Problem List   Diagnosis Date Noted   Erythema of wound 07/25/2021   Trauma 07/12/2021   Leukocytosis 07/07/2021   Right tibial and fibular fracture 07/06/2021   Sternal fracture 07/06/2021   Orbital fracture (HCC) 07/06/2021   Elevated blood pressure reading 07/06/2021   Encounter to establish care 05/16/2020   Hypothyroidism 05/16/2020   BPH (benign prostatic hyperplasia) 05/16/2020   Closed left ankle fracture 05/16/2020   Restless legs 05/16/2020   Mass of right lower leg 05/16/2020    Past Medical History:  Diagnosis Date   Cataract    bilateral   Fx ankle    left; Dec 2021   Hip pain, chronic, right    Liver cyst    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    Renal cyst, left    Restless leg syndrome    Spinal stenosis    Thyroid disease     Past Surgical History:   Procedure Laterality Date   BACK SURGERY     TIBIA IM NAIL INSERTION Right 07/06/2021   Procedure: INTRAMEDULLARY (IM) NAIL TIBIAL;  Surgeon: Altamese Tarrant, MD;  Location: [place];  Service: Orthopedics;  Laterality: Right;    Prior to Admission medications   Medication Sig Start Date End Date Taking? Authorizing Provider  acetaminophen (TYLENOL) 325 MG tablet Take 2 tablets (650 mg total) by mouth 4 (four) times daily -  with meals and at bedtime. 07/24/21   Love, Ivan Anchors, PA-C  Ascorbic Acid (VITAMIN C PO) Take 1 tablet by mouth every morning.    [provider]  cefadroxil (DURICEF) 500 MG capsule Take 1 capsule (500 mg total) by mouth 2 (two) times daily. 07/24/21   Love, Ivan Anchors, PA-C  Cholecalciferol (VITAMIN D3 PO) Take 1 tablet by mouth at bedtime.    [provider]  enoxaparin (LOVENOX) 40 MG/0.4ML injection Inject 0.4 mLs (40 mg total) into the skin daily for 7 days. 07/24/21 07/31/21  Love, Ivan Anchors, PA-C  Flaxseed, Linseed, (FLAX SEED OIL PO) Take 1 capsule by mouth at bedtime.    [provider]  levothyroxine (SYNTHROID) 50 MCG tablet Take 50 mcg by mouth daily before breakfast.    [provider]  lidocaine (LIDODERM) 5 % Place 2 patches onto the skin daily. Remove & Discard patch within 12 hours or as directed by MD 07/24/21   Love, Ivan Anchors, PA-C  Magnesium Oxide 420 (252 Mg) MG TABS Take 420 mg by mouth  every morning.    [provider]  Multiple Vitamin (MULTIVITAMIN WITH MINERALS) TABS tablet Take 1 tablet by mouth every morning.    [provider]  polyvinyl alcohol (LIQUIFILM TEARS) 1.4 % ophthalmic solution Place 1 drop into the right eye as needed for dry eyes (put at bedside for pt). 07/24/21   Love, Ivan Anchors, PA-C  pramipexole (MIRAPEX) 0.125 MG tablet Take 0.125 mg by mouth at bedtime.    [provider]  rOPINIRole (REQUIP) 2 MG tablet Take 1 tablet (2 mg total) by mouth at bedtime. 07/24/21   Love, Ivan Anchors, PA-C   senna (SENOKOT) 8.6 MG TABS tablet Take 2 tablets (17.2 mg total) by mouth at bedtime. 07/24/21   Bary Leriche, PA-C  sharps container 1 each by Does not apply route as needed. 07/07/21   Ainsley Spinner, PA-C  tamsulosin (FLOMAX) 0.4 MG CAPS capsule Take 1 capsule (0.4 mg total) by mouth daily after supper. 07/24/21   Love, Ivan Anchors, PA-C  traZODone (DESYREL) 100 MG tablet Take 1 tablet (100 mg total) by mouth at bedtime. 07/24/21   Love, Ivan Anchors, PA-C    No Known Allergies  Social History   Socioeconomic History   Marital status: Married    Spouse name: Not on file   Number of children: Not on file   Years of education: Not on file   Highest education level: Not on file  Occupational History   Not on file  Tobacco Use   Smoking status: Never   Smokeless tobacco: Never  Vaping Use   Vaping Use: Never used  Substance and Sexual Activity   Alcohol use: No   Drug use: Never   Sexual activity: Not on file  Other Topics Concern   Not on file  Social History Narrative   Not on file   Social Determinants of Health   Financial Resource Strain: Not on file  Food Insecurity: Not on file  Transportation Needs: Not on file  Physical Activity: Not on file  Stress: Not on file  Social Connections: Not on file  Intimate Partner Violence: Not on file    Tobacco Use: Low Risk  (07/12/2021)   Patient History    Smoking Tobacco Use: Never    Smokeless Tobacco Use: Never    Passive Exposure: Not on file   Social History   Substance and Sexual Activity  Alcohol Use No    No family history on file.  Review of Systems  Constitutional:  Negative for chills and fever.  HENT: Negative.    Eyes: Negative.   Respiratory:  Negative for cough and shortness of breath.   Cardiovascular:  Negative for chest pain and palpitations.  Gastrointestinal:  Negative for abdominal pain, constipation, diarrhea, nausea and vomiting.  Genitourinary:  Negative for dysuria, frequency and urgency.   Musculoskeletal:  Positive for joint pain.  Skin:  Negative for rash.   Objective:  Physical Exam: Well nourished and well developed.  General: Alert and oriented x3, cooperative and pleasant, no acute distress.  Head: normocephalic, atraumatic, neck supple.  Eyes: EOMI. Abdomen: non-tender to palpation and soft, normoactive bowel sounds. Musculoskeletal: The patient has an antalgic gait pattern favoring the right side.   Right Hip Exam:  The range of motion: Flexion to 100 degrees, Internal Rotation to 0 degrees, External Rotation to 0 degrees, and abduction to 10 to 20 degrees without discomfort.  There is no tenderness over the greater trochanteric bursa.  Well-healed scar on the right  knee from recent tibial nail on the right.   Left Hip Exam:  The range of motion: Flexion to 120 degrees, Internal Rotation to 10 degrees, External Rotation to 30 degrees, and abduction to 30 degrees without discomfort.  There is no tenderness over the greater trochanteric bursa.  Calves soft and nontender. Motor function intact in LE. Strength 5/5 LE bilaterally. Neuro: Distal pulses 2+. Sensation to light touch intact in LE.  Vital signs in last 24 hours: BP: ()/()  Arterial Line BP: ()/()   Imaging Review Plain radiographs demonstrate severe degenerative joint disease of the right hip. The bone quality appears to be adequate for age and reported activity level.  Assessment/Plan:  End stage arthritis, right hip  The patient history, physical examination, clinical judgement of the provider and imaging studies are consistent with end stage degenerative joint disease of the right hip and total hip arthroplasty is deemed medically necessary. The treatment options including medical management, injection therapy, arthroscopy and arthroplasty were discussed at length. The risks and benefits of total hip arthroplasty were presented and reviewed. The risks due to aseptic loosening, infection,  stiffness, dislocation/subluxation, thromboembolic complications and other imponderables were discussed. The patient acknowledged the explanation, agreed to proceed with the plan and consent was signed. Patient is being admitted for inpatient treatment for surgery, pain control, PT, OT, prophylactic antibiotics, VTE prophylaxis, progressive ambulation and ADLs and discharge planning.The patient is planning to be discharged  home .  Therapy Plans: HEP Disposition: Home with Wife Planned DVT Prophylaxis: Aspirin 325 mg BID DME Needed: None PCP: Landry Mellow, MD (contacting for clearance) TXA: IV Allergies: NKDA Anesthesia Concerns: None BMI: 22.8 Last HgbA1c: not diabetic  Pharmacy: [facility]  Other: -Given clearance form to take to PCP. -Hx of right tib/fib fx in March 2023  - Patient was instructed on what medications to stop prior to surgery. - Follow-up visit in 2 weeks with Dr. Wynelle Link - Begin physical therapy following surgery - Pre-operative lab work as pre-surgical testing - Prescriptions will be provided in hospital at time of discharge  R. Jaynie Bream, PA-C Orthopedic Surgery [facility] [place]

## 2022-03-10 NOTE — Patient Instructions (Signed)
SURGICAL WAITING ROOM VISITATION Patients having surgery or a procedure may have no more than 2 support people in the waiting area - these visitors may rotate in the visitor waiting room.   Children under the age of 14 must have an adult with them who is not the patient. If the patient needs to stay at the hospital during part of their recovery, the visitor guidelines for inpatient rooms apply.  PRE-OP VISITATION  Pre-op nurse will coordinate an appropriate time for 1 support person to accompany the patient in pre-op.  This support person may not rotate.  This visitor will be contacted when the time is appropriate for the visitor to come back in the pre-op area.  Please refer to the [facility] website for the visitor guidelines for Inpatients (after your surgery is over and you are in a regular room).  You are not required to quarantine at this time prior to your surgery. However, you must do this: Hand Hygiene often Do NOT share personal items Notify your provider if you are in close contact with someone who has COVID or you develop fever 100.4 or greater, new onset of sneezing, cough, sore throat, shortness of breath or body aches.  If you test positive for Covid or have been in contact with anyone that has tested positive in the last 10 days please notify you surgeon.    Your procedure is scheduled on:  Wednesday   March 20, 2022  Report to [facility] Main Entrance: [address] entrance where the [employer] is available.   Report to admitting at:10:00 AM  +++++Call this number if you have any questions or problems the morning of surgery [phone]  Do not eat food after Midnight the night prior to your surgery/procedure.  After Midnight you may have the following liquids until  09:30 AM DAY OF SURGERY  Clear Liquid Diet Water Black Coffee (sugar ok, NO MILK/CREAM OR CREAMERS)  Tea (sugar ok, NO MILK/CREAM OR CREAMERS) regular and decaf                              Plain Jell-O  with no fruit (NO RED)                                           Fruit ices (not with fruit pulp, NO RED)                                     Popsicles (NO RED)                                                                  Juice: apple, WHITE grape, WHITE cranberry Sports drinks like Gatorade or Powerade (NO RED)                    The day of surgery:  Drink ONE (1) Pre-Surgery Clear Ensure  at  09:30 AM the morning of surgery. Drink in one sitting. Do not sip.  This drink was given to you  during your hospital pre-op appointment visit. Nothing else to drink after completing the Pre-Surgery Clear Ensure: No candy, chewing gum or throat lozenges.    FOLLOW ANY ADDITIONAL PRE OP INSTRUCTIONS YOU RECEIVED FROM YOUR SURGEON'S OFFICE!!!   Oral Hygiene is also important to reduce your risk of infection.        Remember - BRUSH YOUR TEETH THE MORNING OF SURGERY WITH YOUR REGULAR TOOTHPASTE  Take ONLY these medicines the morning of surgery with A SIP OF WATER: levothyroxine (Synthroid)   You may not have any metal on your body including jewelry, and body piercing  Do not wear lotions, powders, cologne, or deodorant  Men may shave face and neck.  Contacts, Hearing Aids, dentures or bridgework may not be worn into surgery.   You may bring a small overnight bag with you on the day of surgery, only pack items that are not valuable .[facility] IS NOT RESPONSIBLE   FOR VALUABLES THAT ARE LOST OR STOLEN.   Do not bring your home medications to the hospital. The Pharmacy will dispense medications listed on your medication list to you during your admission in the Hospital.  Special Instructions: Bring a copy of your healthcare power of attorney and living will documents the day of surgery, if you wish to have them scanned into your [facility] Records- EPIC  Please read over the following fact sheets you were given: IF YOU HAVE QUESTIONS ABOUT YOUR PRE-OP INSTRUCTIONS,  PLEASE CALL [phone]  ([place])   [place] - Preparing for Surgery Before surgery, you can play an important role.  Because skin is not sterile, your skin needs to be as free of germs as possible.  You can reduce the number of germs on your skin by washing with CHG (chlorahexidine gluconate) soap before surgery.  CHG is an antiseptic cleaner which kills germs and bonds with the skin to continue killing germs even after washing. Please DO NOT use if you have an allergy to CHG or antibacterial soaps.  If your skin becomes reddened/irritated stop using the CHG and inform your nurse when you arrive at Short Stay. Do not shave (including legs and underarms) for at least 48 hours prior to the first CHG shower.  You may shave your face/neck.  Please follow these instructions carefully:  1.  Shower with CHG Soap the night before surgery and the  morning of surgery.  2.  If you choose to wash your hair, wash your hair first as usual with your normal  shampoo.  3.  After you shampoo, rinse your hair and body thoroughly to remove the shampoo.                             4.  Use CHG as you would any other liquid soap.  You can apply chg directly to the skin and wash.  Gently with a scrungie or clean washcloth.  5.  Apply the CHG Soap to your body ONLY FROM THE NECK DOWN.   Do not use on face/ open                           Wound or open sores. Avoid contact with eyes, ears mouth and genitals (private parts).                       Wash face,  Genitals (private parts) with your normal  soap.             6.  Wash thoroughly, paying special attention to the area where your  surgery  will be performed.  7.  Thoroughly rinse your body with warm water from the neck down.  8.  DO NOT shower/wash with your normal soap after using and rinsing off the CHG Soap.            9.  Pat yourself dry with a clean towel.            10.  Wear clean pajamas.            11.  Place clean sheets on your bed the night of your  first shower and do not  sleep with pets.  ON THE DAY OF SURGERY : Do not apply any lotions/deodorants the morning of surgery.  Please wear clean clothes to the hospital/surgery center.    FAILURE TO FOLLOW THESE INSTRUCTIONS MAY RESULT IN THE CANCELLATION OF YOUR SURGERY  PATIENT SIGNATURE_________________________________  NURSE SIGNATURE__________________________________  ________________________________________________________________________         Adam Phenix    An incentive spirometer is a tool that can help keep your lungs clear and active. This tool measures how well you are filling your lungs with each breath. Taking long deep breaths may help reverse or decrease the chance of developing breathing (pulmonary) problems (especially infection) following: A long period of time when you are unable to move or be active. BEFORE THE PROCEDURE  If the spirometer includes an indicator to show your best effort, your nurse or respiratory therapist will set it to a desired goal. If possible, sit up straight or lean slightly forward. Try not to slouch. Hold the incentive spirometer in an upright position. INSTRUCTIONS FOR USE  Sit on the edge of your bed if possible, or sit up as far as you can in bed or on a chair. Hold the incentive spirometer in an upright position. Breathe out normally. Place the mouthpiece in your mouth and seal your lips tightly around it. Breathe in slowly and as deeply as possible, raising the piston or the ball toward the top of the column. Hold your breath for 3-5 seconds or for as long as possible. Allow the piston or ball to fall to the bottom of the column. Remove the mouthpiece from your mouth and breathe out normally. Rest for a few seconds and repeat Steps 1 through 7 at least 10 times every 1-2 hours when you are awake. Take your time and take a few normal breaths between deep breaths. The spirometer may include an indicator to show your  best effort. Use the indicator as a goal to work toward during each repetition. After each set of 10 deep breaths, practice coughing to be sure your lungs are clear. If you have an incision (the cut made at the time of surgery), support your incision when coughing by placing a pillow or rolled up towels firmly against it. Once you are able to get out of bed, walk around indoors and cough well. You may stop using the incentive spirometer when instructed by your caregiver.  RISKS AND COMPLICATIONS Take your time so you do not get dizzy or light-headed. If you are in pain, you may need to take or ask for pain medication before doing incentive spirometry. It is harder to take a deep breath if you are having pain. AFTER USE Rest and breathe slowly and easily. It can be helpful to keep  track of a log of your progress. Your caregiver can provide you with a simple table to help with this. If you are using the spirometer at home, follow these instructions: [place] IF:  You are having difficultly using the spirometer. You have trouble using the spirometer as often as instructed. Your pain medication is not giving enough relief while using the spirometer. You develop fever of 100.5 F (38.1 C) or higher.                                                                                                    SEEK IMMEDIATE MEDICAL CARE IF:  You cough up bloody sputum that had not been present before. You develop fever of 102 F (38.9 C) or greater. You develop worsening pain at or near the incision site. MAKE SURE YOU:  Understand these instructions. Will watch your condition. Will get help right away if you are not doing well or get worse. Document Released: 08/19/2006 Document Revised: 07/01/2011 Document Reviewed: 10/20/2006 [facility] Patient Information 2014 [place], [place].

## 2022-03-10 NOTE — Progress Notes (Signed)
COVID Vaccine received:  _0  No _1  Yes Date of any COVID positive Test in last 90 days:  PCP - Andy Gauss, NP,   Landry Mellow, MD at [facility] Cardiologist -   Chest x-ray - 03-06-22 2v  done at [facility] EKG -  07-06-2021   Stress Test -  ECHO -  Cardiac Cath -   PCR screen: _2  Ordered & Completed                      _3   No Order but Needs PROFEND                      _4   N/A for this surgery  Surgery Plan:  _5  Ambulatory                            _6  Outpatient in bed                            _7  Admit  Anesthesia:    _8  General  _9  Spinal                           _10   Choice _11   MAC   Pacemaker / ICD device _12  No _13  Yes        Device order form faxed _14  No    _15   Yes      Faxed to:  Spinal Cord Stimulator:_16  No _17  Yes      (Remind patient to bring remote DOS) Other Implants:   History of Sleep Apnea? _18  No _19  Yes   CPAP used?- _20  No _21  Yes    Does the patient monitor blood sugar? _22  No _23  Yes  _24  N/A Does patient have a [employer] or Dexacom? _25  No _26  Yes   Fasting Blood Sugar Ranges-  Checks Blood Sugar _____ times a day  Last dose of GLP1 agonist-  GLP1 instructions:  Last dose of SGLT-2 inhibitors-  SGLT-2 instructions:   Blood Thinner / Instructions: none Aspirin Instructions:  ERAS Protocol Ordered: _27  No  _28  Yes PRE-SURGERY _29  ENSURE  _30  G2  _31  No Drink Ordered  Patient is to be NPO after: 09:30 am  Comments: Patient is seen at the [place] in [place]. He had blood work on 03-06-22 (CE) which is on the chart.  Activity level: Patient can / can not climb a flight of stairs without difficulty; _32  No CP  _33  No SOB, but would have ______   Patient can / can not perform ADLs without assistance.   Anesthesia review:   Patient denies shortness of breath, fever, cough and chest pain at PAT appointment.  Patient verbalized understanding and agreement to the Pre-Surgical Instructions that were given to them at this PAT  appointment. Patient was also educated of the need to review these PAT instructions again prior to his/her surgery.I reviewed the appropriate phone numbers to call if they have any and questions or concerns.

## 2022-03-12 ENCOUNTER — Encounter (HOSPITAL_COMMUNITY)
Admission: RE | Admit: 2022-03-12 | Discharge: 2022-03-12 | Disposition: A | Discharge status: Home / Self Care | Payer: No Typology Code available for payment source | Source: Ambulatory Visit | Attending: Orthopedic Surgery | Admitting: Orthopedic Surgery

## 2022-03-12 ENCOUNTER — Encounter (HOSPITAL_COMMUNITY): Payer: Self-pay

## 2022-03-12 ENCOUNTER — Other Ambulatory Visit: Payer: Self-pay

## 2022-03-12 VITALS — BP 146/76 | HR 66 | Temp 97.9°F | Resp 16 | Ht 72.0 in | Wt 162.0 lb

## 2022-03-12 DIAGNOSIS — Z01812 Encounter for preprocedural laboratory examination: Secondary | ICD-10-CM | POA: Diagnosis present

## 2022-03-12 DIAGNOSIS — Z01818 Encounter for other preprocedural examination: Secondary | ICD-10-CM

## 2022-03-12 HISTORY — DX: Malignant (primary) neoplasm, unspecified: C80.1

## 2022-03-12 HISTORY — DX: Personal history of urinary calculi: Z87.442

## 2022-03-12 HISTORY — DX: Unspecified osteoarthritis, unspecified site: M19.90

## 2022-03-12 HISTORY — DX: Hypothyroidism, unspecified: E03.9

## 2022-03-12 LAB — SURGICAL PCR SCREEN
MRSA, PCR: NEGATIVE
Staphylococcus aureus: NEGATIVE

## 2022-03-20 ENCOUNTER — Encounter (HOSPITAL_COMMUNITY)
Admission: RE | Disposition: A | Discharge status: Home / Self Care | Payer: Self-pay | Source: Ambulatory Visit | Attending: Orthopedic Surgery

## 2022-03-20 ENCOUNTER — Ambulatory Visit (HOSPITAL_COMMUNITY): Payer: No Typology Code available for payment source

## 2022-03-20 ENCOUNTER — Ambulatory Visit (HOSPITAL_COMMUNITY): Payer: No Typology Code available for payment source | Admitting: Anesthesiology

## 2022-03-20 ENCOUNTER — Other Ambulatory Visit: Payer: Self-pay

## 2022-03-20 ENCOUNTER — Observation Stay (HOSPITAL_COMMUNITY): Payer: No Typology Code available for payment source

## 2022-03-20 ENCOUNTER — Encounter (HOSPITAL_COMMUNITY): Payer: Self-pay | Admitting: Orthopedic Surgery

## 2022-03-20 ENCOUNTER — Ambulatory Visit (HOSPITAL_BASED_OUTPATIENT_CLINIC_OR_DEPARTMENT_OTHER): Payer: No Typology Code available for payment source | Admitting: Anesthesiology

## 2022-03-20 ENCOUNTER — Observation Stay (HOSPITAL_COMMUNITY)
Admission: RE | Admit: 2022-03-20 | Discharge: 2022-03-21 | Disposition: A | Discharge status: Home / Self Care | Payer: No Typology Code available for payment source | Source: Ambulatory Visit | Attending: Orthopedic Surgery | Admitting: Orthopedic Surgery

## 2022-03-20 DIAGNOSIS — E039 Hypothyroidism, unspecified: Secondary | ICD-10-CM

## 2022-03-20 DIAGNOSIS — N289 Disorder of kidney and ureter, unspecified: Secondary | ICD-10-CM

## 2022-03-20 DIAGNOSIS — Z79899 Other long term (current) drug therapy: Secondary | ICD-10-CM | POA: Diagnosis not present

## 2022-03-20 DIAGNOSIS — Z85828 Personal history of other malignant neoplasm of skin: Secondary | ICD-10-CM | POA: Insufficient documentation

## 2022-03-20 DIAGNOSIS — M1611 Unilateral primary osteoarthritis, right hip: Secondary | ICD-10-CM

## 2022-03-20 DIAGNOSIS — M1990 Unspecified osteoarthritis, unspecified site: Secondary | ICD-10-CM | POA: Diagnosis not present

## 2022-03-20 DIAGNOSIS — M169 Osteoarthritis of hip, unspecified: Secondary | ICD-10-CM

## 2022-03-20 HISTORY — PX: TOTAL HIP ARTHROPLASTY: SHX124

## 2022-03-20 LAB — TYPE AND SCREEN
ABO/RH(D): O POS
Antibody Screen: NEGATIVE

## 2022-03-20 SURGERY — ARTHROPLASTY, HIP, TOTAL, ANTERIOR APPROACH
Anesthesia: Spinal | Site: Hip | Laterality: Right

## 2022-03-20 MED ORDER — BISACODYL 10 MG RE SUPP: 10.0000 mg | Freq: Every day | RECTAL | Status: DC | PRN

## 2022-03-20 MED ORDER — ORAL CARE MOUTH RINSE: 15.0000 mL | Freq: Once | OROMUCOSAL | Status: AC

## 2022-03-20 MED ORDER — HYDROCODONE-ACETAMINOPHEN 5-325 MG PO TABS
1.0000 | ORAL_TABLET | ORAL | Status: DC | PRN
  Administered 2022-03-20 – 2022-03-21 (×3): 2 via ORAL
  Filled 2022-03-20 (×3): qty 2

## 2022-03-20 MED ORDER — PROPOFOL 500 MG/50ML IV EMUL
INTRAVENOUS | Status: DC | PRN
  Administered 2022-03-20: 50 ug/kg/min via INTRAVENOUS

## 2022-03-20 MED ORDER — 0.9 % SODIUM CHLORIDE (POUR BTL) OPTIME
TOPICAL | Status: DC | PRN
  Administered 2022-03-20: 1000 mL

## 2022-03-20 MED ORDER — PHENYLEPHRINE HCL-NACL 20-0.9 MG/250ML-% IV SOLN
INTRAVENOUS | Status: DC | PRN
  Administered 2022-03-20: 50 ug/min via INTRAVENOUS

## 2022-03-20 MED ORDER — EPHEDRINE SULFATE-NACL 50-0.9 MG/10ML-% IV SOSY: PREFILLED_SYRINGE | INTRAVENOUS | Status: DC | PRN

## 2022-03-20 MED ORDER — TRANEXAMIC ACID-NACL 1000-0.7 MG/100ML-% IV SOLN
INTRAVENOUS | Status: AC
  Filled 2022-03-20: qty 100

## 2022-03-20 MED ORDER — PROPOFOL 1000 MG/100ML IV EMUL
INTRAVENOUS | Status: AC
  Filled 2022-03-20: qty 100

## 2022-03-20 MED ORDER — TAMSULOSIN HCL 0.4 MG PO CAPS
0.4000 mg | ORAL_CAPSULE | Freq: Every day | ORAL | Status: DC
  Administered 2022-03-20 – 2022-03-21 (×2): 0.4 mg via ORAL
  Filled 2022-03-20 (×2): qty 1

## 2022-03-20 MED ORDER — METOCLOPRAMIDE HCL 5 MG PO TABS: 5.0000 mg | ORAL_TABLET | Freq: Three times a day (TID) | ORAL | Status: DC | PRN

## 2022-03-20 MED ORDER — BUPIVACAINE-EPINEPHRINE (PF) 0.5% -1:200000 IJ SOLN
INTRAMUSCULAR | Status: AC
  Filled 2022-03-20: qty 30

## 2022-03-20 MED ORDER — ONDANSETRON HCL 4 MG/2ML IJ SOLN: 4.0000 mg | Freq: Four times a day (QID) | INTRAMUSCULAR | Status: DC | PRN

## 2022-03-20 MED ORDER — POLYETHYLENE GLYCOL 3350 17 G PO PACK: 17.0000 g | PACK | Freq: Every day | ORAL | Status: DC | PRN

## 2022-03-20 MED ORDER — BUPIVACAINE-EPINEPHRINE 0.5% -1:200000 IJ SOLN
INTRAMUSCULAR | Status: DC | PRN
  Administered 2022-03-20: 30 mL

## 2022-03-20 MED ORDER — SODIUM CHLORIDE 0.9 % IV SOLN: INTRAVENOUS | Status: DC

## 2022-03-20 MED ORDER — CEFAZOLIN SODIUM-DEXTROSE 2-4 GM/100ML-% IV SOLN
2.0000 g | INTRAVENOUS | Status: AC
  Administered 2022-03-20: 2 g via INTRAVENOUS

## 2022-03-20 MED ORDER — DOCUSATE SODIUM 100 MG PO CAPS
100.0000 mg | ORAL_CAPSULE | Freq: Two times a day (BID) | ORAL | Status: DC
  Administered 2022-03-20 – 2022-03-21 (×2): 100 mg via ORAL
  Filled 2022-03-20 (×2): qty 1

## 2022-03-20 MED ORDER — METHOCARBAMOL 500 MG PO TABS
500.0000 mg | ORAL_TABLET | Freq: Four times a day (QID) | ORAL | Status: DC | PRN
  Administered 2022-03-20 – 2022-03-21 (×2): 500 mg via ORAL
  Filled 2022-03-20 (×2): qty 1

## 2022-03-20 MED ORDER — PHENYLEPHRINE HCL (PRESSORS) 10 MG/ML IV SOLN
INTRAVENOUS | Status: AC
  Filled 2022-03-20: qty 1

## 2022-03-20 MED ORDER — ONDANSETRON HCL 4 MG PO TABS: 4.0000 mg | ORAL_TABLET | Freq: Four times a day (QID) | ORAL | Status: DC | PRN

## 2022-03-20 MED ORDER — CEFAZOLIN SODIUM-DEXTROSE 2-4 GM/100ML-% IV SOLN
2.0000 g | Freq: Four times a day (QID) | INTRAVENOUS | Status: AC
  Administered 2022-03-20 (×2): 2 g via INTRAVENOUS
  Filled 2022-03-20 (×2): qty 100

## 2022-03-20 MED ORDER — TRAMADOL HCL 50 MG PO TABS
50.0000 mg | ORAL_TABLET | Freq: Four times a day (QID) | ORAL | Status: DC | PRN
  Administered 2022-03-20 – 2022-03-21 (×3): 100 mg via ORAL
  Filled 2022-03-20 (×3): qty 2

## 2022-03-20 MED ORDER — POVIDONE-IODINE 10 % EX SWAB
2.0000 | Freq: Once | CUTANEOUS | Status: AC
  Administered 2022-03-20: 2 via TOPICAL

## 2022-03-20 MED ORDER — MENTHOL 3 MG MT LOZG: 1.0000 | LOZENGE | OROMUCOSAL | Status: DC | PRN

## 2022-03-20 MED ORDER — FENTANYL CITRATE (PF) 100 MCG/2ML IJ SOLN
INTRAMUSCULAR | Status: DC | PRN
  Administered 2022-03-20 (×2): 25 ug via INTRAVENOUS

## 2022-03-20 MED ORDER — TRANEXAMIC ACID-NACL 1000-0.7 MG/100ML-% IV SOLN
1000.0000 mg | INTRAVENOUS | Status: AC
  Administered 2022-03-20: 1000 mg via INTRAVENOUS

## 2022-03-20 MED ORDER — FENTANYL CITRATE (PF) 100 MCG/2ML IJ SOLN
INTRAMUSCULAR | Status: AC
  Filled 2022-03-20: qty 2

## 2022-03-20 MED ORDER — PROPOFOL 10 MG/ML IV BOLUS
INTRAVENOUS | Status: DC | PRN
  Administered 2022-03-20: 30 mg via INTRAVENOUS

## 2022-03-20 MED ORDER — CEFAZOLIN SODIUM-DEXTROSE 2-4 GM/100ML-% IV SOLN
INTRAVENOUS | Status: AC
  Filled 2022-03-20: qty 100

## 2022-03-20 MED ORDER — METHOCARBAMOL 500 MG IVPB - SIMPLE MED: 500.0000 mg | Freq: Four times a day (QID) | INTRAVENOUS | Status: DC | PRN

## 2022-03-20 MED ORDER — LACTATED RINGERS IV SOLN: INTRAVENOUS | Status: DC

## 2022-03-20 MED ORDER — ROPINIROLE HCL 1 MG PO TABS: 2.0000 mg | ORAL_TABLET | Freq: Every day | ORAL | Status: DC

## 2022-03-20 MED ORDER — FENTANYL CITRATE PF 50 MCG/ML IJ SOSY: 25.0000 ug | PREFILLED_SYRINGE | INTRAMUSCULAR | Status: DC | PRN

## 2022-03-20 MED ORDER — ACETAMINOPHEN 10 MG/ML IV SOLN
1000.0000 mg | Freq: Four times a day (QID) | INTRAVENOUS | Status: DC
  Administered 2022-03-20: 1000 mg via INTRAVENOUS

## 2022-03-20 MED ORDER — ASPIRIN 325 MG PO TBEC
325.0000 mg | DELAYED_RELEASE_TABLET | Freq: Two times a day (BID) | ORAL | Status: DC
  Administered 2022-03-21: 325 mg via ORAL
  Filled 2022-03-20: qty 1

## 2022-03-20 MED ORDER — ACETAMINOPHEN 325 MG PO TABS: 325.0000 mg | ORAL_TABLET | Freq: Four times a day (QID) | ORAL | Status: DC | PRN

## 2022-03-20 MED ORDER — CHLORHEXIDINE GLUCONATE 0.12 % MT SOLN
15.0000 mL | Freq: Once | OROMUCOSAL | Status: AC
  Administered 2022-03-20: 15 mL via OROMUCOSAL

## 2022-03-20 MED ORDER — DEXAMETHASONE SODIUM PHOSPHATE 10 MG/ML IJ SOLN
8.0000 mg | Freq: Once | INTRAMUSCULAR | Status: AC
  Administered 2022-03-20: 8 mg via INTRAVENOUS

## 2022-03-20 MED ORDER — MORPHINE SULFATE (PF) 2 MG/ML IV SOLN: 0.5000 mg | INTRAVENOUS | Status: DC | PRN

## 2022-03-20 MED ORDER — DEXAMETHASONE SODIUM PHOSPHATE 10 MG/ML IJ SOLN
INTRAMUSCULAR | Status: AC
  Filled 2022-03-20: qty 1

## 2022-03-20 MED ORDER — ORAL CARE MOUTH RINSE: 15.0000 mL | OROMUCOSAL | Status: DC | PRN

## 2022-03-20 MED ORDER — ONDANSETRON HCL 4 MG/2ML IJ SOLN
INTRAMUSCULAR | Status: DC | PRN
  Administered 2022-03-20: 4 mg via INTRAVENOUS

## 2022-03-20 MED ORDER — PHENOL 1.4 % MT LIQD: 1.0000 | OROMUCOSAL | Status: DC | PRN

## 2022-03-20 MED ORDER — ROPINIROLE HCL 1 MG PO TABS
2.0000 mg | ORAL_TABLET | Freq: Every day | ORAL | Status: DC
  Administered 2022-03-20: 2 mg via ORAL
  Filled 2022-03-20: qty 2

## 2022-03-20 MED ORDER — LEVOTHYROXINE SODIUM 50 MCG PO TABS
50.0000 ug | ORAL_TABLET | Freq: Every day | ORAL | Status: DC
  Administered 2022-03-21: 50 ug via ORAL
  Filled 2022-03-20: qty 1

## 2022-03-20 MED ORDER — FLEET ENEMA 7-19 GM/118ML RE ENEM: 1.0000 | ENEMA | Freq: Once | RECTAL | Status: DC | PRN

## 2022-03-20 MED ORDER — ONDANSETRON HCL 4 MG/2ML IJ SOLN
INTRAMUSCULAR | Status: AC
  Filled 2022-03-20: qty 2

## 2022-03-20 MED ORDER — BUPIVACAINE IN DEXTROSE 0.75-8.25 % IT SOLN
INTRATHECAL | Status: DC | PRN
  Administered 2022-03-20: 1.8 mL via INTRATHECAL

## 2022-03-20 MED ORDER — DIPHENHYDRAMINE HCL 12.5 MG/5ML PO ELIX: 12.5000 mg | ORAL_SOLUTION | ORAL | Status: DC | PRN

## 2022-03-20 MED ORDER — DEXAMETHASONE SODIUM PHOSPHATE 10 MG/ML IJ SOLN
10.0000 mg | Freq: Once | INTRAMUSCULAR | Status: AC
  Administered 2022-03-21: 10 mg via INTRAVENOUS
  Filled 2022-03-20: qty 1

## 2022-03-20 MED ORDER — METOCLOPRAMIDE HCL 5 MG/ML IJ SOLN: 5.0000 mg | Freq: Three times a day (TID) | INTRAMUSCULAR | Status: DC | PRN

## 2022-03-20 MED ORDER — WATER FOR IRRIGATION, STERILE IR SOLN
Status: DC | PRN
  Administered 2022-03-20: 2000 mL

## 2022-03-20 MED ORDER — GLYCOPYRROLATE PF 0.2 MG/ML IJ SOSY
PREFILLED_SYRINGE | INTRAMUSCULAR | Status: DC | PRN
  Administered 2022-03-20: .2 mg via INTRAVENOUS

## 2022-03-20 SURGICAL SUPPLY — 37 items
BAG COUNTER SPONGE SURGICOUNT (BAG) IMPLANT
BLADE SAG 18X100X1.27 (BLADE) ×1 IMPLANT
COVER PERINEAL POST (MISCELLANEOUS) ×1 IMPLANT
COVER SURGICAL LIGHT HANDLE (MISCELLANEOUS) ×1 IMPLANT
CUP ACET PINNACLE SECTR 56MM (Hips) IMPLANT
DRAPE FOOT SWITCH (DRAPES) ×1 IMPLANT
DRAPE STERI IOBAN 125X83 (DRAPES) ×1 IMPLANT
DRAPE U-SHAPE 47X51 STRL (DRAPES) ×2 IMPLANT
DRSG AQUACEL AG ADV 3.5X10 (GAUZE/BANDAGES/DRESSINGS) ×1 IMPLANT
DURAPREP 26ML APPLICATOR (WOUND CARE) ×1 IMPLANT
ELECT REM PT RETURN 15FT ADLT (MISCELLANEOUS) ×1 IMPLANT
GLOVE BIO SURGEON STRL SZ 6.5 (GLOVE) IMPLANT
GLOVE BIO SURGEON STRL SZ8 (GLOVE) ×1 IMPLANT
GLOVE BIOGEL PI IND STRL 7.0 (GLOVE) IMPLANT
GLOVE BIOGEL PI IND STRL 8 (GLOVE) ×1 IMPLANT
GOWN STRL REUS W/ TWL LRG LVL3 (GOWN DISPOSABLE) ×1 IMPLANT
GOWN STRL REUS W/TWL LRG LVL3 (GOWN DISPOSABLE) ×2
HEAD M SROM 36MM PLUS 1.5 (Hips) IMPLANT
HOLDER FOLEY CATH W/STRAP (MISCELLANEOUS) ×1 IMPLANT
KIT TURNOVER KIT A (KITS) IMPLANT
LINER MARATHON 4 NEUTRAL 36X56 (Hips) IMPLANT
MANIFOLD NEPTUNE II (INSTRUMENTS) ×1 IMPLANT
PACK ANTERIOR HIP CUSTOM (KITS) ×1 IMPLANT
PENCIL SMOKE EVACUATOR COATED (MISCELLANEOUS) ×1 IMPLANT
PINNACLE SECTOR CUP 56MM (Hips) ×1 IMPLANT
SPIKE FLUID TRANSFER (MISCELLANEOUS) ×1 IMPLANT
SROM M HEAD 36MM PLUS 1.5 (Hips) ×1 IMPLANT
STEM FEM ACTIS HIGH SZ8 (Stem) IMPLANT
STRIP CLOSURE SKIN 1/2X4 (GAUZE/BANDAGES/DRESSINGS) ×1 IMPLANT
SUT ETHIBOND NAB CT1 #1 30IN (SUTURE) ×1 IMPLANT
SUT MNCRL AB 4-0 PS2 18 (SUTURE) ×1 IMPLANT
SUT STRATAFIX 0 PDS 27 VIOLET (SUTURE) ×1
SUT VIC AB 2-0 CT1 27 (SUTURE) ×2
SUT VIC AB 2-0 CT1 TAPERPNT 27 (SUTURE) ×2 IMPLANT
SUTURE STRATFX 0 PDS 27 VIOLET (SUTURE) ×1 IMPLANT
TRAY FOLEY MTR SLVR 16FR STAT (SET/KITS/TRAYS/PACK) ×1 IMPLANT
TUBE SUCTION HIGH CAP CLEAR NV (SUCTIONS) ×1 IMPLANT

## 2022-03-20 NOTE — Anesthesia Preprocedure Evaluation (Addendum)
Anesthesia Evaluation  Patient identified by MRN, date of birth, ID band Patient awake    Reviewed: Allergy & Precautions, NPO status , Patient's Chart, lab work & pertinent test results  Airway Mallampati: II       Dental   Pulmonary neg pulmonary ROS   breath sounds clear to auscultation       Cardiovascular negative cardio ROS  Rhythm:Regular Rate:Normal     Neuro/Psych negative neurological ROS     GI/Hepatic negative GI ROS, Neg liver ROS,,,  Endo/Other  Hypothyroidism    Renal/GU Renal disease     Musculoskeletal  (+) Arthritis ,    Abdominal   Peds  Hematology   Anesthesia Other Findings   Reproductive/Obstetrics                             Anesthesia Physical Anesthesia Plan  ASA: 3  Anesthesia Plan: Spinal   Post-op Pain Management: Tylenol PO (pre-op)*   Induction: Intravenous  PONV Risk Score and Plan: 2 and Ondansetron, Dexamethasone and Midazolam  Airway Management Planned: Nasal Cannula and Simple Face Mask  Additional Equipment:   Intra-op Plan:   Post-operative Plan:   Informed Consent: I have reviewed the patients History and Physical, chart, labs and discussed the procedure including the risks, benefits and alternatives for the proposed anesthesia with the patient or authorized representative who has indicated his/her understanding and acceptance.     Dental advisory given  Plan Discussed with: CRNA and Anesthesiologist  Anesthesia Plan Comments:        Anesthesia Quick Evaluation

## 2022-03-20 NOTE — Op Note (Signed)
OPERATIVE REPORT- TOTAL HIP ARTHROPLASTY   PREOPERATIVE DIAGNOSIS: Osteoarthritis of the Right hip.   POSTOPERATIVE DIAGNOSIS: Osteoarthritis of the Right  hip.   PROCEDURE: Right total hip arthroplasty, anterior approach.   SURGEON: Gaynelle Arabian, MD   ASSISTANT: Theresa Duty, PA-C  ANESTHESIA:  Spinal  ESTIMATED BLOOD LOSS:-450 mL    DRAINS: None  COMPLICATIONS: None   CONDITION: PACU - hemodynamically stable.   BRIEF CLINICAL NOTE: Edward Waller is a 88 y.o. male who has advanced end-  stage arthritis of their Right  hip with progressively worsening pain and  dysfunction.The patient has failed nonoperative management and presents for  total hip arthroplasty.   PROCEDURE IN DETAIL: After successful administration of spinal  anesthetic, the traction boots for the [facility] bed were placed on both  feet and the patient was placed onto the [facility] bed, boots placed into the leg  holders. The Right hip was then isolated from the perineum with plastic  drapes and prepped and draped in the usual sterile fashion. ASIS and  greater trochanter were marked and a oblique incision was made, starting  at about 1 cm lateral and 2 cm distal to the ASIS and coursing towards  the anterior cortex of the femur. The skin was cut with a 10 blade  through subcutaneous tissue to the level of the fascia overlying the  tensor fascia lata muscle. The fascia was then incised in line with the  incision at the junction of the anterior third and posterior 2/3rd. The  muscle was teased off the fascia and then the interval between the TFL  and the rectus was developed. The Hohmann retractor was then placed at  the top of the femoral neck over the capsule. The vessels overlying the  capsule were cauterized and the fat on top of the capsule was removed.  A Hohmann retractor was then placed anterior underneath the rectus  femoris to give exposure to the entire anterior capsule. A T-shaped   capsulotomy was performed. The edges were tagged and the femoral head  was identified.       Osteophytes are removed off the superior acetabulum.  The femoral neck was then cut in situ with an oscillating saw. Traction  was then applied to the left lower extremity utilizing the [facility]  traction. The femoral head was then removed. Retractors were placed  around the acetabulum and then circumferential removal of the labrum was  performed. Osteophytes were also removed. Reaming starts at 51 mm to  medialize and  Increased in 2 mm increments to 55 mm. We reamed in  approximately 40 degrees of abduction, 20 degrees anteversion. A 56 mm  pinnacle acetabular shell was then impacted in anatomic position under  fluoroscopic guidance with excellent purchase. We did not need to place  any additional dome screws. A 36 mm neutral + 4 marathon liner was then  placed into the acetabular shell.       The femoral lift was then placed along the lateral aspect of the femur  just distal to the vastus ridge. The leg was  externally rotated and capsule  was stripped off the inferior aspect of the femoral neck down to the  level of the lesser trochanter, this was done with electrocautery. The femur was lifted after this was performed. The  leg was then placed in an extended and adducted position essentially delivering the femur. We also removed the capsule superiorly and the piriformis from the piriformis fossa to  gain excellent exposure of the  proximal femur. Rongeur was used to remove some cancellous bone to get  into the lateral portion of the proximal femur for placement of the  initial starter reamer. The starter broaches was placed  the starter broach  and was shown to go down the center of the canal. Broaching  with the Actis system was then performed starting at size 0  coursing  Up to size 8. A size 8 had excellent torsional and rotational  and axial stability. The trial high offset neck was then placed   with a 36 + 1.5 trial head. The hip was then reduced. We confirmed that  the stem was in the canal both on AP and lateral x-rays. It also has excellent sizing. The hip was reduced with outstanding stability through full extension and full external rotation.. AP pelvis was taken and the leg lengths were measured and found to be equal. Hip was then dislocated again and the femoral head and neck removed. The  femoral broach was removed. Size 8 Actis stem with a high offset  neck was then impacted into the femur following native anteversion. Has  excellent purchase in the canal. Excellent torsional and rotational and  axial stability. It is confirmed to be in the canal on AP and lateral  fluoroscopic views. The 36 + 1.5 ceramic head was placed and the hip  reduced with outstanding stability. Again AP pelvis was taken and it  confirmed that the leg lengths were equal. The wound was then copiously  irrigated with saline solution and the capsule reattached and repaired  with Ethibond suture. 30 ml of .25% Bupivicaine was  injected into the capsule and into the edge of the tensor fascia lata as well as subcutaneous tissue. The fascia overlying the tensor fascia lata was then closed with a running #1 V-Loc. Subcu was closed with interrupted 2-0 Vicryl and subcuticular running 4-0 Monocryl. Incision was cleaned  and dried. Steri-Strips and a bulky sterile dressing applied. The patient was awakened and transported to  recovery in stable condition.        Please note that a surgical assistant was a medical necessity for this procedure to perform it in a safe and expeditious manner. Assistant was necessary to provide appropriate retraction of vital neurovascular structures and to prevent femoral fracture and allow for anatomic placement of the prosthesis.  Gaynelle Arabian, M.D.

## 2022-03-20 NOTE — Anesthesia Procedure Notes (Addendum)
Spinal  Patient location during procedure: OR Start time: 03/20/2022 10:35 AM End time: 03/20/2022 10:56 AM Staffing Performed: anesthesiologist  Anesthesiologist: Belinda Block, MD Performed by: Belinda Block, MD Authorized by: Belinda Block, MD   Preanesthetic Checklist Completed: patient identified, IV checked, site marked, risks and benefits discussed, surgical consent, monitors and equipment checked, pre-op evaluation and timeout performed Spinal Block Patient position: sitting Prep: DuraPrep Patient monitoring: heart rate, cardiac monitor, continuous pulse ox and blood pressure Approach: midline Location: L3-4 Injection technique: single-shot Needle Needle type: Quincke, Introducer and Sprotte  Needle gauge: 22 G Assessment Sensory level: T12 Additional Notes Clear CSF. Exp Checked. Pt tolerated procedure well

## 2022-03-20 NOTE — Transfer of Care (Signed)
Immediate Anesthesia Transfer of Care Note  Patient: Edward Waller  Procedure(s) Performed: TOTAL HIP ARTHROPLASTY ANTERIOR APPROACH (Right: Hip)  Patient Location: PACU  Anesthesia Type:Spinal and MAC combined with regional for post-op pain  Level of Consciousness: awake, alert , oriented, and patient cooperative  Airway & Oxygen Therapy: Patient Spontanous Breathing and Patient connected to face mask oxygen  Post-op Assessment: Report given to RN and Post -op Vital signs reviewed and stable  Post vital signs: Reviewed and stable  Last Vitals:  Vitals Value Taken Time  BP 117/80 03/20/22 1241  Temp    Pulse 65 03/20/22 1243  Resp 14 03/20/22 1243  SpO2 100 % 03/20/22 1243  Vitals shown include unvalidated device data.  Last Pain:  Vitals:   03/20/22 0823  TempSrc:   PainSc: 0-No pain         Complications: No notable events documented.

## 2022-03-20 NOTE — Interval H&P Note (Signed)
History and Physical Interval Note:  03/20/2022 9:39 AM  Edward Waller  has presented today for surgery, with the diagnosis of right hip osteoarthritis.  The various methods of treatment have been discussed with the patient and family. After consideration of risks, benefits and other options for treatment, the patient has consented to  Procedure(s): TOTAL HIP ARTHROPLASTY ANTERIOR APPROACH (Right) as a surgical intervention.  The patient's history has been reviewed, patient examined, no change in status, stable for surgery.  I have reviewed the patient's chart and labs.  Questions were answered to the patient's satisfaction.     Pilar Plate Jada Kuhnert

## 2022-03-20 NOTE — Evaluation (Signed)
Physical Therapy Evaluation Patient Details Name: Edward Waller MRN: [id number] DOB: 1932/10/27 Today's Date: 03/20/2022  History of Present Illness  88 yo s/p R DATHA 03/20/2022 with PMH of L anke fracture 2022, orbital fracture 07/06/2021 and R tib/fib ORIF 07/06/2021.  Clinical Impression  Pt is s/p THA resulting in the deficits listed below (see PT Problem List).  Pt will benefit from acute PT to increase their independence and safety with mobility to allow discharge home with wife assisting. Will continue to work with gait progression, exercises, and stair training.         Recommendations for follow up therapy are one component of a multi-disciplinary discharge planning process, led by the attending physician.  Recommendations may be updated based on patient status, additional functional criteria and insurance authorization.  Follow Up Recommendations Follow physician's recommendations for discharge plan and follow up therapies (pt states he has a family memeber who is a PT to help him progress)      Assistance Recommended at Discharge Frequent or constant Supervision/Assistance  Patient can return home with the following  A little help with walking and/or transfers;A little help with bathing/dressing/bathroom;Assist for transportation;Help with stairs or ramp for entrance    Equipment Recommendations Rolling walker (2 wheels)  Recommendations for Other Services       Functional Status Assessment       Precautions / Restrictions Precautions Precautions: None Restrictions Weight Bearing Restrictions: No      Mobility  Bed Mobility Overal bed mobility: Needs Assistance Bed Mobility: Supine to Sit, Sit to Supine     Supine to sit: Min assist, HOB elevated     General bed mobility comments: for assistance with RLE    Transfers Overall transfer level: Needs assistance Equipment used: Rolling walker (2 wheels) Transfers: Sit to/from Stand Sit to Stand: Min guard            General transfer comment: Came up to standing very fast , not very controlled but stats that is how he does it at home. cues for RW use and safety    Ambulation/Gait Ambulation/Gait assistance: Min guard Gait Distance (Feet): 20 Feet Assistive device: Rolling walker (2 wheels) Gait Pattern/deviations: Step-to pattern       General Gait Details: cues for RW safety to stay further back away from front bar to decrease potential LOB issues.  Stairs            Wheelchair Mobility    Modified Rankin (Stroke Patients Only)       Balance Overall balance assessment: Needs assistance Sitting-balance support: Bilateral upper extremity supported, Feet supported Sitting balance-Leahy Scale: Good     Standing balance support: Bilateral upper extremity supported, During functional activity Standing balance-Leahy Scale: Fair                               Pertinent Vitals/Pain Pain Assessment Pain Assessment: 0-10 Pain Score: 3  Pain Location: R hip anterior thigh area Pain Descriptors / Indicators: Sore Pain Intervention(s): Monitored during session, Ice applied, Limited activity within patient's tolerance    Home Living Family/patient expects to be discharged to:: Private residence Living Arrangements: Spouse/significant other Available Help at Discharge: Family;Available 24 hours/day Type of Home: House Home Access: Stairs to enter Entrance Stairs-Rails: None Entrance Stairs-Number of Steps: 1-2   Home Layout: One level Home Equipment: Other (comment);BSC/3in1;Rollator (4 wheels)      Prior Function Prior Level of Function : Needs  assist             Mobility Comments: recetnly pt states he has been using rollator due to pain and inability to move RLE well. Pt's wife is a retured Marine scientist and helps a lot when needed soe small things and driving as well.       Hand Dominance        Extremity/Trunk Assessment        Lower Extremity  Assessment Lower Extremity Assessment: RLE deficits/detail RLE Deficits / Details: limited with mobility due to paina dn sore, however able to do funcational amount of ROM  for hip flexion, knee extension and hip Abduction while supine.       Communication   Communication: No difficulties  Cognition Arousal/Alertness: Awake/alert Behavior During Therapy: WFL for tasks assessed/performed Overall Cognitive Status: Within Functional Limits for tasks assessed                                          General Comments      Exercises Total Joint Exercises Ankle Circles/Pumps: AAROM, Both, Supine Quad Sets: AROM, Supine, Right, 5 reps Heel Slides: AAROM, Supine, Right, 5 reps Hip ABduction/ADduction: AAROM, Supine, Right, 5 reps   Assessment/Plan    PT Assessment Patient needs continued PT services  PT Problem List Decreased strength;Decreased range of motion;Decreased activity tolerance;Decreased mobility       PT Treatment Interventions DME instruction;Therapeutic activities;Therapeutic exercise;Gait training;Stair training;Functional mobility training;Patient/family education    PT Goals (Current goals can be found in the Care Plan section)  Acute Rehab PT Goals Patient Stated Goal: I want to be able to wak without an assistive device PT Goal Formulation: With patient Time For Goal Achievement: 03/28/22 Potential to Achieve Goals: Good    Frequency 7X/week     Co-evaluation               AM-PAC PT "6 Clicks" Mobility  Outcome Measure Help needed turning from your back to your side while in a flat bed without using bedrails?: A Little Help needed moving from lying on your back to sitting on the side of a flat bed without using bedrails?: A Little Help needed moving to and from a bed to a chair (including a wheelchair)?: A Little Help needed standing up from a chair using your arms (e.g., wheelchair or bedside chair)?: A Little Help needed to walk  in hospital room?: A Little Help needed climbing 3-5 steps with a railing? : A Little 6 Click Score: 18    End of Session Equipment Utilized During Treatment: Gait belt Activity Tolerance: Patient tolerated treatment well Patient left: in chair;with call bell/phone within reach;with chair alarm set Nurse Communication: Mobility status PT Visit Diagnosis: Other abnormalities of gait and mobility (R26.89)    Time: 1825-1900 PT Time Calculation (min) (ACUTE ONLY): 35 min   Charges:   PT Evaluation $PT Eval Low Complexity: 1 Low PT Treatments $Gait Training: 8-22 mins        Gatha Mayer, PT, MPT Acute Rehabilitation Services Office: [phone] If a weekend: [facility] w/e pager [phone] 03/20/2022   Clide Dales 03/20/2022, 7:33 PM

## 2022-03-20 NOTE — Discharge Instructions (Signed)
°Frank Aluisio, MD °Total Joint Specialist °[facility] °[address] °[place], [place] [address] °[phone] ° °ANTERIOR APPROACH TOTAL HIP REPLACEMENT POSTOPERATIVE DIRECTIONS ° ° ° ° °Hip Rehabilitation, Guidelines Following Surgery  °The results of a hip operation are greatly improved after range of motion and muscle strengthening exercises. Follow all safety measures which are given to protect your hip. If any of these exercises cause increased pain or swelling in your joint, decrease the amount until you are comfortable again. Then slowly increase the exercises. Call your caregiver if you have problems or questions.  ° °BLOOD CLOT PREVENTION °Take a 325 mg Aspirin two times a day for three weeks following surgery. Then take an 81 mg Aspirin once a day for three weeks. Then discontinue Aspirin. °You may resume your vitamins/supplements upon discharge from the hospital. °Do not take any NSAIDs (Advil, Aleve, Ibuprofen, Meloxicam, etc.) until you have discontinued the 325 mg Aspirin. ° °HOME CARE INSTRUCTIONS  °Remove items at home which could result in a fall. This includes throw rugs or furniture in walking pathways.  °ICE to the affected hip as frequently as 20-30 minutes an hour and then as needed for pain and swelling. Continue to use ice on the hip for pain and swelling from surgery. You may notice swelling that will progress down to the foot and ankle. This is normal after surgery. Elevate the leg when you are not up walking on it.   °Continue to use the breathing machine which will help keep your temperature down.  It is common for your temperature to cycle up and down following surgery, especially at night when you are not up moving around and exerting yourself.  The breathing machine keeps your lungs expanded and your temperature down. ° °DIET °You may resume your previous home diet once your are discharged from the hospital. ° °DRESSING / WOUND CARE / SHOWERING °You have  an adhesive waterproof bandage over the incision. Leave this in place until your first follow-up appointment. Once you remove this you will not need to place another bandage.  °You may begin showering 3 days following surgery, but do not submerge the incision under water. ° °ACTIVITY °For the first 3-5 days, it is important to rest and keep the operative leg elevated. You should, as a general rule, rest for 50 minutes and walk/stretch for 10 minutes per hour. After 5 days, you may slowly increase activity as tolerated.  °Perform the exercises you were provided twice a day for about 15-20 minutes each session. Begin these 2 days following surgery. °Walk with your walker as instructed. Use the walker until you are comfortable transitioning to a cane. Walk with the cane in the opposite hand of the operative leg. You may discontinue the cane once you are comfortable and walking steadily. °Avoid periods of inactivity such as sitting longer than an hour when not asleep. This helps prevent blood clots.  °Do not drive a car for 6 weeks or until released by your surgeon.  °Do not drive while taking narcotics. ° °TED HOSE STOCKINGS °Wear the elastic stockings on both legs for three weeks following surgery during the day. You may remove them at night while sleeping. ° °WEIGHT BEARING °Weight bearing as tolerated with assist device (walker, cane, etc) as directed, use it as long as suggested by your surgeon or therapist, typically at least 4-6 weeks. ° °POSTOPERATIVE CONSTIPATION PROTOCOL °Constipation - defined medically as fewer than three stools per week and severe constipation as   less than one stool per week. ° °One of the most common issues patients have following surgery is constipation.  Even if you have a regular bowel pattern at home, your normal regimen is likely to be disrupted due to multiple reasons following surgery.  Combination of anesthesia, postoperative narcotics, change in appetite and fluid intake all can  affect your bowels.  In order to avoid complications following surgery, here are some recommendations in order to help you during your recovery period. ° °Colace (docusate) - Pick up an over-the-counter form of Colace or another stool softener and take twice a day as long as you are requiring postoperative pain medications.  Take with a full glass of water daily.  If you experience loose stools or diarrhea, hold the colace until you stool forms back up.  If your symptoms do not get better within 1 week or if they get worse, check with your doctor. °Dulcolax (bisacodyl) - Pick up over-the-counter and take as directed by the product packaging as needed to assist with the movement of your bowels.  Take with a full glass of water.  Use this product as needed if not relieved by Colace only.  °MiraLax (polyethylene glycol) - Pick up over-the-counter to have on hand.  MiraLax is a solution that will increase the amount of water in your bowels to assist with bowel movements.  Take as directed and can mix with a glass of water, juice, soda, coffee, or tea.  Take if you go more than two days without a movement.Do not use MiraLax more than once per day. Call your doctor if you are still constipated or irregular after using this medication for 7 days in a row. ° °If you continue to have problems with postoperative constipation, please contact the office for further assistance and recommendations.  If you experience "the worst abdominal pain ever" or develop nausea or vomiting, please contact the office immediatly for further recommendations for treatment. ° °ITCHING ° If you experience itching with your medications, try taking only a single pain pill, or even half a pain pill at a time.  You can also use Benadryl over the counter for itching or also to help with sleep.  ° °MEDICATIONS °See your medication summary on the “After Visit Summary” that the nursing staff will review with you prior to discharge.  You may have some home  medications which will be placed on hold until you complete the course of blood thinner medication.  It is important for you to complete the blood thinner medication as prescribed by your surgeon.  Continue your approved medications as instructed at time of discharge. ° °PRECAUTIONS °If you experience chest pain or shortness of breath - call 911 immediately for transfer to the hospital emergency department.  °If you develop a fever greater that 101 F, purulent drainage from wound, increased redness or drainage from wound, foul odor from the wound/dressing, or calf pain - CONTACT YOUR SURGEON.   °                                                °FOLLOW-UP APPOINTMENTS °Make sure you keep all of your appointments after your operation with your surgeon and caregivers. You should call the office at the above phone number and make an appointment for approximately two weeks after the date of your surgery or on the   date instructed by your surgeon outlined in the "After Visit Summary". ° °RANGE OF MOTION AND STRENGTHENING EXERCISES  °These exercises are designed to help you keep full movement of your hip joint. Follow your caregiver's or physical therapist's instructions. Perform all exercises about fifteen times, three times per day or as directed. Exercise both hips, even if you have had only one joint replacement. These exercises can be done on a training (exercise) mat, on the floor, on a table or on a bed. Use whatever works the best and is most comfortable for you. Use music or television while you are exercising so that the exercises are a pleasant break in your day. This will make your life better with the exercises acting as a break in routine you can look forward to.  °Lying on your back, slowly slide your foot toward your buttocks, raising your knee up off the floor. Then slowly slide your foot back down until your leg is straight again.  °Lying on your back spread your legs as far apart as you can without causing  discomfort.  °Lying on your side, raise your upper leg and foot straight up from the floor as far as is comfortable. Slowly lower the leg and repeat.  °Lying on your back, tighten up the muscle in the front of your thigh (quadriceps muscles). You can do this by keeping your leg straight and trying to raise your heel off the floor. This helps strengthen the largest muscle supporting your knee.  °Lying on your back, tighten up the muscles of your buttocks both with the legs straight and with the knee bent at a comfortable angle while keeping your heel on the floor.  ° °POST-OPERATIVE OPIOID TAPER INSTRUCTIONS: °It is important to wean off of your opioid medication as soon as possible. If you do not need pain medication after your surgery it is ok to stop day one. °Opioids include: °Codeine, Hydrocodone(Norco, Vicodin), Oxycodone(Percocet, oxycontin) and hydromorphone amongst others.  °Long term and even short term use of opiods can cause: °Increased pain response °Dependence °Constipation °Depression °Respiratory depression °And more.  °Withdrawal symptoms can include °Flu like symptoms °Nausea, vomiting °And more °Techniques to manage these symptoms °Hydrate well °Eat regular healthy meals °Stay active °Use relaxation techniques(deep breathing, meditating, yoga) °Do Not substitute Alcohol to help with tapering °If you have been on opioids for less than two weeks and do not have pain than it is ok to stop all together.  °Plan to wean off of opioids °This plan should start within one week post op of your joint replacement. °Maintain the same interval or time between taking each dose and first decrease the dose.  °Cut the total daily intake of opioids by one tablet each day °Next start to increase the time between doses. °The last dose that should be eliminated is the evening dose.  ° °IF YOU ARE TRANSFERRED TO A SKILLED REHAB FACILITY °If the patient is transferred to a skilled rehab facility following release from the  hospital, a list of the current medications will be sent to the facility for the patient to continue.  When discharged from the skilled rehab facility, please have the facility set up the patient's Home Health Physical Therapy prior to being released. Also, the skilled facility will be responsible for providing the patient with their medications at time of release from the facility to include their pain medication, the muscle relaxants, and their blood thinner medication. If the patient is still at the rehab facility   at time of the two week follow up appointment, the skilled rehab facility will also need to assist the patient in arranging follow up appointment in our office and any transportation needs. ° °MAKE SURE YOU:  °Understand these instructions.  °Get help right away if you are not doing well or get worse.  ° ° °DENTAL ANTIBIOTICS: ° °In most cases prophylactic antibiotics for Dental procdeures after total joint surgery are not necessary. ° °Exceptions are as follows: ° °1. History of prior total joint infection ° °2. Severely immunocompromised (Organ Transplant, cancer chemotherapy, Rheumatoid biologic °meds such as Humera) ° °3. Poorly controlled diabetes (A1C &gt; 8.0, blood glucose over 200) ° °If you have one of these conditions, contact your surgeon for an antibiotic prescription, prior to your °dental procedure.  ° ° °Pick up stool softner and laxative for home use following surgery while on pain medications. °Do not submerge incision under water. °Please use good hand washing techniques while changing dressing each day. °May shower starting three days after surgery. °Please use a clean towel to pat the incision dry following showers. °Continue to use ice for pain and swelling after surgery. °Do not use any lotions or creams on the incision until instructed by your surgeon. ° °

## 2022-03-20 NOTE — Anesthesia Postprocedure Evaluation (Signed)
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  Last Vitals:  Vitals:   03/20/22 1600 03/20/22 1638  BP: (!) 153/73 (!) 157/72  Pulse: 89 71  Resp: (!) 21 20  Temp:  (!) 36.3 C  SpO2: 99% 99%    Last Pain:  Vitals:   03/20/22 1638  TempSrc: Oral  PainSc:                  [name]

## 2022-03-21 ENCOUNTER — Encounter (HOSPITAL_COMMUNITY): Payer: Self-pay | Admitting: Orthopedic Surgery

## 2022-03-21 DIAGNOSIS — M1611 Unilateral primary osteoarthritis, right hip: Secondary | ICD-10-CM | POA: Diagnosis not present

## 2022-03-21 LAB — BASIC METABOLIC PANEL
Anion gap: 7 (ref 5–15)
BUN: 18 mg/dL (ref 8–23)
CO2: 25 mmol/L (ref 22–32)
Calcium: 8.7 mg/dL — ABNORMAL LOW (ref 8.9–10.3)
Chloride: 105 mmol/L (ref 98–111)
Creatinine, Ser: 1 mg/dL (ref 0.61–1.24)
GFR, Estimated: >60 mL/min (ref >60)
Glucose, Bld: 161 mg/dL — ABNORMAL HIGH (ref 70–99)
Potassium: 4.2 mmol/L (ref 3.5–5.1)
Sodium: 137 mmol/L (ref 135–145)

## 2022-03-21 LAB — CBC
HCT: 39.4 % (ref 39.0–52.0)
Hemoglobin: 13.1 g/dL (ref 13.0–17.0)
MCH: 32.1 pg (ref 26.0–34.0)
MCHC: 33.2 g/dL (ref 30.0–36.0)
MCV: 96.6 fL (ref 80.0–100.0)
Platelets: 179 10*3/uL (ref 150–400)
RBC: 4.08 MIL/uL — ABNORMAL LOW (ref 4.22–5.81)
RDW: 13.2 % (ref 11.5–15.5)
WBC: 14.5 10*3/uL — ABNORMAL HIGH (ref 4.0–10.5)
nRBC: 0 % (ref 0.0–0.2)

## 2022-03-21 MED ORDER — TRAMADOL HCL 50 MG PO TABS: 50.0000 mg | ORAL_TABLET | Freq: Four times a day (QID) | ORAL | 0 refills | Status: DC | PRN

## 2022-03-21 MED ORDER — HYDROCODONE-ACETAMINOPHEN 5-325 MG PO TABS: 1.0000 | ORAL_TABLET | Freq: Four times a day (QID) | ORAL | 0 refills | Status: DC | PRN

## 2022-03-21 MED ORDER — ASPIRIN 325 MG PO TBEC: 325.0000 mg | DELAYED_RELEASE_TABLET | Freq: Two times a day (BID) | ORAL | 0 refills | Status: AC

## 2022-03-21 MED ORDER — METHOCARBAMOL 500 MG PO TABS: 500.0000 mg | ORAL_TABLET | Freq: Four times a day (QID) | ORAL | 0 refills | Status: DC | PRN

## 2022-03-21 MED ORDER — MAGNESIUM OXIDE -MG SUPPLEMENT 400 (240 MG) MG PO TABS
400.0000 mg | ORAL_TABLET | Freq: Every morning | ORAL | Status: DC
  Administered 2022-03-21: 400 mg via ORAL
  Filled 2022-03-21: qty 1

## 2022-03-21 MED ORDER — ASPIRIN 325 MG PO TBEC: 325.0000 mg | DELAYED_RELEASE_TABLET | Freq: Two times a day (BID) | ORAL | 0 refills | Status: DC

## 2022-03-21 NOTE — Discharge Summary (Signed)
Physician Discharge Summary   Patient ID: CADEL STAIRS MRN: [id number] DOB/AGE: Nov 19, 1932 88 y.o.  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   Weight as of this encounter: 73 kg.  Procedure:  Procedure(s) (LRB): TOTAL HIP ARTHROPLASTY ANTERIOR APPROACH (Right)   Consults: None  HPI: Edward Waller is a 88 y.o. male who has advanced end-  stage arthritis of their Right  hip with progressively worsening pain and dysfunction.The patient has failed nonoperative management and presents for total hip arthroplasty.   Laboratory Data: Admission on 03/20/2022, Discharged on 03/21/2022  Component Date Value Ref Range Status   WBC 03/21/2022 14.5 (H)  4.0 - 10.5 K/uL Final   RBC 03/21/2022 4.08 (L)  4.22 - 5.81 MIL/uL Final   Hemoglobin 03/21/2022 13.1  13.0 - 17.0 g/dL Final   HCT 03/21/2022 39.4  39.0 - 52.0 % Final   MCV 03/21/2022 96.6  80.0 - 100.0 fL Final   MCH 03/21/2022 32.1  26.0 - 34.0 pg Final   MCHC 03/21/2022 33.2  30.0 - 36.0 g/dL Final   RDW 03/21/2022 13.2  11.5 - 15.5 % Final   Platelets 03/21/2022 179  150 - 400 K/uL Final   nRBC 03/21/2022 0.0  0.0 - 0.2 % Final   Performed at [facility], [place] [address] [place], [place] [address]   Sodium  03/21/2022 137  135 - 145 mmol/L Final   Potassium 03/21/2022 4.2  3.5 - 5.1 mmol/L Final   Chloride 03/21/2022 105  98 - 111 mmol/L Final   CO2 03/21/2022 25  22 - 32 mmol/L Final   Glucose, Bld 03/21/2022 161 (H)  70 - 99 mg/dL Final   Glucose reference range applies only to samples taken after fasting for at least 8 hours.   BUN 03/21/2022 18  8 - 23 mg/dL Final   Creatinine, Ser 03/21/2022 1.00  0.61 - 1.24 mg/dL Final   Calcium 03/21/2022 8.7 (L)  8.9 - 10.3 mg/dL Final   GFR, Estimated 03/21/2022 >60  >60 mL/min Final   Comment: (NOTE) Calculated using the CKD-EPI Creatinine Equation (2021)    Anion gap 03/21/2022 7  5 - 15 Final   Performed at [facility], [place] [address] [place], [place] [address]  Hospital Outpatient Visit on 03/12/2022  Component Date Value Ref Range Status   ABO/RH(D) 03/12/2022 O POS   Final   Antibody Screen 03/12/2022 NEG   Final   Sample Expiration 03/12/2022 03/23/2022,2359   Final   Extend sample reason 03/12/2022    Final  Value:NO TRANSFUSIONS OR PREGNANCY IN THE PAST 3 MONTHS Performed at [facility] [address], [place], [place] [address]    MRSA, PCR 03/12/2022 NEGATIVE  NEGATIVE Final   Staphylococcus aureus 03/12/2022 NEGATIVE  NEGATIVE Final   Comment: (NOTE) The Xpert SA Assay (FDA approved for NASAL specimens in patients 59 years of age and older), is one component of a comprehensive surveillance program. It is not intended to diagnose infection nor to guide or monitor treatment. Performed at [facility], [place] [address], [place], [place] [address]      X-Rays:DG Pelvis Portable  Result Date: 03/20/2022 CLINICAL DATA:  Right hip replacement EXAM: PORTABLE PELVIS 1-2 VIEWS COMPARISON:  07/06/2021 FINDINGS: Total hip replacement on the right. Components appear well positioned. No radiographically detectable complication. Osteoarthritis of the left hip as  seen previously IMPRESSION: Good appearance following total hip replacement on the right. Electronically Signed   By: Nelson Chimes M.D.   On: 03/20/2022 13:18   DG HIP PORT UNILAT WITH PELVIS 1V RIGHT  Result Date: 03/20/2022 CLINICAL DATA:  Elective surgery. EXAM: DG HIP (WITH OR WITHOUT PELVIS) 1V PORT RIGHT COMPARISON:  Preoperative radiograph 03/24/2020 FINDINGS: Two fluoroscopic spot views of the pelvis and right hip obtained in the operating room. Right hip arthroplasty is in place. Fluoroscopy time 11 seconds. Dose 1.1047 mGy. IMPRESSION: Intraoperative fluoroscopy during right hip arthroplasty. Electronically Signed   By: Keith Rake M.D.   On: 03/20/2022 12:30   DG C-Arm 1-60 Min-No Report  Result Date: 03/20/2022 Fluoroscopy was utilized by the requesting physician.  No radiographic interpretation.   DG C-Arm 1-60 Min-No Report  Result Date: 03/20/2022 Fluoroscopy was utilized by the requesting physician.  No radiographic interpretation.    EKG: Orders placed or performed during the hospital encounter of 07/06/21   EKG 12-Lead   EKG 12-Lead     Hospital Course: Edward Waller is a 88 y.o. who was admitted to [facility]. They were brought to the operating room on 03/20/2022 and underwent Procedure(s): [place].  Patient tolerated the procedure well and was later transferred to the recovery room and then to the orthopaedic floor for postoperative care. They were given PO and IV analgesics for pain control following their surgery. They were given 24 hours of postoperative antibiotics of  Anti-infectives (From admission, onward)    Start     Dose/Rate Route Frequency Ordered Stop   03/20/22 1700  ceFAZolin (ANCEF) IVPB 2g/100 mL premix        2 g 200 mL/hr over 30 Minutes Intravenous Every 6 hours 03/20/22 1639 03/20/22 2322   03/20/22 0815  ceFAZolin (ANCEF) IVPB 2g/100 mL premix        2 g 200 mL/hr over 30 Minutes Intravenous  On call to O.R. 03/20/22 0800 03/20/22 1113   03/20/22 0806  ceFAZolin (ANCEF) 2-4 GM/100ML-% IVPB       Note to Pharmacy: Otila Back M: cabinet override      03/20/22 0806 03/20/22 1118     and started on DVT prophylaxis in the form of Aspirin.   PT and OT were ordered for total joint protocol. Discharge planning consulted to help with postop disposition and equipment needs.  Patient had a fair night on the evening of surgery. They started to get up OOB with therapy on POD #0. Pt was seen during rounds and was ready to go home pending progress with therapy. He worked with therapy on POD #  1 and was meeting his goals. Pt was discharged to home later that day in stable condition.  Diet: Regular diet Activity: WBAT Follow-up: in 2 weeks Disposition: Home Discharged Condition: stable   Discharge Instructions     Call MD / Call 911   Complete by: As directed    If you experience chest pain or shortness of breath, CALL 911 and be transported to the hospital emergency room.  If you develope a fever above 101 F, pus (white drainage) or increased drainage or redness at the wound, or calf pain, call your surgeon's office.   Change dressing   Complete by: As directed    You have an adhesive waterproof bandage over the incision. Leave this in place until your first follow-up appointment. Once you remove this you will not need to place another bandage.   Constipation Prevention   Complete by: As directed    Drink plenty of fluids.  Prune juice may be helpful.  You may use a stool softener, such as Colace (over the counter) 100 mg twice a day.  Use MiraLax (over the counter) for constipation as needed.   Diet - low sodium heart healthy   Complete by: As directed    Do not sit on low chairs, stoools or toilet seats, as it may be difficult to get up from low surfaces   Complete by: As directed    Driving restrictions   Complete by: As directed    No driving for two weeks   Post-operative  opioid taper instructions:   Complete by: As directed    POST-OPERATIVE OPIOID TAPER INSTRUCTIONS: It is important to wean off of your opioid medication as soon as possible. If you do not need pain medication after your surgery it is ok to stop day one. Opioids include: Codeine, Hydrocodone(Norco, Vicodin), Oxycodone(Percocet, oxycontin) and hydromorphone amongst others.  Long term and even short term use of opiods can cause: Increased pain response Dependence Constipation Depression Respiratory depression And more.  Withdrawal symptoms can include Flu like symptoms Nausea, vomiting And more Techniques to manage these symptoms Hydrate well Eat regular healthy meals Stay active Use relaxation techniques(deep breathing, meditating, yoga) Do Not substitute Alcohol to help with tapering If you have been on opioids for less than two weeks and do not have pain than it is ok to stop all together.  Plan to wean off of opioids This plan should start within one week post op of your joint replacement. Maintain the same interval or time between taking each dose and first decrease the dose.  Cut the total daily intake of opioids by one tablet each day Next start to increase the time between doses. The last dose that should be eliminated is the evening dose.      TED hose   Complete by: As directed    Use stockings (TED hose) for three weeks on both leg(s).  You may remove them at night for sleeping.   Weight bearing as tolerated   Complete by: As directed       Allergies as of 03/21/2022   No Known Allergies      Medication List     STOP taking these medications    cefadroxil 500 MG capsule Commonly known as: DURICEF   enoxaparin 40 MG/0.4ML injection Commonly known as: LOVENOX   lidocaine 5 % Commonly known as: LIDODERM   sharps container       TAKE these medications    acetaminophen 325 MG tablet Commonly known  as: TYLENOL Take 2 tablets (650 mg total) by  mouth 4 (four) times daily -  with meals and at bedtime. What changed:  when to take this reasons to take this   aspirin EC 325 MG tablet Take 1 tablet (325 mg total) by mouth 2 (two) times daily for 20 days. Then take one 81 mg aspirin once a day for three weeks. Then discontinue aspirin.   FLAX SEED OIL PO Take 1 capsule by mouth at bedtime.   HYDROcodone-acetaminophen 5-325 MG tablet Commonly known as: NORCO/VICODIN Take 1-2 tablets by mouth every 6 (six) hours as needed for severe pain.   levothyroxine 50 MCG tablet Commonly known as: SYNTHROID Take 50 mcg by mouth daily before breakfast.   Magnesium Oxide -Mg Supplement 420 (252 Mg) MG Tabs Take 420 mg by mouth every morning.   melatonin 3 MG Tabs tablet Take 3 mg by mouth at bedtime.   methocarbamol 500 MG tablet Commonly known as: ROBAXIN Take 1 tablet (500 mg total) by mouth every 6 (six) hours as needed for muscle spasms.   multivitamin with minerals Tabs tablet Take 1 tablet by mouth every morning.   polyvinyl alcohol 1.4 % ophthalmic solution Commonly known as: LIQUIFILM TEARS Place 1 drop into the right eye as needed for dry eyes (put at bedside for pt).   rOPINIRole 2 MG tablet Commonly known as: REQUIP Take 1 tablet (2 mg total) by mouth at bedtime.   senna 8.6 MG Tabs tablet Commonly known as: SENOKOT Take 2 tablets (17.2 mg total) by mouth at bedtime.   tamsulosin 0.4 MG Caps capsule Commonly known as: FLOMAX Take 1 capsule (0.4 mg total) by mouth daily after supper.   traMADol 50 MG tablet Commonly known as: ULTRAM Take 1-2 tablets (50-100 mg total) by mouth every 6 (six) hours as needed for moderate pain.   traZODone 100 MG tablet Commonly known as: DESYREL Take 1 tablet (100 mg total) by mouth at bedtime.   Vitamin D3 125 MCG (5000 UT) Caps Take 5,000 Units by mouth at bedtime.               Durable Medical Equipment  (From admission, onward)           Start     Ordered    03/20/22 1935  For home use only DME Walker rolling  Once       Question Answer Comment  Walker: With [place]   Patient needs a walker to treat with the following condition Difficulty walking      03/20/22 1934              Discharge Care Instructions  (From admission, onward)           Start     Ordered   03/21/22 0000  Weight bearing as tolerated        03/21/22 0749   03/21/22 0000  Change dressing       Comments: You have an adhesive waterproof bandage over the incision. Leave this in place until your first follow-up appointment. Once you remove this you will not need to place another bandage.   03/21/22 0749            Follow-up Information     Gaynelle Arabian, MD. Schedule an appointment as soon as possible for a visit in 2 week(s).   Specialty: Orthopedic Surgery Contact information: [address] [place] [address] [phone]  Signed: R. Jaynie Bream, PA-C Orthopedic Surgery 03/21/2022, 8:46 PM

## 2022-03-21 NOTE — Progress Notes (Signed)
Physical Therapy Treatment Patient Details Name: Edward Waller MRN: [id number] DOB: [birth year] Today's Date: 03/21/2022   History of Present Illness 88 yo s/p R DATHA 03/20/2022 with PMH of L anke fracture 2022, orbital fracture 07/06/2021 and R tib/fib ORIF 07/06/2021.    PT Comments    Pt more stable with UE use/support through RW however pt attempting not to use RW much (wants to get back to normal asap).  Pt educated to slowly progress and allow hip to heal, being mindful of safety and preventing falls.  Pt ambulated good distance in hallway per request and also practiced stair technique x3 with spouse.  Pt provided with HEP and stair handouts.  Pt has reviewed all mobility and exercises to d/c home however has not yet urinated (informed RN pt was not able to void with PT).    Recommendations for follow up therapy are one component of a multi-disciplinary discharge planning process, led by the attending physician.  Recommendations may be updated based on patient status, additional functional criteria and insurance authorization.  Follow Up Recommendations  Follow physician's recommendations for discharge plan and follow up therapies     Assistance Recommended at Discharge Frequent or constant Supervision/Assistance  Patient can return home with the following A little help with walking and/or transfers;A little help with bathing/dressing/bathroom;Assist for transportation;Help with stairs or ramp for entrance   Equipment Recommendations  Rolling walker (2 wheels)    Recommendations for Other Services       Precautions / Restrictions Precautions Precautions: None;Fall Restrictions Weight Bearing Restrictions: No     Mobility  Bed Mobility Overal bed mobility: Needs Assistance Bed Mobility: Supine to Sit, Sit to Supine     Supine to sit: Min guard, HOB elevated Sit to supine: Min guard   General bed mobility comments: pt self assists R LE with UEs    Transfers Overall  transfer level: Needs assistance Equipment used: Rolling walker (2 wheels) Transfers: Sit to/from Stand Sit to Stand: Min guard           General transfer comment: verbal cues for hand placement    Ambulation/Gait Ambulation/Gait assistance: Min guard Gait Distance (Feet): 160 Feet (x2) Assistive device: Rolling walker (2 wheels) Gait Pattern/deviations: Step-to pattern, Decreased stance time - right, Antalgic       General Gait Details: verbal cues for step length, RW positioning, neutral position of R LE, and posture   Stairs Stairs: Yes Stairs assistance: Min guard, Min assist Stair Management: Step to pattern, Backwards, With walker Number of Stairs: 2 General stair comments: verbal cues for sequence, safety; RW positioning; pt performed x3, required min assist for stabilizing initially however min/guard for final 2 performances; spouse held RW for pt, provided handout; daughter in room and agreeable to use and hold gait belt upon pt entering home   Wheelchair Mobility    Modified Rankin (Stroke Patients Only)       Balance                                            Cognition Arousal/Alertness: Awake/alert Behavior During Therapy: WFL for tasks assessed/performed Overall Cognitive Status: Within Functional Limits for tasks assessed  Exercises     General Comments        Pertinent Vitals/Pain Pain Assessment Pain Assessment: 0-10 Pain Score: 5  Pain Location: R hip anterior thigh area Pain Descriptors / Indicators: Sore Pain Intervention(s): Premedicated before session, Repositioned, Monitored during session    Home Living                          Prior Function            PT Goals (current goals can now be found in the care plan section) Progress towards PT goals: Progressing toward goals    Frequency    7X/week      PT Plan Current plan remains  appropriate    Co-evaluation              AM-PAC PT "6 Clicks" Mobility   Outcome Measure  Help needed turning from your back to your side while in a flat bed without using bedrails?: A Little Help needed moving from lying on your back to sitting on the side of a flat bed without using bedrails?: A Little Help needed moving to and from a bed to a chair (including a wheelchair)?: A Little Help needed standing up from a chair using your arms (e.g., wheelchair or bedside chair)?: A Little Help needed to walk in hospital room?: A Little Help needed climbing 3-5 steps with a railing? : A Little 6 Click Score: 18    End of Session Equipment Utilized During Treatment: Gait belt Activity Tolerance: Patient tolerated treatment well Patient left: in bed;with call bell/phone within reach;with family/visitor present Nurse Communication: Mobility status PT Visit Diagnosis: Other abnormalities of gait and mobility (R26.89)     Time: 3614-4315 PT Time Calculation (min) (ACUTE ONLY): 25 min  Charges:  $Gait Training: 23-37 mins                     Jannette Spanner PT, DPT Physical Therapist Acute Rehabilitation Services Preferred contact method: Secure Chat Weekend Pager Only: [phone] Office: [phone]    [name] 03/21/2022, 4:11 PM

## 2022-03-21 NOTE — Plan of Care (Signed)
  Problem: Education: Goal: Knowledge of the prescribed therapeutic regimen will improve Outcome: Progressing   Problem: Activity: Goal: Ability to tolerate increased activity will improve Outcome: Progressing   Problem: Pain Management: Goal: Pain level will decrease with appropriate interventions Outcome: Progressing   Problem: Safety: Goal: Ability to remain free from injury will improve Outcome: Progressing

## 2022-03-21 NOTE — Progress Notes (Signed)
Patient discharged to home w/ family. Given all belongings, instructions, equipment. Verbalized understanding of instructions. Escorted to pov via w/c. 

## 2022-03-21 NOTE — TOC Transition Note (Signed)
Transition of Care [facility]) - CM/SW Discharge Note   Patient Details  Name: Edward Waller MRN: [id number] Date of Birth: 01-02-1933  [facility] CM/SW Contact:  Lennart Pall, LCSW Phone Number: 03/21/2022, 10:45 AM   Clinical Narrative:     Met with pt and wife and confirming he has needed DME at home.  Plan for HEP.  No TOC needs.  Final next level of care: Home/Self Care Barriers to Discharge: No Barriers Identified   Patient Goals and CMS Choice Patient states their goals for this hospitalization and ongoing recovery are:: return home      Discharge Placement                       Discharge Plan and Services                DME Arranged: N/A DME Agency: NA                  Social Determinants of Health (SDOH) Interventions     Readmission Risk Interventions     No data to display

## 2022-03-21 NOTE — Progress Notes (Signed)
Physical Therapy Treatment Patient Details Name: Edward Waller MRN: [id number] DOB: December 05, 1932 Today's Date: 03/21/2022   History of Present Illness 88 yo s/p R DATHA 03/20/2022 with PMH of L anke fracture 2022, orbital fracture 07/06/2021 and R tib/fib ORIF 07/06/2021.    PT Comments    Pt ambulated in hallway and then performed HEP exercises.  Will return this afternoon to ambulate again and practice safe stair technique.    Recommendations for follow up therapy are one component of a multi-disciplinary discharge planning process, led by the attending physician.  Recommendations may be updated based on patient status, additional functional criteria and insurance authorization.  Follow Up Recommendations  Follow physician's recommendations for discharge plan and follow up therapies     Assistance Recommended at Discharge Frequent or constant Supervision/Assistance  Patient can return home with the following A little help with walking and/or transfers;A little help with bathing/dressing/bathroom;Assist for transportation;Help with stairs or ramp for entrance   Equipment Recommendations  Rolling walker (2 wheels)    Recommendations for Other Services       Precautions / Restrictions Precautions Precautions: None;Fall Restrictions Weight Bearing Restrictions: No     Mobility  Bed Mobility Overal bed mobility: Needs Assistance Bed Mobility: Supine to Sit, Sit to Supine     Supine to sit: Min guard, HOB elevated Sit to supine: Min guard   General bed mobility comments: pt self assists R LE with UEs    Transfers Overall transfer level: Needs assistance Equipment used: Rolling walker (2 wheels) Transfers: Sit to/from Stand Sit to Stand: Min assist           General transfer comment: assist to rise and steady, pt pulled up undergarments standing EOB with min/guard for safety    Ambulation/Gait Ambulation/Gait assistance: Min guard Gait Distance (Feet): 160  Feet Assistive device: Rolling walker (2 wheels) Gait Pattern/deviations: Step-to pattern, Decreased stance time - right, Antalgic       General Gait Details: verbal cues for step length, RW positioning, neutral position of R LE, and posture   Stairs             Wheelchair Mobility    Modified Rankin (Stroke Patients Only)       Balance                                            Cognition Arousal/Alertness: Awake/alert Behavior During Therapy: WFL for tasks assessed/performed Overall Cognitive Status: Within Functional Limits for tasks assessed                                          Exercises Total Joint Exercises Ankle Circles/Pumps: AROM, Both, 10 reps Quad Sets: AROM, Both, 10 reps Heel Slides: AAROM, Supine, Right, 10 reps Hip ABduction/ADduction: AAROM, Supine, Right, 10 reps, Standing (standing exercises performed at sink countertop for support) Long Arc Quad: AROM, Right, Seated, 10 reps Knee Flexion: AROM, Right, 10 reps, Standing Marching in Standing: AROM, Right, Standing, 10 reps    General Comments        Pertinent Vitals/Pain Pain Assessment Pain Assessment: 0-10 Pain Score: 4  Pain Location: R hip anterior thigh area Pain Descriptors / Indicators: Sore Pain Intervention(s): Repositioned, Monitored during session, Premedicated before session    Home Living  Prior Function            PT Goals (current goals can now be found in the care plan section) Progress towards PT goals: Progressing toward goals    Frequency    7X/week      PT Plan Current plan remains appropriate    Co-evaluation              AM-PAC PT "6 Clicks" Mobility   Outcome Measure  Help needed turning from your back to your side while in a flat bed without using bedrails?: A Little Help needed moving from lying on your back to sitting on the side of a flat bed without using  bedrails?: A Little Help needed moving to and from a bed to a chair (including a wheelchair)?: A Little Help needed standing up from a chair using your arms (e.g., wheelchair or bedside chair)?: A Little Help needed to walk in hospital room?: A Little Help needed climbing 3-5 steps with a railing? : A Little 6 Click Score: 18    End of Session Equipment Utilized During Treatment: Gait belt Activity Tolerance: Patient tolerated treatment well Patient left: in bed;with call bell/phone within reach;with bed alarm set;with family/visitor present Nurse Communication: Mobility status PT Visit Diagnosis: Other abnormalities of gait and mobility (R26.89)     Time: 5643-3295 PT Time Calculation (min) (ACUTE ONLY): 33 min  Charges:  $Gait Training: 8-22 mins $Therapeutic Exercise: 8-22 mins                    Jannette Spanner PT, DPT Physical Therapist Acute Rehabilitation Services Preferred contact method: Secure Chat Weekend Pager Only: [phone] Office: [place] 03/21/2022, 4:02 PM

## 2022-03-21 NOTE — Progress Notes (Signed)
Patient still due to void. Bladder scan <261m. Encouraged PO intake and administered Flomax. Will monitor for output.

## 2022-03-21 NOTE — Progress Notes (Signed)
   Subjective: 1 Day Post-Op Procedure(s) (LRB): TOTAL HIP ARTHROPLASTY ANTERIOR APPROACH (Right) Patient seen in rounds by Dr. Wynelle Link. Patient is well, and has had no acute complaints or problems. Denies SOB or chest pain. Denies calf pain. Foley cath removed this AM. Patient reports pain as mild. Worked with physical therapy yesterday and ambulated 20'.  Objective: Vital signs in last 24 hours: Temp:  [97.4 F (36.3 C)-98.2 F (36.8 C)] 98.2 F (36.8 C) (11/30 0607) Pulse Rate:  [60-89] 79 (11/30 0607) Resp:  [13-21] 18 (11/30 0607) BP: (117-164)/(64-97) 119/82 (11/30 0607) SpO2:  [91 %-100 %] 96 % (11/30 0607) Weight:  [73 kg] 73 kg (11/29 0823)  Intake/Output from previous day:  Intake/Output Summary (Last 24 hours) at 03/21/2022 0745 Last data filed at 03/21/2022 0600 Gross per 24 hour  Intake 3653.64 ml  Output 3775 ml  Net -121.36 ml     Intake/Output this shift: No intake/output data recorded.  Labs: Recent Labs    03/21/22 0347  HGB 13.1   Recent Labs    03/21/22 0347  WBC 14.5*  RBC 4.08*  HCT 39.4  PLT 179   Recent Labs    03/21/22 0347  NA 137  K 4.2  CL 105  CO2 25  BUN 18  CREATININE 1.00  GLUCOSE 161*  CALCIUM 8.7*   No results for input(s): "LABPT", "INR" in the last 72 hours.  Exam: General - Patient is Alert and Oriented Extremity - Neurologically intact Neurovascular intact Sensation intact distally Dorsiflexion/Plantar flexion intact Dressing - dressing C/D/I Motor Function - intact, moving foot and toes well on exam.  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   Weight as of this encounter: 73 kg. Advance diet Up with therapy D/C IV fluids  DVT Prophylaxis - Aspirin Weight bearing as tolerated.  Continue physical therapy. Expected discharge home today pending progress with physical therapy and if meeting patient goals. Will do HEP once discharged. Follow-up in clinic in 2 weeks.  The PDMP database was reviewed today prior to any opioid medications being prescribed to this patient.  R. Jaynie Bream, PA-C Orthopedic Surgery [phone] 03/21/2022, 7:45 AM

## 2022-06-27 IMAGING — US US EXTREM LOW VENOUS*R*
1 series · 13 of 24 positions shown · non-contrast
Comparison: RIGHT lower extremity XRs, 07/20/2021.

CLINICAL DATA: Pain and swelling of RIGHT lower extremity.

EXAM:
RIGHT LOWER EXTREMITY VENOUS DOPPLER ULTRASOUND
TECHNIQUE: Gray-scale sonography with compression, as well as color and duplex
ultrasound, were performed to evaluate the deep venous system(s)
from the level of the common femoral vein through the popliteal and
proximal calf veins.

[Series 1: us extrem low venous*right* · 0.06mm/px · 13 of 50 slices shown]
[im 1/50]
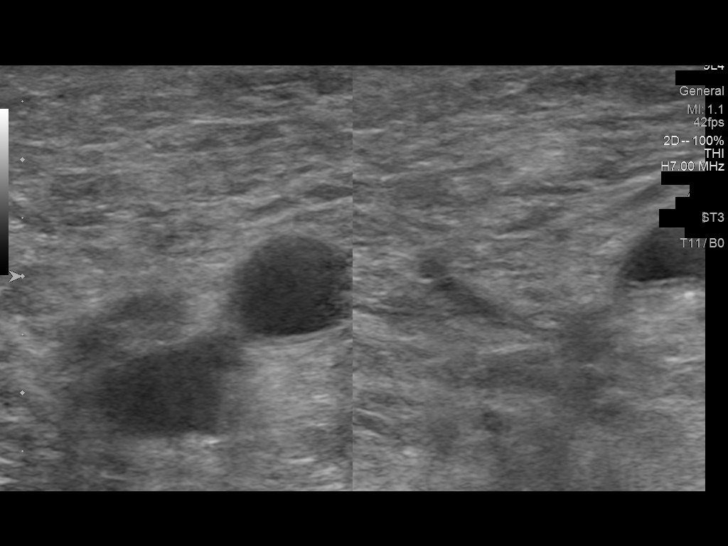
[im 5/50]
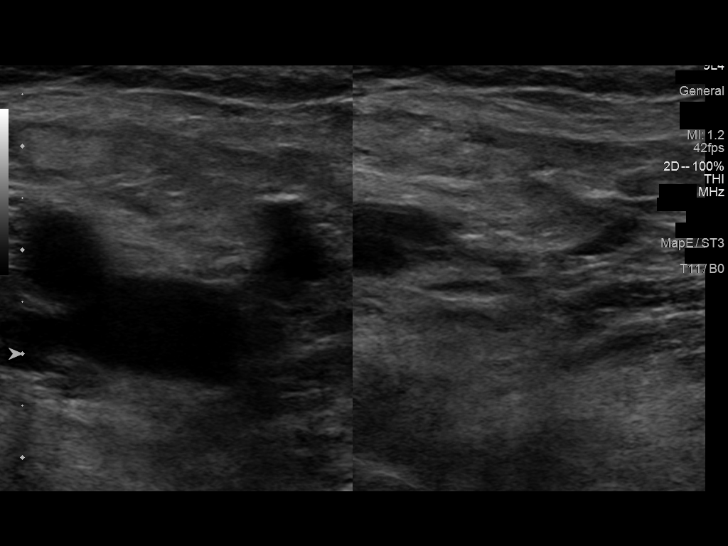
[im 9/50]
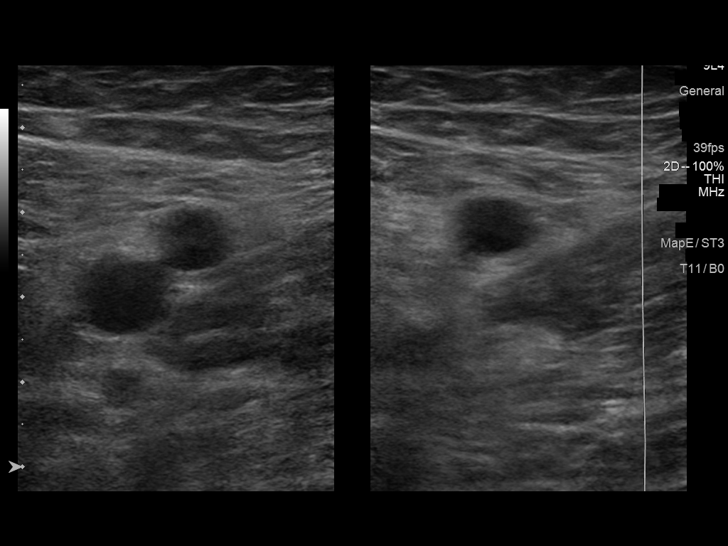
[im 13/50]
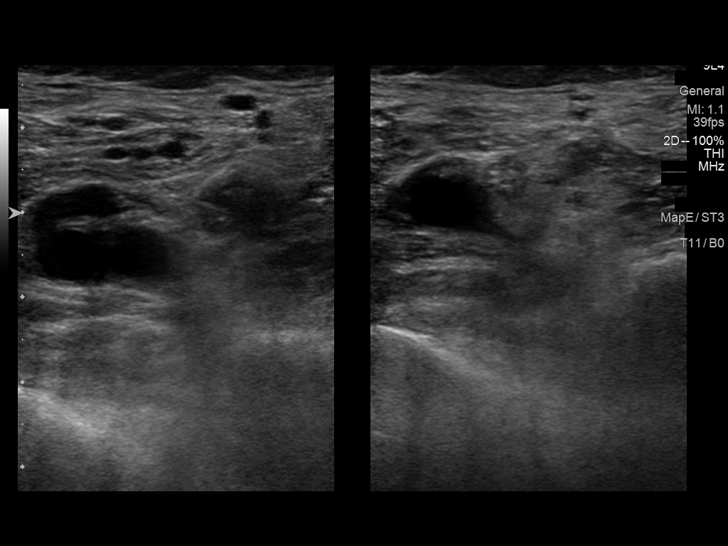
[im 18/50]
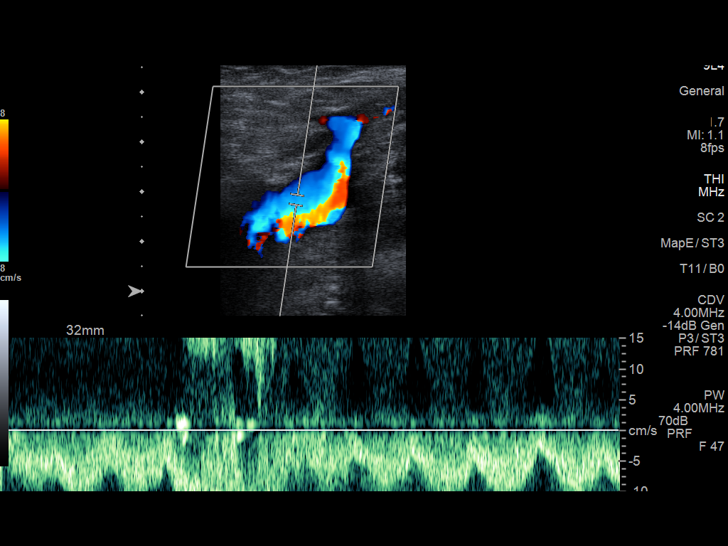
[im 22/50]
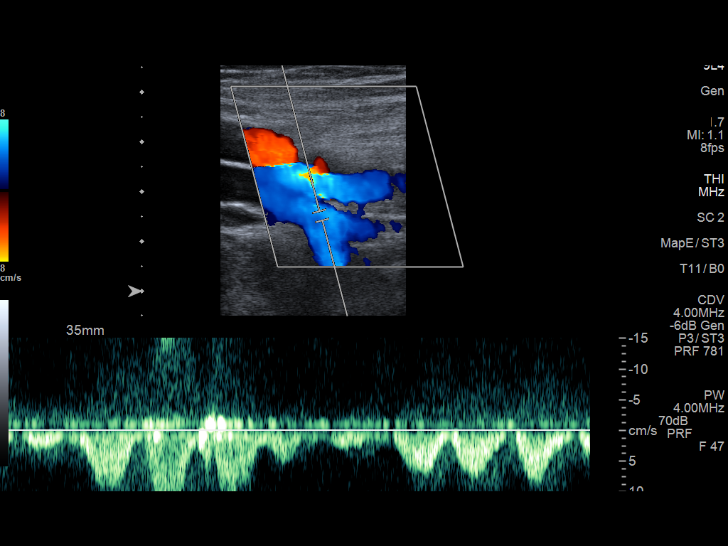
[im 26/50]
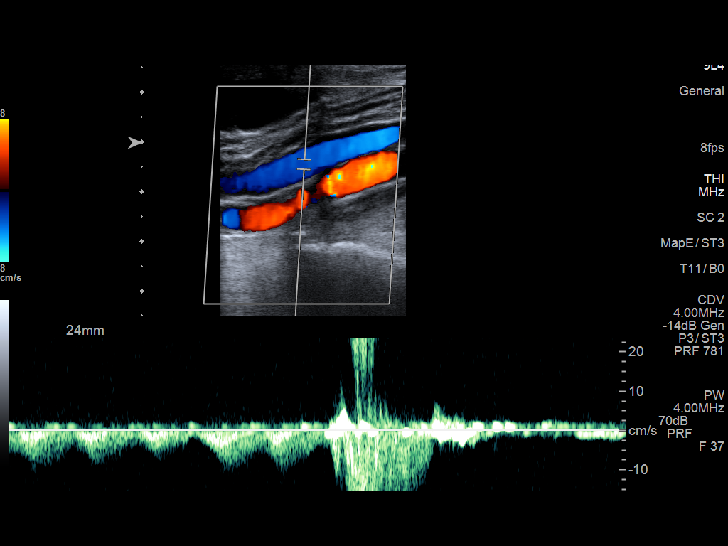
[im 28/50]
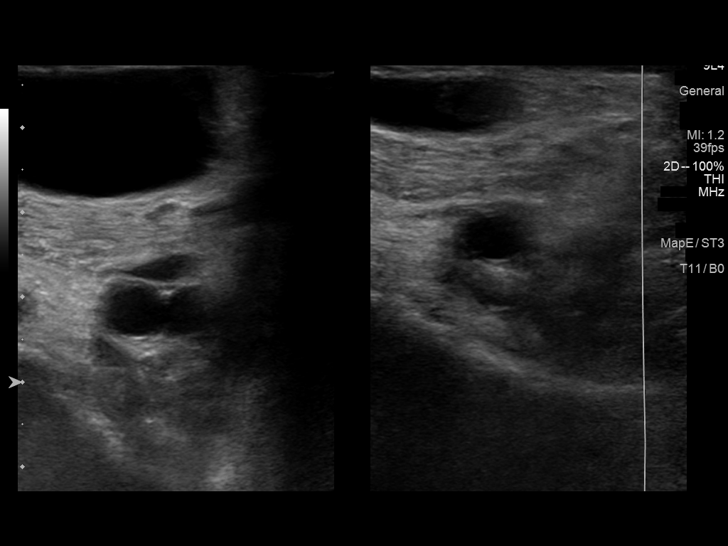
[im 32/50]
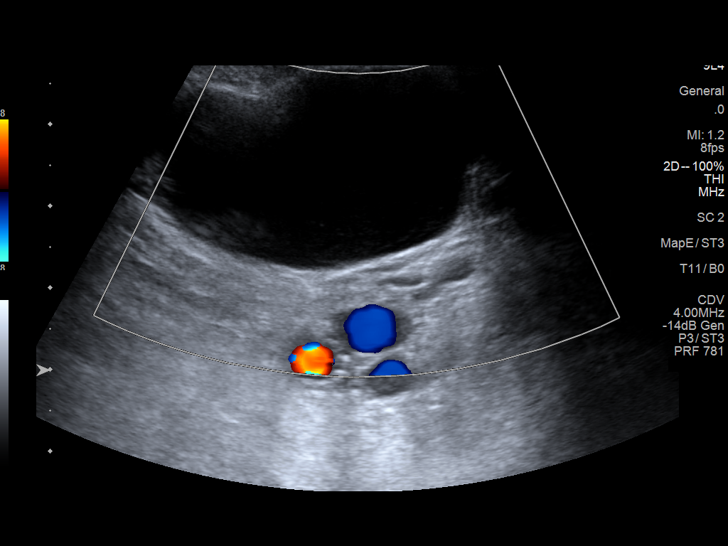
[im 37/50]
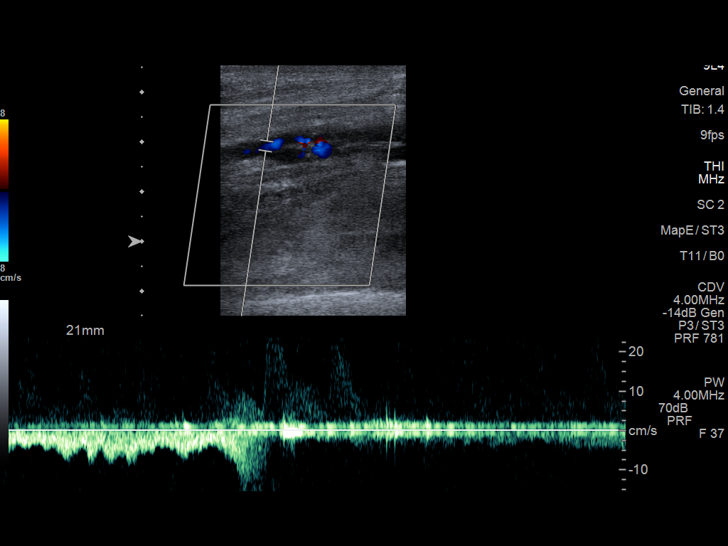
[im 41/50]
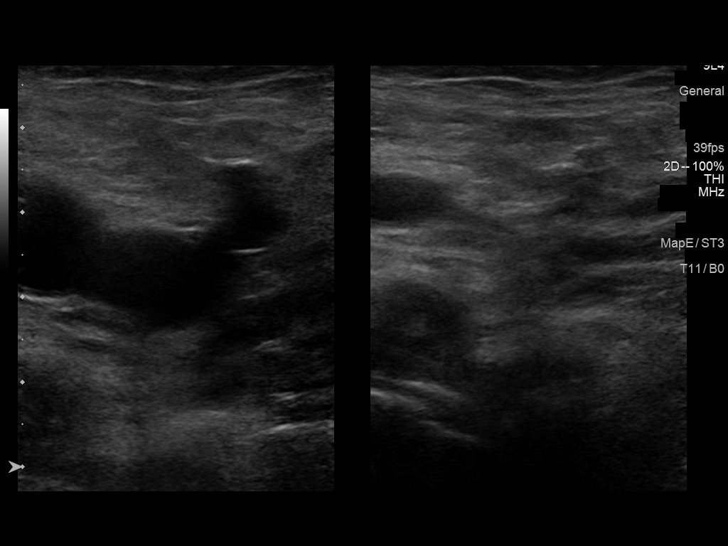
[im 45/50]
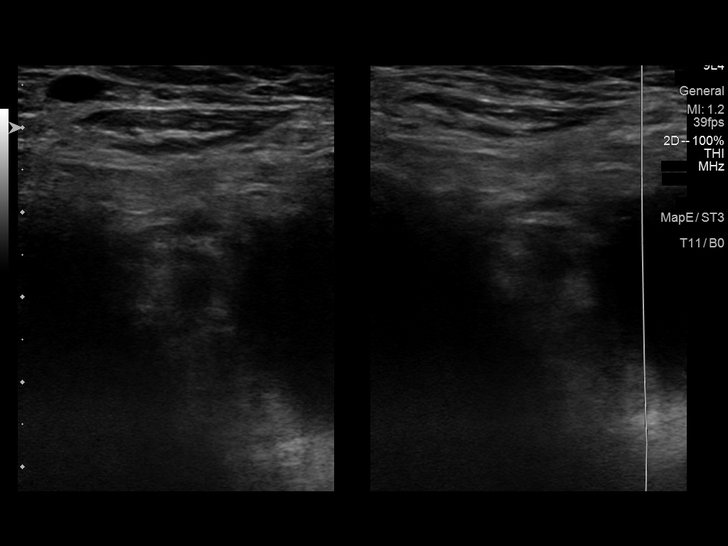
[im 50/50]
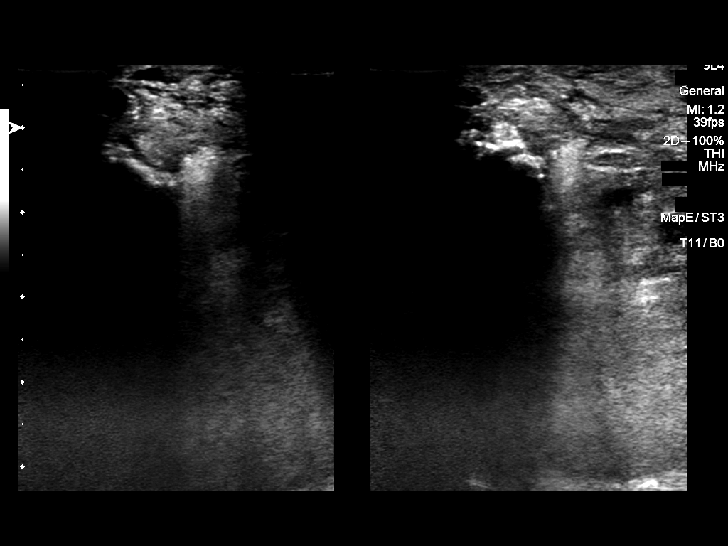

[13 of 24 positions shown; findings below may reference images not displayed]

FINDINGS: VENOUS

Normal compressibility of the RIGHT common femoral, superficial
femoral, and popliteal veins, as well as the visualized calf veins.
Visualized portions of profunda femoral vein and great saphenous
vein unremarkable. No filling defects to suggest DVT on grayscale or
color Doppler imaging. Doppler waveforms show normal direction of
venous flow, normal respiratory plasticity and response to
augmentation.

Limited views of the contralateral common femoral vein are
unremarkable.

OTHER

No evidence of superficial thrombophlebitis.

Well-circumscribed anechoic collection at the posterior RIGHT knee,
with minimal internal debris, measuring up to 5.3 x 2.4 x 3.4 cm,
consistent with a popliteal fossa/Baker cyst.

Limitations: none
IMPRESSION: 1. No evidence of femoropopliteal DVT within the RIGHT lower
extremity.
2. 5 cm RIGHT popliteal fossa/Baker cyst.

## 2022-10-20 ENCOUNTER — Encounter (HOSPITAL_COMMUNITY): Payer: Self-pay

## 2022-10-20 ENCOUNTER — Emergency Department (HOSPITAL_COMMUNITY): Payer: No Typology Code available for payment source

## 2022-10-20 ENCOUNTER — Emergency Department (HOSPITAL_COMMUNITY)
Admission: EM | Admit: 2022-10-20 | Discharge: 2022-10-20 | Disposition: A | Discharge status: Home / Self Care | Payer: No Typology Code available for payment source | Attending: Emergency Medicine | Admitting: Emergency Medicine

## 2022-10-20 ENCOUNTER — Other Ambulatory Visit: Payer: Self-pay

## 2022-10-20 DIAGNOSIS — S92404A Nondisplaced unspecified fracture of right great toe, initial encounter for closed fracture: Secondary | ICD-10-CM

## 2022-10-20 DIAGNOSIS — Y92002 Bathroom of unspecified non-institutional (private) residence single-family (private) house as the place of occurrence of the external cause: Secondary | ICD-10-CM | POA: Diagnosis not present

## 2022-10-20 DIAGNOSIS — S92424A Nondisplaced fracture of distal phalanx of right great toe, initial encounter for closed fracture: Secondary | ICD-10-CM | POA: Diagnosis not present

## 2022-10-20 DIAGNOSIS — S99921A Unspecified injury of right foot, initial encounter: Secondary | ICD-10-CM | POA: Diagnosis present

## 2022-10-20 DIAGNOSIS — R42 Dizziness and giddiness: Secondary | ICD-10-CM | POA: Diagnosis not present

## 2022-10-20 DIAGNOSIS — W01198A Fall on same level from slipping, tripping and stumbling with subsequent striking against other object, initial encounter: Secondary | ICD-10-CM | POA: Diagnosis not present

## 2022-10-20 LAB — CBC
HCT: 48.1 % (ref 39.0–52.0)
Hemoglobin: 15.9 g/dL (ref 13.0–17.0)
MCH: 32.1 pg (ref 26.0–34.0)
MCHC: 33.1 g/dL (ref 30.0–36.0)
MCV: 97.2 fL (ref 80.0–100.0)
Platelets: 176 10*3/uL (ref 150–400)
RBC: 4.95 MIL/uL (ref 4.22–5.81)
RDW: 13.7 % (ref 11.5–15.5)
WBC: 6 10*3/uL (ref 4.0–10.5)
nRBC: 0 % (ref 0.0–0.2)

## 2022-10-20 LAB — URINALYSIS, ROUTINE W REFLEX MICROSCOPIC
Bilirubin Urine: NEGATIVE
Glucose, UA: NEGATIVE mg/dL
Hgb urine dipstick: NEGATIVE
Ketones, ur: 5 mg/dL — AB
Leukocytes,Ua: NEGATIVE
Nitrite: NEGATIVE
Protein, ur: NEGATIVE mg/dL
Specific Gravity, Urine: 1.011 (ref 1.005–1.030)
pH: 6 (ref 5.0–8.0)

## 2022-10-20 LAB — BASIC METABOLIC PANEL
Anion gap: 7 (ref 5–15)
BUN: 22 mg/dL (ref 8–23)
CO2: 25 mmol/L (ref 22–32)
Calcium: 8.8 mg/dL — ABNORMAL LOW (ref 8.9–10.3)
Chloride: 104 mmol/L (ref 98–111)
Creatinine, Ser: 0.7 mg/dL (ref 0.61–1.24)
GFR, Estimated: >60 mL/min (ref >60)
Glucose, Bld: 136 mg/dL — ABNORMAL HIGH (ref 70–99)
Potassium: 4 mmol/L (ref 3.5–5.1)
Sodium: 136 mmol/L (ref 135–145)

## 2022-10-20 LAB — TROPONIN I (HIGH SENSITIVITY): Troponin I (High Sensitivity): 8 ng/L (ref <18)

## 2022-10-20 LAB — CBG MONITORING, ED: Glucose-Capillary: 137 mg/dL — ABNORMAL HIGH (ref 70–99)

## 2022-10-20 MED ORDER — MECLIZINE HCL 25 MG PO TABS: 25.0000 mg | ORAL_TABLET | Freq: Three times a day (TID) | ORAL | 0 refills | Status: DC | PRN

## 2022-10-20 MED ORDER — MECLIZINE HCL 25 MG PO TABS
25.0000 mg | ORAL_TABLET | Freq: Once | ORAL | Status: AC
  Administered 2022-10-20: 25 mg via ORAL
  Filled 2022-10-20: qty 1

## 2022-10-20 MED ORDER — CARBAMIDE PEROXIDE 6.5 % OT SOLN: 5.0000 [drp] | Freq: Two times a day (BID) | OTIC | 0 refills | Status: AC

## 2022-10-20 NOTE — Discharge Instructions (Addendum)
Please schedule a follow-up appointment with your primary care provider at the [facility] for your episode of vertigo, as well as for your toe fracture.  You can be weightbearing as tolerated on the right foot.  Please use a walker at all times for the next several days, to ensure your balance and prevent falls.  If you have further episodes of vertigo, your primary care provider's office may need to refer you to see an ENT specialist, who is your nose and throat.  They will often manage cases of peripheral vertigo or vertigo related to conditions inside the ear.

## 2022-10-20 NOTE — ED Provider Notes (Signed)
[place] EMERGENCY DEPARTMENT AT [facility] Provider Note   CSN: [id number] Arrival date & time: 10/20/22  4098     History  Chief Complaint  Patient presents with   Dizziness    Edward Waller is a 89 y.o. male presenting to ED with complaint of dizziness.  Patient reports that he woke up earlier this morning or 3 AM to use the bathroom.  When he got out of bed he noticed that "my balance was off".  He said when he was trying to urinate in the bathroom he had a sudden onset of room spinning sensation, and then took a tumble, stubbing his right toe against the wall.  He did not strike his head or lose consciousness.  He said he was able to get back in the bed and then felt like "the room was spinning around me".  He says since arriving to ED his symptoms are "99.9% better" but he still has some mild vertigo.  He denies any prior history of vertigo.  He says he does have chronic tinnitus and is being referred to ENT or specialist by his PCP at the [facility].  He denies any headache, blurred vision, history of TIA or stroke.  He reports typically his blood pressure is well-controlled without medications.  He denies history of diabetes and says that he does not have high cholesterol.  He lives with his wife at home.  HPI     Home Medications Prior to Admission medications   Medication Sig Start Date End Date Taking? Authorizing Provider  carbamide peroxide (DEBROX) 6.5 % OTIC solution Place 5 drops into both ears 2 (two) times daily for 5 days. 10/20/22 10/25/22 Yes Latina Frank, Kermit Balo, MD  meclizine (ANTIVERT) 25 MG tablet Take 1 tablet (25 mg total) by mouth 3 (three) times daily as needed for up to 21 doses for dizziness. 10/20/22  Yes Jakori Burkett, Kermit Balo, MD  acetaminophen (TYLENOL) 325 MG tablet Take 2 tablets (650 mg total) by mouth 4 (four) times daily -  with meals and at bedtime. Patient taking differently: Take 650 mg by mouth at bedtime as needed for moderate pain.  07/24/21   Love, Evlyn Kanner, PA-C  Cholecalciferol (VITAMIN D3) 125 MCG (5000 UT) CAPS Take 5,000 Units by mouth at bedtime.    [provider]  Flaxseed, Linseed, (FLAX SEED OIL PO) Take 1 capsule by mouth at bedtime.    [provider]  HYDROcodone-acetaminophen (NORCO/VICODIN) 5-325 MG tablet Take 1-2 tablets by mouth every 6 (six) hours as needed for severe pain. 03/21/22   Shuford, French Ana, PA-C  levothyroxine (SYNTHROID) 50 MCG tablet Take 50 mcg by mouth daily before breakfast.    [provider]  Magnesium Oxide 420 (252 Mg) MG TABS Take 420 mg by mouth every morning.    [provider]  melatonin 3 MG TABS tablet Take 3 mg by mouth at bedtime.    [provider]  methocarbamol (ROBAXIN) 500 MG tablet Take 1 tablet (500 mg total) by mouth every 6 (six) hours as needed for muscle spasms. 03/21/22   Shuford, French Ana, PA-C  Multiple Vitamin (MULTIVITAMIN WITH MINERALS) TABS tablet Take 1 tablet by mouth every morning.    [provider]  polyvinyl alcohol (LIQUIFILM TEARS) 1.4 % ophthalmic solution Place 1 drop into the right eye as needed for dry eyes (put at bedside for pt). Patient not taking: Reported on 03/07/2022 07/24/21   Love, Evlyn Kanner, PA-C  rOPINIRole (REQUIP) 2  MG tablet Take 1 tablet (2 mg total) by mouth at bedtime. 07/24/21   Love, Evlyn Kanner, PA-C  senna (SENOKOT) 8.6 MG TABS tablet Take 2 tablets (17.2 mg total) by mouth at bedtime. Patient not taking: Reported on 03/07/2022 07/24/21   Love, Evlyn Kanner, PA-C  tamsulosin (FLOMAX) 0.4 MG CAPS capsule Take 1 capsule (0.4 mg total) by mouth daily after supper. 07/24/21   Love, Evlyn Kanner, PA-C  traMADol (ULTRAM) 50 MG tablet Take 1-2 tablets (50-100 mg total) by mouth every 6 (six) hours as needed for moderate pain. 03/21/22   Shuford, French Ana, PA-C  traZODone (DESYREL) 100 MG tablet Take 1 tablet (100 mg total) by mouth at bedtime. Patient not taking: Reported on 03/07/2022 07/24/21   Jacquelynn Cree,  PA-C      Allergies    Patient has no known allergies.    Review of Systems   Review of Systems  Physical Exam Updated Vital Signs BP (!) 159/80   Pulse 63   Temp 97.6 F (36.4 C) (Oral)   Resp 12   Ht 6' (1.829 m)   Wt 73 kg   SpO2 100%   BMI 21.83 kg/m  Physical Exam Constitutional:      General: He is not in acute distress. HENT:     Head: Normocephalic and atraumatic.  Eyes:     Conjunctiva/sclera: Conjunctivae normal.     Pupils: Pupils are equal, round, and reactive to light.  Cardiovascular:     Rate and Rhythm: Normal rate and regular rhythm.  Pulmonary:     Effort: Pulmonary effort is normal. No respiratory distress.  Abdominal:     General: There is no distension.     Tenderness: There is no abdominal tenderness.  Musculoskeletal:     Comments: Mild erythema and swelling around the joint of right large toe  Skin:    General: Skin is warm and dry.  Neurological:     General: No focal deficit present.     Mental Status: He is alert and oriented to person, place, and time. Mental status is at baseline.     Cranial Nerves: No cranial nerve deficit.     Sensory: No sensory deficit.     Motor: No weakness.     Gait: Gait normal.     Comments: No nystagmus on exam, no active vertigo symptoms  Psychiatric:        Mood and Affect: Mood normal.        Behavior: Behavior normal.     ED Results / Procedures / Treatments   Labs (all labs ordered are listed, but only abnormal results are displayed) Labs Reviewed  BASIC METABOLIC PANEL - Abnormal; Notable for the following components:      Result Value   Glucose, Bld 136 (*)    Calcium 8.8 (*)    All other components within normal limits  URINALYSIS, ROUTINE W REFLEX MICROSCOPIC - Abnormal; Notable for the following components:   APPearance HAZY (*)    Ketones, ur 5 (*)    All other components within normal limits  CBG MONITORING, ED - Abnormal; Notable for the following components:   Glucose-Capillary  137 (*)    All other components within normal limits  CBC  TROPONIN I (HIGH SENSITIVITY)    EKG EKG Interpretation Date/Time:  Sunday October 20 2022 09:13:46 EDT Ventricular Rate:  65 PR Interval:  164 QRS Duration:  97 QT Interval:  396 QTC Calculation: 412 R Axis:   24  Text Interpretation: Sinus rhythm Probable left atrial enlargement Confirmed by Alvester Chou [phone]) on 10/20/2022 9:32:34 AM  Radiology MR BRAIN WO CONTRAST  Result Date: 10/20/2022 CLINICAL DATA:  TIA.  Acute vertigo and ataxia.  Fall. EXAM: MRI HEAD WITHOUT CONTRAST TECHNIQUE: Multiplanar, multiecho pulse sequences of the brain and surrounding structures were obtained without intravenous contrast. COMPARISON:  Head CT 07/06/2021 FINDINGS: Brain: There is no evidence of an acute infarct, intracranial hemorrhage, mass, midline shift, or extra-axial fluid collection. T2 hyperintensities in the periventricular white matter nonspecific but compatible with mild chronic small vessel ischemic disease. There is a tiny chronic cortical infarct in the right occipital lobe. A chronic lacunar infarct is noted in the right caudate nucleus. There is mild cerebral and cerebellar atrophy. Vascular: Major intracranial vascular flow voids are preserved. Skull and upper cervical spine: Unremarkable bone marrow signal. Advanced facet arthrosis at C3-4 with grade 1 anterolisthesis. Sinuses/Orbits: Bilateral cataract extraction. Minimal mucosal thickening in the paranasal sinuses. Small left mastoid effusion. Other: None. IMPRESSION: 1. No acute intracranial abnormality. 2. Mild chronic small vessel ischemic disease. Electronically Signed   By: Sebastian Ache M.D.   On: 10/20/2022 12:35   DG Toe Great Right  Result Date: 10/20/2022 CLINICAL DATA:  eval for fx post fall and blunt trauma EXAM: RIGHT GREAT TOE COMPARISON:  None Available. FINDINGS: Nondisplaced possible mildly impacted transverse fracture of the distal phalanx right great toe.  Indeterminate involvement of the articular surface although there is no significant step-off deformity. Moderate DJD at the first MTP joint. IMPRESSION: Nondisplaced fracture distal phalanx right great toe. Electronically Signed   By: Corlis Leak M.D.   On: 10/20/2022 10:27    Procedures Procedures    Medications Ordered in ED Medications  meclizine (ANTIVERT) tablet 25 mg (25 mg Oral Given 10/20/22 1040)    ED Course/ Medical Decision Making/ A&P Clinical Course as of 10/20/22 1349  Sun Oct 20, 2022  1339 Pt ambulated with walker, he appears a little more off balance than baseline but his daughter, who is a Engineer, civil (consulting), is quite comfortable taking him home and prefer to do so.  She reports that he has plenty of handrails and support in the house, and also a walker in the house, and they will keep an extra eye on it.  I think this is reasonable.  Okay for discharge [MT]    Clinical Course User Index [MT] Myleka Moncure, Kermit Balo, MD                             Medical Decision Making Amount and/or Complexity of Data Reviewed Labs: ordered. Radiology: ordered.  Risk OTC drugs.   This patient presents to the ED with concern for vertigo, potential ataxia, now resolved. This involves an extensive number of treatment options, and is a complaint that carries with it a high risk of complications and morbidity.  The differential diagnosis includes peripheral vertigo versus central vertigo versus TIA versus arrhythmia versus other  Co-morbidities that complicate the patient evaluation: High blood pressure and age risk factors for cardiovascular disease and stroke  Patient does not have active symptoms on exam, and HINTS exam is not indicated in this setting.  Given the abrupt onset of his symptoms and the possibility of ataxia? (Patient had reported balance problems prior to vertigo sensation), I do think an MRI of the brain to be reasonable to evaluate for posterior cerebellar TIA or lesion.  Additional  history obtained  from EMS  I ordered and personally interpreted labs.  The pertinent results include: No emergent findings  I ordered imaging studies including MR brain, xray toe I independently visualized and interpreted imaging which showed nondisplaced fracture of the right large toe, no CVA noted on MRI imaging I agree with the radiologist interpretation  The patient was maintained on a cardiac monitor.  I personally viewed and interpreted the cardiac monitored which showed an underlying rhythm of: NSR  Per my interpretation the patient's ECG shows no acute ischemic findings  I have reviewed the patients home medicines and have made adjustments as needed  After the interventions noted above, I reevaluated the patient and found that they have: improved    Dispostion:  After consideration of the diagnostic results and the patients response to treatment, I feel that the patent would benefit from outpatient PCP follow-up.  He can be weightbearing as tolerated, will try postop shoe, and he ready has a walker at home.  His wife is present at the bedside.  I updated both of them.  He will need follow-up with his PCP.  If he is able to ambulate steadily here in the ED can be discharged home.         Final Clinical Impression(s) / ED Diagnoses Final diagnoses:  Closed nondisplaced fracture of phalanx of right great toe, unspecified phalanx, initial encounter  Vertigo    Rx / DC Orders ED Discharge Orders          Ordered    meclizine (ANTIVERT) 25 MG tablet  3 times daily PRN        10/20/22 1252    carbamide peroxide (DEBROX) 6.5 % OTIC solution  2 times daily        10/20/22 1339              Terald Sleeper, MD 10/20/22 1349

## 2022-10-20 NOTE — ED Notes (Signed)
ED Provider at bedside. 

## 2022-10-20 NOTE — ED Notes (Signed)
Pt provided discharge instructions and prescription information. Pt was given the opportunity to ask questions and questions were answered.   

## 2022-10-20 NOTE — ED Notes (Signed)
Called lab to add trop to specimens in lab
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
## 2022-10-20 NOTE — ED Notes (Signed)
Patient transported to CT 

## 2023-01-02 ENCOUNTER — Inpatient Hospital Stay (HOSPITAL_COMMUNITY)
Admission: EM | Admit: 2023-01-02 | Discharge: 2023-01-10 | DRG: 958 | Disposition: A | Discharge status: Rehabilitation Facility | Payer: No Typology Code available for payment source | Attending: General Surgery | Admitting: General Surgery

## 2023-01-02 ENCOUNTER — Encounter (HOSPITAL_COMMUNITY): Payer: Self-pay

## 2023-01-02 ENCOUNTER — Emergency Department (HOSPITAL_COMMUNITY): Payer: No Typology Code available for payment source

## 2023-01-02 ENCOUNTER — Inpatient Hospital Stay (HOSPITAL_COMMUNITY): Payer: No Typology Code available for payment source

## 2023-01-02 ENCOUNTER — Other Ambulatory Visit: Payer: Self-pay

## 2023-01-02 ENCOUNTER — Other Ambulatory Visit: Payer: Self-pay | Admitting: Neurosurgery

## 2023-01-02 DIAGNOSIS — F4320 Adjustment disorder, unspecified: Secondary | ICD-10-CM

## 2023-01-02 DIAGNOSIS — J939 Pneumothorax, unspecified: Secondary | ICD-10-CM

## 2023-01-02 DIAGNOSIS — S22000A Wedge compression fracture of unspecified thoracic vertebra, initial encounter for closed fracture: Principal | ICD-10-CM

## 2023-01-02 DIAGNOSIS — S23150A Subluxation of T8/T9 thoracic vertebra, initial encounter: Secondary | ICD-10-CM | POA: Diagnosis present

## 2023-01-02 DIAGNOSIS — S22069A Unspecified fracture of T7-T8 vertebra, initial encounter for closed fracture: Secondary | ICD-10-CM | POA: Diagnosis present

## 2023-01-02 DIAGNOSIS — M159 Polyosteoarthritis, unspecified: Secondary | ICD-10-CM | POA: Diagnosis present

## 2023-01-02 DIAGNOSIS — F419 Anxiety disorder, unspecified: Secondary | ICD-10-CM | POA: Diagnosis not present

## 2023-01-02 DIAGNOSIS — R338 Other retention of urine: Secondary | ICD-10-CM | POA: Diagnosis present

## 2023-01-02 DIAGNOSIS — G2581 Restless legs syndrome: Secondary | ICD-10-CM | POA: Diagnosis present

## 2023-01-02 DIAGNOSIS — Y92410 Unspecified street and highway as the place of occurrence of the external cause: Secondary | ICD-10-CM

## 2023-01-02 DIAGNOSIS — I959 Hypotension, unspecified: Secondary | ICD-10-CM | POA: Diagnosis present

## 2023-01-02 DIAGNOSIS — R252 Cramp and spasm: Secondary | ICD-10-CM | POA: Diagnosis not present

## 2023-01-02 DIAGNOSIS — Z6832 Body mass index (BMI) 32.0-32.9, adult: Secondary | ICD-10-CM | POA: Diagnosis not present

## 2023-01-02 DIAGNOSIS — G253 Myoclonus: Secondary | ICD-10-CM | POA: Diagnosis not present

## 2023-01-02 DIAGNOSIS — F32A Depression, unspecified: Secondary | ICD-10-CM | POA: Diagnosis not present

## 2023-01-02 DIAGNOSIS — E039 Hypothyroidism, unspecified: Secondary | ICD-10-CM | POA: Diagnosis present

## 2023-01-02 DIAGNOSIS — Z9181 History of falling: Secondary | ICD-10-CM

## 2023-01-02 DIAGNOSIS — Z79890 Hormone replacement therapy (postmenopausal): Secondary | ICD-10-CM

## 2023-01-02 DIAGNOSIS — Z79899 Other long term (current) drug therapy: Secondary | ICD-10-CM | POA: Diagnosis not present

## 2023-01-02 DIAGNOSIS — S270XXA Traumatic pneumothorax, initial encounter: Secondary | ICD-10-CM | POA: Diagnosis present

## 2023-01-02 DIAGNOSIS — G8221 Paraplegia, complete: Secondary | ICD-10-CM | POA: Diagnosis present

## 2023-01-02 DIAGNOSIS — N401 Enlarged prostate with lower urinary tract symptoms: Secondary | ICD-10-CM | POA: Diagnosis present

## 2023-01-02 DIAGNOSIS — E872 Acidosis, unspecified: Secondary | ICD-10-CM | POA: Diagnosis not present

## 2023-01-02 DIAGNOSIS — K5900 Constipation, unspecified: Secondary | ICD-10-CM | POA: Diagnosis not present

## 2023-01-02 DIAGNOSIS — Z96641 Presence of right artificial hip joint: Secondary | ICD-10-CM | POA: Diagnosis present

## 2023-01-02 DIAGNOSIS — N319 Neuromuscular dysfunction of bladder, unspecified: Secondary | ICD-10-CM | POA: Diagnosis present

## 2023-01-02 DIAGNOSIS — S22068A Other fracture of T7-T8 thoracic vertebra, initial encounter for closed fracture: Principal | ICD-10-CM | POA: Diagnosis present

## 2023-01-02 DIAGNOSIS — S24109D Unspecified injury at unspecified level of thoracic spinal cord, subsequent encounter: Secondary | ICD-10-CM | POA: Diagnosis not present

## 2023-01-02 DIAGNOSIS — R0789 Other chest pain: Secondary | ICD-10-CM | POA: Diagnosis not present

## 2023-01-02 DIAGNOSIS — K592 Neurogenic bowel, not elsewhere classified: Secondary | ICD-10-CM | POA: Diagnosis present

## 2023-01-02 DIAGNOSIS — S24103A Unspecified injury at T7-T10 level of thoracic spinal cord, initial encounter: Secondary | ICD-10-CM | POA: Diagnosis present

## 2023-01-02 DIAGNOSIS — J9811 Atelectasis: Secondary | ICD-10-CM | POA: Diagnosis not present

## 2023-01-02 DIAGNOSIS — E440 Moderate protein-calorie malnutrition: Secondary | ICD-10-CM | POA: Diagnosis not present

## 2023-01-02 DIAGNOSIS — R001 Bradycardia, unspecified: Secondary | ICD-10-CM | POA: Diagnosis present

## 2023-01-02 DIAGNOSIS — F515 Nightmare disorder: Secondary | ICD-10-CM | POA: Diagnosis not present

## 2023-01-02 DIAGNOSIS — T83511S Infection and inflammatory reaction due to indwelling urethral catheter, sequela: Secondary | ICD-10-CM | POA: Diagnosis not present

## 2023-01-02 DIAGNOSIS — E871 Hypo-osmolality and hyponatremia: Secondary | ICD-10-CM | POA: Diagnosis not present

## 2023-01-02 DIAGNOSIS — N390 Urinary tract infection, site not specified: Secondary | ICD-10-CM | POA: Diagnosis not present

## 2023-01-02 DIAGNOSIS — I82451 Acute embolism and thrombosis of right peroneal vein: Secondary | ICD-10-CM | POA: Diagnosis not present

## 2023-01-02 DIAGNOSIS — R1319 Other dysphagia: Secondary | ICD-10-CM | POA: Diagnosis not present

## 2023-01-02 DIAGNOSIS — S22069S Unspecified fracture of T7-T8 vertebra, sequela: Secondary | ICD-10-CM | POA: Diagnosis not present

## 2023-01-02 HISTORY — DX: Osteoarthritis of hip, unspecified: M16.9

## 2023-01-02 HISTORY — DX: Pain in right hand: M79.641

## 2023-01-02 HISTORY — DX: Disorder of thyroid, unspecified: E07.9

## 2023-01-02 HISTORY — DX: Low back pain, unspecified: M54.50

## 2023-01-02 HISTORY — DX: Unspecified osteoarthritis, unspecified site: M19.90

## 2023-01-02 HISTORY — DX: Pain in right hand: M79.642

## 2023-01-02 HISTORY — DX: Pain in left hand: M79.641

## 2023-01-02 LAB — COMPREHENSIVE METABOLIC PANEL
ALT: 19 U/L (ref 0–44)
AST: 24 U/L (ref 15–41)
Albumin: 3.3 g/dL — ABNORMAL LOW (ref 3.5–5.0)
Alkaline Phosphatase: 61 U/L (ref 38–126)
Anion gap: 11 (ref 5–15)
BUN: 14 mg/dL (ref 8–23)
CO2: 25 mmol/L (ref 22–32)
Calcium: 8.6 mg/dL — ABNORMAL LOW (ref 8.9–10.3)
Chloride: 102 mmol/L (ref 98–111)
Creatinine, Ser: 1.06 mg/dL (ref 0.61–1.24)
GFR, Estimated: >60 mL/min (ref >60)
Glucose, Bld: 182 mg/dL — ABNORMAL HIGH (ref 70–99)
Potassium: 4.9 mmol/L (ref 3.5–5.1)
Sodium: 138 mmol/L (ref 135–145)
Total Bilirubin: 1 mg/dL (ref 0.3–1.2)
Total Protein: 6.2 g/dL — ABNORMAL LOW (ref 6.5–8.1)

## 2023-01-02 LAB — TSH: TSH: 4.082 u[IU]/mL (ref 0.350–4.500)

## 2023-01-02 LAB — CBC
HCT: 45.4 % (ref 39.0–52.0)
Hemoglobin: 14.8 g/dL (ref 13.0–17.0)
MCH: 32.8 pg (ref 26.0–34.0)
MCHC: 32.6 g/dL (ref 30.0–36.0)
MCV: 100.7 fL — ABNORMAL HIGH (ref 80.0–100.0)
Platelets: 180 10*3/uL (ref 150–400)
RBC: 4.51 MIL/uL (ref 4.22–5.81)
RDW: 13.1 % (ref 11.5–15.5)
WBC: 7.4 10*3/uL (ref 4.0–10.5)
nRBC: 0 % (ref 0.0–0.2)

## 2023-01-02 LAB — I-STAT CHEM 8, ED
BUN: 22 mg/dL (ref 8–23)
Calcium, Ion: 1.05 mmol/L — ABNORMAL LOW (ref 1.15–1.40)
Chloride: 106 mmol/L (ref 98–111)
Creatinine, Ser: 1 mg/dL (ref 0.61–1.24)
Glucose, Bld: 156 mg/dL — ABNORMAL HIGH (ref 70–99)
HCT: 44 % (ref 39.0–52.0)
Hemoglobin: 15 g/dL (ref 13.0–17.0)
Potassium: 4.7 mmol/L (ref 3.5–5.1)
Sodium: 140 mmol/L (ref 135–145)
TCO2: 26 mmol/L (ref 22–32)

## 2023-01-02 LAB — URINALYSIS, ROUTINE W REFLEX MICROSCOPIC
Bilirubin Urine: NEGATIVE
Glucose, UA: NEGATIVE mg/dL
Hgb urine dipstick: NEGATIVE
Ketones, ur: 5 mg/dL — AB
Leukocytes,Ua: NEGATIVE
Nitrite: NEGATIVE
Protein, ur: NEGATIVE mg/dL
Specific Gravity, Urine: 1.02 (ref 1.005–1.030)
pH: 8 (ref 5.0–8.0)

## 2023-01-02 LAB — SAMPLE TO BLOOD BANK

## 2023-01-02 LAB — PROTIME-INR
INR: 1.1 (ref 0.8–1.2)
Prothrombin Time: 14.3 seconds (ref 11.4–15.2)

## 2023-01-02 LAB — I-STAT CG4 LACTIC ACID, ED: Lactic Acid, Venous: 2.9 mmol/L (ref 0.5–1.9)

## 2023-01-02 LAB — TROPONIN I (HIGH SENSITIVITY)
Troponin I (High Sensitivity): 8 ng/L (ref <18)
Troponin I (High Sensitivity): 9 ng/L (ref <18)

## 2023-01-02 LAB — ETHANOL: Alcohol, Ethyl (B): <10 mg/dL (ref <10)

## 2023-01-02 MED ORDER — HYDRALAZINE HCL 20 MG/ML IJ SOLN
10.0000 mg | INTRAMUSCULAR | Status: DC | PRN
  Filled 2023-01-02: qty 1

## 2023-01-02 MED ORDER — CHLORHEXIDINE GLUCONATE CLOTH 2 % EX PADS
6.0000 | MEDICATED_PAD | Freq: Every day | CUTANEOUS | Status: DC
  Administered 2023-01-02 – 2023-01-10 (×9): 6 via TOPICAL

## 2023-01-02 MED ORDER — SODIUM CHLORIDE 0.9 % IV SOLN: INTRAVENOUS | Status: DC

## 2023-01-02 MED ORDER — ONDANSETRON 4 MG PO TBDP: 4.0000 mg | ORAL_TABLET | Freq: Four times a day (QID) | ORAL | Status: DC | PRN

## 2023-01-02 MED ORDER — METHOCARBAMOL 1000 MG/10ML IJ SOLN
500.0000 mg | Freq: Three times a day (TID) | INTRAVENOUS | Status: AC
  Administered 2023-01-02 – 2023-01-05 (×8): 500 mg via INTRAVENOUS
  Filled 2023-01-02 (×2): qty 500
  Filled 2023-01-02: qty 5
  Filled 2023-01-02 (×2): qty 500
  Filled 2023-01-02: qty 5
  Filled 2023-01-02 (×2): qty 500
  Filled 2023-01-02: qty 5
  Filled 2023-01-02: qty 500

## 2023-01-02 MED ORDER — DOCUSATE SODIUM 100 MG PO CAPS
100.0000 mg | ORAL_CAPSULE | Freq: Two times a day (BID) | ORAL | Status: DC
  Administered 2023-01-03 – 2023-01-10 (×14): 100 mg via ORAL
  Filled 2023-01-02 (×15): qty 1

## 2023-01-02 MED ORDER — SODIUM CHLORIDE 0.9 % IV BOLUS
1000.0000 mL | Freq: Once | INTRAVENOUS | Status: AC
  Administered 2023-01-02: 1000 mL via INTRAVENOUS

## 2023-01-02 MED ORDER — OXYCODONE HCL 5 MG PO TABS
5.0000 mg | ORAL_TABLET | ORAL | Status: DC | PRN
  Administered 2023-01-04 (×4): 5 mg via ORAL
  Filled 2023-01-02 (×4): qty 1

## 2023-01-02 MED ORDER — MORPHINE SULFATE (PF) 4 MG/ML IV SOLN
4.0000 mg | INTRAVENOUS | Status: DC | PRN
  Administered 2023-01-02 – 2023-01-05 (×7): 4 mg via INTRAVENOUS
  Filled 2023-01-02 (×7): qty 1

## 2023-01-02 MED ORDER — METHOCARBAMOL 500 MG PO TABS
500.0000 mg | ORAL_TABLET | Freq: Three times a day (TID) | ORAL | Status: AC
  Administered 2023-01-04: 500 mg via ORAL
  Filled 2023-01-02 (×3): qty 1

## 2023-01-02 MED ORDER — IOHEXOL 350 MG/ML SOLN
75.0000 mL | Freq: Once | INTRAVENOUS | Status: AC | PRN
  Administered 2023-01-02: 75 mL via INTRAVENOUS

## 2023-01-02 MED ORDER — ALBUMIN HUMAN 5 % IV SOLN
12.5000 g | Freq: Once | INTRAVENOUS | Status: AC
  Administered 2023-01-02: 12.5 g via INTRAVENOUS
  Filled 2023-01-02: qty 250

## 2023-01-02 MED ORDER — PANTOPRAZOLE SODIUM 40 MG PO TBEC
40.0000 mg | DELAYED_RELEASE_TABLET | Freq: Every day | ORAL | Status: DC
  Filled 2023-01-02: qty 1

## 2023-01-02 MED ORDER — ORAL CARE MOUTH RINSE: 15.0000 mL | OROMUCOSAL | Status: DC | PRN

## 2023-01-02 MED ORDER — LORAZEPAM 2 MG/ML IJ SOLN
1.0000 mg | Freq: Once | INTRAMUSCULAR | Status: AC
  Administered 2023-01-02: 1 mg via INTRAVENOUS
  Filled 2023-01-02: qty 1

## 2023-01-02 MED ORDER — PANTOPRAZOLE SODIUM 40 MG IV SOLR
40.0000 mg | Freq: Every day | INTRAVENOUS | Status: DC
  Administered 2023-01-02 – 2023-01-03 (×2): 40 mg via INTRAVENOUS
  Filled 2023-01-02 (×2): qty 10

## 2023-01-02 MED ORDER — ACETAMINOPHEN 500 MG PO TABS
1000.0000 mg | ORAL_TABLET | Freq: Four times a day (QID) | ORAL | Status: DC
  Administered 2023-01-03 – 2023-01-08 (×17): 1000 mg via ORAL
  Filled 2023-01-02 (×19): qty 2

## 2023-01-02 MED ORDER — ONDANSETRON HCL 4 MG/2ML IJ SOLN
4.0000 mg | Freq: Four times a day (QID) | INTRAMUSCULAR | Status: DC | PRN
  Administered 2023-01-02 – 2023-01-07 (×2): 4 mg via INTRAVENOUS
  Filled 2023-01-02 (×2): qty 2

## 2023-01-02 MED ORDER — POLYETHYLENE GLYCOL 3350 17 G PO PACK
17.0000 g | PACK | Freq: Every day | ORAL | Status: DC | PRN
  Administered 2023-01-05 – 2023-01-07 (×4): 17 g via ORAL
  Filled 2023-01-02 (×4): qty 1
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
## 2023-01-02 NOTE — ED Notes (Signed)
Dr. Thompson at bedside. 
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
## 2023-01-02 NOTE — ED Notes (Addendum)
Wife and grandson at bedside; Dr. Janee Morn at bedside
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
## 2023-01-02 NOTE — ED Notes (Signed)
Pt returned from CT °

## 2023-01-02 NOTE — ED Notes (Signed)
Pt given incentive spirometer and educated about proper use. Pt demonstrated proper use.
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
## 2023-01-02 NOTE — ED Notes (Signed)
Pt back from MRI 

## 2023-01-02 NOTE — ED Notes (Signed)
Patient transported to CT 
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
## 2023-01-02 NOTE — ED Notes (Signed)
Dr. Cabbell at bedside 
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
## 2023-01-02 NOTE — Progress Notes (Signed)
Patient ID: Edward Waller, male   DOB: [birth year], 89 y.o.   MRN: [id number] Mri reviewed. Will need percutaneous T7-10 orif, plan for tomorrow. May eat today.

## 2023-01-02 NOTE — ED Notes (Signed)
Patient transported to MRI 
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
## 2023-01-03 ENCOUNTER — Other Ambulatory Visit: Payer: Self-pay

## 2023-01-03 ENCOUNTER — Inpatient Hospital Stay (HOSPITAL_COMMUNITY): Payer: No Typology Code available for payment source

## 2023-01-03 ENCOUNTER — Inpatient Hospital Stay (HOSPITAL_COMMUNITY): Payer: No Typology Code available for payment source | Admitting: Anesthesiology

## 2023-01-03 ENCOUNTER — Encounter (HOSPITAL_COMMUNITY): Admission: EM | Disposition: A | Discharge status: Rehabilitation Facility | Payer: Self-pay | Source: Home / Self Care

## 2023-01-03 ENCOUNTER — Encounter (HOSPITAL_COMMUNITY): Payer: Self-pay | Admitting: General Surgery

## 2023-01-03 DIAGNOSIS — S23150A Subluxation of T8/T9 thoracic vertebra, initial encounter: Secondary | ICD-10-CM | POA: Diagnosis present

## 2023-01-03 DIAGNOSIS — E039 Hypothyroidism, unspecified: Secondary | ICD-10-CM

## 2023-01-03 HISTORY — PX: LAMINECTOMY WITH POSTERIOR LATERAL ARTHRODESIS LEVEL 3: SHX6337

## 2023-01-03 LAB — BASIC METABOLIC PANEL
Anion gap: 9 (ref 5–15)
BUN: 15 mg/dL (ref 8–23)
CO2: 21 mmol/L — ABNORMAL LOW (ref 22–32)
Calcium: 8.2 mg/dL — ABNORMAL LOW (ref 8.9–10.3)
Chloride: 106 mmol/L (ref 98–111)
Creatinine, Ser: 0.82 mg/dL (ref 0.61–1.24)
GFR, Estimated: >60 mL/min (ref >60)
Glucose, Bld: 126 mg/dL — ABNORMAL HIGH (ref 70–99)
Potassium: 5.1 mmol/L (ref 3.5–5.1)
Sodium: 136 mmol/L (ref 135–145)

## 2023-01-03 LAB — CBC
HCT: 46.9 % (ref 39.0–52.0)
Hemoglobin: 15.7 g/dL (ref 13.0–17.0)
MCH: 33.1 pg (ref 26.0–34.0)
MCHC: 33.5 g/dL (ref 30.0–36.0)
MCV: 98.7 fL (ref 80.0–100.0)
Platelets: 164 10*3/uL (ref 150–400)
RBC: 4.75 MIL/uL (ref 4.22–5.81)
RDW: 13.2 % (ref 11.5–15.5)
WBC: 11 10*3/uL — ABNORMAL HIGH (ref 4.0–10.5)
nRBC: 0 % (ref 0.0–0.2)

## 2023-01-03 LAB — MRSA NEXT GEN BY PCR, NASAL: MRSA by PCR Next Gen: NOT DETECTED

## 2023-01-03 LAB — TYPE AND SCREEN
ABO/RH(D): O POS
Antibody Screen: NEGATIVE

## 2023-01-03 LAB — ABO/RH: ABO/RH(D): O POS

## 2023-01-03 SURGERY — LAMINECTOMY WITH POSTERIOR LATERAL ARTHRODESIS LEVEL 3
Anesthesia: General | Site: Spine Thoracic

## 2023-01-03 MED ORDER — CEFAZOLIN SODIUM-DEXTROSE 1-4 GM/50ML-% IV SOLN
INTRAVENOUS | Status: DC | PRN
Start: 2023-01-03 — End: 2023-01-03
  Administered 2023-01-03: 1 g via INTRAVENOUS

## 2023-01-03 MED ORDER — PROMETHAZINE HCL 25 MG/ML IJ SOLN: 6.2500 mg | INTRAMUSCULAR | Status: DC | PRN

## 2023-01-03 MED ORDER — LIDOCAINE 2% (20 MG/ML) 5 ML SYRINGE
INTRAMUSCULAR | Status: AC
  Filled 2023-01-03: qty 5

## 2023-01-03 MED ORDER — LIDOCAINE 2% (20 MG/ML) 5 ML SYRINGE
INTRAMUSCULAR | Status: DC | PRN
  Administered 2023-01-03: 20 mg via INTRAVENOUS

## 2023-01-03 MED ORDER — DEXAMETHASONE SODIUM PHOSPHATE 10 MG/ML IJ SOLN
INTRAMUSCULAR | Status: DC | PRN
  Administered 2023-01-03: 10 mg via INTRAVENOUS

## 2023-01-03 MED ORDER — PHENYLEPHRINE 80 MCG/ML (10ML) SYRINGE FOR IV PUSH (FOR BLOOD PRESSURE SUPPORT)
PREFILLED_SYRINGE | INTRAVENOUS | Status: AC
  Filled 2023-01-03: qty 10

## 2023-01-03 MED ORDER — PROPOFOL 10 MG/ML IV BOLUS
INTRAVENOUS | Status: AC
  Filled 2023-01-03: qty 20

## 2023-01-03 MED ORDER — PHENYLEPHRINE HCL-NACL 20-0.9 MG/250ML-% IV SOLN
INTRAVENOUS | Status: DC | PRN
  Administered 2023-01-03: 40 ug/min via INTRAVENOUS

## 2023-01-03 MED ORDER — HYDROMORPHONE HCL 1 MG/ML IJ SOLN: 0.2500 mg | INTRAMUSCULAR | Status: DC | PRN

## 2023-01-03 MED ORDER — FENTANYL CITRATE (PF) 250 MCG/5ML IJ SOLN
INTRAMUSCULAR | Status: AC
  Filled 2023-01-03: qty 5

## 2023-01-03 MED ORDER — PROPOFOL 10 MG/ML IV BOLUS
INTRAVENOUS | Status: DC | PRN
  Administered 2023-01-03: 80 mg via INTRAVENOUS

## 2023-01-03 MED ORDER — CHLORHEXIDINE GLUCONATE CLOTH 2 % EX PADS
6.0000 | MEDICATED_PAD | Freq: Once | CUTANEOUS | Status: AC
  Administered 2023-01-03: 6 via TOPICAL

## 2023-01-03 MED ORDER — CHLORHEXIDINE GLUCONATE CLOTH 2 % EX PADS: 6.0000 | MEDICATED_PAD | Freq: Once | CUTANEOUS | Status: AC

## 2023-01-03 MED ORDER — EPHEDRINE SULFATE-NACL 50-0.9 MG/10ML-% IV SOSY
PREFILLED_SYRINGE | INTRAVENOUS | Status: DC | PRN
  Administered 2023-01-03 (×2): 5 mg via INTRAVENOUS

## 2023-01-03 MED ORDER — ROCURONIUM BROMIDE 10 MG/ML (PF) SYRINGE
PREFILLED_SYRINGE | INTRAVENOUS | Status: DC | PRN
  Administered 2023-01-03: 60 mg via INTRAVENOUS

## 2023-01-03 MED ORDER — THROMBIN 20000 UNITS EX SOLR: CUTANEOUS | Status: DC | PRN

## 2023-01-03 MED ORDER — LEVOTHYROXINE SODIUM 50 MCG PO TABS
50.0000 ug | ORAL_TABLET | Freq: Every day | ORAL | Status: DC
  Administered 2023-01-04 – 2023-01-10 (×7): 50 ug via ORAL
  Filled 2023-01-03 (×7): qty 1

## 2023-01-03 MED ORDER — CALCIUM GLUCONATE-NACL 2-0.675 GM/100ML-% IV SOLN
2.0000 g | Freq: Once | INTRAVENOUS | Status: AC
  Administered 2023-01-03: 2000 mg via INTRAVENOUS
  Filled 2023-01-03: qty 100

## 2023-01-03 MED ORDER — 0.9 % SODIUM CHLORIDE (POUR BTL) OPTIME
TOPICAL | Status: DC | PRN
  Administered 2023-01-03: 1000 mL

## 2023-01-03 MED ORDER — LACTATED RINGERS IV SOLN: INTRAVENOUS | Status: DC

## 2023-01-03 MED ORDER — MIDAZOLAM HCL 2 MG/2ML IJ SOLN: 0.5000 mg | Freq: Once | INTRAMUSCULAR | Status: DC | PRN

## 2023-01-03 MED ORDER — OXYCODONE HCL 5 MG/5ML PO SOLN: 5.0000 mg | Freq: Once | ORAL | Status: DC | PRN

## 2023-01-03 MED ORDER — MEPERIDINE HCL 25 MG/ML IJ SOLN: 6.2500 mg | INTRAMUSCULAR | Status: DC | PRN

## 2023-01-03 MED ORDER — ONDANSETRON HCL 4 MG/2ML IJ SOLN
INTRAMUSCULAR | Status: DC | PRN
  Administered 2023-01-03: 4 mg via INTRAVENOUS

## 2023-01-03 MED ORDER — CHLORHEXIDINE GLUCONATE 0.12 % MT SOLN
OROMUCOSAL | Status: AC
  Administered 2023-01-03: 15 mL via OROMUCOSAL
  Filled 2023-01-03: qty 15

## 2023-01-03 MED ORDER — ACETAMINOPHEN 10 MG/ML IV SOLN
INTRAVENOUS | Status: AC
  Filled 2023-01-03: qty 100

## 2023-01-03 MED ORDER — CHLORHEXIDINE GLUCONATE 0.12 % MT SOLN: 15.0000 mL | Freq: Once | OROMUCOSAL | Status: AC

## 2023-01-03 MED ORDER — DEXAMETHASONE SODIUM PHOSPHATE 10 MG/ML IJ SOLN
INTRAMUSCULAR | Status: AC
  Filled 2023-01-03: qty 1

## 2023-01-03 MED ORDER — PHENYLEPHRINE 80 MCG/ML (10ML) SYRINGE FOR IV PUSH (FOR BLOOD PRESSURE SUPPORT)
PREFILLED_SYRINGE | INTRAVENOUS | Status: DC | PRN
  Administered 2023-01-03 (×2): 160 ug via INTRAVENOUS

## 2023-01-03 MED ORDER — LIDOCAINE-EPINEPHRINE 0.5 %-1:200000 IJ SOLN
INTRAMUSCULAR | Status: AC
  Filled 2023-01-03: qty 50

## 2023-01-03 MED ORDER — ROCURONIUM BROMIDE 10 MG/ML (PF) SYRINGE
PREFILLED_SYRINGE | INTRAVENOUS | Status: AC
  Filled 2023-01-03: qty 10

## 2023-01-03 MED ORDER — ORAL CARE MOUTH RINSE: 15.0000 mL | Freq: Once | OROMUCOSAL | Status: AC

## 2023-01-03 MED ORDER — LIDOCAINE-EPINEPHRINE 0.5 %-1:200000 IJ SOLN
INTRAMUSCULAR | Status: DC | PRN
  Administered 2023-01-03: 17 mL

## 2023-01-03 MED ORDER — EPHEDRINE 5 MG/ML INJ
INTRAVENOUS | Status: AC
  Filled 2023-01-03: qty 5

## 2023-01-03 MED ORDER — THROMBIN 20000 UNITS EX SOLR
CUTANEOUS | Status: AC
  Filled 2023-01-03: qty 20000

## 2023-01-03 MED ORDER — ONDANSETRON HCL 4 MG/2ML IJ SOLN
INTRAMUSCULAR | Status: AC
  Filled 2023-01-03: qty 2

## 2023-01-03 MED ORDER — CEFAZOLIN SODIUM-DEXTROSE 2-4 GM/100ML-% IV SOLN
2.0000 g | INTRAVENOUS | Status: AC
  Administered 2023-01-03: 2 g via INTRAVENOUS
  Filled 2023-01-03: qty 100

## 2023-01-03 MED ORDER — FENTANYL CITRATE (PF) 250 MCG/5ML IJ SOLN
INTRAMUSCULAR | Status: DC | PRN
  Administered 2023-01-03 (×2): 50 ug via INTRAVENOUS
  Administered 2023-01-03: 200 ug via INTRAVENOUS

## 2023-01-03 MED ORDER — OXYCODONE HCL 5 MG PO TABS: 5.0000 mg | ORAL_TABLET | Freq: Once | ORAL | Status: DC | PRN

## 2023-01-03 SURGICAL SUPPLY — 59 items
ADH SKN CLS APL DERMABOND .7 (GAUZE/BANDAGES/DRESSINGS) ×2
APL SKNCLS STERI-STRIP NONHPOA (GAUZE/BANDAGES/DRESSINGS)
BAG COUNTER SPONGE SURGICOUNT (BAG) ×1 IMPLANT
BAG SPNG CNTER NS LX DISP (BAG) ×1
BENZOIN TINCTURE PRP APPL 2/3 (GAUZE/BANDAGES/DRESSINGS) IMPLANT
BLADE CLIPPER SURG (BLADE) IMPLANT
BUR MATCHSTICK NEURO 3.0 LAGG (BURR) ×1 IMPLANT
BUR PRECISION FLUTE 5.0 (BURR) ×1 IMPLANT
CANISTER SUCT 3000ML PPV (MISCELLANEOUS) ×1 IMPLANT
CNTNR URN SCR LID CUP LEK RST (MISCELLANEOUS) ×1 IMPLANT
CONT SPEC 4OZ STRL OR WHT (MISCELLANEOUS) ×1
COVER BACK TABLE 60X90IN (DRAPES) IMPLANT
DERMABOND ADVANCED .7 DNX12 (GAUZE/BANDAGES/DRESSINGS) ×1 IMPLANT
DRAPE C-ARM 42X72 X-RAY (DRAPES) ×2 IMPLANT
DRAPE C-ARMOR (DRAPES) IMPLANT
DRAPE LAPAROTOMY 100X72X124 (DRAPES) ×1 IMPLANT
DRAPE SURG 17X23 STRL (DRAPES) ×1 IMPLANT
DRSG OPSITE POSTOP 4X6 (GAUZE/BANDAGES/DRESSINGS) IMPLANT
DURAPREP 26ML APPLICATOR (WOUND CARE) ×1 IMPLANT
ELECT REM PT RETURN 9FT ADLT (ELECTROSURGICAL) ×1
ELECTRODE REM PT RTRN 9FT ADLT (ELECTROSURGICAL) ×1 IMPLANT
GAUZE 4X4 16PLY ~~LOC~~+RFID DBL (SPONGE) IMPLANT
GAUZE SPONGE 4X4 12PLY STRL (GAUZE/BANDAGES/DRESSINGS) IMPLANT
GLOVE ECLIPSE 6.5 STRL STRAW (GLOVE) ×2 IMPLANT
GLOVE EXAM NITRILE XL STR (GLOVE) IMPLANT
GOWN STRL REUS W/ TWL LRG LVL3 (GOWN DISPOSABLE) ×2 IMPLANT
GOWN STRL REUS W/ TWL XL LVL3 (GOWN DISPOSABLE) IMPLANT
GOWN STRL REUS W/TWL 2XL LVL3 (GOWN DISPOSABLE) IMPLANT
GOWN STRL REUS W/TWL LRG LVL3 (GOWN DISPOSABLE) ×2
GOWN STRL REUS W/TWL XL LVL3 (GOWN DISPOSABLE)
GUIDEWIRE NITI 1.4X495 2-PK (WIRE) IMPLANT
KIT BASIN OR (CUSTOM PROCEDURE TRAY) ×1 IMPLANT
KIT POSITION SURG JACKSON T1 (MISCELLANEOUS) ×1 IMPLANT
KIT TURNOVER KIT B (KITS) ×1 IMPLANT
NDL BIOPSY DD SERENGETI 8G (NEEDLE) IMPLANT
NDL HYPO 25X1 1.5 SAFETY (NEEDLE) ×1 IMPLANT
NDL SPNL 18GX3.5 QUINCKE PK (NEEDLE) IMPLANT
NEEDLE BIOPSY DD SERENGETI 8G (NEEDLE) ×2 IMPLANT
NEEDLE HYPO 25X1 1.5 SAFETY (NEEDLE) ×1 IMPLANT
NEEDLE SPNL 18GX3.5 QUINCKE PK (NEEDLE) IMPLANT
NS IRRIG 1000ML POUR BTL (IV SOLUTION) ×1 IMPLANT
PACK LAMINECTOMY NEURO (CUSTOM PROCEDURE TRAY) ×1 IMPLANT
PAD ARMBOARD 7.5X6 YLW CONV (MISCELLANEOUS) ×3 IMPLANT
ROD CONT BN 5.5X70 (Rod) IMPLANT
SCREW CANN PA EVEREST 7.5X40 (Screw) IMPLANT
SCREW PA FENS EVEREST 6.5X40 (Screw) IMPLANT
SET SCREW (Screw) ×6 IMPLANT
SET SCREW VRST (Screw) IMPLANT
SPIKE FLUID TRANSFER (MISCELLANEOUS) ×1 IMPLANT
SPONGE SURGIFOAM ABS GEL 100 (HEMOSTASIS) ×1 IMPLANT
SPONGE T-LAP 4X18 ~~LOC~~+RFID (SPONGE) IMPLANT
STRIP CLOSURE SKIN 1/2X4 (GAUZE/BANDAGES/DRESSINGS) IMPLANT
SUT VIC AB 0 CT1 18XCR BRD8 (SUTURE) ×1 IMPLANT
SUT VIC AB 0 CT1 8-18 (SUTURE) ×1
SUT VIC AB 2-0 CT1 18 (SUTURE) ×1 IMPLANT
SUT VIC AB 3-0 SH 8-18 (SUTURE) ×1 IMPLANT
TOWEL GREEN STERILE (TOWEL DISPOSABLE) ×1 IMPLANT
TOWEL GREEN STERILE FF (TOWEL DISPOSABLE) ×1 IMPLANT
WATER STERILE IRR 1000ML POUR (IV SOLUTION) ×1 IMPLANT

## 2023-01-03 NOTE — TOC CAGE-AID Note (Signed)
Transition of Care [facility]) - CAGE-AID Screening   Patient Details  Name: Edward Waller MRN: [id number] Date of Birth: 1933-02-13  Transition of Care [facility]) CM/SW Contact:    Mearl Latin, LCSW Phone Number: 01/03/2023, 9:45 AM   Clinical Narrative: Patient with no ETOH or substance use. Substance use resources not offered.    CAGE-AID Screening:    Have You Ever Felt You Ought to Cut Down on Your Drinking or Drug Use?: No Have People Annoyed You By Critizing Your Drinking Or Drug Use?: No Have You Felt Bad Or Guilty About Your Drinking Or Drug Use?: No Have You Ever Had a Drink or Used Drugs First Thing In The Morning to Steady Your Nerves or to Get Rid of a Hangover?: No CAGE-AID Score: 0  Substance Abuse Education Offered: No (Not indicated)

## 2023-01-03 NOTE — Transfer of Care (Signed)
Immediate Anesthesia Transfer of Care Note  Patient: Edward Waller  Procedure(s) Performed: OPEN REDUCTION INTERNAL FIXATION THORACIC SEVEN -THORACIC NINE (Spine Thoracic)  Patient Location: PACU  Anesthesia Type:General  Level of Consciousness: drowsy, patient cooperative, and responds to stimulation  Airway & Oxygen Therapy: Patient Spontanous Breathing and Patient connected to nasal cannula oxygen  Post-op Assessment: Report given to RN and Post -op Vital signs reviewed and stable  Post vital signs: Reviewed and stable  Last Vitals:  Vitals Value Taken Time  BP 125/65 01/03/23 2136  Temp    Pulse 64 01/03/23 2140  Resp 14 01/03/23 2140  SpO2 98 % 01/03/23 2140  Vitals shown include unfiled device data.  Last Pain:  Vitals:   01/03/23 1905  TempSrc:   PainSc: 3          Complications: No notable events documented.
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
## 2023-01-03 NOTE — Progress Notes (Signed)
Patient ID: Edward Waller, male   DOB: Jul 30, 1932, 89 y.o.   MRN: [id number] BP 133/66   Pulse 73   Temp 98.4 F (36.9 C)   Resp 17   Ht 6' (1.829 m)   Wt 72.6 kg   SpO2 93%   BMI 21.70 kg/m  Alert and oriented x 4. 0/5 lower extremities. Understands the need for spinal stabilization. I have proposed percutaneous screw placement to fixate and reduce the spine. I have explained the risks and benefits of the procedure, spinal cord damage, hardware failure, bleeding and infection, need for more surgery, and other risks. He wishes to proceed.
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
## 2023-01-03 NOTE — Anesthesia Postprocedure Evaluation (Signed)
Anesthesia Post Note  Patient: Edward Waller  Procedure(s) Performed: OPEN REDUCTION INTERNAL FIXATION THORACIC SEVEN -THORACIC NINE (Spine Thoracic)     Patient location during evaluation: PACU Anesthesia Type: General Level of consciousness: awake and alert, patient cooperative and oriented Pain management: pain level controlled Vital Signs Assessment: post-procedure vital signs reviewed and stable Respiratory status: spontaneous breathing, nonlabored ventilation, respiratory function stable and patient connected to nasal cannula oxygen Cardiovascular status: blood pressure returned to baseline and stable Postop Assessment: no apparent nausea or vomiting Anesthetic complications: no   No notable events documented.  Last Vitals:  Vitals:   01/03/23 2155 01/03/23 2210  BP: (!) 142/59 (!) 126/96  Pulse: 68 61  Resp: 18 10  Temp:    SpO2: 95% 94%    Last Pain:  Vitals:   01/03/23 2210  TempSrc:   PainSc: Asleep    LLE Motor Response: No movement to painful stimulus (01/03/23 2210) LLE Sensation: No sensation (absent) (01/03/23 2210) RLE Motor Response: No movement to painful stimulus (01/03/23 2210) RLE Sensation: No sensation (absent) (01/03/23 2210)      Dawnell Bryant,E. Nefertari Rebman

## 2023-01-03 NOTE — Anesthesia Procedure Notes (Signed)
Procedure Name: Intubation Date/Time: 01/03/2023 7:40 PM  Performed by: Randon Goldsmith, CRNAPre-anesthesia Checklist: Patient identified, Emergency Drugs available, Suction available and Patient being monitored Patient Re-evaluated:Patient Re-evaluated prior to induction Oxygen Delivery Method: Circle system utilized Preoxygenation: Pre-oxygenation with 100% oxygen Induction Type: IV induction Ventilation: Mask ventilation without difficulty Laryngoscope Size: Glidescope and 4 Grade View: Grade I Tube type: Oral Tube size: 7.5 mm Number of attempts: 1 Airway Equipment and Method: Stylet and Oral airway Placement Confirmation: ETT inserted through vocal cords under direct vision, positive ETCO2 and breath sounds checked- equal and bilateral Secured at: 22 cm Tube secured with: Tape Dental Injury: Teeth and Oropharynx as per pre-operative assessment

## 2023-01-04 ENCOUNTER — Encounter (HOSPITAL_COMMUNITY): Payer: Self-pay | Admitting: Neurosurgery

## 2023-01-04 DIAGNOSIS — F4320 Adjustment disorder, unspecified: Secondary | ICD-10-CM

## 2023-01-04 LAB — CBC
HCT: 43.7 % (ref 39.0–52.0)
Hemoglobin: 14.3 g/dL (ref 13.0–17.0)
MCH: 31.6 pg (ref 26.0–34.0)
MCHC: 32.7 g/dL (ref 30.0–36.0)
MCV: 96.7 fL (ref 80.0–100.0)
Platelets: 146 10*3/uL — ABNORMAL LOW (ref 150–400)
RBC: 4.52 MIL/uL (ref 4.22–5.81)
RDW: 13.1 % (ref 11.5–15.5)
WBC: 9.8 10*3/uL (ref 4.0–10.5)
nRBC: 0 % (ref 0.0–0.2)

## 2023-01-04 LAB — BASIC METABOLIC PANEL
Anion gap: 6 (ref 5–15)
BUN: 12 mg/dL (ref 8–23)
CO2: 25 mmol/L (ref 22–32)
Calcium: 8.4 mg/dL — ABNORMAL LOW (ref 8.9–10.3)
Chloride: 106 mmol/L (ref 98–111)
Creatinine, Ser: 0.76 mg/dL (ref 0.61–1.24)
GFR, Estimated: >60 mL/min (ref >60)
Glucose, Bld: 171 mg/dL — ABNORMAL HIGH (ref 70–99)
Potassium: 4.1 mmol/L (ref 3.5–5.1)
Sodium: 137 mmol/L (ref 135–145)

## 2023-01-04 NOTE — Progress Notes (Signed)
Inpatient Rehab Admissions Coordinator:   Per therapy recommendations, patient was screened for CIR candidacy by Megan Salon, MS, CCC-SLP  . At this time, Pt. is not yet attempting transfers (eval limited due to not having back brace yet), and has not yet demonstrated ability to tolerate the intensity of CIR;however,  Pt. may have potential to progress to becoming a potential CIR candidate, so CIR admissions team will follow and monitor for progress and participation with therapies and place consult order if Pt. appears to be an appropriate candidate. Please contact me with any questions.   Megan Salon, MS, CCC-SLP Rehab Admissions Coordinator  [phone] (celll) [phone] (office)
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
## 2023-01-04 NOTE — Progress Notes (Signed)
Orthopedic Tech Progress Note Patient Details:  Edward Waller 1932-12-18 [id number]  Ortho Devices Type of Ortho Device: Thoracolumbar corset (TLSO) Ortho Device/Splint Location: adjusted to pt, at bedside for use when OOB Ortho Device/Splint Interventions: Ordered, Adjustment   Post Interventions Instructions Provided: Care of device, Adjustment of device  [name] [name] 01/04/2023, 12:01 PM
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
## 2023-01-04 NOTE — Progress Notes (Signed)
   Providing Compassionate, Quality Care - Together  NEUROSURGERY PROGRESS NOTE   S: No issues overnight.   O: EXAM:  BP (!) 152/88   Pulse 76   Temp 98 F (36.7 C) (Oral)   Resp (!) 23   Ht 6' (1.829 m)   Wt 72.6 kg   SpO2 95%   BMI 21.70 kg/m   Awake, alert, oriented to 3 PERRL Speech fluent, appropriate  CNs grossly intact  5/5 BUE 0/5 bilateral lower extremities  ASSESSMENT:  89 y.o. male with   T8 fracture subluxation with complete spinal cord injury; status post T7-9 instrumentation/fusion  PLAN: -Continue therapies -Pain control -Family updated at bedside    Thank you for allowing me to participate in this patient's care.  Please do not hesitate to call with questions or concerns.   Monia Pouch, DO Neurosurgeon [facility] [phone]

## 2023-01-05 LAB — BASIC METABOLIC PANEL
Anion gap: 3 — ABNORMAL LOW (ref 5–15)
BUN: 18 mg/dL (ref 8–23)
CO2: 24 mmol/L (ref 22–32)
Calcium: 7.8 mg/dL — ABNORMAL LOW (ref 8.9–10.3)
Chloride: 108 mmol/L (ref 98–111)
Creatinine, Ser: 0.91 mg/dL (ref 0.61–1.24)
GFR, Estimated: >60 mL/min (ref >60)
Glucose, Bld: 128 mg/dL — ABNORMAL HIGH (ref 70–99)
Potassium: 3.6 mmol/L (ref 3.5–5.1)
Sodium: 135 mmol/L (ref 135–145)

## 2023-01-05 LAB — CBC
HCT: 36.7 % — ABNORMAL LOW (ref 39.0–52.0)
Hemoglobin: 12.1 g/dL — ABNORMAL LOW (ref 13.0–17.0)
MCH: 32.4 pg (ref 26.0–34.0)
MCHC: 33 g/dL (ref 30.0–36.0)
MCV: 98.1 fL (ref 80.0–100.0)
Platelets: 146 10*3/uL — ABNORMAL LOW (ref 150–400)
RBC: 3.74 MIL/uL — ABNORMAL LOW (ref 4.22–5.81)
RDW: 13.2 % (ref 11.5–15.5)
WBC: 7.7 10*3/uL (ref 4.0–10.5)
nRBC: 0 % (ref 0.0–0.2)

## 2023-01-05 MED ORDER — MELATONIN 3 MG PO TABS
3.0000 mg | ORAL_TABLET | Freq: Every day | ORAL | Status: DC
  Administered 2023-01-06 (×2): 3 mg via ORAL
  Filled 2023-01-05 (×2): qty 1
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
## 2023-01-05 NOTE — Progress Notes (Signed)
   Providing Compassionate, Quality Care - Together  NEUROSURGERY PROGRESS NOTE   S: No issues overnight.   O: EXAM:  BP 125/79   Pulse 78   Temp 98.2 F (36.8 C) (Oral)   Resp 18   Ht 6' (1.829 m)   Wt 72.6 kg   SpO2 94%   BMI 21.70 kg/m   Awake, alert, oriented to 3 PERRL Speech fluent, appropriate  CNs grossly intact  5/5 BUE 0/5 bilateral lower extremities   ASSESSMENT:  89 y.o. male with    T8 fracture subluxation with complete spinal cord injury; status post T7-9 instrumentation/fusion   PLAN: -Continue therapies -Family updated at bedside    Thank you for allowing me to participate in this patient's care.  Please do not hesitate to call with questions or concerns.   Monia Pouch, DO Neurosurgeon [facility] [phone]

## 2023-01-05 NOTE — Plan of Care (Signed)

## 2023-01-06 LAB — CBC
HCT: 38 % — ABNORMAL LOW (ref 39.0–52.0)
Hemoglobin: 12.7 g/dL — ABNORMAL LOW (ref 13.0–17.0)
MCH: 32 pg (ref 26.0–34.0)
MCHC: 33.4 g/dL (ref 30.0–36.0)
MCV: 95.7 fL (ref 80.0–100.0)
Platelets: 161 10*3/uL (ref 150–400)
RBC: 3.97 MIL/uL — ABNORMAL LOW (ref 4.22–5.81)
RDW: 13.2 % (ref 11.5–15.5)
WBC: 6.2 10*3/uL (ref 4.0–10.5)
nRBC: 0 % (ref 0.0–0.2)

## 2023-01-06 LAB — BASIC METABOLIC PANEL
Anion gap: 10 (ref 5–15)
BUN: 16 mg/dL (ref 8–23)
CO2: 24 mmol/L (ref 22–32)
Calcium: 8.2 mg/dL — ABNORMAL LOW (ref 8.9–10.3)
Chloride: 105 mmol/L (ref 98–111)
Creatinine, Ser: 0.7 mg/dL (ref 0.61–1.24)
GFR, Estimated: >60 mL/min (ref >60)
Glucose, Bld: 136 mg/dL — ABNORMAL HIGH (ref 70–99)
Potassium: 4 mmol/L (ref 3.5–5.1)
Sodium: 139 mmol/L (ref 135–145)

## 2023-01-06 MED ORDER — BISACODYL 10 MG RE SUPP
10.0000 mg | Freq: Every day | RECTAL | Status: DC
  Administered 2023-01-06 – 2023-01-10 (×5): 10 mg via RECTAL
  Filled 2023-01-06 (×5): qty 1

## 2023-01-06 MED ORDER — FLEET ENEMA RE ENEM
1.0000 | ENEMA | Freq: Once | RECTAL | Status: AC
  Administered 2023-01-06: 1 via RECTAL
  Filled 2023-01-06: qty 1

## 2023-01-06 NOTE — Plan of Care (Signed)

## 2023-01-06 NOTE — Progress Notes (Signed)
Patient ID: Edward Waller, male   DOB: Jul 09, 1932, 89 y.o.   MRN: [id number] BP 108/79 (BP Location: Right Arm)   Pulse 66   Temp 97.9 F (36.6 C) (Oral)   Resp (!) 21   Ht 6' (1.829 m)   Wt 72.6 kg   SpO2 94%   BMI 21.70 kg/m  Alert and oriented No movement lower extremities Has had psych consulted Mood better today
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
## 2023-01-06 NOTE — Progress Notes (Signed)
Enema administered.  No BM.  Suppository administered.  No BM and no stool impaction noted in rectum.  Will continue to monitor.
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
## 2023-01-06 NOTE — Progress Notes (Signed)
Inpatient Rehab Admissions Coordinator:  ? ?Per therapy recommendations,  patient was screened for CIR candidacy by Megan Salon, MS, CCC-SLP. At this time, Pt. Appears to be a a potential candidate for CIR. I will place   order for rehab consult per protocol for full assessment. Please contact me any with questions. ? ?Megan Salon, MS, CCC-SLP ?Rehab Admissions Coordinator  ?[phone] (celll) ?[phone] (office) ? ?

## 2023-01-06 NOTE — Progress Notes (Signed)
PT Cancellation Note  Patient Details Name: Edward Waller MRN: [id number] DOB: Oct 15, 1932   Cancelled Treatment:    Reason Eval/Treat Not Completed: Patient declined, no reason specified. Pt declines PT intervention at this time, would prefer to remain resting in the recliner. PT will follow up as time allows.   Arlyss Gandy 01/06/2023, 1:34 PM

## 2023-01-06 NOTE — Plan of Care (Signed)

## 2023-01-07 ENCOUNTER — Encounter (HOSPITAL_COMMUNITY): Payer: Self-pay | Admitting: Neurosurgery

## 2023-01-07 DIAGNOSIS — G8221 Paraplegia, complete: Secondary | ICD-10-CM

## 2023-01-07 MED ORDER — MAGNESIUM CITRATE PO SOLN
0.5000 | Freq: Once | ORAL | Status: AC
  Administered 2023-01-07: 0.5 via ORAL
  Filled 2023-01-07: qty 296

## 2023-01-07 MED ORDER — POLYETHYLENE GLYCOL 3350 17 G PO PACK
17.0000 g | PACK | Freq: Two times a day (BID) | ORAL | Status: DC
  Administered 2023-01-07 – 2023-01-10 (×6): 17 g via ORAL
  Filled 2023-01-07 (×6): qty 1

## 2023-01-07 MED ORDER — MELATONIN 5 MG PO TABS
10.0000 mg | ORAL_TABLET | Freq: Every day | ORAL | Status: DC
  Administered 2023-01-07 – 2023-01-09 (×3): 10 mg via ORAL
  Filled 2023-01-07 (×3): qty 2

## 2023-01-07 NOTE — Progress Notes (Signed)
Inpatient Rehab Coordinator Note:  I met with patient at bedside to discuss CIR recommendations and goals/expectations of CIR stay.  We reviewed 3 hrs/day of therapy, physician follow up, and average length of stay 2 weeks (dependent upon progress) with goals likely w/c level with assist for all mobility.  Pt relies heavily on his faith and feels that god will heal him, but understands that he may never "fully recover".  We discussed that I would expect a rehab stay of around 4 weeks and near certainty of w/c level goals at that time.  Pt with fair upper body strength, per PT discussion, and may need min to mod assist for mobility/ADLs.  Await consult from Dr. Berline Chough to finalize goals and ELOS and discuss with pt/family.    Estill Dooms, PT, DPT Admissions Coordinator [phone] 01/07/23  1:23 PM

## 2023-01-07 NOTE — Plan of Care (Signed)
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
## 2023-01-07 NOTE — Progress Notes (Signed)
   01/07/23 1539  Spiritual Encounters  Type of Visit Initial  Care provided to: Pt and family  Conversation partners present during encounter Nurse  Reason for visit Advance directives  OnCall Visit No   Chaplain visited Pt in order to deliver ACD education.  After delivering education and paperwork, chaplain engaged Pt is conversation.  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  Pt spoke much about his faith, and his 59 year marriage to his wife, and their ministry here in the hospital of sharing light and love. Pt shared many stories as Designer, industrial/product reflective listening.  Chaplain also expressed gratitude for the the Pt being part of the legacy of chaplains in [facility]  Chaplain services remain available by Spiritual Consult or for emergent cases, paging [phone]  Chaplain Raelene Bott, MDiv [email or web address] [phone]
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
## 2023-01-08 MED ORDER — ACETAMINOPHEN 500 MG PO TABS: 1000.0000 mg | ORAL_TABLET | Freq: Four times a day (QID) | ORAL | Status: DC | PRN

## 2023-01-08 MED ORDER — MAGNESIUM CITRATE PO SOLN
1.0000 | Freq: Once | ORAL | Status: AC
  Administered 2023-01-08: 1 via ORAL
  Filled 2023-01-08: qty 296

## 2023-01-08 MED ORDER — MAGNESIUM CITRATE PO SOLN
0.5000 | Freq: Once | ORAL | Status: DC
  Filled 2023-01-08: qty 296
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
## 2023-01-08 NOTE — Progress Notes (Signed)
Patient ID: Edward Waller, male   DOB: May 06, 1932, 89 y.o.   MRN: [id number] BP (!) 162/94 (BP Location: Left Arm)   Pulse 66   Temp 97.6 F (36.4 C) (Oral)   Resp 20   Ht 6' (1.829 m)   Wt 72.6 kg   SpO2 98%   BMI 21.70 kg/m  Alert and oriented x4 Wounds are clean, and dry Will direct questions at this time to the office, [phone]
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
## 2023-01-09 LAB — BASIC METABOLIC PANEL
Anion gap: 10 (ref 5–15)
BUN: 22 mg/dL (ref 8–23)
CO2: 27 mmol/L (ref 22–32)
Calcium: 8.9 mg/dL (ref 8.9–10.3)
Chloride: 99 mmol/L (ref 98–111)
Creatinine, Ser: 0.9 mg/dL (ref 0.61–1.24)
GFR, Estimated: >60 mL/min (ref >60)
Glucose, Bld: 170 mg/dL — ABNORMAL HIGH (ref 70–99)
Potassium: 4.8 mmol/L (ref 3.5–5.1)
Sodium: 136 mmol/L (ref 135–145)

## 2023-01-09 LAB — MAGNESIUM: Magnesium: 2.3 mg/dL (ref 1.7–2.4)

## 2023-01-09 MED ORDER — SORBITOL 70 % SOLN
960.0000 mL | TOPICAL_OIL | Freq: Once | ORAL | Status: AC
  Administered 2023-01-09: 960 mL via RECTAL
  Filled 2023-01-09: qty 240

## 2023-01-09 MED ORDER — PROCHLORPERAZINE EDISYLATE 10 MG/2ML IJ SOLN: 10.0000 mg | Freq: Four times a day (QID) | INTRAMUSCULAR | Status: DC | PRN

## 2023-01-09 MED ORDER — ENOXAPARIN SODIUM 30 MG/0.3ML IJ SOSY
30.0000 mg | PREFILLED_SYRINGE | Freq: Two times a day (BID) | INTRAMUSCULAR | Status: DC
  Administered 2023-01-09 – 2023-01-10 (×2): 30 mg via SUBCUTANEOUS
  Filled 2023-01-09 (×2): qty 0.3
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
## 2023-01-09 NOTE — TOC Transition Note (Signed)
Transition of Care [facility]) - CM/SW Discharge Note   Patient Details  Name: Edward Waller MRN: [id number] Date of Birth: 10/20/1932  [facility] CM/SW Contact:  Glennon Mac, RN Phone Number: 01/09/2023, 2:19 PM   Clinical Narrative:    Patient medically stable for discharge today and has been approved by the [facility] for admission to inpatient rehab.  Plan discharge to CIR when bed ready.   Final next level of care: IP Rehab Facility Barriers to Discharge: Barriers Resolved                         Discharge Plan and Services Additional resources added to the After Visit Summary for     Discharge Planning Services: CM Consult Post Acute Care Choice: IP Rehab                               Social Determinants of Health (SDOH) Interventions SDOH Screenings   Food Insecurity: No Food Insecurity (01/04/2023)  Housing: Low Risk  (01/04/2023)  Transportation Needs: No Transportation Needs (01/04/2023)  Utilities: Not At Risk (01/04/2023)  Tobacco Use: Low Risk  (01/03/2023)     Readmission Risk Interventions     No data to display         Quintella Baton, RN, BSN  Trauma/Neuro ICU Case Manager [phone]
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
## 2023-01-10 ENCOUNTER — Other Ambulatory Visit: Payer: Self-pay

## 2023-01-10 ENCOUNTER — Inpatient Hospital Stay (HOSPITAL_COMMUNITY)
Admission: RE | Admit: 2023-01-10 | Discharge: 2023-02-13 | DRG: 052 | Disposition: A | Discharge status: Home Health | Payer: No Typology Code available for payment source | Source: Intra-hospital | Attending: Physical Medicine and Rehabilitation | Admitting: Physical Medicine and Rehabilitation

## 2023-01-10 ENCOUNTER — Encounter (HOSPITAL_COMMUNITY): Payer: Self-pay

## 2023-01-10 ENCOUNTER — Encounter (HOSPITAL_COMMUNITY): Payer: Self-pay | Admitting: Physical Medicine and Rehabilitation

## 2023-01-10 DIAGNOSIS — F32A Depression, unspecified: Secondary | ICD-10-CM | POA: Diagnosis not present

## 2023-01-10 DIAGNOSIS — Z96641 Presence of right artificial hip joint: Secondary | ICD-10-CM | POA: Diagnosis present

## 2023-01-10 DIAGNOSIS — M7989 Other specified soft tissue disorders: Secondary | ICD-10-CM | POA: Diagnosis not present

## 2023-01-10 DIAGNOSIS — S24103A Unspecified injury at T7-T10 level of thoracic spinal cord, initial encounter: Secondary | ICD-10-CM | POA: Diagnosis not present

## 2023-01-10 DIAGNOSIS — Z79899 Other long term (current) drug therapy: Secondary | ICD-10-CM

## 2023-01-10 DIAGNOSIS — S24103D Unspecified injury at T7-T10 level of thoracic spinal cord, subsequent encounter: Secondary | ICD-10-CM

## 2023-01-10 DIAGNOSIS — E872 Acidosis, unspecified: Secondary | ICD-10-CM | POA: Diagnosis not present

## 2023-01-10 DIAGNOSIS — H5789 Other specified disorders of eye and adnexa: Secondary | ICD-10-CM | POA: Diagnosis not present

## 2023-01-10 DIAGNOSIS — T83511S Infection and inflammatory reaction due to indwelling urethral catheter, sequela: Secondary | ICD-10-CM | POA: Diagnosis not present

## 2023-01-10 DIAGNOSIS — S24109D Unspecified injury at unspecified level of thoracic spinal cord, subsequent encounter: Secondary | ICD-10-CM

## 2023-01-10 DIAGNOSIS — E440 Moderate protein-calorie malnutrition: Secondary | ICD-10-CM | POA: Diagnosis not present

## 2023-01-10 DIAGNOSIS — G8221 Paraplegia, complete: Principal | ICD-10-CM | POA: Diagnosis present

## 2023-01-10 DIAGNOSIS — J9811 Atelectasis: Secondary | ICD-10-CM | POA: Diagnosis not present

## 2023-01-10 DIAGNOSIS — E039 Hypothyroidism, unspecified: Secondary | ICD-10-CM | POA: Diagnosis present

## 2023-01-10 DIAGNOSIS — M1612 Unilateral primary osteoarthritis, left hip: Secondary | ICD-10-CM | POA: Diagnosis present

## 2023-01-10 DIAGNOSIS — K5900 Constipation, unspecified: Secondary | ICD-10-CM | POA: Diagnosis present

## 2023-01-10 DIAGNOSIS — G253 Myoclonus: Secondary | ICD-10-CM | POA: Diagnosis present

## 2023-01-10 DIAGNOSIS — S22069D Unspecified fracture of T7-T8 vertebra, subsequent encounter for fracture with routine healing: Secondary | ICD-10-CM | POA: Diagnosis not present

## 2023-01-10 DIAGNOSIS — R319 Hematuria, unspecified: Secondary | ICD-10-CM | POA: Diagnosis not present

## 2023-01-10 DIAGNOSIS — R61 Generalized hyperhidrosis: Secondary | ICD-10-CM | POA: Diagnosis not present

## 2023-01-10 DIAGNOSIS — E871 Hypo-osmolality and hyponatremia: Secondary | ICD-10-CM | POA: Diagnosis not present

## 2023-01-10 DIAGNOSIS — N390 Urinary tract infection, site not specified: Secondary | ICD-10-CM | POA: Diagnosis not present

## 2023-01-10 DIAGNOSIS — I82451 Acute embolism and thrombosis of right peroneal vein: Secondary | ICD-10-CM | POA: Diagnosis not present

## 2023-01-10 DIAGNOSIS — Z6832 Body mass index (BMI) 32.0-32.9, adult: Secondary | ICD-10-CM

## 2023-01-10 DIAGNOSIS — N3289 Other specified disorders of bladder: Secondary | ICD-10-CM | POA: Diagnosis present

## 2023-01-10 DIAGNOSIS — Y92410 Unspecified street and highway as the place of occurrence of the external cause: Secondary | ICD-10-CM

## 2023-01-10 DIAGNOSIS — N319 Neuromuscular dysfunction of bladder, unspecified: Secondary | ICD-10-CM | POA: Diagnosis present

## 2023-01-10 DIAGNOSIS — M1990 Unspecified osteoarthritis, unspecified site: Secondary | ICD-10-CM | POA: Diagnosis present

## 2023-01-10 DIAGNOSIS — G2581 Restless legs syndrome: Secondary | ICD-10-CM | POA: Diagnosis present

## 2023-01-10 DIAGNOSIS — M21372 Foot drop, left foot: Secondary | ICD-10-CM | POA: Diagnosis present

## 2023-01-10 DIAGNOSIS — G4700 Insomnia, unspecified: Secondary | ICD-10-CM | POA: Diagnosis not present

## 2023-01-10 DIAGNOSIS — W2203XA Walked into furniture, initial encounter: Secondary | ICD-10-CM | POA: Diagnosis present

## 2023-01-10 DIAGNOSIS — R338 Other retention of urine: Secondary | ICD-10-CM | POA: Diagnosis not present

## 2023-01-10 DIAGNOSIS — R1310 Dysphagia, unspecified: Secondary | ICD-10-CM | POA: Diagnosis not present

## 2023-01-10 DIAGNOSIS — R0789 Other chest pain: Secondary | ICD-10-CM | POA: Diagnosis not present

## 2023-01-10 DIAGNOSIS — M545 Low back pain, unspecified: Secondary | ICD-10-CM | POA: Diagnosis present

## 2023-01-10 DIAGNOSIS — M542 Cervicalgia: Secondary | ICD-10-CM | POA: Diagnosis not present

## 2023-01-10 DIAGNOSIS — Z981 Arthrodesis status: Secondary | ICD-10-CM

## 2023-01-10 DIAGNOSIS — R066 Hiccough: Secondary | ICD-10-CM | POA: Diagnosis not present

## 2023-01-10 DIAGNOSIS — F419 Anxiety disorder, unspecified: Secondary | ICD-10-CM | POA: Diagnosis present

## 2023-01-10 DIAGNOSIS — R7401 Elevation of levels of liver transaminase levels: Secondary | ICD-10-CM | POA: Diagnosis present

## 2023-01-10 DIAGNOSIS — K592 Neurogenic bowel, not elsewhere classified: Secondary | ICD-10-CM | POA: Diagnosis present

## 2023-01-10 DIAGNOSIS — R252 Cramp and spasm: Secondary | ICD-10-CM | POA: Diagnosis not present

## 2023-01-10 DIAGNOSIS — Z79890 Hormone replacement therapy (postmenopausal): Secondary | ICD-10-CM

## 2023-01-10 DIAGNOSIS — Z7901 Long term (current) use of anticoagulants: Secondary | ICD-10-CM

## 2023-01-10 DIAGNOSIS — F515 Nightmare disorder: Secondary | ICD-10-CM | POA: Diagnosis not present

## 2023-01-10 DIAGNOSIS — G479 Sleep disorder, unspecified: Secondary | ICD-10-CM | POA: Diagnosis not present

## 2023-01-10 DIAGNOSIS — S99921A Unspecified injury of right foot, initial encounter: Secondary | ICD-10-CM | POA: Diagnosis not present

## 2023-01-10 DIAGNOSIS — G8222 Paraplegia, incomplete: Secondary | ICD-10-CM | POA: Diagnosis present

## 2023-01-10 MED ORDER — ACETAMINOPHEN 500 MG PO TABS
1000.0000 mg | ORAL_TABLET | Freq: Four times a day (QID) | ORAL | Status: DC | PRN
  Administered 2023-01-15 – 2023-01-16 (×3): 1000 mg via ORAL
  Filled 2023-01-10 (×5): qty 2

## 2023-01-10 MED ORDER — MELATONIN 5 MG PO TABS
10.0000 mg | ORAL_TABLET | Freq: Every day | ORAL | Status: DC
  Administered 2023-01-10 – 2023-02-08 (×30): 10 mg via ORAL
  Filled 2023-01-10 (×30): qty 2

## 2023-01-10 MED ORDER — ENOXAPARIN SODIUM 30 MG/0.3ML IJ SOSY
30.0000 mg | PREFILLED_SYRINGE | Freq: Two times a day (BID) | INTRAMUSCULAR | Status: DC
  Administered 2023-01-10 – 2023-01-11 (×3): 30 mg via SUBCUTANEOUS
  Filled 2023-01-10 (×3): qty 0.3

## 2023-01-10 MED ORDER — BISACODYL 10 MG RE SUPP: 10.0000 mg | Freq: Every day | RECTAL | Status: DC

## 2023-01-10 MED ORDER — OXYCODONE HCL 5 MG PO TABS
5.0000 mg | ORAL_TABLET | ORAL | Status: DC | PRN
  Administered 2023-01-30: 5 mg via ORAL
  Filled 2023-01-10 (×5): qty 1

## 2023-01-10 MED ORDER — ONDANSETRON HCL 4 MG/2ML IJ SOLN: 4.0000 mg | Freq: Four times a day (QID) | INTRAMUSCULAR | Status: DC | PRN

## 2023-01-10 MED ORDER — LEVOTHYROXINE SODIUM 50 MCG PO TABS
50.0000 ug | ORAL_TABLET | Freq: Every day | ORAL | Status: DC
  Administered 2023-01-11 – 2023-02-13 (×34): 50 ug via ORAL
  Filled 2023-01-10 (×34): qty 1

## 2023-01-10 MED ORDER — POLYETHYLENE GLYCOL 3350 17 G PO PACK
32.0000 g | PACK | Freq: Once | ORAL | Status: AC
  Administered 2023-01-10: 32 g via ORAL
  Filled 2023-01-10: qty 2

## 2023-01-10 MED ORDER — ONDANSETRON 4 MG PO TBDP
4.0000 mg | ORAL_TABLET | Freq: Four times a day (QID) | ORAL | Status: DC | PRN
  Administered 2023-01-15: 4 mg via ORAL
  Filled 2023-01-10: qty 1

## 2023-01-10 MED ORDER — POLYETHYLENE GLYCOL 3350 17 G PO PACK
17.0000 g | PACK | Freq: Every day | ORAL | Status: DC
  Administered 2023-01-11 – 2023-01-27 (×17): 17 g via ORAL
  Filled 2023-01-10 (×17): qty 1

## 2023-01-10 NOTE — Progress Notes (Signed)
Orthopedic Tech Progress Note Patient Details:  Edward Waller [birth year] [id number]  Ortho Devices Type of Ortho Device: Prafo boot/shoe Ortho Device/Splint Location: Bi LE Ortho Device/Splint Interventions: Application   Post Interventions Patient Tolerated: Well Instructions Provided: Adjustment of device  [name] 01/10/2023, 6:17 PM

## 2023-01-10 NOTE — Progress Notes (Signed)
Inpatient Rehabilitation Admission Medication Review by a Pharmacist  A complete drug regimen review was completed for this patient to identify any potential clinically significant medication issues.  High Risk Drug Classes Is patient taking? Indication by Medication  Antipsychotic No   Anticoagulant Yes Enoxaparin - VTE proph  Antibiotic No   Opioid Yes Oxycodone - pain  Antiplatelet No   Hypoglycemics/insulin No   Vasoactive Medication No   Chemotherapy No   Other Yes Levothyroxine - hypothyroid Melatonin - sleep Ondansetron - nausea     Type of Medication Issue Identified Description of Issue Recommendation(s)  Drug Interaction(s) (clinically significant)     Duplicate Therapy     Allergy     No Medication Administration End Date     Incorrect Dose     Additional Drug Therapy Needed     Significant med changes from prior encounter (inform family/care partners about these prior to discharge). PTA meds not resumed: Ropinirole  Consider resuming PTA medications during CIR admit or upon discharge as appropriate    Other       Clinically significant medication issues were identified that warrant physician communication and completion of prescribed/recommended actions by midnight of the next day:  No   Pharmacist comments:   Time spent performing this drug regimen review (minutes):  15   Loura Back, PharmD, BCPS 7:48 PM
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
## 2023-01-11 ENCOUNTER — Inpatient Hospital Stay (HOSPITAL_COMMUNITY): Payer: No Typology Code available for payment source

## 2023-01-11 DIAGNOSIS — M7989 Other specified soft tissue disorders: Secondary | ICD-10-CM

## 2023-01-11 DIAGNOSIS — G8221 Paraplegia, complete: Secondary | ICD-10-CM | POA: Diagnosis not present

## 2023-01-11 MED ORDER — BISACODYL 10 MG RE SUPP
10.0000 mg | Freq: Every day | RECTAL | Status: DC
  Administered 2023-01-11 – 2023-02-12 (×26): 10 mg via RECTAL
  Filled 2023-01-11 (×27): qty 1

## 2023-01-11 MED ORDER — APIXABAN 5 MG PO TABS
5.0000 mg | ORAL_TABLET | Freq: Two times a day (BID) | ORAL | Status: DC
  Administered 2023-01-18 – 2023-02-13 (×52): 5 mg via ORAL
  Filled 2023-01-11 (×53): qty 1

## 2023-01-11 MED ORDER — TRAZODONE HCL 50 MG PO TABS
50.0000 mg | ORAL_TABLET | Freq: Every day | ORAL | Status: DC
  Administered 2023-01-11 – 2023-01-14 (×4): 50 mg via ORAL
  Filled 2023-01-11 (×4): qty 1

## 2023-01-11 MED ORDER — APIXABAN 5 MG PO TABS
10.0000 mg | ORAL_TABLET | Freq: Two times a day (BID) | ORAL | Status: AC
  Administered 2023-01-11 – 2023-01-18 (×14): 10 mg via ORAL
  Filled 2023-01-11 (×14): qty 2
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
## 2023-01-11 NOTE — Progress Notes (Signed)
Foley catheter discontinued.

## 2023-01-11 NOTE — Discharge Instructions (Addendum)
Inpatient Rehab Discharge Instructions  JAMIR Edward Waller Discharge date and time:    Activities/Precautions/ Functional Status: Activity: Back brace when out of bed Diet: Regular Wound Care: Routine skin checks   Functional status:  ___ No restrictions     ___ Walk up steps independently _X__ 24/7 supervision/assistance   ___ Walk up steps with assistance ___ Intermittent supervision/assistance  ___ Bathe/dress independently ___ Walk with walker     _X__ Bathe/dress with assistance ___ Walk Independently    ___ Shower independently ___ Walk with assistance    ___ Shower with assistance _X__ No alcohol     ___ Return to work/school ________  Special Instructions: Boost every 20 minutes when in chair to prevent ulcers/breakdown   COMMUNITY REFERRALS UPON DISCHARGE:    Home Health:   PT      OT      RN    SNA    SW                   Agency:[facility]     Phone:[phone] **Please expect follow-up within 2-3 business days for discharge to schedule your home visit. If you have not received follow-up, be sure to contact the site directly.*     Medical Equipment/Items Ordered:hospital bed, hoyer lift, drop arm bedside commode, tub transfer bench, over the bed table, incontinence supplies (briefs and chux), and catheters                                                 Agency/Supplier: All items [facility] arranged by [facility]. If you need to check the status, contact: [employer] [phone]. Ramp- [employer] [phone] ; [facility]iene [facility]- [facility] [phone], press option '0' and request to be transferred to pharmacy.       My questions have been answered and I understand these instructions. I will adhere to these goals and the provided educational materials after my discharge from the hospital.  Patient/Caregiver Signature _______________________________ Date __________  Clinician Signature _______________________________________ Date  __________  Please bring this form and your medication list with you to all your follow-up doctor's appointments.   Information on my medicine - ELIQUIS (apixaban)  This medication education was reviewed with me or my healthcare representative as part of my discharge preparation. Why was Eliquis prescribed for you? Eliquis was prescribed to treat blood clots that may have been found in the veins of your legs (deep vein thrombosis) or in your lungs (pulmonary embolism) and to reduce the risk of them occurring again.  What do You need to know about Eliquis ? The dose is ONE 5 mg tablet taken TWICE daily.  Eliquis may be taken with or without food.   Try to take the dose about the same time in the morning and in the evening. If you have difficulty swallowing the tablet whole please discuss with your pharmacist how to take the medication safely.  Take Eliquis exactly as prescribed and DO NOT stop taking Eliquis without talking to the doctor who prescribed the medication.  Stopping may increase your risk of developing a new blood clot.  Refill your prescription before you run out.  After discharge, you should have regular check-up appointments with your healthcare provider that is prescribing your Eliquis.    What do you do if you miss a dose? If a dose of ELIQUIS  is not taken at the scheduled time, take it as soon as possible on the same day and twice-daily administration should be resumed. The dose should not be doubled to make up for a missed dose.  Important Safety Information A possible side effect of Eliquis is bleeding. You should call your healthcare provider right away if you experience any of the following: Bleeding from an injury or your nose that does not stop. Unusual colored urine (red or dark brown) or unusual colored stools (red or black). Unusual bruising for unknown reasons. A serious fall or if you hit your head (even if there is no bleeding).  Some medicines may  interact with Eliquis and might increase your risk of bleeding or clotting while on Eliquis. To help avoid this, consult your healthcare provider or pharmacist prior to using any new prescription or non-prescription medications, including herbals, vitamins, non-steroidal anti-inflammatory drugs (NSAIDs) and supplements.  This website has more information on Eliquis (apixaban): http://[email or web address].com/eliquis/home

## 2023-01-11 NOTE — Progress Notes (Signed)
VASCULAR LAB    Bilateral lower extremity venous duplex has been performed.  See CV proc for preliminary results.  Gave verbal report to Ramsey, Charity fundraiser.  Also messaged Dr. Berline Chough and Jolyn Lent, RN via secure chat  Sherren Kerns, RVT 01/11/2023, 5:44 PM

## 2023-01-11 NOTE — Progress Notes (Signed)
PROGRESS NOTE   Subjective/Complaints:  Pt reports didn't sleep well- mainly due to discomfort/positioning- finally got in a good position this AM.   Did have BM overnight- still bothersome/distension.   Still has foley- but plan is to d/c today and start cathing.   ROS:  Pt denies SOB, abd pain, CP, N/V/C/D, and vision changes   Objective:   DG Abd 1 View  Result Date: 01/11/2023 CLINICAL DATA:  [id number] Constipation by delayed colonic transit [id number] EXAM: ABDOMEN - 1 VIEW COMPARISON:  January 02, 2023 FINDINGS: Air and stool-filled nondilated loops of bowel. Moderate colonic stool burden diffusely throughout the colon. Visualized lung bases are unremarkable. Degenerative changes of the lumbar spine. Dextroscoliosis of the lumbar spine with chronic wedging of several vertebral bodies. Status post RIGHT hip arthroplasty. Degenerative changes of the LEFT hip. IMPRESSION: Nonobstructive bowel gas pattern. Electronically Signed   By: Meda Klinefelter M.D.   On: 01/11/2023 10:10   No results for input(s): "WBC", "HGB", "HCT", "PLT" in the last 72 hours. Recent Labs    01/09/23 1926  NA 136  K 4.8  CL 99  CO2 27  GLUCOSE 170*  BUN 22  CREATININE 0.90  CALCIUM 8.9    Intake/Output Summary (Last 24 hours) at 01/11/2023 1036 Last data filed at 01/11/2023 0836 Gross per 24 hour  Intake 360 ml  Output 2000 ml  Net -1640 ml        Physical Exam: Vital Signs Blood pressure 120/76, pulse 68, temperature 98.3 F (36.8 C), temperature source Oral, resp. rate 18, weight 72.9 kg, SpO2 93%.   General: awake, alert, appropriate, sitting up slightly in bed; positioned with a lot of pillows/ wedges; NAD HENT: conjugate gaze; oropharynx moist CV: regular rate and rhythm; no JVD Pulmonary: CTA B/L; no W/R/R- good air movement GI: soft, NT, somewhat distended- softer, but still moderate distension-  Psychiatric:  appropriate Neurological: Ox3 Neurological:     Mental Status: He is alert.     Comments: Alert and oriented x 3. Normal insight and awareness. Intact Memory. Normal language and speech. Cranial nerve exam unremarkable. MMT: 5/5 UE, 0/5 bilateral LE's. No sensation to LT or pain from approximately T8/9. LE DTR's 1-2+ bilaterally. Mild extensor tone RLE with 1-2 beats of clonus appreciated. .   Assessment/Plan: 1. Functional deficits which require 3+ hours per day of interdisciplinary therapy in a comprehensive inpatient rehab setting. Physiatrist is providing close team supervision and 24 hour management of active medical problems listed below. Physiatrist and rehab team continue to assess barriers to discharge/monitor patient progress toward functional and medical goals  Care Tool:  Bathing              Bathing assist       Upper Body Dressing/Undressing Upper body dressing        Upper body assist      Lower Body Dressing/Undressing Lower body dressing            Lower body assist       Toileting Toileting    Toileting assist       Transfers Chair/bed transfer  Transfers assist  Locomotion Ambulation   Ambulation assist              Walk 10 feet activity   Assist           Walk 50 feet activity   Assist           Walk 150 feet activity   Assist           Walk 10 feet on uneven surface  activity   Assist           Wheelchair     Assist               Wheelchair 50 feet with 2 turns activity    Assist            Wheelchair 150 feet activity     Assist          Blood pressure 120/76, pulse 68, temperature 98.3 F (36.8 C), temperature source Oral, resp. rate 18, weight 72.9 kg, SpO2 93%.   Medical Problem List and Plan: 1. 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  Status post ORIF T7 and T9 01/03/2023.  Back brace when out of bed             -patient  may shower             -ELOS/Goals: ~28 days at min to mod assist w/c level, reduce burden of care              -ordered PRAFO's to replace prevalon boots  Admit to CIR- first day of evaluations- has PRAFOs- worse o/n-  D/c foley and start bowel program/bladder program  -[facility] 2.  Antithrombotics: Lovenox added for DVT prophylaxis 01/09/2023. -  Check vascular study             -antiplatelet therapy: N/A 3. Pain Management: Oxycodone as needed  9/21- very little pain- con't regimen 4. Mood/Behavior/Sleep: Provide emotional support             -pt in reasonable spirits at present.              -neuropsych eval, consider antidepressant             -antipsychotic agents: N/A 5. Neuropsych/cognition: This patient is capable of making decisions on his own behalf. 6. Skin/Wound Care: Routine skin checks, turning, air matress             -adequate nutrition 7. Fluids/Electrolytes/Nutrition: Routine in and outs with follow-up chemistries             -pt appears to have good appetite 8.  Neurogenic bowel:             -had bm 9/19 -initiate bowel program tonight with Dulcolax suppository in evening and MiraLAX qam. Adjust as needed 9/21- KUB looks well- abdomen is still distended; will con't bowel program and see if can get pt's abd less distended 9. Neurogenic bladder:             -continue foley tonight             -will need I/O cath regimen  9/21- will d/c foley and start in/out cathing q6 hours, regardless, because is complete injury.  10. .  Hypothyroidism.  Synthroid  11. At level SCI pain- tightness  9/21- pt wants ot wait on Nerve pain meds 12. B/L foot drop  9/21- will cont PRAFOs- that received yesterday    I spent a total of 52  minutes on total care today- >50% coordination of care- due to  D/w staff about bowel and bladder program- d/w about discomfort and constipation- will d/c foley today and start cathing- needs scheduled since is complete injury.       LOS: 1 days A FACE TO FACE EVALUATION WAS PERFORMED  Dyani Babel 01/11/2023, 10:36 AM

## 2023-01-11 NOTE — Progress Notes (Signed)
Physical Therapy Session Note  Patient Details  Name: Edward Waller MRN: [id number] Date of Birth: 07-19-1932  Today's Date: 01/11/2023 PT Individual Time: 1345-1450 PT Individual Time Calculation (min): 65 min   Short Term Goals: Week 1:  PT Short Term Goal 1 (Week 1): Pt will maintain sitting balance with CGA consistently PT Short Term Goal 2 (Week 1): Pt will be able to recall timing and initiate pressure relief independently, or request assist as needed PT Short Term Goal 3 (Week 1): Pt will initiate w/c propulsion/navigation  Skilled Therapeutic Interventions/Progress Updates:  pt received in bed and agreeable to therapy. No complaint of pain. Minimal report of lightheaded ness after sitting EOB for several minutes. Resolved almost immediately in TIS with legs elevated.   Session focused on sitting balance and transfer training. Supine>sit with tot A for BLE and mod A for trunk management. Pt performed slideboard transfer x 2 with max A overall for cueing and elevation for hip clearance, +2 for safety with some scoots being true mod A. Pt often requires step by step cueing for hand placement and trunk placement, including head hips relationship.  Seated EOB, pt was able to maintain sitting balance with UE support with close supervision and +2 standby. Able to perform intermittent UUE and no UE support with CGA. Pt performs lateral leans to elbow with min A and is able recover. Discussed using as rest and for placing board, clothing management, etc.  Pt remained in TIS at end of session and was left with all needs in reach.   Therapy Documentation Precautions:  Precautions Precautions: Back Precaution Booklet Issued: No Precaution Comments: Abdominal binder and thigh High ted hose for BP management for OOB. Required Braces or Orthoses: Spinal Brace Spinal Brace: Thoracolumbosacral orthotic, Applied in sitting position Restrictions Weight Bearing Restrictions: No General:        Therapy/Group: Individual Therapy  [name] 01/11/2023, 3:37 PM

## 2023-01-11 NOTE — Plan of Care (Signed)
  Problem: Consults Goal: RH SPINAL CORD INJURY PATIENT EDUCATION Description:  See Patient Education module for education specifics.  Outcome: Progressing   Problem: SCI BOWEL ELIMINATION Goal: RH STG SCI MANAGE BOWEL PROGRAM W/ASSIST OR AS APPROPRIATE Description: STG SCI Manage bowel program w/mod assist or as appropriate. Outcome: Progressing   Problem: SCI BLADDER ELIMINATION Goal: RH STG MANAGE BLADDER WITH MEDICATION WITH ASSISTANCE Description: STG Manage Bladder With Medication With min Assistance. Outcome: Progressing Goal: RH STG SCI MANAGE BLADDER PROGRAM W/ASSISTANCE Description: Patient/caregiver will be able to manage bladder program or foley care at home min assist  Outcome: Progressing   Problem: RH SKIN INTEGRITY Goal: RH STG SKIN FREE OF INFECTION/BREAKDOWN Description: Skin will remain intact and free of breakdown with min assist  Outcome: Progressing Goal: RH STG MAINTAIN SKIN INTEGRITY WITH ASSISTANCE Description: STG Maintain Skin Integrity With min Assistance. Outcome: Progressing   Problem: RH SAFETY Goal: RH STG ADHERE TO SAFETY PRECAUTIONS W/ASSISTANCE/DEVICE Description: STG Adhere to Safety Precautions With min Assistance/Device. Outcome: Progressing Goal: RH STG DECREASED RISK OF FALL WITH ASSISTANCE Description: STG Decreased Risk of Fall With min Assistance. Outcome: Progressing   Problem: RH PAIN MANAGEMENT Goal: RH STG PAIN MANAGED AT OR BELOW PT'S PAIN GOAL Description: Pain will be managed less than 4 with PRN medications min assist  Outcome: Progressing   Problem: RH KNOWLEDGE DEFICIT SCI Goal: RH STG INCREASE KNOWLEDGE OF SELF CARE AFTER SCI Description: Patient/caregiver will be able to manage medications and bowel/bladder program independently from nursing education, nursing handouts, and other resources independently  Outcome: Progressing   Problem: Education: Goal: Knowledge of the prescribed therapeutic regimen will  improve Outcome: Progressing Goal: Understanding of discharge needs will improve Outcome: Progressing Goal: Individualized Educational Video(s) Outcome: Progressing   Problem: Activity: Goal: Ability to avoid complications of mobility impairment will improve Outcome: Progressing Goal: Ability to tolerate increased activity will improve Outcome: Progressing   Problem: Clinical Measurements: Goal: Postoperative complications will be avoided or minimized Outcome: Progressing   Problem: Pain Management: Goal: Pain level will decrease with appropriate interventions Outcome: Progressing   Problem: Skin Integrity: Goal: Will show signs of wound healing Outcome: Progressing

## 2023-01-11 NOTE — Progress Notes (Signed)
Patient did not have  urine output for  6 hours after Primary nurse pulled his foley cath. Bladder scan volume showed 575 ml.    Utilizing sterile technique, In and out cath done with buddy [facility]. Immediate return of 700 ml clear/yellow urine output. Patient tolerated procedure well.

## 2023-01-11 NOTE — Progress Notes (Signed)
IP Rehab Bowel Program Documentation   Bowel Program Start time 1820   Dig Stim Indicated? Yes  Dig Stim Prior to Suppository or mini Enema X 2   Output from dig stim: large  Ordered intervention: Suppository Yes , mini enema No ,   Repeat dig stim after Suppository or Mini enema  X {Numbers; 1-5:17750},  Output? {Desc; minimal/small/moderate/large/very large:110034}   Bowel Program Complete? {YES/NO:21197}, handoff given ***  Patient Tolerated? {YES/NO:21197}

## 2023-01-11 NOTE — Plan of Care (Signed)
  Problem: RH Balance Goal: LTG: Patient will maintain dynamic sitting balance (OT) Description: LTG:  Patient will maintain dynamic sitting balance with assistance during activities of daily living (OT) Flowsheets (Taken 01/11/2023 1633) LTG: Pt will maintain dynamic sitting balance during ADLs with: Supervision/Verbal cueing   Problem: RH Bathing Goal: LTG Patient will bathe all body parts with assist levels (OT) Description: LTG: Patient will bathe all body parts with assist levels (OT) Flowsheets (Taken 01/11/2023 1633) LTG: Pt will perform bathing with assistance level/cueing: Minimal Assistance - Patient > 75%   Problem: RH Dressing Goal: LTG Patient will perform upper body dressing (OT) Description: LTG Patient will perform upper body dressing with assist, with/without cues (OT). Flowsheets (Taken 01/11/2023 1633) LTG: Pt will perform upper body dressing with assistance level of: Supervision/Verbal cueing Goal: LTG Patient will perform lower body dressing w/assist (OT) Description: LTG: Patient will perform lower body dressing with assist, with/without cues in positioning using equipment (OT) Flowsheets (Taken 01/11/2023 1633) LTG: Pt will perform lower body dressing with assistance level of: Minimal Assistance - Patient > 75%   Problem: RH Toileting Goal: LTG Patient will perform toileting task (3/3 steps) with assistance level (OT) Description: LTG: Patient will perform toileting task (3/3 steps) with assistance level (OT)  Flowsheets (Taken 01/11/2023 1633) LTG: Pt will perform toileting task (3/3 steps) with assistance level: Moderate Assistance - Patient 50 - 74%   Problem: RH Toilet Transfers Goal: LTG Patient will perform toilet transfers w/assist (OT) Description: LTG: Patient will perform toilet transfers with assist, with/without cues using equipment (OT) Flowsheets (Taken 01/11/2023 1633) LTG: Pt will perform toilet transfers with assistance level of: Minimal Assistance -  Patient > 75%   Problem: RH Tub/Shower Transfers Goal: LTG Patient will perform tub/shower transfers w/assist (OT) Description: LTG: Patient will perform tub/shower transfers with assist, with/without cues using equipment (OT) Flowsheets (Taken 01/11/2023 1633) LTG: Pt will perform tub/shower stall transfers with assistance level of: Minimal Assistance - Patient > 75%   Problem: RH Memory Goal: LTG Patient will demonstrate ability for day to day recall/carry over during activities of daily living with assistance level (OT) Description: LTG:  Patient will demonstrate ability for day to day recall/carry over during activities of daily living with assistance level (OT). Flowsheets (Taken 01/11/2023 1633) LTG:  Patient will demonstrate ability for day to day recall/carry over during activities of daily living with assistance level (OT): Supervision

## 2023-01-12 DIAGNOSIS — G8221 Paraplegia, complete: Secondary | ICD-10-CM | POA: Diagnosis not present

## 2023-01-12 NOTE — Progress Notes (Cosign Needed)
Pt. A&O times 4, Respirations Regular and unlabored, Pt. Is on room air, chest expansion symmetrical, HS regular s1, s2, cap refill less than 3 seconds, Skin dry. Pt. Has a abrasion on his L hip. Pt . Is in good spirit and willing to cooperate with procedures.

## 2023-01-12 NOTE — Progress Notes (Signed)
Pt educated on increasing fluid intake after I&O cath. Family education provided on fluid intake and how much water patient should be drinking a day while awake. Pt and family expressed understanding. 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 Pt agrees on importance of staying on a schedule.

## 2023-01-12 NOTE — Progress Notes (Signed)
Physical Therapy Session Note  Patient Details  Name: Edward Waller MRN: [id number] Date of Birth: 12-Dec-1932  Today's Date: 01/12/2023 PT Individual Time: 1045-1203 PT Individual Time Calculation (min): 78 min   Short Term Goals: Week 1:  PT Short Term Goal 1 (Week 1): Pt will maintain sitting balance with CGA consistently PT Short Term Goal 2 (Week 1): Pt will be able to recall timing and initiate pressure relief independently, or request assist as needed PT Short Term Goal 3 (Week 1): Pt will initiate w/c propulsion/navigation  Skilled Therapeutic Interventions/Progress Updates:      Therapy Documentation Precautions:  Precautions Precautions: Back Precaution Booklet Issued: No Precaution Comments: Abdominal binder and thigh High ted hose for BP management for OOB. Required Braces or Orthoses: Spinal Brace Spinal Brace: Thoracolumbosacral orthotic, Applied in sitting position Restrictions Weight Bearing Restrictions: No   Pt agreeable to PT session with emphasis on postural control, seated balance and transfer training. Pt declines pain but reports discomfort from brace, PT adjusted for comfort.   Pt dependently transported by w/c throughout session. Initially pt required max A x 1 with slide board transfer from TIS to mat table. Pt progressed from min A to close supervision for static and dynamic sitting balance edge of mat. PT issued pt with bilateral leg loops and pt practiced repositioning legs with supervision.   Pt participated in blocked practice of lateral scooting along edge of mat in bilateral directions. Pt transitioned from max A to min A with bilateral knee block.   Pt able to weight shift on to elbow and use leg loop to lift leg as PT positioned slide board. Pt min A with lateral scooting to TIS (of note PT replaced ROHO cushion for hybrid gel to better transfers). Pt encouraged by progress in session and left seated at bedside with all needs in reach.      Therapy/Group: Individual Therapy  Truitt Leep Truitt Leep PT, DPT  01/12/2023, 7:55 AM

## 2023-01-12 NOTE — IPOC Note (Addendum)
Overall Plan of Care [facility]) Patient Details Name: Edward Waller MRN: [id number] DOB: 05/19/1932  Admitting Diagnosis: Complete paraplegia [facility])  Hospital Problems: Principal Problem:   Complete paraplegia (HCC)     Functional Problem List: Nursing Bladder, Bowel, Safety, Sensory, Endurance, Medication Management, Motor  PT Balance, Pain, Safety, Endurance, Motor, Sensory, Skin Integrity  OT Balance, Sensory, Skin Integrity, Edema, Endurance, Motor  SLP    TR         Basic ADL's: OT Toileting, Dressing, Bathing, Grooming     Advanced  ADL's: OT       Transfers: PT Bed Mobility, Bed to Chair, Banker, Technical brewer: PT Wheelchair Mobility     Additional Impairments: OT    SLP        TR      Anticipated Outcomes Item Anticipated Outcome  Self Feeding Set-up  Swallowing      Basic self-care  min A UB, mod LB  Toileting  mod A   Bathroom Transfers min A  Bowel/Bladder  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  Transfers  min a  Locomotion  mod I w/c navigation (manual vs power pending)  Communication     Cognition     Pain  less than 4  Safety/Judgment  no falls   Therapy Plan: PT Intensity: Minimum of 1-2 x/day ,45 to 90 minutes PT Frequency: 5 out of 7 days PT Duration Estimated Length of Stay: 4 weeks OT Intensity: Minimum of 1-2 x/day, 45 to 90 minutes OT Frequency: 5 out of 7 days OT Duration/Estimated Length of Stay: 4 weeks     Team Interventions: Nursing Interventions Patient/Family Education, Bladder Management, Bowel Management, Medication Management, Psychosocial Support, Disease Management/Prevention, Discharge Planning  PT interventions Discharge planning, DME/adaptive equipment instruction, Functional mobility training, Pain management, Psychosocial support, Splinting/orthotics, Therapeutic Activities, UE/LE Strength taining/ROM, Wheelchair propulsion/positioning, UE/LE Coordination activities, Therapeutic  Exercise, Patient/family education, Skin care/wound management, Neuromuscular re-education, Functional electrical stimulation, Disease management/prevention, Warden/ranger, Community reintegration  OT Interventions Warden/ranger, Discharge planning, Functional electrical stimulation, Pain management, Self Care/advanced ADL retraining, Therapeutic Activities, UE/LE Coordination activities, Disease mangement/prevention, Functional mobility training, Patient/family education, Skin care/wound managment, Therapeutic Exercise, Community reintegration, Fish farm manager, Neuromuscular re-education, Psychosocial support, Splinting/orthotics, UE/LE Strength taining/ROM, Wheelchair propulsion/positioning  SLP Interventions    TR Interventions    SW/CM Interventions Discharge Planning, Psychosocial Support, Patient/Family Education   Barriers to Discharge MD  Medical stability, Home enviroment access/loayout, Incontinence, Neurogenic bowel and bladder, Lack of/limited family support, and Weight bearing restrictions  Nursing Decreased caregiver support, Home environment access/layout, Neurogenic Bowel & Bladder 1 level with 2 ste no rail with ramp being built  PT Decreased caregiver support, Neurogenic Bowel & Bladder    OT      SLP      SW Decreased caregiver support, Lack of/limited family support, Community education officer for SNF coverage     Team Discharge Planning: Destination: PT-Home ,OT- Home , SLP-  Projected Follow-up: PT-Home health PT, OT-  Home health OT, SLP-  Projected Equipment Needs: PT-To be determined, OT- To be determined, SLP-  Equipment Details: PT- , OT-  Patient/family involved in discharge planning: PT- Patient,  OT-Patient , SLP-   MD ELOS: 4 weeks Medical Rehab Prognosis:  Good Assessment: The patient has been admitted for CIR therapies with the diagnosis of complete paraplegia. The team will be addressing functional mobility, strength,  stamina, balance, safety, adaptive techniques and equipment, self-care, bowel and bladder mgt, patient  and caregiver education, self cathing and bowel program; skin pressure relief; SCI education. Goals have been set at min-mod A. Anticipated discharge destination is home- with [facility], hopefully. .        See Team Conference Notes for weekly updates to the plan of care

## 2023-01-12 NOTE — Progress Notes (Signed)
Occupational Therapy Session Note  Patient Details  Name: Edward Waller MRN: [id number] Date of Birth: [birth year]  Today's Date: 01/12/2023 OT Individual Time: 6213-0865 OT Individual Time Calculation (min): 75 min    Short Term Goals: Week 1:  OT Short Term Goal 1 (Week 1): Pt will recall presure release schedule with min verbal cues OT Short Term Goal 2 (Week 1): Pt will tolerated sitting up in wc for 30 minutes or greater OT Short Term Goal 3 (Week 1): Pt will maintain sitting balance with min A consistently  Skilled Therapeutic Interventions/Progress Updates:    Pt resting in bed upon arrival with MD present. OT intervention with focus on bed mobility, dressing at bed level, sitting balance, SB transfers, ongoing education, and activity tolerance to increase independence with BADLs. Rolling in bed with mod A for BLE mgmt and pt using bed rails. Pt can pull himself onto his side after BLE positioned. Dependent for LB dressing at bed level. UB dressing at bed level with tot A. Supine>sit with max A. Sitting balance with BUE with max A. Dependent for donning TLSO when sitting EOB. Lateral lean for SB placement with max A. SB transfer to YIS w/c with max A. Adjustments made to Bil leg rest for improved placement of BLE. Pt remained in TIS w/c with all needs within reach. Seat belt secured.   Therapy Documentation Precautions:  Precautions Precautions: Back Precaution Booklet Issued: No Precaution Comments: Abdominal binder and thigh High ted hose for BP management for OOB. Required Braces or Orthoses: Spinal Brace Spinal Brace: Thoracolumbosacral orthotic, Applied in sitting position Restrictions Weight Bearing Restrictions: No   Pain: Pt denies pain this morning   Therapy/Group: Individual Therapy  [name] 01/12/2023, 11:53 AM

## 2023-01-12 NOTE — Progress Notes (Signed)
Pt. A&O times 4, Respirations Regular and unlabored, Pt. Is on room air, chest expansion symmetrical, HS regular s1, s2, cap refill less than 3 seconds, Skin dry. Pt. Has a abrasion on his L hip. Pt . Is in good spirit and willing to cooperate with procedures.   I agree with this student's documentation.

## 2023-01-12 NOTE — Progress Notes (Signed)
PROGRESS NOTE   Subjective/Complaints:   Pt reports slept better last night- good BM pt thinks with bowel program Per nursing was medium and large BM.   Cath volumes so far 500-900cc- if doesn't improve, will need to cath q4 hours.   Went over/educated pt on DVT and Eliquis.   ROS:   Pt denies SOB, abd pain, CP, N/V/C/D, and vision changes  Except for HPI Objective:   VAS [place] LOWER EXTREMITY VENOUS (DVT)  Result Date: 01/11/2023  Lower Venous DVT Study Patient Name:  Edward Waller  Date of Exam:   01/11/2023 Medical Rec #: [id number]        Accession #:    [id number] Date of Birth: [birth year]        Patient Gender: M Patient Age:   89 years Exam Location:  [facility] Procedure:      VAS [place] LOWER EXTREMITY VENOUS (DVT) Referring Phys: Mariam Dollar --------------------------------------------------------------------------------  Indications: Paraplegia.  Risk Factors: Immobility. Limitations: Habitus of the calf, inability to position legs. Comparison Study: Prior negative right LEV done 10/03/21 Performing Technologist: Sherren Kerns RVS  Examination Guidelines: A complete evaluation includes B-mode imaging, spectral Doppler, color Doppler, and power Doppler as needed of all accessible portions of each vessel. Bilateral testing is considered an integral part of a complete examination. Limited examinations for reoccurring indications may be performed as noted. The reflux portion of the exam is performed with the patient in reverse Trendelenburg.  +---------+---------------+---------+-----------+----------+-------------------+ RIGHT    CompressibilityPhasicitySpontaneityPropertiesThrombus Aging      +---------+---------------+---------+-----------+----------+-------------------+ CFV      Full           Yes      Yes                                       +---------+---------------+---------+-----------+----------+-------------------+ SFJ      Full                                                             +---------+---------------+---------+-----------+----------+-------------------+ FV Prox  Full                                                             +---------+---------------+---------+-----------+----------+-------------------+ FV Mid   Full                                                             +---------+---------------+---------+-----------+----------+-------------------+ FV DistalFull                                                             +---------+---------------+---------+-----------+----------+-------------------+  PFV      Full                                                             +---------+---------------+---------+-----------+----------+-------------------+ POP      Full           Yes      Yes                                      +---------+---------------+---------+-----------+----------+-------------------+ PTV      Full                                                             +---------+---------------+---------+-----------+----------+-------------------+ PERO     Partial                                      acute DVT in one of                                                       the paired peroneal                                                       veins               +---------+---------------+---------+-----------+----------+-------------------+   +---------+---------------+---------+-----------+----------+--------------+ LEFT     CompressibilityPhasicitySpontaneityPropertiesThrombus Aging +---------+---------------+---------+-----------+----------+--------------+ CFV      Full           Yes      Yes                                 +---------+---------------+---------+-----------+----------+--------------+ SFJ      Full                                                         +---------+---------------+---------+-----------+----------+--------------+ FV Prox  Full                                                        +---------+---------------+---------+-----------+----------+--------------+ FV Mid   Full                                                        +---------+---------------+---------+-----------+----------+--------------+  FV DistalFull                                                        +---------+---------------+---------+-----------+----------+--------------+ PFV      Full                                                        +---------+---------------+---------+-----------+----------+--------------+ POP      Full           Yes      Yes                                 +---------+---------------+---------+-----------+----------+--------------+ PTV      Full                                                        +---------+---------------+---------+-----------+----------+--------------+ PERO     Full                                                        +---------+---------------+---------+-----------+----------+--------------+     Summary: BILATERAL: -No evidence of popliteal cyst, bilaterally. RIGHT: - Findings consistent with acute deep vein thrombosis involving the One of the paired peroneal veins.   LEFT: - No evidence of deep vein thrombosis in the lower extremity. No indirect evidence of obstruction proximal to the inguinal ligament.   *See table(s) above for measurements and observations. Electronically signed by Coral Else MD on 01/11/2023 at 8:38:35 PM.    Final    DG Abd 1 View  Result Date: 01/11/2023 CLINICAL DATA:  244010 Constipation by delayed colonic transit 272536 EXAM: ABDOMEN - 1 VIEW COMPARISON:  January 02, 2023 FINDINGS: Air and stool-filled nondilated loops of bowel. Moderate colonic stool burden diffusely throughout the colon. Visualized lung  bases are unremarkable. Degenerative changes of the lumbar spine. Dextroscoliosis of the lumbar spine with chronic wedging of several vertebral bodies. Status post RIGHT hip arthroplasty. Degenerative changes of the LEFT hip. IMPRESSION: Nonobstructive bowel gas pattern. Electronically Signed   By: Meda Klinefelter M.D.   On: 01/11/2023 10:10   No results for input(s): "WBC", "HGB", "HCT", "PLT" in the last 72 hours. Recent Labs    01/09/23 1926  NA 136  K 4.8  CL 99  CO2 27  GLUCOSE 170*  BUN 22  CREATININE 0.90  CALCIUM 8.9    Intake/Output Summary (Last 24 hours) at 01/12/2023 1351 Last data filed at 01/12/2023 0715 Gross per 24 hour  Intake 358 ml  Output 2150 ml  Net -1792 ml        Physical Exam: Vital Signs Blood pressure 119/73, pulse 72, temperature 97.9 F (36.6 C), resp. rate 15, weight 72.8 kg, SpO2 100%.    General: awake, alert, appropriate, sitting up- propped with  pillows; NAD HENT: conjugate gaze; oropharynx moist CV: regular rate and rhythm; no JVD Pulmonary: CTA B/L; no W/R/R- good air movement GI: soft, NT, slightly to somewhat distended- better than yesterday; , (+)BS Psychiatric: appropriate- brighter spirits Neurological: Ox3 No increased tone so far Extremities: no Swelling in LE's Neurological:     Mental Status: He is alert.     Comments: Alert and oriented x 3. Normal insight and awareness. Intact Memory. Normal language and speech. Cranial nerve exam unremarkable. MMT: 5/5 UE, 0/5 bilateral LE's. No sensation to LT or pain from approximately T8/9. LE DTR's 1-2+ bilaterally. Mild extensor tone RLE with 1-2 beats of clonus appreciated. .   Assessment/Plan: 1. Functional deficits which require 3+ hours per day of interdisciplinary therapy in a comprehensive inpatient rehab setting. Physiatrist is providing close team supervision and 24 hour management of active medical problems listed below. Physiatrist and rehab team continue to assess  barriers to discharge/monitor patient progress toward functional and medical goals  Care Tool:  Bathing    Body parts bathed by patient: Face, Left arm, Right arm, Chest   Body parts bathed by helper: Front perineal area, Buttocks, Right upper leg, Left upper leg, Left lower leg, Right lower leg, Abdomen, Left arm, Face     Bathing assist Assist Level: Total Assistance - Patient < 25%     Upper Body Dressing/Undressing Upper body dressing   What is the patient wearing?: Button up shirt    Upper body assist Assist Level: Total Assistance - Patient < 25%    Lower Body Dressing/Undressing Lower body dressing      What is the patient wearing?: Pants     Lower body assist Assist for lower body dressing: Dependent - Patient 0%     Toileting Toileting Toileting Activity did not occur (Clothing management and hygiene only): N/A (no void or bm)  Toileting assist Assist for toileting: Dependent - Patient 0%     Transfers Chair/bed transfer  Transfers assist  Chair/bed transfer activity did not occur: Safety/medical concerns        Locomotion Ambulation   Ambulation assist   Ambulation activity did not occur: Safety/medical concerns          Walk 10 feet activity   Assist  Walk 10 feet activity did not occur: Safety/medical concerns        Walk 50 feet activity   Assist Walk 50 feet with 2 turns activity did not occur: Safety/medical concerns         Walk 150 feet activity   Assist Walk 150 feet activity did not occur: Safety/medical concerns         Walk 10 feet on uneven surface  activity   Assist Walk 10 feet on uneven surfaces activity did not occur: Safety/medical concerns         Wheelchair     Assist Is the patient using a wheelchair?: Yes Type of Wheelchair: Manual Wheelchair activity did not occur: Safety/medical concerns         Wheelchair 50 feet with 2 turns activity    Assist    Wheelchair 50 feet with  2 turns activity did not occur: Safety/medical concerns       Wheelchair 150 feet activity     Assist  Wheelchair 150 feet activity did not occur: Safety/medical concerns       Blood pressure 119/73, pulse 72, temperature 97.9 F (36.6 C), resp. rate 15, weight 72.8 kg, SpO2 100%.   Medical Problem List and  Plan: 1. 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  Status post ORIF T7 and T9 01/03/2023.  Back brace when out of bed             -patient may shower             -ELOS/Goals: ~28 days at min to mod assist w/c level, reduce burden of care              -ordered PRAFO's to replace prevalon boots  Con't CIR PT and OT  New DVT  D/c foley and start bowel program/bladder program  -[facility] 2.  Antithrombotics: Lovenox added for DVT prophylaxis 01/09/2023. -  9/22- new DVT peroneal- changed Lovenox to Eliquis- DVT/PE guidelines dosing             -antiplatelet therapy: N/A 3. Pain Management: Oxycodone as needed  9/21- very little pain- con't regimen 4. Mood/Behavior/Sleep: Provide emotional support             -pt in reasonable spirits at present.              -neuropsych eval, consider antidepressant             -antipsychotic agents: N/A 5. Neuropsych/cognition: This patient is capable of making decisions on his own behalf. 6. Skin/Wound Care: Routine skin checks, turning, air matress             -adequate nutrition 7. Fluids/Electrolytes/Nutrition: Routine in and outs with follow-up chemistries             -pt appears to have good appetite 8.  Neurogenic bowel:             -had bm 9/19 -initiate bowel program tonight with Dulcolax suppository in evening and MiraLAX qam. Adjust as needed 9/21- KUB looks well- abdomen is still distended; will con't bowel program and see if can get pt's abd less distended 9/22- med plus large BM with bowel program 9. Neurogenic bladder:             -continue foley tonight             -will need I/O cath  regimen  9/21- will d/c foley and start in/out cathing q6 hours, regardless, because is complete injury.  10. .  Hypothyroidism.  Synthroid  11. At level SCI pain- tightness  9/21- pt wants ot wait on Nerve pain meds 12. B/L foot drop  9/21- will cont PRAFOs- that received yesterday     I spent a total of 50   minutes on total care today- >50% coordination of care- due to  Doing IPOC_ d/w nursing about overnight- went over cathing and new DVT- education on this and on Eliquis now for 3+ months.   LOS: 2 days A FACE TO FACE EVALUATION WAS PERFORMED  Jaton Eilers 01/12/2023, 1:51 PM

## 2023-01-13 ENCOUNTER — Other Ambulatory Visit (HOSPITAL_COMMUNITY): Payer: Self-pay

## 2023-01-13 DIAGNOSIS — G8221 Paraplegia, complete: Secondary | ICD-10-CM | POA: Diagnosis not present

## 2023-01-13 DIAGNOSIS — K592 Neurogenic bowel, not elsewhere classified: Secondary | ICD-10-CM | POA: Diagnosis not present

## 2023-01-13 DIAGNOSIS — N319 Neuromuscular dysfunction of bladder, unspecified: Secondary | ICD-10-CM | POA: Diagnosis not present

## 2023-01-13 LAB — CBC WITH DIFFERENTIAL/PLATELET
Abs Immature Granulocytes: 0.09 10*3/uL — ABNORMAL HIGH (ref 0.00–0.07)
Basophils Absolute: 0 10*3/uL (ref 0.0–0.1)
Basophils Relative: 0 %
Eosinophils Absolute: 0.2 10*3/uL (ref 0.0–0.5)
Eosinophils Relative: 2 %
HCT: 42 % (ref 39.0–52.0)
Hemoglobin: 14.4 g/dL (ref 13.0–17.0)
Immature Granulocytes: 1 %
Lymphocytes Relative: 11 %
Lymphs Abs: 1 10*3/uL (ref 0.7–4.0)
MCH: 33.6 pg (ref 26.0–34.0)
MCHC: 34.3 g/dL (ref 30.0–36.0)
MCV: 97.9 fL (ref 80.0–100.0)
Monocytes Absolute: 0.9 10*3/uL (ref 0.1–1.0)
Monocytes Relative: 10 %
Neutro Abs: 7.6 10*3/uL (ref 1.7–7.7)
Neutrophils Relative %: 76 %
Platelets: 298 10*3/uL (ref 150–400)
RBC: 4.29 MIL/uL (ref 4.22–5.81)
RDW: 13.2 % (ref 11.5–15.5)
WBC: 9.8 10*3/uL (ref 4.0–10.5)
nRBC: 0 % (ref 0.0–0.2)

## 2023-01-13 LAB — COMPREHENSIVE METABOLIC PANEL WITH GFR
ALT: 57 U/L — ABNORMAL HIGH (ref 0–44)
AST: 39 U/L (ref 15–41)
Albumin: 2.8 g/dL — ABNORMAL LOW (ref 3.5–5.0)
Alkaline Phosphatase: 69 U/L (ref 38–126)
Anion gap: 10 (ref 5–15)
BUN: 22 mg/dL (ref 8–23)
CO2: 21 mmol/L — ABNORMAL LOW (ref 22–32)
Calcium: 8.8 mg/dL — ABNORMAL LOW (ref 8.9–10.3)
Chloride: 103 mmol/L (ref 98–111)
Creatinine, Ser: 0.82 mg/dL (ref 0.61–1.24)
GFR, Estimated: >60 mL/min
Glucose, Bld: 131 mg/dL — ABNORMAL HIGH (ref 70–99)
Potassium: 4.4 mmol/L (ref 3.5–5.1)
Sodium: 134 mmol/L — ABNORMAL LOW (ref 135–145)
Total Bilirubin: 0.9 mg/dL (ref 0.3–1.2)
Total Protein: 6.8 g/dL (ref 6.5–8.1)
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
## 2023-01-13 NOTE — Progress Notes (Signed)
Physical Therapy Session Note  Patient Details  Name: Edward Waller MRN: [id number] Date of Birth: 09/18/1932  Today's Date: 01/13/2023 PT Individual Time: 0800-0900, 1415-1530 PT Individual Time Calculation (min): 60 min, 75 min   Short Term Goals: Week 1:  PT Short Term Goal 1 (Week 1): Pt will maintain sitting balance with CGA consistently PT Short Term Goal 2 (Week 1): Pt will be able to recall timing and initiate pressure relief independently, or request assist as needed PT Short Term Goal 3 (Week 1): Pt will initiate w/c propulsion/navigation  Skilled Therapeutic Interventions/Progress Updates:    Session 1: pt received in bed and agreeable to therapy. No complaint of pain. Donned ted hose, shorts, and leg loops tot A for time. TLSO donned tot A for time.  Educated pt on care direction and how therapist assists with rolling. Supine>sit with max for BLE management and mod trunk elevation.  slideboard transfer x 3 with heavy mod-max a with + 2 standing by for safety. Sitting balance EOM with CGA-min. slideboard transfer with pt demoing improving sequencing and body mechanics. Pt remained in TIS w/c at end of session with legs elevated for BP management.   Session 2: Pt seated in w/c on arrival and agreeable to therapy. No complaint of pain.   Pt transported outside to [facility] entrance for therapeutic benefit of fresh air and new scenery.  Pt performed the following exercises to promote LE strength and endurance:  -bicep curl using leg in leg loop for resistance to promote strength for functional LE management, 3 x 20  -sit ups using first leg loops then arm rests for functional core strength, 2 x 10 ea -RTB hor abduction with chair ~90deg upright with guarding for balance and intermittent LOB which pt can correct. 2 x 10 -w/c isometric pushups on armrests 3 x 10, not fully clearing buttocks.   Returned to unit and pt performed near mod A slideboard transfer to bed and max A x 1  sit>supine. Pt handed off to nsg for cathing.   Therapy Documentation Precautions:  Precautions Precautions: Back Precaution Booklet Issued: No Precaution Comments: Abdominal binder and thigh High ted hose for BP management for OOB. Required Braces or Orthoses: Spinal Brace Spinal Brace: Thoracolumbosacral orthotic, Applied in sitting position Restrictions Weight Bearing Restrictions: No General:       Therapy/Group: Individual Therapy  [name] 01/13/2023, 12:53 PM

## 2023-01-13 NOTE — Progress Notes (Signed)
IP Rehab Bowel Program Documentation   Bowel Program Start time [phone]  Dig Stim Indicated? Yes  Dig Stim Prior to Suppository or mini Enema X 1   Output from dig stim: Minimal  Ordered intervention: Suppository Yes , mini enema No ,   Repeat dig stim after Suppository or Mini enema  X 1,  Output? Moderate   Bowel Program Complete? Yes , handoff given Yes   Patient Tolerated? Yes
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
## 2023-01-13 NOTE — Progress Notes (Signed)
Bowel Program completed.  Repeated dig stim  Output? Smear   Patient tolerated well.
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
## 2023-01-13 NOTE — Care Management (Signed)
[facility]patient [facility] Individual Statement of Services  Patient Name:  WYAT KOSICK  Date:  01/13/2023  Welcome to the [facility]patient Rehabilitation [facility].  Our goal is to provide you with an individualized program based on your diagnosis and situation, designed to meet your specific needs.  With this comprehensive rehabilitation program, you will be expected to participate in at least 3 hours of rehabilitation therapies Monday-Friday, with modified therapy programming on the weekends.  Your rehabilitation program will include the following services:  Physical Therapy (PT), Occupational Therapy (OT), 24 hour per day rehabilitation nursing, Therapeutic Recreaction (TR), Psychology, Neuropsychology, Care Coordinator, Rehabilitation Medicine, Nutrition Services, Pharmacy Services, and Other  Weekly team conferences will be held on Tuesdays to discuss your progress.  Your Inpatient Rehabilitation Care Coordinator will talk with you frequently to get your input and to update you on team discussions.  Team conferences with you and your family in attendance may also be held.  Expected length of stay: 4 weeks    Overall anticipated outcome: Minimal Assistance  Depending on your progress and recovery, your program may change. Your Inpatient Rehabilitation Care Coordinator will coordinate services and will keep you informed of any changes. Your Inpatient Rehabilitation Care Coordinator's name and contact numbers are listed  below.  The following services may also be recommended but are not provided by the Inpatient Rehabilitation Center:  Driving Evaluations Home Health Rehabiltiation Services Outpatient Rehabilitation Services Vocational Rehabilitation   Arrangements will be made to provide these services after discharge if needed.  Arrangements include referral to agencies that provide these services.  Your insurance has been verified to be:  [employer]  Your primary doctor  is:  [facility]  Pertinent information will be shared with your doctor and your insurance company.  Inpatient Rehabilitation Care Coordinator:  Susie Cassette [phone] or [phone]  Information discussed with and copy given to patient by: Gretchen Short, 01/13/2023, 10:38 AM
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
## 2023-01-13 NOTE — Progress Notes (Signed)
Patient ID: Edward Waller, male   DOB: 01/02/1933, 89 y.o.   MRN: [id number]  [phone]- SW spoke with [name] to introduce self, explain role, discuss discharge process, and inform on ELOS. She reports she will be primary caregiver, however, she is not physically able to care for him and believes he may require placement at discharge. She states her dtr can help PRN but cares for her husband with dementia. She is aware SW will explore pt VA benefits and see what options are available. She is aware SW will follow-up with updates on Wednesday when SW returns to office.   SW sent H&P to [facility], and waiting on updates about VA service connection.   Cecile Sheerer, MSW, LCSWA Office: [phone] Cell: [phone] Fax: [phone]

## 2023-01-13 NOTE — Progress Notes (Signed)
Occupational Therapy Session Note  Patient Details  Name: Edward Waller MRN: [id number] Date of Birth: [birth year]  Today's Date: 01/13/2023 OT Individual Time: 8657-8469 OT Individual Time Calculation (min): 70 min    Short Term Goals: Week 1:  OT Short Term Goal 1 (Week 1): Pt will recall presure release schedule with min verbal cues OT Short Term Goal 2 (Week 1): Pt will tolerated sitting up in wc for 30 minutes or greater OT Short Term Goal 3 (Week 1): Pt will maintain sitting balance with min A consistently  Skilled Therapeutic Interventions/Progress Updates:    Pt resting in TIS upon arrival. OT intervention with focus on SB transfers and sitting balance to increase pt's independence with BADLs. SB transfer to EOM with mod A and max verbal cues for technique. Pt continues to exhibit posterior push during transfers. Pt requires max multimodal cues to correct. Pt with improved pushing through BUE during transfers. Sitting balance EOM with focus on "finding center" with and without UE support. Pt tends to have a slight posterior bias when sitting with UE support. Practiced lateral leans to Rt and LT to facilitate placement of SB prior to transfers. Pt with posterior bias during lateral leans. Block practice moving BUE from EOM to resting on knees/thighs. SB transfer back to TIS with mod A. Pt returned to room and remained in TIS. All needs withn reach. Seat belt secure.  Therapy Documentation Precautions:  Precautions Precautions: Back Precaution Booklet Issued: No Precaution Comments: Abdominal binder and thigh High ted hose for BP management for OOB. Required Braces or Orthoses: Spinal Brace Spinal Brace: Thoracolumbosacral orthotic, Applied in sitting position Restrictions Weight Bearing Restrictions: No   Pain: Pain Assessment Pain Scale: 0-10 Pain Score: 0-No pain   Therapy/Group: Individual Therapy  [name] 01/13/2023, 11:05 AM

## 2023-01-13 NOTE — Progress Notes (Signed)
Inpatient Rehabilitation  Patient information reviewed and entered into eRehab system by Oyuki Hogan M. Eri Mcevers, M.A., CCC/SLP, PPS Coordinator.  Information including medical coding, functional ability and quality indicators will be reviewed and updated through discharge.    

## 2023-01-13 NOTE — TOC Benefit Eligibility Note (Signed)
Patient Product/process development scientist completed.    The patient is insured through [employer]. Patient has Medicare and is not eligible for a copay card, but may be able to apply for patient assistance, if available.    Ran test claim for Eliquis 5 mg and the current 30 day co-pay is $312.00 due to a $265.00 deductible.  Will be $47.00 once deductible is met.   This test claim was processed through [facility]- copay amounts may vary at other pharmacies due to pharmacy/plan contracts, or as the patient moves through the different stages of their insurance plan.     Roland Earl, CPHT Pharmacy Technician III Certified Patient Advocate [facility] Patient Advocate Team Direct Number: [phone]  Fax: [phone]

## 2023-01-13 NOTE — Progress Notes (Signed)
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   Cataract    bilateral   Fx ankle    left; Dec 2021   Hip pain, chronic, right    History of kidney stones    Hypothyroidism    Liver cyst    Low back pain    OA (osteoarthritis) of hip    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    Renal cyst, left    Restless leg syndrome    Spinal stenosis    Thyroid disease    Past Surgical History:  Past Surgical History:  Procedure Laterality Date   BACK SURGERY     EYE SURGERY     bilateral cataract removal with Lens   LAMINECTOMY WITH POSTERIOR LATERAL ARTHRODESIS LEVEL 3 N/A 01/03/2023   Procedure: OPEN REDUCTION INTERNAL FIXATION THORACIC SEVEN -THORACIC NINE;  Surgeon: Coletta Memos, MD;  Location: [facility] OR;  Service: Neurosurgery;  Laterality: N/A;   TIBIA IM NAIL INSERTION Right 07/06/2021   Procedure: INTRAMEDULLARY (IM) NAIL TIBIAL;  Surgeon: Myrene Galas, MD;  Location: [facility] OR;  Service: Orthopedics;  Laterality: Right;   TOTAL HIP ARTHROPLASTY Right 03/20/2022   Procedure: TOTAL HIP ARTHROPLASTY ANTERIOR APPROACH;  Surgeon: Ollen Gross, MD;  Location: [facility] ORS;  Service: Orthopedics;  Laterality: Right;   TOTAL HIP ARTHROPLASTY Right 2023   Social History:  reports that he has never smoked. He has never used smokeless tobacco. He reports that he does not drink alcohol and does not use drugs.  Family / Support Systems Marital Status: Married How Long?: 66 years Patient Roles: Spouse, Parent Spouse/Significant Other: [name] (wife) Children: 3 children; 1 living- Dtr Thurmon Fair lives in [place] Other Supports: Tereasa [employer]op-  granddaughter Anticipated Caregiver: wife and PRN support from dtr Ability/Limitations of Caregiver: Pt to discharge to home with support from his wife but she is not able to provide physical assistance. PRN support from dtr. [facility] SNF placement. Caregiver Availability: 24/7 Family Dynamics: Pt lives with his wife.  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 Education: doctorate in theology Health Literacy - How often do you need to have someone help you when you read instructions, pamphlets, or other written material from your doctor or pharmacy?: Rarely Writes: Yes Employment Status: Retired Date Retired/Disabled/Unemployed: 2015 Marine scientist Issues: Denies Guardian/Conservator: Working on [employer]. Form in room and waiting for chaplain to sign.   Abuse/Neglect Abuse/Neglect Assessment Can Be Completed: Yes Physical Abuse: Denies Verbal Abuse: Denies Sexual Abuse: Denies Exploitation of patient/patient's resources: Denies Self-Neglect: Denies  Patient response to: Social Isolation - How often do you feel lonely or isolated from those around you?: Never  Emotional Status Pt's affect, behavior and adjustment status: Pt in good spirits at time of visit and adjusting as well as to be expected. Recent Psychosocial Issues: Denies Psychiatric History: Denies Substance Abuse History: Denies  Patient / Family Perceptions, Expectations & Goals Pt/Family understanding of illness & functional limitations: Pt and family have a general understanding of pt care needs Premorbid pt/family roles/activities: Independent Anticipated  changes in roles/activities/participation: Assistance with ADLs/IADLs Pt/family expectations/goals: Pt goal is to work on getting back to where he was before he fell and be able to get out of bed and have a little  balance.  Community Resources [employer]: None Premorbid Home Care/DME Agencies: None Transportation available at discharge: TBD Is the patient able to respond to transportation needs?: Yes In the past 12 months, has lack of transportation kept you from medical appointments or from getting medications?: No In the past 12 months, has lack of transportation kept you from meetings, work, or from getting things needed for daily living?: No Resource referrals recommended: Neuropsychology  Discharge Planning Living Arrangements: Spouse/significant other Support Systems: Spouse/significant other, Children Type of Residence: Private residence Insurance Resources: Media planner (specify) ([employer] Medicare) Financial Resources: Social Security Financial Screen Referred: No Living Expenses: Own Money Management: Patient, Spouse Does the patient have any problems obtaining your medications?: No Home Management: Both manage all homecare needs Patient/Family Preliminary Plans: TBD Care Coordinator Barriers to Discharge: Decreased caregiver support, Lack of/limited family support, Insurance for SNF coverage Care Coordinator Anticipated Follow Up Needs: HH/OP Expected length of stay: 4 weeks  Clinical Impression SW met with pt at bedside in room to introduce self, explain role, discuss discharge process, and inform on ELOS. DME: rollator and RW.   Edward Waller A Edward Waller 01/13/2023, 4:09 PM

## 2023-01-14 DIAGNOSIS — G8221 Paraplegia, complete: Secondary | ICD-10-CM | POA: Diagnosis not present

## 2023-01-14 MED ORDER — ENSURE ENLIVE PO LIQD
237.0000 mL | Freq: Three times a day (TID) | ORAL | Status: DC
  Administered 2023-01-14 – 2023-02-07 (×61): 237 mL via ORAL

## 2023-01-14 MED ORDER — ADULT MULTIVITAMIN W/MINERALS CH
1.0000 | ORAL_TABLET | Freq: Every day | ORAL | Status: DC
  Administered 2023-01-15 – 2023-02-13 (×30): 1 via ORAL
  Filled 2023-01-14 (×31): qty 1

## 2023-01-14 NOTE — Progress Notes (Signed)
Patient ID: Edward Waller, male   DOB: 1932/10/28, 89 y.o.   MRN: [id number]  Met with pt to give him the team conference update regarding goals of min-mod wheelchair level. With a target discharge date of 10/18. He has spoken with MD regarding [facility] program [facility] [place] and do want her to pursue this option with the people she knows. Pt happy he can see progress and needs to focus on this and keep moving forward. He does realize he will need to go to another facility after here due to his wife is not able to physically assist him due to her own health issues. Auria-primary Child psychotherapist to return tomorrow pt aware of this.

## 2023-01-14 NOTE — Progress Notes (Signed)
Physical Therapy Session Note  Patient Details  Name: Edward Waller MRN: [id number] Date of Birth: October 17, 1932  Today's Date: 01/14/2023 PT Individual Time: 0800-0915, 1191-4782 PT Individual Time Calculation (min): 75 min, 50 min   Short Term Goals: Week 1:  PT Short Term Goal 1 (Week 1): Pt will maintain sitting balance with CGA consistently PT Short Term Goal 2 (Week 1): Pt will be able to recall timing and initiate pressure relief independently, or request assist as needed PT Short Term Goal 3 (Week 1): Pt will initiate w/c propulsion/navigation  Skilled Therapeutic Interventions/Progress Updates:    Session 1: pt received in bed and agreeable to therapy. Pt c/o at level SCI pain, rest and positioning as needed.   Donned teds, shorts, leg loops tot A for time. Bed mobility with leg loops with mod and increased time with verbal cueing.   Mod-Max slideboard transfer x 2 with +2 for safety. Majority of session spent EOM with focus on pushups from mat able with emphasis on forward trunk position. After interventions and training, transfer back to w/c with pt producing increased clearance vs start of session, will continue to need cueing and training for consistent, full clearance.   Pt returned to room and remained in TIS for continued upright tolerance with all needs in reach.   Session 2: Pt seated in w/c on arrival and agreeable to therapy. Continued SCI pain, pt denying pain medication, rest and positioning as needed. Pt transported to therapy gym for time management and energy conservation.   slideboard transfer x 3 with heavy mod for hip clearance. Pt with improving anterior lean and head hips relationship but still not producing adequate clearance without assist.   Pt then participated in sitting balance and reaction training for propping back on arms, lateral elbow, and anterior elbows on knees. Improving sitting balance and balance reactions.   Pt then returned to bed with max  A for BLE and was able to pull up with min A. Pt was left with all needs in reach.   Therapy Documentation Precautions:  Precautions Precautions: Back Precaution Booklet Issued: No Precaution Comments: Abdominal binder and thigh High ted hose for BP management for OOB. Required Braces or Orthoses: Spinal Brace Spinal Brace: Thoracolumbosacral orthotic, Applied in sitting position Restrictions Weight Bearing Restrictions: No General:       Therapy/Group: Individual Therapy  [name] 01/14/2023, 12:49 PM

## 2023-01-14 NOTE — Progress Notes (Signed)
Occupational Therapy Session Note  Patient Details  Name: KIRIN BUELTEL MRN: [id number] Date of Birth: 1933/04/18  Today's Date: 01/14/2023 OT Individual Time: 1000-1055 OT Individual Time Calculation (min): 55 min    Short Term Goals: Week 1:  OT Short Term Goal 1 (Week 1): Pt will recall presure release schedule with min verbal cues OT Short Term Goal 2 (Week 1): Pt will tolerated sitting up in wc for 30 minutes or greater OT Short Term Goal 3 (Week 1): Pt will maintain sitting balance with min A consistently  Skilled Therapeutic Interventions/Progress Updates:    OT intervention with focus on SB transfers, sitting balance, lateral leans, and activity tolerance to increase independence with BADLs and transfers. SB transfers with mod A/max A with max multimodal cues for positioning and forward lean during transfer. Block practice of lateral leans to R/L. With repetitiion pt able to complete lateral leans with CGA and mod verbal cues. Block practice of sitting balance without UE support with max A. Pt returned to w/c and remained in w/c in room. All needs within reach.   Therapy Documentation Precautions:  Precautions Precautions: Back Precaution Booklet Issued: No Precaution Comments: Abdominal binder and thigh High ted hose for BP management for OOB. Required Braces or Orthoses: Spinal Brace Spinal Brace: Thoracolumbosacral orthotic, Applied in sitting position Restrictions Weight Bearing Restrictions: No   Pain:  Pt denies pain this morning   Therapy/Group: Individual Therapy  [name] 01/14/2023, 11:10 AM

## 2023-01-14 NOTE — Progress Notes (Signed)
IP Rehab Bowel Program Documentation   Bowel Program Start time 1930   Dig Stim Indicated? Yes  Dig Stim Prior to Suppository or mini Enema X 1   Output from dig stim: Large  Ordered intervention: Suppository Yes , mini enema No ,   Repeat dig stim after Suppository or Mini enema  X {Numbers; 1-5:17750},  Output? {Desc; minimal/small/moderate/large/very large:110034}   Bowel Program Complete? {YES/NO:21197}, handoff given ***  Patient Tolerated? {YES/NO:21197}
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
## 2023-01-14 NOTE — Progress Notes (Signed)
Occupational Therapy Session Note  Patient Details  Name: Edward Waller MRN: [id number] Date of Birth: 1932/09/21  Today's Date: 01/14/2023 OT Individual Time: 1130-1200 OT Individual Time Calculation (min): 30 min    Short Term Goals: Week 1:  OT Short Term Goal 1 (Week 1): Pt will recall presure release schedule with min verbal cues OT Short Term Goal 2 (Week 1): Pt will tolerated sitting up in wc for 30 minutes or greater OT Short Term Goal 3 (Week 1): Pt will maintain sitting balance with min A consistently  Skilled Therapeutic Interventions/Progress Updates:    Pt resting in w/c upon arrival. Per MD, chest attachment to TLSO removed. OT intervention with focus on BUE therex for strengthening and increasing independence with SB transfers and endurance. Circuit exercise with 3# bar 3x10. Pt remained in w/c with all needs within reach.   Therapy Documentation Precautions:  Precautions Precautions: Back Precaution Booklet Issued: No Precaution Comments: Abdominal binder and thigh High ted hose for BP management for OOB. Required Braces or Orthoses: Spinal Brace Spinal Brace: Thoracolumbosacral orthotic, Applied in sitting position Restrictions Weight Bearing Restrictions: No   Pain:  Pt denies pain this morning    Therapy/Group: Individual Therapy  [name] 01/14/2023, 12:03 PM

## 2023-01-14 NOTE — Progress Notes (Signed)
   01/14/23 1600  Spiritual Encounters  Type of Visit Initial  Care provided to: Patient  Conversation partners present during encounter Nurse  Referral source Patient request  Reason for visit Advance directives  OnCall Visit No   Ch responded to request for AD. Family was present at bedside. Ch provided pt with the right form for AD. He said his granddaughter will fill it out and get it ready by tomorrow afternoon. Ch will follow-up with pt tomorrow.
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
## 2023-01-14 NOTE — Progress Notes (Addendum)
Met with patient. Team conference is every Tuesday.  Will discuss discharge date, barriers, goals, and equipment needs. Reports wife was a Engineer, civil (consulting). Discussed bowel/bladder program and would like for him to be able to manage his bladder on his on. Discussed bowel program and reason for bowel program. Nursing will work with him with goal of independence. Encourage fluid intact and got pitcher for patient. Taking eliquis. Has left blister to buttocks and will provide wound care plan.  Notified nurse.  Updated Worklist to begin education on bowel and bladder training.
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
## 2023-01-15 ENCOUNTER — Inpatient Hospital Stay (HOSPITAL_COMMUNITY): Payer: No Typology Code available for payment source

## 2023-01-15 DIAGNOSIS — G8221 Paraplegia, complete: Secondary | ICD-10-CM | POA: Diagnosis not present

## 2023-01-15 LAB — CBC WITH DIFFERENTIAL/PLATELET
Abs Immature Granulocytes: 0.13 10*3/uL — ABNORMAL HIGH (ref 0.00–0.07)
Basophils Absolute: 0 10*3/uL (ref 0.0–0.1)
Basophils Relative: 0 %
Eosinophils Absolute: 0 10*3/uL (ref 0.0–0.5)
Eosinophils Relative: 0 %
HCT: 38.8 % — ABNORMAL LOW (ref 39.0–52.0)
Hemoglobin: 13.5 g/dL (ref 13.0–17.0)
Immature Granulocytes: 1 %
Lymphocytes Relative: 1 %
Lymphs Abs: 0.3 10*3/uL — ABNORMAL LOW (ref 0.7–4.0)
MCH: 33.2 pg (ref 26.0–34.0)
MCHC: 34.8 g/dL (ref 30.0–36.0)
MCV: 95.3 fL (ref 80.0–100.0)
Monocytes Absolute: 0.2 10*3/uL (ref 0.1–1.0)
Monocytes Relative: 1 %
Neutro Abs: 20.8 10*3/uL — ABNORMAL HIGH (ref 1.7–7.7)
Neutrophils Relative %: 97 %
Platelets: 311 10*3/uL (ref 150–400)
RBC: 4.07 MIL/uL — ABNORMAL LOW (ref 4.22–5.81)
RDW: 13.1 % (ref 11.5–15.5)
WBC: 21.4 10*3/uL — ABNORMAL HIGH (ref 4.0–10.5)
nRBC: 0 % (ref 0.0–0.2)

## 2023-01-15 LAB — LACTIC ACID, PLASMA
Lactic Acid, Venous: 2.1 mmol/L (ref 0.5–1.9)
Lactic Acid, Venous: 2 mmol/L (ref 0.5–1.9)

## 2023-01-15 LAB — BASIC METABOLIC PANEL
Anion gap: 9 (ref 5–15)
BUN: 25 mg/dL — ABNORMAL HIGH (ref 8–23)
CO2: 23 mmol/L (ref 22–32)
Calcium: 8.8 mg/dL — ABNORMAL LOW (ref 8.9–10.3)
Chloride: 100 mmol/L (ref 98–111)
Creatinine, Ser: 1.15 mg/dL (ref 0.61–1.24)
GFR, Estimated: >60 mL/min (ref >60)
Glucose, Bld: 133 mg/dL — ABNORMAL HIGH (ref 70–99)
Potassium: 4.6 mmol/L (ref 3.5–5.1)
Sodium: 132 mmol/L — ABNORMAL LOW (ref 135–145)

## 2023-01-15 LAB — URINALYSIS, ROUTINE W REFLEX MICROSCOPIC
Bilirubin Urine: NEGATIVE
Glucose, UA: NEGATIVE mg/dL
Ketones, ur: NEGATIVE mg/dL
Nitrite: POSITIVE — AB
Protein, ur: NEGATIVE mg/dL
Specific Gravity, Urine: 1.014 (ref 1.005–1.030)
WBC, UA: >50 WBC/hpf (ref 0–5)
pH: 5 (ref 5.0–8.0)

## 2023-01-15 LAB — PROCALCITONIN: Procalcitonin: 4.08 ng/mL

## 2023-01-15 MED ORDER — SODIUM CHLORIDE 0.9 % IV SOLN: INTRAVENOUS | Status: DC

## 2023-01-15 MED ORDER — SODIUM CHLORIDE 0.9 % IV SOLN
1.0000 g | INTRAVENOUS | Status: AC
  Administered 2023-01-15 – 2023-01-19 (×5): 1 g via INTRAVENOUS
  Filled 2023-01-15 (×5): qty 10

## 2023-01-15 NOTE — Progress Notes (Signed)
Physical Therapy Session Note  Patient Details  Name: Edward Waller MRN: [id number] Date of Birth: 03-23-1933  Today's Date: 01/15/2023 PT Individual Time: 0800-0857 PT Individual Time Calculation (min): 57 min   Short Term Goals: Week 1:  PT Short Term Goal 1 (Week 1): Pt will maintain sitting balance with CGA consistently PT Short Term Goal 2 (Week 1): Pt will be able to recall timing and initiate pressure relief independently, or request assist as needed PT Short Term Goal 3 (Week 1): Pt will initiate w/c propulsion/navigation  Skilled Therapeutic Interventions/Progress Updates:      Therapy Documentation Precautions:  Precautions Precautions: Back Precaution Booklet Issued: No Precaution Comments: Abdominal binder and thigh High ted hose for BP management for OOB. Required Braces or Orthoses: Spinal Brace Spinal Brace: Thoracolumbosacral orthotic, Applied in sitting position Restrictions Weight Bearing Restrictions: No  Pt received semi-reclined in bed and reports "rough night" due to nightmare and feeling ill. Pt reports HA, nausea, dry mouth, anxiety, and "helplessness" yesterday night following his bowel program. Pt denies sweating, chills, itchy scalp or nasal congestion. Symptom duration ~30 minutes.   Vitals assessed and BP recorded as 112/51 with pulse of 87. Nurse and MD informed and PT educated pt on autonomic dysfunction. Pt encouraged to use call bell to notify staff of re-occurrence of symptoms. Pt educated to find noxious stimuli (bowel and bladder related, tight clothing, hang nail, etc.). Pt receptive to education and reports he feels better after discussion.   Pt left semi-reclined in bed with all needs in reach.     Therapy/Group: Individual Therapy  Truitt Leep Truitt Leep PT, DPT  01/15/2023, 7:51 AM

## 2023-01-15 NOTE — Progress Notes (Signed)
IP Rehab Bowel Program Documentation   Bowel Program Start time 1815  Dig Stim Indicated? Yes  Dig Stim Prior to Suppository or mini Enema X 2   Output from dig stim: Small  Ordered intervention: Suppository    Repeat dig stim after Suppository or Mini enema  X 2,  Output? Small   Bowel Program Complete? No , handoff given Doug LPN  Patient Tolerated? Yes

## 2023-01-15 NOTE — Progress Notes (Signed)
Date and time results received: 01/15/23 1718 (use smartphrase ".now" to insert current time)  Test: lactic Acid Critical Value: 2.1  Name of Provider Notified: Deatra Ina, PA   Orders Received? Or Actions Taken?: No new orders at this time.  Marylu Lund, RN

## 2023-01-15 NOTE — Progress Notes (Signed)
Occupational Therapy Session Note  Patient Details  Name: Edward Waller MRN: [id number] Date of Birth: 1932/11/16  Today's Date: 01/15/2023 OT Individual Time: 1300-1320 OT Individual Time Calculation (min): 20 min  and Today's Date: 01/15/2023 OT Missed Time: 25 Minutes Missed Time Reason: Patient fatigue;Patient ill (comment) (Pt with running fever and fatigue)   Short Term Goals: Week 1:  OT Short Term Goal 1 (Week 1): Pt will recall presure release schedule with min verbal cues OT Short Term Goal 2 (Week 1): Pt will tolerated sitting up in wc for 30 minutes or greater OT Short Term Goal 3 (Week 1): Pt will maintain sitting balance with min A consistently  Skilled Therapeutic Interventions/Progress Updates:      Therapy Documentation Precautions:  Precautions Precautions: Back Precaution Booklet Issued: No Precaution Comments: Abdominal binder and thigh High ted hose for BP management for OOB. Required Braces or Orthoses: Spinal Brace Spinal Brace: Thoracolumbosacral orthotic, Applied in sitting position Restrictions Weight Bearing Restrictions: No General: General OT Amount of Missed Time: 25 Minutes  "It's just been a long day." Pt supine in bed upon OT arrival, agreeable to OT session.  Pain: no pain, although fatigue from fever and nausea   Other Treatments: Pt and OT participated in therapeutic conversation and education of energy conservation strategies when participating in therapy, deep breathing in order to settle nausea and positioning. Pt demonstrating verbal understanding of education provided during session.   Pt supine in bed with bed alarm activated, 2 bed rails up, call light within reach and 4Ps assessed. Handoff to nursing for cathing and medication.  Therapy/Group: Individual Therapy Velia Meyer, OTD, OTR/L 01/15/2023, 1:32 PM
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
## 2023-01-15 NOTE — Progress Notes (Signed)
   01/15/23 1300  Spiritual Encounters  Type of Visit Follow up  Care provided to: Pt and family  Referral source Patient request  Reason for visit Advance directives  OnCall Visit No   Ch followed-up with pt. He has not filled out the form yet. He said he is waiting for his grand daughter. Pt will page Ch when he is ready. No follow-up needed at this time.
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
## 2023-01-15 NOTE — Progress Notes (Signed)
RN to bedside to eval pt for PIV placement. Primary RN at bedside and had successfully started PIV at this time. Encouraged primary RN to reach out with any further concerns.

## 2023-01-15 NOTE — Progress Notes (Signed)
Physical Therapy Session Note  Patient Details  Name: Edward Waller MRN: [id number] Date of Birth: 06-27-1932  Today's Date: 01/15/2023 PT Individual Time: 1137-1200 PT Individual Time Calculation (min): 23 min   Short Term Goals: Week 1:  PT Short Term Goal 1 (Week 1): Pt will maintain sitting balance with CGA consistently PT Short Term Goal 2 (Week 1): Pt will be able to recall timing and initiate pressure relief independently, or request assist as needed PT Short Term Goal 3 (Week 1): Pt will initiate w/c propulsion/navigation  Skilled Therapeutic Interventions/Progress Updates: Pt presented in TIS with NT present. Pt noted to be shaking and NT currently taking vitals BP 174/104 with temp 100.4. Discussed possibility of onset of AD. Pt had been in/out cath within 2 hours, denies any awareness of discomfort. Per dicussion with nsg upon entry in room pt set up for transfer to bed. Pt was able to scoot anteriorly with minA in TIS and required total A for Slide board set up. Pt completed Slide board transfer to R with heavy modA. Pt required maxA for sit to supine, and was able to complete rolling L/R with maxA to ensure that pad was smoothed out as well as no clothing bunched off. Leg loops and TED hose was removed and heels floated. BP checked again and noted 160/90 Pt left resting comfortable at end of session with call bell within reach and RN entering room.      Therapy Documentation Precautions:  Precautions Precautions: Back Precaution Booklet Issued: No Precaution Comments: Abdominal binder and thigh High ted hose for BP management for OOB. Required Braces or Orthoses: Spinal Brace Spinal Brace: Thoracolumbosacral orthotic, Applied in sitting position Restrictions Weight Bearing Restrictions: No General:   Vital Signs: Therapy Vitals Temp: (!) 100.4 F (38 C) Temp Source: Oral Pulse Rate: (!) 111 BP: (!) 160/92 Patient Position (if appropriate): Sitting Oxygen  Therapy SpO2: 97 % Pain: Pain Assessment Pain Score: 0-No pain  Therapy/Group: Individual Therapy  [name] 01/15/2023, 12:56 PM

## 2023-01-15 NOTE — Progress Notes (Signed)
   01/15/23 1144  Assess: MEWS Score  Temp (!) 100.4 F (38 C)  BP (!) 174/104  MAP (mmHg) 123  Pulse Rate (!) 111  SpO2 97 %  Assess: MEWS Score  MEWS Temp 0  MEWS Systolic 0  MEWS Pulse 2  MEWS RR 0  MEWS LOC 0  MEWS Score 2  MEWS Score Color Yellow  Assess: if the MEWS score is Yellow or Red  Were vital signs accurate and taken at a resting state? Yes  Does the patient meet 2 or more of the SIRS criteria? Yes  Does the patient have a confirmed or suspected source of infection? No  MEWS guidelines implemented  Yes, yellow  Treat  MEWS Interventions Considered administering scheduled or prn medications/treatments as ordered  Take Vital Signs  Increase Vital Sign Frequency  Yellow: Q2hr x1, continue Q4hrs until patient remains green for 12hrs  Escalate  MEWS: Escalate Yellow: Discuss with charge nurse and consider notifying provider and/or RRT  Notify: Charge Nurse/RN  Name of Charge Nurse/RN Notified [place], RN  Provider Notification  Provider Name/Title Deatra Ina, PA  Date Provider Notified 01/15/23  Time Provider Notified 1150  Method of Notification Call  Notification Reason Change in status  Provider response See new orders  Date of Provider Response 01/15/23  Time of Provider Response 1150  Assess: SIRS CRITERIA  SIRS Temperature  0  SIRS Pulse 1  SIRS Respirations  0  SIRS WBC 0  SIRS Score Sum  1

## 2023-01-15 NOTE — Progress Notes (Signed)
Occupational Therapy Session Note  Patient Details  Name: Edward Waller MRN: [id number] Date of Birth: 1932-12-23  Today's Date: 01/15/2023 OT Individual Time: 6578-4696 OT Individual Time Calculation (min): 75 min    Short Term Goals: Week 1:  OT Short Term Goal 1 (Week 1): Pt will recall presure release schedule with min verbal cues OT Short Term Goal 2 (Week 1): Pt will tolerated sitting up in wc for 30 minutes or greater OT Short Term Goal 3 (Week 1): Pt will maintain sitting balance with min A consistently  Skilled Therapeutic Interventions/Progress Updates:    Pt resting in bed upon arrival. OT intervention with focus on bed mobility, LB dressing at bed level, SB transfers, sitting balance, and BUE therex to increase pt's independence with BADLs. Ted hose donned prior to engaging in bed mobility. Pt dependent for LB dressing tasks. Rolling R/L in bed with min A using bed rails. Pt able to pull up into upright sitting in bed with HOB elevated and use of bed rails. Min A for UB dressing tasks at bed level. Leg loops donned prior to sitting EOB. Supine>sit EOB with max A. SB tranfser to w/c with mod A. BUE therex with 4# bar. Circuit exercises 3x11. Pt returned to room and remained seated in w/c with all needs within reach. Seat belt secure.   Therapy Documentation Precautions:  Precautions Precautions: Back Precaution Booklet Issued: No Precaution Comments: Abdominal binder and thigh High ted hose for BP management for OOB. Required Braces or Orthoses: Spinal Brace Spinal Brace: Thoracolumbosacral orthotic, Applied in sitting position Restrictions Weight Bearing Restrictions: No   Pain: Pt denies pain this morning   Therapy/Group: Individual Therapy  [name] 01/15/2023, 11:25 AM

## 2023-01-16 DIAGNOSIS — G8221 Paraplegia, complete: Secondary | ICD-10-CM | POA: Diagnosis not present

## 2023-01-16 LAB — CBC
HCT: 37.3 % — ABNORMAL LOW (ref 39.0–52.0)
Hemoglobin: 12.7 g/dL — ABNORMAL LOW (ref 13.0–17.0)
MCH: 32.6 pg (ref 26.0–34.0)
MCHC: 34 g/dL (ref 30.0–36.0)
MCV: 95.9 fL (ref 80.0–100.0)
Platelets: 303 10*3/uL (ref 150–400)
RBC: 3.89 MIL/uL — ABNORMAL LOW (ref 4.22–5.81)
RDW: 13.2 % (ref 11.5–15.5)
WBC: 15.5 10*3/uL — ABNORMAL HIGH (ref 4.0–10.5)
nRBC: 0 % (ref 0.0–0.2)

## 2023-01-16 LAB — BASIC METABOLIC PANEL
Anion gap: 6 (ref 5–15)
BUN: 23 mg/dL (ref 8–23)
CO2: 22 mmol/L (ref 22–32)
Calcium: 8.2 mg/dL — ABNORMAL LOW (ref 8.9–10.3)
Chloride: 103 mmol/L (ref 98–111)
Creatinine, Ser: 0.96 mg/dL (ref 0.61–1.24)
GFR, Estimated: >60 mL/min (ref >60)
Glucose, Bld: 148 mg/dL — ABNORMAL HIGH (ref 70–99)
Potassium: 4.3 mmol/L (ref 3.5–5.1)
Sodium: 131 mmol/L — ABNORMAL LOW (ref 135–145)

## 2023-01-16 LAB — LACTIC ACID, PLASMA: Lactic Acid, Venous: 2.5 mmol/L (ref 0.5–1.9)

## 2023-01-16 MED ORDER — DOCUSATE SODIUM 283 MG RE ENEM
1.0000 | ENEMA | Freq: Every day | RECTAL | Status: DC
  Administered 2023-01-16 – 2023-01-24 (×8): 283 mg via RECTAL
  Filled 2023-01-16 (×9): qty 1

## 2023-01-16 MED ORDER — SODIUM CHLORIDE 0.9 % IV SOLN: INTRAVENOUS | Status: AC

## 2023-01-16 MED ORDER — PHENOL 1.4 % MT LIQD: 1.0000 | OROMUCOSAL | Status: DC | PRN

## 2023-01-16 NOTE — Progress Notes (Signed)
..  IP Rehab Bowel Program Documentation   Bowel Program Start time 1902  Dig Stim Indicated? Yes  Dig Stim Prior to Suppository or mini Enema X 1   Output from dig stim: Small  Ordered intervention: Suppository Yes , mini enema Yes ,   Repeat dig stim after Suppository or Mini enema  X 3,  Output? Small   Bowel Program Complete? Yes , handoff given Yes  Patient Tolerated? Yes

## 2023-01-16 NOTE — Progress Notes (Signed)
Physical Therapy Session Note  Patient Details  Name: Edward Waller MRN: [id number] Date of Birth: 07-Jun-1932  Today's Date: 01/16/2023 PT Individual Time: 0906-1000 and 1120-1200, and 1303-1420 PT Individual Time Calculation (min): 54 min and 40 min and 77 min  Short Term Goals: Week 1:  PT Short Term Goal 1 (Week 1): Pt will maintain sitting balance with CGA consistently PT Short Term Goal 2 (Week 1): Pt will be able to recall timing and initiate pressure relief independently, or request assist as needed PT Short Term Goal 3 (Week 1): Pt will initiate w/c propulsion/navigation  Skilled Therapeutic Interventions/Progress Updates: Pt presented in bed with nsg present agreeable to therapy. Pt denies pain at rest. Session focused on functional mobility and transfers. Pt noted to have TED hose already on but no shorts. PTA threaded pants total A and pt performed rolling to L with maxA. Pt noted to have torn brief as well as incontinent of bowel. PTA completed peri-care, donned brief, and pulled pants over hips total A while pt performed rolling L/R with maxA. Pt then completed supine to sit with maxA and required modA initially to maintain sitting balance EOB. Pt required total A for Slide board set up and completed Slide board transfer with maxA and +2 present for safety. Pt was able to assist scooting posteriorly to align in TIS. Pt then transported to ortho gym and participated in UBE L5 for 4 min pedaling forward for cardiovascular endurance. Pt transported back to room once completed and agreeable to remain in TIS. Pt left with call bell within reach and current needs met.   Tx2: Pt presented in TIS agreeable to therapy. Pt denies pain during session. Session focused on sitting balance on mat. Pt transported to day room and completed Slide board transfer with maxA to mat. At [facility] pt participated in various tasks working to attain midline and maintaining midline. Pt worked on placing hands on EOM  then working hands back but required cues to do slowly as quick movements would result in increased momentum and throw pt off balance. Pt then completed similar task but with hands closer to hips. Pt required max reinforcement to try and find balance before moving hands as pt would intermittent move hands forward/backwards quickly to find balance. PTA then switched to supporting behind and having pt lift head, find midline and then try to lift hands. Via this method pt was able to find balance and maintain unsupported seated for periods up to 20 secs. Pt encouraged to maintain head up and engage abdominals to shift weight anteriorly which pt was able to perform intermittently. Pt then performed lateral leans with use of triceps to push back to midline x 5 each side. On last lean PTA placed Slide board assisted pt to midline and and completed Slide board transfer with modA back into TIS. Pt was repositioned to comfort and transported back to room. Pt left in TIS at end of session with call bell within reach and needs met.   Tx3: Pt presented in TIS with wife present agreeable to therapy. Session focused on sitting balance, transfers, and introduction to ultralight w/c. Pt noted to have low grade fever per nsg, received Tylenol prior to session and cleared by nsg to treat. Pt transported to day room and completed Slide board transfer to high/low mat with modA (+2 present for safety). At [facility] pt worked on small range crunches for increased core ms recruitment as well as lateral scoots with PTA providing cues for  increased anterior lean with pt able to decrease weight through hips. PTA then introduced light weight w/c. Pt completed Slide board transfer to w/c with modA. Pt able to maintain fair sitting balance but noted intermittent R lateral lean which pt was able to correct with verbal cues. Pt instructed in use of brakes, and propulsion technique. Pt then able to propel around nsg station with supervision. Pt then  worked on Company secretary around cones initially requiring cues for safe navigation around cones but pt was able to progress with increased time. Pt then propelled partial distance back to room and completed slide board transfer to bed with maxA due to slight uphill slope. Completed sit to supine with modA with pt able to complete sidelying to elbows and PTA managing BLE. Pt repositioned to comfort and left with call bell within reach and wife present.         Therapy Documentation Precautions:  Precautions Precautions: Back Precaution Booklet Issued: No Precaution Comments: Abdominal binder and thigh High ted hose for BP management for OOB. Required Braces or Orthoses: Spinal Brace Spinal Brace: Thoracolumbosacral orthotic, Applied in sitting position Restrictions Weight Bearing Restrictions: No General:   Vital Signs: Therapy Vitals Temp: 98.6 F (37 C) Temp Source: Oral Pulse Rate: 81 Resp: 20 BP: (!) 106/54 (courtney RN notified) Patient Position (if appropriate): Sitting Oxygen Therapy SpO2: 94 % O2 Device: Room Air   Therapy/Group: Individual Therapy  Mylia Pondexter 01/16/2023, 12:57 PM
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
## 2023-01-16 NOTE — Progress Notes (Signed)
Occupational Therapy Session Note  Patient Details  Name: Edward Waller MRN: [id number] Date of Birth: 1932-12-19  Today's Date: 01/16/2023 OT Individual Time: 0700-0810 OT Individual Time Calculation (min): 70 min    Short Term Goals: Week 1:  OT Short Term Goal 1 (Week 1): Pt will recall presure release schedule with min verbal cues OT Short Term Goal 2 (Week 1): Pt will tolerated sitting up in wc for 30 minutes or greater OT Short Term Goal 3 (Week 1): Pt will maintain sitting balance with min A consistently  Skilled Therapeutic Interventions/Progress Updates:    Pt eating breakfast upon arrival. OT intervention with focus on bed mobility and UB bathing/dressing at bed level. Pt febrile with possible UTI and on IV ABX. Rolling R/L in bed with min A for BLE mgmt. Pt able to pull up to upright position using bed rails for assistance to facilitate washing back and donning pullover shirt. Pt donned pullover shirt with mod A at bed level. No reports of lightheaded ness. Multiple rest breaks during session. Pt remained in bed with all needs within reach.   Therapy Documentation Precautions:  Precautions Precautions: Back Precaution Booklet Issued: No Precaution Comments: Abdominal binder and thigh High ted hose for BP management for OOB. Required Braces or Orthoses: Spinal Brace Spinal Brace: Thoracolumbosacral orthotic, Applied in sitting position Restrictions Weight Bearing Restrictions: No Pain: Pt denies pain this morning but reports he feels "wiped out" since he is running temp   Therapy/Group: Individual Therapy  [name] 01/16/2023, 8:15 AM
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
## 2023-01-16 NOTE — Progress Notes (Signed)
Occupational Therapy Weekly Progress Note  Patient Details  Name: Edward Waller MRN: [id number] Date of Birth: Jun 29, 1932  Beginning of progress report period: January 11, 2023 End of progress report period: January 16, 2023  Patient has met 1 of 3 short term goals.  Pt is making slow but steady progress with BADLs, functional transfers, and sitting balance. Bathing with mod a at bed level. UB dressing with mod A at bed level. LB dressing with tot A. Rolling R/L using bed rails with min A. Supine>sit EOB with max A. Sitting balance with UE support and min A/mod A EOB or EOM. SB transfers with mod A and max verbal cues for technique. Pt fatigues quickly and requires multiple rest breaks during therapy. Family has not been present for therapy.   Patient continues to demonstrate the following deficits: muscle weakness, decreased cardiorespiratoy endurance, and decreased standing balance, decreased balance strategies, and difficulty maintaining precautions and therefore will continue to benefit from skilled OT intervention to enhance overall performance with BADL and Reduce care partner burden.  Patient progressing toward long term goals..  Continue plan of care.  OT Short Term Goals Week 1:  OT Short Term Goal 1 (Week 1): Pt will recall presure release schedule with min verbal cues OT Short Term Goal 1 - Progress (Week 1): Progressing toward goal OT Short Term Goal 2 (Week 1): Pt will tolerated sitting up in wc for 30 minutes or greater OT Short Term Goal 2 - Progress (Week 1): Met OT Short Term Goal 3 (Week 1): Pt will maintain sitting balance with min A consistently OT Short Term Goal 3 - Progress (Week 1): Progressing toward goal Week 2:  OT Short Term Goal 1 (Week 2): Pt will recall presure release schedule with min verbal cues OT Short Term Goal 2 (Week 2): Pt will maintain sitting balance with min A consistently OT Short Term Goal 3 (Week 2): Pt will don pullover shirt with min A in  unsupported sitting OT Short Term Goal 4 (Week 2): [name] transfers with mod A Week 3:     Skilled Therapeutic Interventions/Progress Updates:      Therapy Documentation Precautions:  Precautions Precautions: Back Precaution Booklet Issued: No Precaution Comments: Abdominal binder and thigh High ted hose for BP management for OOB. Required Braces or Orthoses: Spinal Brace Spinal Brace: Thoracolumbosacral orthotic, Applied in sitting position Restrictions Weight Bearing Restrictions: No General:   Vital Signs: Therapy Vitals Temp: 99.3 F (37.4 C) Temp Source: Oral Pulse Rate: 94 Resp: 20 BP: (!) 119/59 Patient Position (if appropriate): Lying Oxygen Therapy SpO2: 95 % O2 Device: Room Air Pain: Pain Assessment Pain Scale: 0-10 Pain Score: 0-No pain     Therapy/Group: Individual Therapy  [name] 01/16/2023, 2:58 PM

## 2023-01-17 DIAGNOSIS — G8221 Paraplegia, complete: Secondary | ICD-10-CM | POA: Diagnosis not present

## 2023-01-17 LAB — CBC WITH DIFFERENTIAL/PLATELET
Abs Immature Granulocytes: 0.07 10*3/uL (ref 0.00–0.07)
Basophils Absolute: 0 10*3/uL (ref 0.0–0.1)
Basophils Relative: 0 %
Eosinophils Absolute: 0 10*3/uL (ref 0.0–0.5)
Eosinophils Relative: 0 %
HCT: 36.6 % — ABNORMAL LOW (ref 39.0–52.0)
Hemoglobin: 12.3 g/dL — ABNORMAL LOW (ref 13.0–17.0)
Immature Granulocytes: 1 %
Lymphocytes Relative: 5 %
Lymphs Abs: 0.6 10*3/uL — ABNORMAL LOW (ref 0.7–4.0)
MCH: 31.7 pg (ref 26.0–34.0)
MCHC: 33.6 g/dL (ref 30.0–36.0)
MCV: 94.3 fL (ref 80.0–100.0)
Monocytes Absolute: 0.7 10*3/uL (ref 0.1–1.0)
Monocytes Relative: 6 %
Neutro Abs: 10.7 10*3/uL — ABNORMAL HIGH (ref 1.7–7.7)
Neutrophils Relative %: 88 %
Platelets: 269 10*3/uL (ref 150–400)
RBC: 3.88 MIL/uL — ABNORMAL LOW (ref 4.22–5.81)
RDW: 13.1 % (ref 11.5–15.5)
WBC: 12.1 10*3/uL — ABNORMAL HIGH (ref 4.0–10.5)
nRBC: 0 % (ref 0.0–0.2)

## 2023-01-17 LAB — BASIC METABOLIC PANEL
Anion gap: 6 (ref 5–15)
BUN: 14 mg/dL (ref 8–23)
CO2: 23 mmol/L (ref 22–32)
Calcium: 7.9 mg/dL — ABNORMAL LOW (ref 8.9–10.3)
Chloride: 99 mmol/L (ref 98–111)
Creatinine, Ser: 0.76 mg/dL (ref 0.61–1.24)
GFR, Estimated: >60 mL/min (ref >60)
Glucose, Bld: 140 mg/dL — ABNORMAL HIGH (ref 70–99)
Potassium: 4.1 mmol/L (ref 3.5–5.1)
Sodium: 128 mmol/L — ABNORMAL LOW (ref 135–145)

## 2023-01-17 LAB — LACTIC ACID, PLASMA: Lactic Acid, Venous: 0.9 mmol/L (ref 0.5–1.9)

## 2023-01-17 NOTE — Group Note (Signed)
Patient Details Name: Edward Waller MRN: [id number] DOB: 05/11/1932 Today's Date: 01/17/2023  Group Description: Stress management: Pt participated in group session with a focus on stress mgmt, education provided on healthy coping strategies, and social interaction. Focus of session on providing coping strategies to manage new diagnosis to allow for improved mental health to increase overall quality of life . Discussed how to break down stressors into "daily hassles," "major life stressors" and "life circumstances" in an effort to allow pts to chunk their stressors into groups and determine where to best put their efforts/time when dealing with stress. Provided active listening, emotional support and therapeutic use of self. Offered education on factors that protect [place] against stress such as "daily uplifts," "healthy coping strategies" and "protective factors." Encouraged all group members to make an effort to actively recall one event from their day that was a daily uplift in an effort to protect their mindset from stressors as well as sharing this information with their caregivers to facilitate improved caregiver communication and decrease overall burden of care.  Issued pt handouts on healthy coping strategies to implement into routine.   Individual level documentation: Patient participated with full collaboration during session.   Pain:no c/o Pain Assessment Pain Scale: 0-10 Pain Score: 0-No pain Multiple Pain Sites: No  Raliegh Scobie 01/17/2023, 12:22 PM

## 2023-01-17 NOTE — Group Note (Signed)
Patient Details Name: ZEDRICK SPRINGSTEEN MRN: [id number] DOB: 1932/10/22 Today's Date: 01/17/2023  Time Calculation: OT Group Time Calculation OT Group Start Time: 1105 OT Group Stop Time: 1205 OT Group Time Calculation (min): 60 min      Group Description: Stress management: Pt participated in group session with a focus on stress mgmt, education provided on healthy coping strategies, and social interaction. Focus of session on providing coping strategies to manage new diagnosis to allow for improved mental health to increase overall quality of life . Discussed how to break down stressors into "daily hassles," "major life stressors" and "life circumstances" in an effort to allow pts to chunk their stressors into groups and determine where to best put their efforts/time when dealing with stress. Provided active listening, emotional support and therapeutic use of self. Offered education on factors that protect [place] against stress such as "daily uplifts," "healthy coping strategies" and "protective factors." Encouraged all group members to make an effort to actively recall one event from their day that was a daily uplift in an effort to protect their mindset from stressors as well as sharing this information with their caregivers to facilitate improved caregiver communication and decrease overall burden of care.  Issued pt handouts on healthy coping strategies to implement into routine.   Individual level documentation: Patient participated with full collaboration during session.   Pain: No pain indicated during session   Barron Schmid 01/17/2023, 12:31 PM
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
## 2023-01-17 NOTE — Progress Notes (Signed)
Occupational Therapy Session Note  Patient Details  Name: Edward Waller MRN: [id number] Date of Birth: 1933/03/16  Today's Date: 01/17/2023 OT Individual Time: 6962-9528 OT Individual Time Calculation (min): 41 min    Short Term Goals: Week 2:  OT Short Term Goal 1 (Week 2): Pt will recall presure release schedule with min verbal cues OT Short Term Goal 2 (Week 2): Pt will maintain sitting balance with min A consistently OT Short Term Goal 3 (Week 2): Pt will don pullover shirt with min A in unsupported sitting OT Short Term Goal 4 (Week 2): [name] transfers with mod A  Skilled Therapeutic Interventions/Progress Updates:  Pt greeted reclined in TIS having just finished breakfast, pt agreeable to OT intervention.      Transfers/bed mobility: pt completed x2 SB transfers from w/c<> EOM with total A for board placement and MOD A +2 for safety. Emphasis on pt directing transfer method however pt needed MIN verbal cues for head/hips relationship.   Therapeutic activity: pt completed dynamic sitting balance task from EOM with pt instructed to reach out of BOS with BUEs to retrieve horseshoes and then toss to target with an emphasis on maintaining midline orientation.  Worked on modified crunches to facilitate improved core strength with pt instructed to shift trunk anteriorly and then slowly sit back onto therapy ball to work on improving core stability for higher level functional mobility tasks.    Ended session with pt seated in TIS with all needs within reach.   Therapy Documentation Precautions:  Precautions Precautions: Back Precaution Booklet Issued: No Precaution Comments: Abdominal binder and thigh High ted hose for BP management for OOB. Required Braces or Orthoses: Spinal Brace Spinal Brace: Thoracolumbosacral orthotic, Applied in sitting position Restrictions Weight Bearing Restrictions: No    Pain: No pain reported during session    Therapy/Group: Individual  Therapy  Barron Schmid 01/17/2023, 12:19 PM

## 2023-01-17 NOTE — Progress Notes (Signed)
Physical Therapy Session Note  Patient Details  Name: Edward Waller MRN: [id number] Date of Birth: 09-08-1932  Today's Date: 01/17/2023 PT Individual Time: 0830-0920 PT Individual Time Calculation (min): 50 min   Short Term Goals: Week 1:  PT Short Term Goal 1 (Week 1): Pt will maintain sitting balance with CGA consistently PT Short Term Goal 2 (Week 1): Pt will be able to recall timing and initiate pressure relief independently, or request assist as needed PT Short Term Goal 3 (Week 1): Pt will initiate w/c propulsion/navigation  Skilled Therapeutic Interventions/Progress Updates:    Pt presents in room in bed, agreeable to PT. Pt denies pain at this time. Pt reports that he was unable to find call light and unable to eat breakfast due to taking medication late. Session focused on therapeutic activities to facilitate participation with self care tasks as well as bed mobility and transfer training.  Pt requires total assist for donning thigh high TED hose and shorts, pt completes rolling with max assist for reaching for opposing bed rails as well as managing BLEs and hips to maintain spinal precautions. Therapist dons leg loops total assist. Pt completes supine to long sitting utilizing BUEs on hospital bed rails, able to achieve position with CGA/min assist and maintain for 15 seconds before fatiguing requring max assist to maintain upright for donning TLSO. Pt then prompted to complete BLEs off bed using leg loops while in long sitting position, pt requires mod max assist for managing BLEs off bed, +2 present to assist with postural stability. Pt requires max assist for balance seated EOB, verbal cues for BUE placement and propping. Therapist maintains max assist for stability and managing lifting LLE for 2nd person to place slideboard, pt requires max assist x2 for slideboard transfer with pt requiring max verbal cues for hand placement. Pt reports feeling lightheaded once seated in TIS WC,  therapist repositions pt in WC and tilts posteriorly.  Vitals assessed to be WNL BP 140/87. Pt requires increased time for all self care and mobility tasks due to some self limiting behaviors however pt able to complete with encouragement. Pt positioned with breakfast, RN notified pt IV leaking during session and present. Pt remains seated upright with all needs within reach, call light in place at end of session.  Therapy Documentation Precautions:  Precautions Precautions: Back Precaution Booklet Issued: No Precaution Comments: Abdominal binder and thigh High ted hose for BP management for OOB. Required Braces or Orthoses: Spinal Brace Spinal Brace: Thoracolumbosacral orthotic, Applied in sitting position Restrictions Weight Bearing Restrictions: No  Therapy/Group: Individual Therapy  Edwin Cap PT, DPT 01/17/2023, 12:35 PM

## 2023-01-17 NOTE — Progress Notes (Signed)
Physical Therapy Session Note  Patient Details  Name: Edward Waller MRN: [id number] Date of Birth: Sep 28, 1932  Today's Date: 01/17/2023 PT Individual Time: 4098-1191 PT Individual Time Calculation (min): 79 min   Short Term Goals: Week 1:  PT Short Term Goal 1 (Week 1): Pt will maintain sitting balance with CGA consistently PT Short Term Goal 2 (Week 1): Pt will be able to recall timing and initiate pressure relief independently, or request assist as needed PT Short Term Goal 3 (Week 1): Pt will initiate w/c propulsion/navigation  Skilled Therapeutic Interventions/Progress Updates: Pt presented in TIS with NT present attempting to lower shorts to perform in/out cath. PTA assisted in rolling pt while in TIS with max to total A for allow NT to lower shorts. Once cathing completed PTA completed same task to pull shorts back over hips. Pt then transported to main gym and completed Slide board transfer maxA to high/low mat and maxA (+2 present for safety). Pt noted to demonstrate limited initiation with this task and when questioned noted that he didn't realize he was supposed to push with BUE. While sitting EOM provided with yoga blocks and performed small range triceps extensions (pt unable to lift hips off mat). Pt encouraged to push and lean forward to mimic when performing Slide board transfer with improved technique. Pt then worked on core activation with pt performing small range modified crunches 2 x 5. Pt also worked in static sitting balance lifting head (avoiding thrusting) and maintaining static sitting balance with pt requiring min to modA as pt would begin quickly moving arms to attain balance. Pt then attempted to hold ball close to chest while holding static balance however pt with poor/absent righting reactions and was unable to sustain static balance. After extended seated rest and PTA providing encouragement as pt appeared frustrated at himself. PTA obtained pt's lightweight w/c and  completed Slide board transfer to w/c with maxA however pt providing increased assist with use of BUE. Pt was able to unlock brakes and propelled to room with supervision. In room pt set up and completed Slide board transfer with maxA (+2 present for safety). Completed sit to supine with maxA and was positioned to comfort. Pt left in bed at end of session with call bell within reach and needs net.      Therapy Documentation Precautions:  Precautions Precautions: Back Precaution Booklet Issued: No Precaution Comments: Abdominal binder and thigh High ted hose for BP management for OOB. Required Braces or Orthoses: Spinal Brace Spinal Brace: Thoracolumbosacral orthotic, Applied in sitting position Restrictions Weight Bearing Restrictions:  (paralysis of B/L lower extremites) General:   Vital Signs: Therapy Vitals Temp: 98.8 F (37.1 C) Temp Source: Oral Pulse Rate: 87 Resp: 16 BP: 130/81 Patient Position (if appropriate): Lying Oxygen Therapy SpO2: 90 % O2 Device: Room Air Other Treatments:      Therapy/Group: Individual Therapy  Ellionna Buckbee 01/17/2023, 4:15 PM

## 2023-01-17 NOTE — Plan of Care (Signed)
Patient alert/oriented X4. Patient compliant with medication administration and was up in wheelchair for a few hours. Patient had a bowel movement and had 750 mL output from in/out catheter. Patient IV dressing reinforced and tolerated IV abx. VSS, no complaints at this time.   Problem: Consults Goal: RH SPINAL CORD INJURY PATIENT EDUCATION Description:  See Patient Education module for education specifics.  Outcome: Progressing   Problem: SCI BOWEL ELIMINATION Goal: RH STG SCI MANAGE BOWEL PROGRAM W/ASSIST OR AS APPROPRIATE Description: STG SCI Manage bowel program w/mod assist or as appropriate. Outcome: Progressing   Problem: SCI BLADDER ELIMINATION Goal: RH STG MANAGE BLADDER WITH MEDICATION WITH ASSISTANCE Description: STG Manage Bladder With Medication With min Assistance. Outcome: Progressing   Problem: SCI BLADDER ELIMINATION Goal: RH STG SCI MANAGE BLADDER PROGRAM W/ASSISTANCE Description: Patient/caregiver will be able to manage bladder program or foley care at home min assist  Outcome: Progressing   Problem: RH SKIN INTEGRITY Goal: RH STG SKIN FREE OF INFECTION/BREAKDOWN Description: Skin will remain intact and free of breakdown with min assist  Outcome: Progressing   Problem: RH SKIN INTEGRITY Goal: RH STG MAINTAIN SKIN INTEGRITY WITH ASSISTANCE Description: STG Maintain Skin Integrity With min Assistance. Outcome: Progressing   Problem: RH SAFETY Goal: RH STG ADHERE TO SAFETY PRECAUTIONS W/ASSISTANCE/DEVICE Description: STG Adhere to Safety Precautions With min Assistance/Device. Outcome: Progressing   Problem: RH SAFETY Goal: RH STG DECREASED RISK OF FALL WITH ASSISTANCE Description: STG Decreased Risk of Fall With min Assistance. Outcome: Progressing   Problem: RH PAIN MANAGEMENT Goal: RH STG PAIN MANAGED AT OR BELOW PT'S PAIN GOAL Description: Pain will be managed less than 4 with PRN medications min assist  Outcome: Progressing   Problem: RH KNOWLEDGE  DEFICIT SCI Goal: RH STG INCREASE KNOWLEDGE OF SELF CARE AFTER SCI Description: Patient/caregiver will be able to manage medications and bowel/bladder program independently from nursing education, nursing handouts, and other resources independently  Outcome: Progressing   Problem: Education: Goal: Knowledge of the prescribed therapeutic regimen will improve Outcome: Progressing   Problem: Education: Goal: Understanding of discharge needs will improve Outcome: Progressing   Problem: Education: Goal: Individualized Educational Video(s) Outcome: Progressing   Problem: Activity: Goal: Ability to avoid complications of mobility impairment will improve Outcome: Progressing   Problem: Activity: Goal: Ability to tolerate increased activity will improve Outcome: Progressing   Problem: Clinical Measurements: Goal: Postoperative complications will be avoided or minimized Outcome: Progressing   Problem: Pain Management: Goal: Pain level will decrease with appropriate interventions Outcome: Progressing   Problem: Skin Integrity: Goal: Will show signs of wound healing Outcome: Progressing

## 2023-01-18 DIAGNOSIS — N319 Neuromuscular dysfunction of bladder, unspecified: Secondary | ICD-10-CM | POA: Diagnosis not present

## 2023-01-18 DIAGNOSIS — S24103A Unspecified injury at T7-T10 level of thoracic spinal cord, initial encounter: Secondary | ICD-10-CM | POA: Diagnosis not present

## 2023-01-18 DIAGNOSIS — G8221 Paraplegia, complete: Secondary | ICD-10-CM | POA: Diagnosis not present

## 2023-01-18 DIAGNOSIS — K592 Neurogenic bowel, not elsewhere classified: Secondary | ICD-10-CM | POA: Diagnosis not present

## 2023-01-18 LAB — BASIC METABOLIC PANEL
Anion gap: 14 (ref 5–15)
BUN: 14 mg/dL (ref 8–23)
CO2: 22 mmol/L (ref 22–32)
Calcium: 8.7 mg/dL — ABNORMAL LOW (ref 8.9–10.3)
Chloride: 97 mmol/L — ABNORMAL LOW (ref 98–111)
Creatinine, Ser: 0.81 mg/dL (ref 0.61–1.24)
GFR, Estimated: >60 mL/min (ref >60)
Glucose, Bld: 139 mg/dL — ABNORMAL HIGH (ref 70–99)
Potassium: 4.7 mmol/L (ref 3.5–5.1)
Sodium: 133 mmol/L — ABNORMAL LOW (ref 135–145)

## 2023-01-18 LAB — URINE CULTURE: Culture: >=100000 — AB

## 2023-01-18 MED ORDER — BACLOFEN 5 MG HALF TABLET
5.0000 mg | ORAL_TABLET | Freq: Three times a day (TID) | ORAL | Status: DC
  Administered 2023-01-18 – 2023-01-23 (×17): 5 mg via ORAL
  Filled 2023-01-18 (×18): qty 1
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
## 2023-01-18 NOTE — Progress Notes (Signed)
..  IP Rehab Bowel Program Documentation   Bowel Program Start time 2230  Dig Stim Indicated? Yes  Dig Stim Prior to Suppository or mini Enema X 0   Output from dig stim: Minimal  Ordered intervention: Suppository Yes , mini enema Yes ,   Repeat dig stim after Suppository or Mini enema  X 2,  Output? Large   Bowel Program Complete? Yes , handoff given Not needed   Patient Tolerated? Yes     From chart reports patient refused bowel medication at 1737. Dig stim performed at 2230 with large amount of stool produced.

## 2023-01-18 NOTE — Plan of Care (Signed)
  Problem: Consults Goal: RH SPINAL CORD INJURY PATIENT EDUCATION Description:  See Patient Education module for education specifics.  Outcome: Progressing   Problem: SCI BOWEL ELIMINATION Goal: RH STG SCI MANAGE BOWEL PROGRAM W/ASSIST OR AS APPROPRIATE Description: STG SCI Manage bowel program w/mod assist or as appropriate. Outcome: Progressing   Problem: SCI BLADDER ELIMINATION Goal: RH STG MANAGE BLADDER WITH MEDICATION WITH ASSISTANCE Description: STG Manage Bladder With Medication With min Assistance. Outcome: Progressing Goal: RH STG SCI MANAGE BLADDER PROGRAM W/ASSISTANCE Description: Patient/caregiver will be able to manage bladder program or foley care at home min assist  Outcome: Progressing   Problem: RH SKIN INTEGRITY Goal: RH STG SKIN FREE OF INFECTION/BREAKDOWN Description: Skin will remain intact and free of breakdown with min assist  Outcome: Progressing Goal: RH STG MAINTAIN SKIN INTEGRITY WITH ASSISTANCE Description: STG Maintain Skin Integrity With min Assistance. Outcome: Progressing   Problem: RH SAFETY Goal: RH STG ADHERE TO SAFETY PRECAUTIONS W/ASSISTANCE/DEVICE Description: STG Adhere to Safety Precautions With min Assistance/Device. Outcome: Progressing Goal: RH STG DECREASED RISK OF FALL WITH ASSISTANCE Description: STG Decreased Risk of Fall With min Assistance. Outcome: Progressing   Problem: RH PAIN MANAGEMENT Goal: RH STG PAIN MANAGED AT OR BELOW PT'S PAIN GOAL Description: Pain will be managed less than 4 with PRN medications min assist  Outcome: Progressing   Problem: RH KNOWLEDGE DEFICIT SCI Goal: RH STG INCREASE KNOWLEDGE OF SELF CARE AFTER SCI Description: Patient/caregiver will be able to manage medications and bowel/bladder program independently from nursing education, nursing handouts, and other resources independently  Outcome: Progressing   Problem: Education: Goal: Knowledge of the prescribed therapeutic regimen will  improve Outcome: Progressing Goal: Understanding of discharge needs will improve Outcome: Progressing Goal: Individualized Educational Video(s) Outcome: Progressing   Problem: Activity: Goal: Ability to avoid complications of mobility impairment will improve Outcome: Progressing Goal: Ability to tolerate increased activity will improve Outcome: Progressing   Problem: Clinical Measurements: Goal: Postoperative complications will be avoided or minimized Outcome: Progressing   Problem: Pain Management: Goal: Pain level will decrease with appropriate interventions Outcome: Progressing   Problem: Skin Integrity: Goal: Will show signs of wound healing Outcome: Progressing   Problem: RH KNOWLEDGE DEFICIT SCI Goal: RH STG INCREASE KNOWLEDGE OF SELF CARE AFTER SCI Description: Patient/caregiver will be able to manage medications and bowel/bladder program independently from nursing education, nursing handouts, and other resources independently  Outcome: Progressing   Problem: Pain Management: Goal: Pain level will decrease with appropriate interventions Outcome: Progressing

## 2023-01-18 NOTE — Progress Notes (Signed)
IV site discontinued to right lower forearm. Concern for phlebitis. Erythema around puncture site of IV catheter going upwards on patient's arm. Denies any pain nor is area tender for patient. Pharmacy contacted. New IV site created on left lower radial area of patient's arm.

## 2023-01-18 NOTE — Progress Notes (Signed)
IP Rehab Bowel Program Documentation   Bowel Program Start time [phone]  Dig Stim Indicated? Yes  Dig Stim Prior to Suppository: Yes 5-10 times   Enema: Yes  Output from dig stim: Large  Ordered intervention: Suppository Yes , mini enema Yes ,   Repeat dig stim after Suppository or Mini enema: 5-10 times ,  Output? Moderate   Bowel Program Complete? Yes , handoff given Darryll Capers, LPN Notified.  Patient Tolerated? Yes     Tilden Dome, LPN

## 2023-01-19 DIAGNOSIS — R252 Cramp and spasm: Secondary | ICD-10-CM | POA: Diagnosis not present

## 2023-01-19 DIAGNOSIS — G8221 Paraplegia, complete: Secondary | ICD-10-CM | POA: Diagnosis not present

## 2023-01-19 DIAGNOSIS — T83511S Infection and inflammatory reaction due to indwelling urethral catheter, sequela: Secondary | ICD-10-CM | POA: Diagnosis not present

## 2023-01-19 DIAGNOSIS — N319 Neuromuscular dysfunction of bladder, unspecified: Secondary | ICD-10-CM | POA: Diagnosis not present

## 2023-01-19 DIAGNOSIS — N390 Urinary tract infection, site not specified: Secondary | ICD-10-CM

## 2023-01-19 NOTE — Progress Notes (Addendum)
   IP Rehab Bowel Program Documentation    Bowel Program Start time 1815   Dig Stim Indicated? Yes  Dig Stim Prior to Suppository: Yes 5-10 times   Enema: Yes   Output from dig stim: Large   Ordered intervention: Suppository Yes , mini enema Yes ,    Repeat dig stim after Suppository or Mini enema: 5-10 times ,   Output? Moderate    Bowel Program Complete? NO ,   handoff given : Notified Carney Bern, RN        Tilden Dome, LPN

## 2023-01-19 NOTE — Progress Notes (Signed)
Noted small blood clot to urine and some blood noted to catheter tip. Urine yellow and clear. Notified on call MD- Dr. Wynn Banker. Per MD to use coude cath.  Tilden Dome, LPN
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
## 2023-01-19 NOTE — Progress Notes (Signed)
Per wife she states she knows how to perform I/O Cath and Bowel program. Wife is a retired Engineer, civil (consulting). Per Wife stated she will need help with him at home due to her age and physical limitations.   Tilden Dome, LPN

## 2023-01-19 NOTE — Progress Notes (Signed)
SCI plan in place and implemented. Patient turned and reposition per Md order.    Tilden Dome, LPN

## 2023-01-19 NOTE — Plan of Care (Signed)
  Problem: Consults Goal: RH SPINAL CORD INJURY PATIENT EDUCATION Description:  See Patient Education module for education specifics.  Outcome: Progressing   Problem: SCI BOWEL ELIMINATION Goal: RH STG SCI MANAGE BOWEL PROGRAM W/ASSIST OR AS APPROPRIATE Description: STG SCI Manage bowel program w/mod assist or as appropriate. Outcome: Progressing   Problem: SCI BLADDER ELIMINATION Goal: RH STG MANAGE BLADDER WITH MEDICATION WITH ASSISTANCE Description: STG Manage Bladder With Medication With min Assistance. Outcome: Progressing Goal: RH STG SCI MANAGE BLADDER PROGRAM W/ASSISTANCE Description: Patient/caregiver will be able to manage bladder program or foley care at home min assist  Outcome: Progressing   Problem: RH SKIN INTEGRITY Goal: RH STG SKIN FREE OF INFECTION/BREAKDOWN Description: Skin will remain intact and free of breakdown with min assist  Outcome: Progressing Goal: RH STG MAINTAIN SKIN INTEGRITY WITH ASSISTANCE Description: STG Maintain Skin Integrity With min Assistance. Outcome: Progressing   Problem: RH SAFETY Goal: RH STG ADHERE TO SAFETY PRECAUTIONS W/ASSISTANCE/DEVICE Description: STG Adhere to Safety Precautions With min Assistance/Device. Outcome: Progressing Goal: RH STG DECREASED RISK OF FALL WITH ASSISTANCE Description: STG Decreased Risk of Fall With min Assistance. Outcome: Progressing   Problem: RH PAIN MANAGEMENT Goal: RH STG PAIN MANAGED AT OR BELOW PT'S PAIN GOAL Description: Pain will be managed less than 4 with PRN medications min assist  Outcome: Progressing   Problem: RH KNOWLEDGE DEFICIT SCI Goal: RH STG INCREASE KNOWLEDGE OF SELF CARE AFTER SCI Description: Patient/caregiver will be able to manage medications and bowel/bladder program independently from nursing education, nursing handouts, and other resources independently  Outcome: Progressing   Problem: Education: Goal: Knowledge of the prescribed therapeutic regimen will  improve Outcome: Progressing Goal: Understanding of discharge needs will improve Outcome: Progressing Goal: Individualized Educational Video(s) Outcome: Progressing   Problem: Activity: Goal: Ability to avoid complications of mobility impairment will improve Outcome: Progressing Goal: Ability to tolerate increased activity will improve Outcome: Progressing   Problem: Clinical Measurements: Goal: Postoperative complications will be avoided or minimized Outcome: Progressing   Problem: Pain Management: Goal: Pain level will decrease with appropriate interventions Outcome: Progressing   Problem: Skin Integrity: Goal: Will show signs of wound healing Outcome: Progressing   Problem: Education: Goal: Understanding of discharge needs will improve Outcome: Progressing   Problem: Education: Goal: Individualized Educational Video(s) Outcome: Progressing   Problem: Activity: Goal: Ability to avoid complications of mobility impairment will improve Outcome: Progressing   Problem: Activity: Goal: Ability to tolerate increased activity will improve Outcome: Progressing

## 2023-01-20 DIAGNOSIS — G8221 Paraplegia, complete: Secondary | ICD-10-CM | POA: Diagnosis not present

## 2023-01-20 LAB — COMPREHENSIVE METABOLIC PANEL
ALT: 248 U/L — ABNORMAL HIGH (ref 0–44)
AST: 199 U/L — ABNORMAL HIGH (ref 15–41)
Albumin: 2.2 g/dL — ABNORMAL LOW (ref 3.5–5.0)
Alkaline Phosphatase: 131 U/L — ABNORMAL HIGH (ref 38–126)
Anion gap: 14 (ref 5–15)
BUN: 17 mg/dL (ref 8–23)
CO2: 24 mmol/L (ref 22–32)
Calcium: 8.8 mg/dL — ABNORMAL LOW (ref 8.9–10.3)
Chloride: 95 mmol/L — ABNORMAL LOW (ref 98–111)
Creatinine, Ser: 0.9 mg/dL (ref 0.61–1.24)
GFR, Estimated: >60 mL/min (ref >60)
Glucose, Bld: 165 mg/dL — ABNORMAL HIGH (ref 70–99)
Potassium: 4.3 mmol/L (ref 3.5–5.1)
Sodium: 133 mmol/L — ABNORMAL LOW (ref 135–145)
Total Bilirubin: 0.6 mg/dL (ref 0.3–1.2)
Total Protein: 6.6 g/dL (ref 6.5–8.1)

## 2023-01-20 LAB — CBC WITH DIFFERENTIAL/PLATELET
Abs Immature Granulocytes: 0.07 10*3/uL (ref 0.00–0.07)
Basophils Absolute: 0 10*3/uL (ref 0.0–0.1)
Basophils Relative: 0 %
Eosinophils Absolute: 0.1 10*3/uL (ref 0.0–0.5)
Eosinophils Relative: 1 %
HCT: 38.6 % — ABNORMAL LOW (ref 39.0–52.0)
Hemoglobin: 13.1 g/dL (ref 13.0–17.0)
Immature Granulocytes: 1 %
Lymphocytes Relative: 8 %
Lymphs Abs: 0.9 10*3/uL (ref 0.7–4.0)
MCH: 31.6 pg (ref 26.0–34.0)
MCHC: 33.9 g/dL (ref 30.0–36.0)
MCV: 93.2 fL (ref 80.0–100.0)
Monocytes Absolute: 0.7 10*3/uL (ref 0.1–1.0)
Monocytes Relative: 6 %
Neutro Abs: 9.9 10*3/uL — ABNORMAL HIGH (ref 1.7–7.7)
Neutrophils Relative %: 84 %
Platelets: 351 10*3/uL (ref 150–400)
RBC: 4.14 MIL/uL — ABNORMAL LOW (ref 4.22–5.81)
RDW: 13.1 % (ref 11.5–15.5)
WBC: 11.8 10*3/uL — ABNORMAL HIGH (ref 4.0–10.5)
nRBC: 0 % (ref 0.0–0.2)

## 2023-01-20 MED ORDER — SODIUM CHLORIDE 0.9 % IV SOLN
1.0000 g | INTRAVENOUS | Status: AC
  Administered 2023-01-20 – 2023-01-24 (×5): 1 g via INTRAVENOUS
  Filled 2023-01-20 (×5): qty 10

## 2023-01-20 NOTE — Progress Notes (Signed)
Occupational Therapy Session Note  Patient Details  Name: Edward Waller MRN: [id number] Date of Birth: [birth year]  Today's Date: 01/20/2023 OT Individual Time: 0700-0810 OT Individual Time Calculation (min): 70 min    Short Term Goals: Week 2:  OT Short Term Goal 1 (Week 2): Pt will recall presure release schedule with min verbal cues OT Short Term Goal 2 (Week 2): Pt will maintain sitting balance with min A consistently OT Short Term Goal 3 (Week 2): Pt will don pullover shirt with min A in unsupported sitting OT Short Term Goal 4 (Week 2): [name] transfers with mod A  Skilled Therapeutic Interventions/Progress Updates:    Pt in bed eating breakfast upon arrival. OT intervention with focus on bathing/dressing at bed level, bed mobility, and activity tolerance to increase indepenence with BADLs. Rolling R/L in bed with min A using bed rails. Bathing with mod a at bed level. UB dressing with mod A. Pt able to thread BLE into sleeves but requires assistance pulling over head and trunk. Long sitting in bed with pt using bed rails to maintain balance to facilitate OTA assisting with UB dressing. Pt reamined in bed with all needs within reach.  Therapy Documentation Precautions:  Precautions Precautions: Back Precaution Booklet Issued: No Precaution Comments: Abdominal binder and thigh High ted hose for BP management for OOB. Required Braces or Orthoses: Spinal Brace Spinal Brace: Thoracolumbosacral orthotic, Applied in sitting position Restrictions Weight Bearing Restrictions: No   Pain:  Pt denies pain this morning   Therapy/Group: Individual Therapy  [name] 01/20/2023, 8:11 AM
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
## 2023-01-20 NOTE — Progress Notes (Signed)
IP Rehab Bowel Program Documentation    Bowel Program Start time 1815   Dig Stim Indicated? Yes  Dig Stim Prior to Suppository: Yes 5-10 times   Enema: Yes   Output from dig stim: Large   Ordered intervention: Suppository Yes , mini enema Yes ,    Repeat dig stim after Suppository or Mini enema: 5-10 times ,   Output? Moderate    Bowel Program Complete? NO ,     handoff given : Notified Emergency planning/management officer

## 2023-01-20 NOTE — Progress Notes (Signed)
   01/20/23 1548  Spiritual Encounters  Type of Visit Follow up  Care provided to: Pt and family  Referral source Patient request  Reason for visit Advance directives  OnCall Visit No   Chaplain returned to room when Pt had RN call and say their ACD paperwork was ready to be notarized.  Chaplain inspected paperwork, arranged for notary and witnesses to come to room, and then filed the appropriate copies in their designated places.   Chaplain return the original and a copy to the Pt and his HCPOA agent.  Chaplain services remain available by Spiritual Consult or for emergent cases, paging [phone]  Chaplain Raelene Bott, MDiv [email or web address] [phone]

## 2023-01-20 NOTE — Progress Notes (Addendum)
Wife stated she was upset because she thought this nurse stated she unhooked the patient's IV. This nurse did not state that. Prior to wife making statement to nurse, IV pump beeped a couple times. This nurse apologized for any confusion caused. Wife apologized upon arrival to room. Patient today has been indifferent to  this nurse. This nurse has  performed head to toe assessment, help provided repositioning, transferring patient up and down throughout the day, perform ADL's, followed current care plan, ask patient and wife about needs/concerns anticipated needs throughout the day. This nurse has been very pleasant. This nurse has responded in a timely manner today and has responded to all needs when assigned to this patient. Notified Consulting civil engineer of patient and wife's behavior today. Chaplain made visit today as requested from patient and wife. Addressed therapy schedule with management as well.    Tilden Dome, LPN

## 2023-01-20 NOTE — Progress Notes (Signed)
IP Rehab Bowel Program Documentation   Bowel Program Start time [phone]  Dig Stim Indicated? {YES/NO:21197} Dig Stim Prior to Suppository or mini Enema {Numbers; 1-5:17750}   Output from dig stim: {Desc; minimal/small/moderate/large/very large:110034}  Ordered intervention: Suppository {YES/NO:21197}, mini enema {YES/NO:21197},   Repeat dig stim after Suppository or Mini enema  X {Numbers; 1-5:17750},  Output? {Desc; minimal/small/moderate/large/very large:110034}   Bowel Program Complete? {YES/NO:21197}, handoff given ***  Patient Tolerated? {YES/NO:21197}

## 2023-01-20 NOTE — Progress Notes (Signed)
Physical Therapy Weekly Progress Note  Patient Details  Name: Edward Waller MRN: [id number] Date of Birth: 1932-08-02  Beginning of progress report period: January 11, 2023 End of progress report period: January 20, 2023  Today's Date: 01/20/2023 PT Individual Time: 0920-1000.1300-1415 PT Individual Time Calculation (min): 40 min, 75 min   Patient has met 2 of 3 short term goals.  Pt is progressing well with sitting balance and improving initiation during transfers. Continues to require assist for bed mobility and slideboard transfer. Pt has initiated manual wheelchair propulsion, but cannot perform adequate pressure relief to remain in chair for extended periods at this time.   Patient continues to demonstrate the following deficits muscle weakness, abnormal tone, and decreased sitting balance, decreased postural control, decreased balance strategies, and difficulty maintaining precautions and therefore will continue to benefit from skilled PT intervention to increase functional independence with mobility.  Patient progressing toward long term goals..  Continue plan of care.  PT Short Term Goals Week 1:  PT Short Term Goal 1 (Week 1): Pt will maintain sitting balance with CGA consistently PT Short Term Goal 1 - Progress (Week 1): Met PT Short Term Goal 2 (Week 1): Pt will be able to recall timing and initiate pressure relief independently, or request assist as needed PT Short Term Goal 2 - Progress (Week 1): Progressing toward goal PT Short Term Goal 3 (Week 1): Pt will initiate w/c propulsion/navigation PT Short Term Goal 3 - Progress (Week 1): Met Week 2:  PT Short Term Goal 1 (Week 2): Pt will perform SBT with mod A or better PT Short Term Goal 2 (Week 2): Pt will sit EOB with supervision or better with and without UE support PT Short Term Goal 3 (Week 2): Pt will require min a for supine<>sit  Skilled Therapeutic Interventions/Progress Updates:   Session 1: Pt missed x 10  minutes at start of session for I&O cathing. pt received in bed and agreeable to therapy. No complaint of pain. Donned leg loops with pt assisting stabilizing LE in hooklying. Pt was able to use leg loops to assist with bed mobility but still requires mod A overall. Extended time for transfer to allow pt to initiate more of transfer, requiring mod-max A with +2 for safety. Pt then participated in pressure relief training using lateral lean technique. Pt required assist to use leg loops to cross leg, but was able to hold fully offloaded position x 2 min each side. Pt will benefit from continued education and practice. Pt remained in TIS chair and was left with all needs in reach.   Session 2: pt received in bed and agreeable to therapy. No complaint of pain. Pt able to mobilize BLE to EOB with min A using leg loops and then required only min A for trunk elevation and CGA sitting EOB while therapist donned shoes.   Performed max A slideboard transfer to manual w/c with +2 assist to stabilize. Pt then propelled w/c to/from main gym with supervision. Pt does much better with gloves for increased grip. Pt was able to nearly clear sacrum but not IT's performing w/c pushup on wheels up to 10 sec. Pt returned to room and to bed in same manner, min A to lift BLE onto bed with leg loops, then max for safety with trunk.   Pt participated in rolling practice, able to perform all steps with min a to hook fingers in leg loop and then CGA and max cueing for rest of steps. Therapist  provided LE stretching during rest breaks, with noted increase in hamstring spasms this pm. Pt remained in bed at end of session and was left with all needs in reach and alarm active.   Therapy Documentation Precautions:  Precautions Precautions: Back Precaution Booklet Issued: No Precaution Comments: Abdominal binder and thigh High ted hose for BP management for OOB. Required Braces or Orthoses: Spinal Brace Spinal Brace:  Thoracolumbosacral orthotic, Applied in sitting position Restrictions Weight Bearing Restrictions: No General: PT Amount of Missed Time (min): 10 Minutes PT Missed Treatment Reason: Nursing care (IO cath)    Therapy/Group: Individual Therapy  [name] 01/20/2023, 12:40 PM

## 2023-01-20 NOTE — Plan of Care (Signed)
  Problem: Consults Goal: RH SPINAL CORD INJURY PATIENT EDUCATION Description:  See Patient Education module for education specifics.  Outcome: Progressing   Problem: SCI BOWEL ELIMINATION Goal: RH STG SCI MANAGE BOWEL PROGRAM W/ASSIST OR AS APPROPRIATE Description: STG SCI Manage bowel program w/mod assist or as appropriate. Outcome: Progressing   Problem: SCI BLADDER ELIMINATION Goal: RH STG MANAGE BLADDER WITH MEDICATION WITH ASSISTANCE Description: STG Manage Bladder With Medication With min Assistance. Outcome: Progressing Goal: RH STG SCI MANAGE BLADDER PROGRAM W/ASSISTANCE Description: Patient/caregiver will be able to manage bladder program or foley care at home min assist  Outcome: Progressing   Problem: RH SKIN INTEGRITY Goal: RH STG SKIN FREE OF INFECTION/BREAKDOWN Description: Skin will remain intact and free of breakdown with min assist  Outcome: Progressing Goal: RH STG MAINTAIN SKIN INTEGRITY WITH ASSISTANCE Description: STG Maintain Skin Integrity With min Assistance. Outcome: Progressing   Problem: RH SAFETY Goal: RH STG ADHERE TO SAFETY PRECAUTIONS W/ASSISTANCE/DEVICE Description: STG Adhere to Safety Precautions With min Assistance/Device. Outcome: Progressing Goal: RH STG DECREASED RISK OF FALL WITH ASSISTANCE Description: STG Decreased Risk of Fall With min Assistance. Outcome: Progressing   Problem: RH PAIN MANAGEMENT Goal: RH STG PAIN MANAGED AT OR BELOW PT'S PAIN GOAL Description: Pain will be managed less than 4 with PRN medications min assist  Outcome: Progressing   Problem: RH KNOWLEDGE DEFICIT SCI Goal: RH STG INCREASE KNOWLEDGE OF SELF CARE AFTER SCI Description: Patient/caregiver will be able to manage medications and bowel/bladder program independently from nursing education, nursing handouts, and other resources independently  Outcome: Progressing   Problem: Education: Goal: Knowledge of the prescribed therapeutic regimen will  improve Outcome: Progressing Goal: Understanding of discharge needs will improve Outcome: Progressing Goal: Individualized Educational Video(s) Outcome: Progressing   Problem: Activity: Goal: Ability to avoid complications of mobility impairment will improve Outcome: Progressing Goal: Ability to tolerate increased activity will improve Outcome: Progressing   Problem: Clinical Measurements: Goal: Postoperative complications will be avoided or minimized Outcome: Progressing   Problem: Pain Management: Goal: Pain level will decrease with appropriate interventions Outcome: Progressing   Problem: Skin Integrity: Goal: Will show signs of wound healing Outcome: Progressing   Problem: SCI BOWEL ELIMINATION Goal: RH STG SCI MANAGE BOWEL PROGRAM W/ASSIST OR AS APPROPRIATE Description: STG SCI Manage bowel program w/mod assist or as appropriate. Outcome: Progressing   Problem: Consults Goal: RH SPINAL CORD INJURY PATIENT EDUCATION Description:  See Patient Education module for education specifics.  Outcome: Progressing   Problem: SCI BLADDER ELIMINATION Goal: RH STG SCI MANAGE BLADDER PROGRAM W/ASSISTANCE Description: Patient/caregiver will be able to manage bladder program or foley care at home min assist  Outcome: Progressing   Problem: RH SKIN INTEGRITY Goal: RH STG SKIN FREE OF INFECTION/BREAKDOWN Description: Skin will remain intact and free of breakdown with min assist  Outcome: Progressing   Problem: RH SKIN INTEGRITY Goal: RH STG MAINTAIN SKIN INTEGRITY WITH ASSISTANCE Description: STG Maintain Skin Integrity With min Assistance. Outcome: Progressing   Problem: Activity: Goal: Ability to tolerate increased activity will improve Outcome: Progressing   Problem: Clinical Measurements: Goal: Postoperative complications will be avoided or minimized Outcome: Progressing   Problem: Pain Management: Goal: Pain level will decrease with appropriate  interventions Outcome: Progressing

## 2023-01-21 DIAGNOSIS — G8221 Paraplegia, complete: Secondary | ICD-10-CM | POA: Diagnosis not present

## 2023-01-21 MED ORDER — CITALOPRAM HYDROBROMIDE 20 MG PO TABS
20.0000 mg | ORAL_TABLET | Freq: Every day | ORAL | Status: DC
  Administered 2023-01-21 – 2023-01-28 (×8): 20 mg via ORAL
  Filled 2023-01-21 (×8): qty 1
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
## 2023-01-21 NOTE — Progress Notes (Addendum)
Self cath education completed verbally and by demonstrationpt states wife will be doing caths. Wll educate wfe at nxt cath
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
## 2023-01-21 NOTE — Progress Notes (Addendum)
Occupational Therapy Session Note  Patient Details  Name: Edward Waller MRN: [id number] Date of Birth: 05/02/1932  Today's Date: 01/21/2023 OT Individual Time: 1101-1208 OT Individual Time Calculation (min): 67 min    Short Term Goals: Week 2:  OT Short Term Goal 1 (Week 2): Pt will recall presure release schedule with min verbal cues OT Short Term Goal 2 (Week 2): Pt will maintain sitting balance with min A consistently OT Short Term Goal 3 (Week 2): Pt will don pullover shirt with min A in unsupported sitting OT Short Term Goal 4 (Week 2): [name] transfers with mod A  Skilled Therapeutic Interventions/Progress Updates:  Pt greeted supine in bed, pt agreeable to OT intervention.    Pt initially reports mild dizziness at start of session however BP 114/83( 94) HR 76 bpm  Transfers/bed mobility:  pt completed supine>sit with MAX A +2. Pt needed increased assist to maneuver BLEs to EOB and elevate trunk into sitting. Pt attempting to use leg lifters on BLEs but needed at least MAX A to fully reposition BLEs. Pt completed 2 slide board transfers from EOB>TIS and TIS<>EOM with MAX A +2. Emphasis on pt directing transfer as c/o different people completing transfer different ways. However pt continues to require at least MOD verbal cues for correct set- up and sequencing of transfer especially head/hips relationship during transfer.   Therapeutic activity:  Worked on dynamic sitting balance from EOM with pt able to hold 2lb dowel rod from sitting with an emphasis on maintaining midline orientation with CGA- MINA, graded task up and had pt complete x10 bicep curls and x10 chest presses to provide increase balance challenge and increase overall strength/endurance.   Worked on lateral scoots from mat table with pt having great difficult with anterior lean/ head+hips relationship to simulate lateral scoots during slide board transfer despite max multimodal cues.   Ended session with pt  in w/c with  all needs within reach and safety belt alarm activated.                    Therapy Documentation Precautions:  Precautions Precautions: Back Precaution Booklet Issued: No Precaution Comments: Abdominal binder and thigh High ted hose for BP management for OOB. Required Braces or Orthoses: Spinal Brace Spinal Brace: Thoracolumbosacral orthotic, Applied in sitting position Restrictions Weight Bearing Restrictions: No    Pain: No indications of pain noted or reported.    Therapy/Group: Individual Therapy  Pollyann Glen [facility] 01/21/2023, 3:24 PM

## 2023-01-21 NOTE — Progress Notes (Addendum)
Enemeez enema admin yesterday 9/30 at 1834 by day nurse. No documentation of BM until 10/1 0900

## 2023-01-21 NOTE — Progress Notes (Addendum)
IP Rehab Bowel Program Documentation   Bowel Program Start time 1825   Dig Stim Indicated? Yes  Dig Stim Prior to Suppository or mini Enema X 1   Output from dig stim: none   Ordered intervention: Suppository No , mini enema Yes op   Repeat dig stim after Suppository or Mini enema  not needed   Output patient had large BM prior to program and additional large after program   Bowel Program Complete? Yes , handoff given yes   Patient Tolerated? Yes

## 2023-01-21 NOTE — Progress Notes (Signed)
Physical Therapy Session Note  Patient Details  Name: Edward Waller MRN: [id number] Date of Birth: [birth year]  Today's Date: 01/21/2023 PT Individual Time: 0800-0915, 1430-1530 PT Individual Time Calculation (min): 75 min, 60 min   Short Term Goals: Week 2:  PT Short Term Goal 1 (Week 2): Pt will perform SBT with mod A or better PT Short Term Goal 2 (Week 2): Pt will sit EOB with supervision or better with and without UE support PT Short Term Goal 3 (Week 2): Pt will require min a for supine<>sit  Skilled Therapeutic Interventions/Progress Updates:    Session 1: pt received in bed and agreeable to therapy. No complaint of pain.   Donned teds, shorts, shoes, and leg loops with tot A for time. Max a bed mobility for time, max a slideboard transfer, but pt demoes improving lift. slideboard transfer x 4 during session, with mod-max A and +2 for safety. Pt continues to require frequent cueing for sequencing and technique, esp hand placement.  Pt sat EOM with CGA to min A for 10 min, but reports light headedness that worsened with time. Improved quickly when pt positioned slightly reclined in TIS.   Therapist attempted to adjust [facility] brakes and inflate tires, but was limited by time, will complete at later time. Pt returned to bed at end of session d/t light headedness, reports symptoms improved before end of session. Pt remained in bed and handed off to NT.  Session 2: Pt recd in TIS, reporting no pain.  Pt transported to therapy gym for time management and energy conservation.   slideboard transfer <>mat table with  heavy mod fading to true max A at end of session with +2 for safety in case of posterior LOB. Session focused on sitting balance with decreasing UE support in various positions, finding center and using head to lean anterior/posterior, and  lateral scooting for transfer training. Bulk of time spent fine tuning UE push and body positioning for transfer training until pt's arms  became fatigued.   Pt returned to room and requested to stay up in chair while he has visitors. Remained with needs in reach and family present.   Therapy Documentation Precautions:  Precautions Precautions: Back Precaution Booklet Issued: No Precaution Comments: Abdominal binder and thigh High ted hose for BP management for OOB. Required Braces or Orthoses: Spinal Brace Spinal Brace: Thoracolumbosacral orthotic, Applied in sitting position Restrictions Weight Bearing Restrictions: No General:       Therapy/Group: Individual Therapy  [name] 01/21/2023, 9:34 AM

## 2023-01-21 NOTE — Patient Care Conference (Signed)
Inpatient RehabilitationTeam Conference and Plan of Care Update Date: 01/21/2023   Time: 11:22 AM    Patient Name: Edward Waller      Medical Record Number: [id number]  Date of Birth: 11-06-1932 Sex: Male         Room/Bed: 4W08C/4W08C-01 Payor Info: Payor: [employer] / Plan: [facility] [employer] / Product Type: *No Product type* /    Admit Date/Time:  01/10/2023  3:40 PM  Primary Diagnosis:  Complete paraplegia [facility])  Hospital Problems: Principal Problem:   Complete paraplegia [facility])    Expected Discharge Date: Expected Discharge Date: 02/07/23  Team Members Present: Physician leading conference: Dr. Genice Rouge Social Worker Present: Dossie Der, LCSW Nurse Present: Chana Bode, RN PT Present: Bernie Covey, PT OT Present: Roney Mans, OT SLP Present: Feliberto Gottron, SLP PPS Coordinator present : Edson Snowball, PT     Current Status/Progress Goal Weekly Team Focus  Bowel/Bladder   Patient has urine retention.  He is unable to urinate at all.  Cathing him Q4 with about 500 out each time.  Some irritation noted to urethra.  light bleeding at times.   Using a coude to cath him.   Return of sensation and ability to urinate.   continue PT and hope that some sensation or ability to urinate returns.  If not, may want to consider foley or suprapubic catheter.    Swallow/Nutrition/ Hydration               ADL's   LB dressing-tot A: UB dressing-min A: SB transfers mod A: sitting balance with UE support-supervision and max A wihtout UE supprt;   min A overall, mod A toileting   bed mobility, sitting balance, SB transfers, education Barriers: care giver support at home    Mobility   mod-max bed mobility, improving with use of leg loops, mod-max with transfer board, initiaing MWC   min A transfer goals, supervision w/c  transfer training, w/c eval    Communication                Safety/Cognition/ Behavioral Observations                Pain   Patient uses 1-10 pain scale and denies pain while in bed.   Continue with adequate pain control.   Multi modals to reduce the use of opioids.    Skin   at this time skin is ok but he is very high risk for breakdown.   avoid breakdown of skin.  Increase protein, Q2 turns, good hygeine.      Discharge Planning:  Plans remains for pt to d/c to home with his wife who is not able to provide physical assistance due to a back injury. She reports that she will assist as much is able too, however, thinks he will likely need placement after discharge. PRN support from their dtr. VA benefits include HH therapies, DME, and home aide program. If SNF is needed, will need to use [facility]. SW will confirm there are no barriers to discharge. -- -- --   Team Discussion: Patient post complete paraplegia with numbness in fingertips. Reports discomfort but declines other meds.    Patient on target to meet rehab goals: Currently needs total assist for lower body care and min assist for upper body care. Requiring I+O catheterization q 4 hours using specialty catheters; patient not interested in self catheterizations.  Unable to manage pressure relief in a manual chair. Goals for discharge set for min -  mod assist overall.  *See Care Plan and progress notes for long and short-term goals.   Revisions to Treatment Plan:  Downgraded goals Celexa for depression Neuropsych consult Grounds pass   Teaching Needs: Safety, medications, skin care, transfers, toileting, etc.  Current Barriers to Discharge: Decreased caregiver support, Home enviroment access/layout, Neurogenic bowel and bladder, and Behavior  Possible Resolutions to Barriers: Family education SNF recommended     Medical Summary Current Status: chronic   changed othydorphilic catheter or coude-worked better than coude actually-self contained cahteters- spasticity stable so far;on bowel program-  no skin issues right now   Barriers to Discharge: Medical stability;Incontinence;Neurogenic Bowel & Bladder;Behavior/Mood;Self-care education;Spasticity;Weight bearing restrictions  Barriers to Discharge Comments: d/c plan might be SNF; wife cannot care for him; has no VA benefits; cannot pressure relief inmanual w/c- spasticity, neuyrogenic bowel and bladder- BP hypotensive; Possible Resolutions to [employer] Focus: on bowel program- won't learn to cath yet- not sure if will- doesn't want to participate-  will order gorunds pass with wife; d/c 10/18   Continued Need for Acute Rehabilitation Level of Care: The patient requires daily medical management by a physician with specialized training in physical medicine and rehabilitation for the following reasons: Direction of a multidisciplinary physical rehabilitation program to maximize functional independence : Yes Medical management of patient stability for increased activity during participation in an intensive rehabilitation regime.: Yes Analysis of laboratory values and/or radiology reports with any subsequent need for medication adjustment and/or medical intervention. : Yes   I attest that I was present, lead the team conference, and concur with the assessment and plan of the team.   Pamelia Hoit 01/21/2023, 3:21 PM

## 2023-01-22 NOTE — Progress Notes (Signed)
Occupational Therapy Session Note  Patient Details  Name: Edward Waller MRN: [id number] Date of Birth: 09/20/1932  Today's Date: 01/22/2023 OT Individual Time: 0930-1040 OT Individual Time Calculation (min): 70 min    Short Term Goals: Week 2:  OT Short Term Goal 1 (Week 2): Pt will recall presure release schedule with min verbal cues OT Short Term Goal 2 (Week 2): Pt will maintain sitting balance with min A consistently OT Short Term Goal 3 (Week 2): Pt will don pullover shirt with min A in unsupported sitting OT Short Term Goal 4 (Week 2): [name] transfers with mod A  Skilled Therapeutic Interventions/Progress Updates:    Pt resting in w/c upon arrival. OT intervention with focus on SB transfers and sitting balance EOM. SB transfers with mod A and max verbal cues for technique. Block practice unsupported sitting balance with/without UE support. Pt able to maintain sitting balance for 5 and 10 secs x 5. Block practice lateral leans to R/L with CGA and max verbal cues for technique. Pt returned to w/c and remained in w/c with all needs wihtin reach.   Therapy Documentation Precautions:  Precautions Precautions: Back Precaution Booklet Issued: No Precaution Comments: Abdominal binder and thigh High ted hose for BP management for OOB. Required Braces or Orthoses: Spinal Brace Spinal Brace: Thoracolumbosacral orthotic, Applied in sitting position Restrictions Weight Bearing Restrictions: No   Pain:  Pt denies pain this morning   Therapy/Group: Individual Therapy  [name] 01/22/2023, 10:46 AM

## 2023-01-22 NOTE — Progress Notes (Signed)
Wife performed in/out cath for patient using clean technique with home self cath kit. Explanation and minimal hands on assistance from RN. Will continue to educate wife on catherization.

## 2023-01-22 NOTE — Progress Notes (Signed)
Patient ID: Edward Waller, male   DOB: [birth year], 89 y.o.   MRN: [id number]  Met with pt to inform of tam conference update regarding progress this week. MD gave a grounds pass and pt is aware. Neuro-psych to see to assist with coping. Bowel and bladder programs begun and education started. Aware target discharge date is still 10/18.
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
## 2023-01-22 NOTE — Progress Notes (Signed)
IP Rehab Bowel Program Documentation    Bowel Program Start time         Dig Stim Indicated? Yes  Dig Stim Prior to Suppository or mini Enema X 1    Output from dig stim: Moderate   Ordered intervention: Suppository No , mini enema Yes ,    Repeat dig stim after Suppository or Mini enema  none     Output?  Small     Bowel Program Complete? Yes , handoff given yes     Patient Tolerated? Yes
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
## 2023-01-23 NOTE — Progress Notes (Signed)
Physical Therapy Session Note  Patient Details  Name: Edward Waller MRN: [id number] Date of Birth: August 13, 1932  Today's Date: 01/23/2023 PT Individual Time: 0800-0900, 1300-1415 PT Individual Time Calculation (min): 60 min, 75 min   Short Term Goals: Week 2:  PT Short Term Goal 1 (Week 2): Pt will perform SBT with mod A or better PT Short Term Goal 2 (Week 2): Pt will sit EOB with supervision or better with and without UE support PT Short Term Goal 3 (Week 2): Pt will require min a for supine<>sit  Skilled Therapeutic Interventions/Progress Updates:    Session 1: pt received in bed and agreeable to therapy. Donned ted hose, shoes, leg loops, and shorts tot A. Provided education on spasticity management d/t noted increase in spasm on this date. Provided passive ROM/stretching to assist with positioning. Supine>sit with max BLE management and mod trunk management, cues for maintaining anterior lean while sitting up to prevent posterior LOB. CGA to maintain sitting EOB.  Pt then participated in slideboard transfer with max A and +2 stabilizing board. Pt with improved anterior lean and UE involvement, but sometimes slow to initiate and with difficulty maintaining balance on board. Pt reports mild light headedness that resolved when reclined in TIS.   Pt participated in problem solving with leg loops. Attempted modifying for lateral hand hold to improve pt's grip, but unable to find appropriate adjustment at this time. Pt returned to room and remained in chair with needs in reach.   Session 2: Pt recd in TIS, No complaint of pain.   Pt transported to therapy gym, but reports feeling off/lightheaded. Elevated legs in chair, then pt assisted back to bed with +2 mod-max A.  On further discussion, pt reports he feels "off" when he moves his head/neck, reporting visual changes including double vision. He describes intermittent light headedness but that is not his primary symptom.  When turning head  to R side, pt reports double vision and vision darkening. No symptoms to the L side. Pt is only able to tolerate turning <45 deg BIL. Cursory occulomotor exam does not reveal nystagmus or dizziness. Extended time spent discussing symptoms and vestibular education.   Pt remained in bed at end of session and was left with all needs in reach and alarm active.    Therapy Documentation Precautions:  Precautions Precautions: Back Precaution Booklet Issued: No Precaution Comments: Abdominal binder and thigh High ted hose for BP management for OOB. Required Braces or Orthoses: Spinal Brace Spinal Brace: Thoracolumbosacral orthotic, Applied in sitting position Restrictions Weight Bearing Restrictions: No General:       Therapy/Group: Individual Therapy  [name] 01/23/2023, 12:34 PM

## 2023-01-23 NOTE — Progress Notes (Signed)
IP Rehab Bowel Program Documentation   Bowel Program Start time [id number]  Dig Stim Indicated? Yes  Dig Stim Prior to Suppository or mini Enema X 1   Output from dig stim: Minimal  Ordered intervention: Suppository Yes , mini enema Yes ,   Repeat dig stim after Suppository or Mini enema  X 1,  Output? Large   Bowel Program Complete? Yes , handoff given yes. Second shift completing bowel program notes.   Patient Tolerated? Yes

## 2023-01-23 NOTE — Progress Notes (Signed)
Occupational Therapy Session Note  Patient Details  Name: Edward Waller MRN: [id number] Date of Birth: 1932/10/30  Today's Date: 01/23/2023 OT Individual Time: 0930-1040 OT Individual Time Calculation (min): 70 min    Short Term Goals: Week 2:  OT Short Term Goal 1 (Week 2): Pt will recall presure release schedule with min verbal cues OT Short Term Goal 2 (Week 2): Pt will maintain sitting balance with min A consistently OT Short Term Goal 3 (Week 2): Pt will don pullover shirt with min A in unsupported sitting OT Short Term Goal 4 (Week 2): [name] transfers with mod A  Skilled Therapeutic Interventions/Progress Updates:    Pt resting in w/c upon arrival. Initial focus on doffing/donning pullover shirt while seated in w/c. Pt completed task with mod A. OT intervention with focus on SB tranfsers, sitting balance, and BUE therex to increase pt's indpendence with BADLs. SB transfers with mod A level surface-max verbal cues for technique. Supported sitting balance with CGA. Max A for supported sitting balance. Block practice of lateral leans to facilitate placement of SB. BUE therex with 3# bar seated in w/c. Pt report occasional lightheadedness but BP WNL-113/63 and 110/59. Pt returned to room and remained in w/c. All needs within reach.   Therapy Documentation Precautions:  Precautions Precautions: Back Precaution Booklet Issued: No Precaution Comments: Abdominal binder and thigh High ted hose for BP management for OOB. Required Braces or Orthoses: Spinal Brace Spinal Brace: Thoracolumbosacral orthotic, Applied in sitting position Restrictions Weight Bearing Restrictions: No Pain: Pt denies pain this morning  Therapy/Group: Individual Therapy  [name] 01/23/2023, 12:06 PM
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
## 2023-01-23 NOTE — Progress Notes (Signed)
IP Rehab Bowel Program Documentation    Bowel Program Start time         Dig Stim Indicated? Yes  Dig Stim Prior to Suppository or mini Enema X 1    Output from dig stim: Moderate   Ordered intervention: Suppository No , mini enema Yes ,    Repeat dig stim after Suppository or Mini enema  none     Output?  Small     Bowel Program Complete? Yes , handoff given yes     Patient Tolerated? Yes

## 2023-01-24 DIAGNOSIS — E440 Moderate protein-calorie malnutrition: Secondary | ICD-10-CM | POA: Insufficient documentation

## 2023-01-24 LAB — COMPREHENSIVE METABOLIC PANEL
ALT: 185 U/L — ABNORMAL HIGH (ref 0–44)
AST: 74 U/L — ABNORMAL HIGH (ref 15–41)
Albumin: 2.2 g/dL — ABNORMAL LOW (ref 3.5–5.0)
Alkaline Phosphatase: 104 U/L (ref 38–126)
Anion gap: 8 (ref 5–15)
BUN: 19 mg/dL (ref 8–23)
CO2: 25 mmol/L (ref 22–32)
Calcium: 8.3 mg/dL — ABNORMAL LOW (ref 8.9–10.3)
Chloride: 98 mmol/L (ref 98–111)
Creatinine, Ser: 0.67 mg/dL (ref 0.61–1.24)
GFR, Estimated: >60 mL/min (ref >60)
Glucose, Bld: 252 mg/dL — ABNORMAL HIGH (ref 70–99)
Potassium: 4.4 mmol/L (ref 3.5–5.1)
Sodium: 131 mmol/L — ABNORMAL LOW (ref 135–145)
Total Bilirubin: 0.5 mg/dL (ref 0.3–1.2)
Total Protein: 6.7 g/dL (ref 6.5–8.1)

## 2023-01-24 MED ORDER — ACETAMINOPHEN 325 MG PO TABS
325.0000 mg | ORAL_TABLET | Freq: Four times a day (QID) | ORAL | Status: DC | PRN
  Administered 2023-01-30 – 2023-02-13 (×2): 325 mg via ORAL
  Filled 2023-01-24 (×3): qty 1

## 2023-01-24 MED ORDER — BACLOFEN 10 MG PO TABS
10.0000 mg | ORAL_TABLET | Freq: Three times a day (TID) | ORAL | Status: DC
  Administered 2023-01-24 – 2023-02-01 (×26): 10 mg via ORAL
  Filled 2023-01-24 (×26): qty 1

## 2023-01-24 NOTE — Progress Notes (Signed)
Occupational Therapy Weekly Progress Note  Patient Details  Name: Edward Waller MRN: [id number] Date of Birth: May 31, 1932  Beginning of progress report period: January 16, 2023 End of progress report period: January 24, 2023  atient has met 2 of 4 short term goals.  Pt making slow but steady progress with bed monbility, UB dressing, sitting balance, and SB transfers. Supine>sit EOB with max A. Supported sitting balance with CGA/close supervision. Unsupported sitting balance with max A. SB transfers with mod A. UB dressing with min A supported sitting and mod A unsupported sitting. Dependent for LB dressing.   Patient continues to demonstrate the following deficits: {impairments:3041632} and therefore will continue to benefit from skilled OT intervention to enhance overall performance with {ADL/iADL:3041649}.  Patient {LTG progression:3041653}.  {plan of VZDG:3875643}  OT Short Term Goals Week 2:  OT Short Term Goal 1 (Week 2): Pt will recall presure release schedule with min verbal cues OT Short Term Goal 1 - Progress (Week 2): Met OT Short Term Goal 2 (Week 2): Pt will maintain sitting balance with min A consistently OT Short Term Goal 2 - Progress (Week 2): Met OT Short Term Goal 3 (Week 2): Pt will don pullover shirt with min A in unsupported sitting OT Short Term Goal 3 - Progress (Week 2): Progressing toward goal OT Short Term Goal 4 (Week 2): [name] transfers with mod A OT Short Term Goal 4 - Progress (Week 2): Progressing toward goal Week 3:  OT Short Term Goal 1 (Week 3): Pt will don pullover shirt with min A in unsupported sitting OT Short Term Goal 2 (Week 3): [name] transfers with mod A OT Short Term Goal 3 (Week 3): Pt will complete LB dressing tasks with max A at bed level   [name] 01/24/2023, 12:08 PM

## 2023-01-24 NOTE — Progress Notes (Signed)
IP Rehab Bowel Program Documentation   Bowel Program Start time 1820   Dig Stim Indicated? Yes  Dig Stim Prior to Suppository or mini Enema X 1   Output from dig stim: Small  Ordered intervention: Suppository Yes , mini enema Yes ,   Repeat dig stim after Suppository or Mini enema  X 1,  Output? Small   Bowel Program Complete? Yes , handoff given  Yes   Patient Tolerated? Yes

## 2023-01-24 NOTE — Progress Notes (Signed)
Physical Therapy Session Note  Patient Details  Name: Edward Waller MRN: [id number] Date of Birth: 08-24-1932  Today's Date: 01/24/2023 PT Individual Time: 1100-1200, 1500-1530 PT Individual Time Calculation (min): 60 min, 30 min    Short Term Goals: Week 2:  PT Short Term Goal 1 (Week 2): Pt will perform SBT with mod A or better PT Short Term Goal 2 (Week 2): Pt will sit EOB with supervision or better with and without UE support PT Short Term Goal 3 (Week 2): Pt will require min a for supine<>sit  Skilled Therapeutic Interventions/Progress Updates:    Session 1: pt received in bed and agreeable to therapy. No complaint of pain.   Donned leg loops and shoes tot A. Pt able to participate in mobilizing BLE off bed with min A for each leg, then cueing to use ue to shift into sidelying and mod A fading to min a for trunk elevation on second rep. Tot +2 for returning to supine d/t dizziness.   Pt reports dizziness/wooziness in sitting that worsened with time initially. BP in supine=117/65 (82). Discussed possibility of cervicogenic dizziness since head movements in supine also provoked symptoms. Recommended pt avoid those movements for now and not provoke symptoms. After several minutes, pt returned to sitting and BP=110/72 (84) but asymptomatic when not moving his head.   slideboard transfer to liberty w/c with mod-max with +2 standing by and assisting with stabilizing board. Rest of session spent on w/c propulsion. Pt able to propel well, but chair sizing is off d/t hemi height.   Pt returned to room and remained in w/c, with needs in reach.   Session 2: Pt seated in w/c on arrival and agreeable to therapy. Pt sleeping, but easily roused and reports feeling "much better" this pm. Pt transported to therapy gym for time management and energy conservation.   Utilized standing frame for upright tolerance training and spasticity management. Pt participated in 2 bouts of standing in standing  frame with supervision for no assist for balance once upright. Pt was able to maintain standing up to 4 min before becoming light headed and needed to sit. Symptoms resolved quickly in sitting.   Pt returned to room and performed slideboard transfer with +2 for safety up mod a for some scoots. Up to max for last scoot d/t poor positioning. Greatly improved performance with therapist using "over the back" technique and pt cued to put shoulders near therapist's hip for greater lateral and anterior lean.   Pt returned to bed with max x 1 for BLE management and assist to reposition in bed once there. Pt positioned therapeutically with pillows  and was left with all needs in reach and alarm active.   Therapy Documentation Precautions:  Precautions Precautions: Back Precaution Booklet Issued: No Precaution Comments: Abdominal binder and thigh High ted hose for BP management for OOB. Required Braces or Orthoses: Spinal Brace Spinal Brace: Thoracolumbosacral orthotic, Applied in sitting position Restrictions Weight Bearing Restrictions: No General:       Therapy/Group: Individual Therapy  [name] 01/24/2023, 12:35 PM

## 2023-01-24 NOTE — Progress Notes (Signed)
Patient ID: Edward Waller, male   DOB: [birth year], 89 y.o.   MRN: [id number]  Medical team recommends family meeting to discuss plan of care at discharge. SW will follow-up with pt wife.   SW made efforts to contact pt wife but cell phone VM is full, and no answer on home phone. SW will continue to make attempts to connect.   *SW spoke with patient's [name] and confirms family meeting on Thursday, 10/10 at 10am. Wife continues to report she is the primary caregiver. Will discuss plan of care during meeting.   Cecile Sheerer, MSW, LCSWA Office: [phone] Cell: [phone] Fax: [phone]

## 2023-01-24 NOTE — Progress Notes (Signed)
Occupational Therapy Session Note  Patient Details  Name: Edward Waller MRN: [id number] Date of Birth: [birth year]  Today's Date: 01/24/2023 OT Individual Time: 1300-1345 OT Individual Time Calculation (min): 45 min    Short Term Goals: Week 3:  OT Short Term Goal 1 (Week 3): Pt will don pullover shirt with min A in unsupported sitting OT Short Term Goal 2 (Week 3): [name] transfers with mod A OT Short Term Goal 3 (Week 3): Pt will complete LB dressing tasks with max A at bed level  Skilled Therapeutic Interventions/Progress Updates:    Pt resting in TIS w/c upon arrival with RN present connecting IV. Initial focus on discharge planning. Pt issed home measurement sheet for completion. BUE therex with yellow theraband-circuit exercises reaching out and including biceps currls-3x11. Pt remained in w/c with all needs within reach.   Therapy Documentation Precautions:  Precautions Precautions: Back Precaution Booklet Issued: No Precaution Comments: Abdominal binder and thigh High ted hose for BP management for OOB. Required Braces or Orthoses: Spinal Brace Spinal Brace: Thoracolumbosacral orthotic, Applied in sitting position Restrictions Weight Bearing Restrictions: No Pain:  PT denies pain this afternoon    Therapy/Group: Individual Therapy  [name] 01/24/2023, 2:47 PM

## 2023-01-24 NOTE — Progress Notes (Addendum)
Patient instructed in self cathing reluctantly. Patient required 75% assistance using verbal and tactile cueing d/t decreased hand dexterity. Patient states uncertainty he will be able to do it independently due to not having sensation and small size of penis along with the decrease hand dexterity. Will continue to encourage and assist patient as needed.

## 2023-01-24 NOTE — Progress Notes (Signed)
Occupational Therapy Session Note  Patient Details  Name: Edward Waller MRN: [id number] Date of Birth: 01-Mar-1933  Today's Date: 01/24/2023 OT Individual Time: 1000-1045 OT Individual Time Calculation (min): 45 min  and Today's Date: 01/24/2023 OT Missed Time: 30 Minutes Missed Time Reason: Nursing care   Short Term Goals: Week 2:  OT Short Term Goal 1 (Week 2): Pt will recall presure release schedule with min verbal cues OT Short Term Goal 2 (Week 2): Pt will maintain sitting balance with min A consistently OT Short Term Goal 3 (Week 2): Pt will don pullover shirt with min A in unsupported sitting OT Short Term Goal 4 (Week 2): [name] transfers with mod A  Skilled Therapeutic Interventions/Progress Updates:    Pt missed 30 mins skilled OT services 2/2 nursing care (I/O and bladder scan.) OT intervention with focus on bed mobility to facilitate UB/LB dressing tasks at bed level. Rolling to Rt with min A and Lt with mod A for LE mgmt. Pt able to pull to upright sitting in bed with use of bed rails but requires assistance to maintain to don/doff pull over shirt. Dependent for LB dressing. Pt reamined in bed with all needs within reach.  Therapy Documentation Precautions:  Precautions Precautions: Back Precaution Booklet Issued: No Precaution Comments: Abdominal binder and thigh High ted hose for BP management for OOB. Required Braces or Orthoses: Spinal Brace Spinal Brace: Thoracolumbosacral orthotic, Applied in sitting position Restrictions Weight Bearing Restrictions: No General: General OT Amount of Missed Time: 30 Minutes Pain:  Pt denies pain but reports ongoing BLE spasms, MD aware  Therapy/Group: Individual Therapy  [name] 01/24/2023, 12:02 PM

## 2023-01-24 NOTE — Plan of Care (Signed)
  Problem: RH Balance Goal: LTG: Patient will maintain dynamic sitting balance (OT) Description: LTG:  Patient will maintain dynamic sitting balance with assistance during activities of daily living (OT) Flowsheets (Taken 01/24/2023 1535) LTG: Pt will maintain dynamic sitting balance during ADLs with: (downgraded JLS) Minimal Assistance - Patient > 75%   Problem: RH Bathing Goal: LTG Patient will bathe all body parts with assist levels (OT) Description: LTG: Patient will bathe all body parts with assist levels (OT) Flowsheets (Taken 01/24/2023 1535) LTG: Pt will perform bathing with assistance level/cueing: (downgraded JLS) Moderate Assistance - Patient 50 - 74% Note: downgraded JLS    Problem: RH Dressing Goal: LTG Patient will perform upper body dressing (OT) Description: LTG Patient will perform upper body dressing with assist, with/without cues (OT). Flowsheets (Taken 01/24/2023 1535) LTG: Pt will perform upper body dressing with assistance level of: (downgraded JLS) -- Goal: LTG Patient will perform lower body dressing w/assist (OT) Description: LTG: Patient will perform lower body dressing with assist, with/without cues in positioning using equipment (OT) Flowsheets (Taken 01/24/2023 1535) LTG: Pt will perform lower body dressing with assistance level of: (d/c goal caregiver to assist) -- Note: d/c goal caregiver to assist    Problem: RH Toileting Goal: LTG Patient will perform toileting task (3/3 steps) with assistance level (OT) Description: LTG: Patient will perform toileting task (3/3 steps) with assistance level (OT)  Flowsheets (Taken 01/24/2023 1535) LTG: Pt will perform toileting task (3/3 steps) with assistance level: (d/c goal) -- Note: D/c goal    Problem: RH Toilet Transfers Goal: LTG Patient will perform toilet transfers w/assist (OT) Description: LTG: Patient will perform toilet transfers with assist, with/without cues using equipment (OT) Flowsheets (Taken 01/24/2023  1535) LTG: Pt will perform toilet transfers with assistance level of: (d/c goal caregiver to assist) -- Note: d/c goal caregiver to assist    Problem: RH Tub/Shower Transfers Goal: LTG Patient will perform tub/shower transfers w/assist (OT) Description: LTG: Patient will perform tub/shower transfers with assist, with/without cues using equipment (OT) Flowsheets (Taken 01/24/2023 1535) LTG: Pt will perform tub/shower stall transfers with assistance level of: (d/cgoal) -- Note: D/c goal
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
## 2023-01-25 DIAGNOSIS — R1319 Other dysphagia: Secondary | ICD-10-CM

## 2023-01-25 NOTE — Progress Notes (Signed)
Bowel program done. Digital stimulation done then suppository was placed. Another round of digital stimulation done after 30 mins. Scant stool seen after the bowel program.

## 2023-01-25 NOTE — Progress Notes (Signed)
Patient had an episode of small stool incontinence. Digital stimulation done with large mushy stool taken. Peri care done. Patient repositioned to comfort.
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
## 2023-01-26 NOTE — Progress Notes (Signed)
Occupational Therapy Session Note  Patient Details  Name: Edward Waller MRN: [id number] Date of Birth: [birth year]  Today's Date: 01/26/2023 OT Individual Time: 1100-1200 OT Individual Time Calculation (min): 60 min    Short Term Goals: Week 3:  OT Short Term Goal 1 (Week 3): Pt will don pullover shirt with min A in unsupported sitting OT Short Term Goal 2 (Week 3): [name] transfers with mod A OT Short Term Goal 3 (Week 3): Pt will complete LB dressing tasks with max A at bed level  Skilled Therapeutic Interventions/Progress Updates:    Pt resting in [place] TIS upon arrival. OT intervention with focus on SB transfers and sitting balance EOM. SB transfer w/c>EOM (incline) with max A+2. Supported and unsupported sitting balance tasks with focus on acquiring balance point and maintaining while engage contralateral UE in initiating functional task. Pt able to maintain unsupported sitting balance ~5 secs with close supervision. Also focused on lateral leans to R/L with focus on hand/UE placement and maintaining anterior lean through task. Pt performs lateral lean with min A. SB transfer back to w/c. Pt remained in w/c with all needs withn reach.   Therapy Documentation Precautions:  Precautions Precautions: Back Precaution Booklet Issued: No Precaution Comments: Abdominal binder and thigh High ted hose for BP management for OOB. Required Braces or Orthoses: Spinal Brace Spinal Brace: Thoracolumbosacral orthotic, Applied in sitting position Restrictions Weight Bearing Restrictions: No   Pain:  Pt reports BLE spasms care occasionally painful; MD and RN aware   Therapy/Group: Individual Therapy  [name] 01/26/2023, 12:06 PM

## 2023-01-26 NOTE — Progress Notes (Signed)
Occupational Therapy Session Note  Patient Details  Name: Edward Waller MRN: [id number] Date of Birth: 1933/02/02  Today's Date: 01/26/2023 OT Individual Time: 0915-1000 OT Individual Time Calculation (min): 45 min    Short Term Goals: Week 1:  OT Short Term Goal 1 (Week 1): Pt will recall presure release schedule with min verbal cues OT Short Term Goal 1 - Progress (Week 1): Progressing toward goal OT Short Term Goal 2 (Week 1): Pt will tolerated sitting up in wc for 30 minutes or greater OT Short Term Goal 2 - Progress (Week 1): Met OT Short Term Goal 3 (Week 1): Pt will maintain sitting balance with min A consistently OT Short Term Goal 3 - Progress (Week 1): Progressing toward goal  Skilled Therapeutic Interventions/Progress Updates:    Patient lying supine in bed indicating he didn't rest well secondary to spasms in his LLE and stomach region patient indicated that nursing was informed. The pt stated that his has no pain response, just spasms . The pt was able to bathe his UB with s/u assist, he was Max A with his LB for bathing.  The pt was able to donn his over head shirt with MinA and was MaxA for donning his brief and pants. The pt's leg straps were place on  and the pt was instructed to move 1 leg at a time for bring BLE to EOB requiring  ModA for placement incorporating the grab bar for additional balance..  The pt was able to transfer from EOB to the w/c using the sliding board with Mod A x2, the pt required the same level of assistance for proper placement.  At the end of the session after the pt brushed his teeth and combed his hair at sink with s/u assist, he was positioned were he watch tv with his call light and bedside table within reach with all additional needs addressed.     Therapy Documentation Precautions:  Precautions Precautions: Back Precaution Booklet Issued: No Precaution Comments: Abdominal binder and thigh High ted hose for BP management for OOB. Required  Braces or Orthoses: Spinal Brace Spinal Brace: Thoracolumbosacral orthotic, Applied in sitting position Restrictions Weight Bearing Restrictions: No Therapy/Group: Individual Therapy  Lavona Mound 01/26/2023, 5:28 PM

## 2023-01-26 NOTE — Progress Notes (Signed)
Physical Therapy Session Note  Patient Details  Name: Edward Waller MRN: [id number] Date of Birth: 1932-07-19  Today's Date: 01/26/2023 PT Individual Time: 1349-1430 PT Individual Time Calculation (min): 41 min   Short Term Goals: Week 2:  PT Short Term Goal 1 (Week 2): Pt will perform SBT with mod A or better PT Short Term Goal 2 (Week 2): Pt will sit EOB with supervision or better with and without UE support PT Short Term Goal 3 (Week 2): Pt will require min a for supine<>sit  Skilled Therapeutic Interventions/Progress Updates:      Therapy Documentation Precautions:  Precautions Precautions: Back Precaution Booklet Issued: No Precaution Comments: Abdominal binder and thigh High ted hose for BP management for OOB. Required Braces or Orthoses: Spinal Brace Spinal Brace: Thoracolumbosacral orthotic, Applied in sitting position Restrictions Weight Bearing Restrictions: No  Pt agreeable to PT session with emphasis on wheelchair mobility on level and unlevel terrain. Pt without reports of pain. Pt supervision with wheelchair propulsion in [place] ~100 ft and presents with decreased stride length. Pt able to correct but not maintain with verbal cueing due to fatigue and decrease shoulder range of motion. Pt transported remaining distance to ortho gym for ramp navigation at wheelchair level. Pt reports "lightheadedness". Pt states symptoms began with onset of wheelchair propulsion. Pt reports he has not practice this skill and expresses "anxiety" regarding going up the ramp. Pt reports history of anxiety and says it occurs when he is about to perform new task or with transfers with different staff members. PT provided support and stated the benefits of practicing transfer techniques with various staff members.   Pt willing to try ramp navigation and performed mod A twice with verbal cues for arm placement with descending ramp with w/c. PT applied thera band to w/c wheels to use in conjunction  with wheelchair gloves and pt with improved propulsion ~100 ft.Pt returned to room and requested to sit up as he had visitors. Pt left with all needs in reach.     Therapy/Group: Individual Therapy  Truitt Leep Truitt Leep PT, DPT  01/26/2023, 3:14 PM

## 2023-01-26 NOTE — Progress Notes (Addendum)
IP Rehab Bowel Program Documentation   Bowel Program Start time 1900   Dig Stim Indicated? Yes  Dig Stim Prior to Suppository or mini Enema X 2   Output from dig stim: Small  Ordered intervention: Suppository Yes , mini enema No ,   Repeat dig stim after Suppository or Mini enema  X 1,  Output? Moderate   Bowel Program Complete? No , handoff given Silvestre Mesi, RN; bowel program completed by 2nd shift RN  Patient Tolerated? Yes

## 2023-01-27 LAB — CBC WITH DIFFERENTIAL/PLATELET
Abs Immature Granulocytes: 0.17 10*3/uL — ABNORMAL HIGH (ref 0.00–0.07)
Basophils Absolute: 0.1 10*3/uL (ref 0.0–0.1)
Basophils Relative: 1 %
Eosinophils Absolute: 0.1 10*3/uL (ref 0.0–0.5)
Eosinophils Relative: 2 %
HCT: 44 % (ref 39.0–52.0)
Hemoglobin: 14.4 g/dL (ref 13.0–17.0)
Immature Granulocytes: 2 %
Lymphocytes Relative: 15 %
Lymphs Abs: 1.2 10*3/uL (ref 0.7–4.0)
MCH: 31.8 pg (ref 26.0–34.0)
MCHC: 32.7 g/dL (ref 30.0–36.0)
MCV: 97.1 fL (ref 80.0–100.0)
Monocytes Absolute: 0.4 10*3/uL (ref 0.1–1.0)
Monocytes Relative: 6 %
Neutro Abs: 5.7 10*3/uL (ref 1.7–7.7)
Neutrophils Relative %: 74 %
Platelets: 523 10*3/uL — ABNORMAL HIGH (ref 150–400)
RBC: 4.53 MIL/uL (ref 4.22–5.81)
RDW: 13.2 % (ref 11.5–15.5)
WBC: 7.7 10*3/uL (ref 4.0–10.5)
nRBC: 0 % (ref 0.0–0.2)

## 2023-01-27 LAB — COMPREHENSIVE METABOLIC PANEL
ALT: 187 U/L — ABNORMAL HIGH (ref 0–44)
AST: 82 U/L — ABNORMAL HIGH (ref 15–41)
Albumin: 2.5 g/dL — ABNORMAL LOW (ref 3.5–5.0)
Alkaline Phosphatase: 116 U/L (ref 38–126)
Anion gap: 14 (ref 5–15)
BUN: 15 mg/dL (ref 8–23)
CO2: 29 mmol/L (ref 22–32)
Calcium: 9.2 mg/dL (ref 8.9–10.3)
Chloride: 94 mmol/L — ABNORMAL LOW (ref 98–111)
Creatinine, Ser: 0.7 mg/dL (ref 0.61–1.24)
GFR, Estimated: >60 mL/min (ref >60)
Glucose, Bld: 144 mg/dL — ABNORMAL HIGH (ref 70–99)
Potassium: 4.6 mmol/L (ref 3.5–5.1)
Sodium: 137 mmol/L (ref 135–145)
Total Bilirubin: 0.4 mg/dL (ref 0.3–1.2)
Total Protein: 7.3 g/dL (ref 6.5–8.1)

## 2023-01-27 MED ORDER — LIDOCAINE HCL URETHRAL/MUCOSAL 2 % EX GEL: 1.0000 | Freq: Once | CUTANEOUS | Status: DC

## 2023-01-27 MED ORDER — MIRABEGRON ER 25 MG PO TB24
25.0000 mg | ORAL_TABLET | Freq: Every day | ORAL | Status: DC
  Administered 2023-01-27 – 2023-02-03 (×8): 25 mg via ORAL
  Filled 2023-01-27 (×8): qty 1

## 2023-01-27 MED ORDER — SENNA 8.6 MG PO TABS
1.0000 | ORAL_TABLET | Freq: Every day | ORAL | Status: DC
  Administered 2023-01-28 – 2023-02-02 (×6): 8.6 mg via ORAL
  Filled 2023-01-27 (×6): qty 1

## 2023-01-27 NOTE — Progress Notes (Signed)
Occupational Therapy Session Note  Patient Details  Name: RAQUEL SAYRES MRN: [id number] Date of Birth: [birth year]  Today's Date: 01/27/2023 OT Individual Time: 1300-1410 OT Individual Time Calculation (min): 70 min    Short Term Goals: Week 3:  OT Short Term Goal 1 (Week 3): Pt will don pullover shirt with min A in unsupported sitting OT Short Term Goal 2 (Week 3): [name] transfers with mod A OT Short Term Goal 3 (Week 3): Pt will complete LB dressing tasks with max A at bed level  Skilled Therapeutic Interventions/Progress Updates:    Pt resting in bed upon arrival. Pt requested to work on rolling in bed and BUE therex. Block practice rolling R/L with use of bed rail. Min A to Lt and mod A to Rt. BUE therex with yellow theraband, 3# bar, and 5# barbell. Rowing with theraband 3x10. Chest presses with 3# bar 3x10. Biceps curls with 5# bar bell 3x15. Pt remained in bed with all needs within reach.   Therapy Documentation Precautions:  Precautions Precautions: Back Precaution Booklet Issued: No Precaution Comments: Abdominal binder and thigh High ted hose for BP management for OOB. Required Braces or Orthoses: Spinal Brace Spinal Brace: Thoracolumbosacral orthotic, Applied in sitting position Restrictions Weight Bearing Restrictions: No Pain:  Pt denies pain this afternoon but BLE noted with ongoing spasms  Therapy/Group: Individual Therapy  [name] 01/27/2023, 2:13 PM

## 2023-01-27 NOTE — Progress Notes (Signed)
Physical Therapy Weekly Progress Note  Patient Details  Name: Edward Waller MRN: [id number] Date of Birth: 01/21/1933  Beginning of progress report period: January 20, 2023 End of progress report period: January 27, 2023  Today's Date: 01/27/2023 PT Individual Time: 0800-0900 PT Individual Time Calculation (min): 60 min   Patient has met 2 of 3 short term goals.  Pt is making slow but steady progress toward LTGs. He is consistently using leg loops for bed mobility with cueing for LE management. Pt is progressing well with slideboard transfer with assist mostly required for sitting balance and hip clearance. Custom w/c eval scheduled for 10/8. Pt is propelling manual w/c with supervision up to 250 ft but is limited by UE endurance.   Patient continues to demonstrate the following deficits muscle weakness, decreased cardiorespiratoy endurance, abnormal tone, and decreased sitting balance, decreased balance strategies, and difficulty maintaining precautions and therefore will continue to benefit from skilled PT intervention to increase functional independence with mobility.  Patient progressing toward long term goals..  Continue plan of care.  PT Short Term Goals Week 3:  PT Short Term Goal 1 (Week 3): Pt will self propel w/c x 300 ft PT Short Term Goal 2 (Week 3): pt will be able to recall steps for SBT and direct therapist PT Short Term Goal 3 (Week 3): Pt will recall steps of bed mobility to direct care  Skilled Therapeutic Interventions/Progress Updates:  pt received in bed and agreeable to therapy. No complaint of pain.   Donned ted hose, shorts, leg loops, and shoes tot A for time.  Pt used leg loops for LE management with cueing and mod A trunk elevation.  Pt initially reports light headedness in sitting, BP 103/61 (74). Returned to supine with max A. After resting, pt returned to sitting in the same manner and demoed improved upright tolerance. Pt participated in max A slideboard  transfer with +2 standing by for safety. Pt then propelled w/c ~250 ft with supervision, demoing improved endurance before returning to room and remaining in w/c with needs in reach.   Therapy Documentation Precautions:  Precautions Precautions: Back Precaution Booklet Issued: No Precaution Comments: Abdominal binder and thigh High ted hose for BP management for OOB. Required Braces or Orthoses: Spinal Brace Spinal Brace: Thoracolumbosacral orthotic, Applied in sitting position Restrictions Weight Bearing Restrictions: No General:       Therapy/Group: Individual Therapy  [name] 01/27/2023, 12:47 PM

## 2023-01-27 NOTE — Progress Notes (Signed)
Nurse spoke to patient about in and out cath procedures. Patient stated that he told the doctor that he has "tried to perform his self once before and would rather not do it again". Nurse tried to educate patient about self cath and offered assistance and encouraging words. Patient was not receptive to trying and refused to participate in procedure. Nurse educated patient on what happens if he doesn't learn, no response from patient. Nursing was able to cath patient and verbalized steps for patient. Outcome documented in flowsheet.

## 2023-01-27 NOTE — Progress Notes (Signed)
Occupational Therapy Session Note  Patient Details  Name: Edward Waller MRN: [id number] Date of Birth: 1932/06/03  Today's Date: 01/27/2023 OT Individual Time: 0930-1040 OT Individual Time Calculation (min): 70 min    Short Term Goals: Week 3:  OT Short Term Goal 1 (Week 3): Pt will don pullover shirt with min A in unsupported sitting OT Short Term Goal 2 (Week 3): [name] transfers with mod A OT Short Term Goal 3 (Week 3): Pt will complete LB dressing tasks with max A at bed level  Skilled Therapeutic Interventions/Progress Updates:    Pt resting in w/c upon arrival. OT intervention with focus on SB transfers and unsupported sitting balance. SB transfers with max A +1 and max verbal cues for technique. Block practice unsupported sitting balance with only unilateral UE support. Pt attempted unsupported sitting balance without UE support but required max A for task. Sitting balance with unilateral UE support with CGA. Extended rest breaks throughout session. Pt returned to w/c and remained in w/c in room. All needs within reach.   Therapy Documentation Precautions:  Precautions Precautions: Back Precaution Booklet Issued: No Precaution Comments: Abdominal binder and thigh High ted hose for BP management for OOB. Required Braces or Orthoses: Spinal Brace Spinal Brace: Thoracolumbosacral orthotic, Applied in sitting position Restrictions Weight Bearing Restrictions: No   Pain:  Pt reports discomfort with ongoing LLE spasms; MD aware and meds admin prior to therapy   Therapy/Group: Individual Therapy  [name] 01/27/2023, 10:45 AM

## 2023-01-27 NOTE — Progress Notes (Signed)
IP Rehab Bowel Program Documentation   Bowel Program Start time 1900   Dig Stim Indicated? Yes  Dig Stim Prior to Suppository or mini Enema X 2   Output from dig stim: Moderate  Ordered intervention: Suppository Yes , mini enema No ,   Repeat dig stim after Suppository or Mini enema  X 2,  Output? Moderate   Bowel Program Complete? Yes , handoff given Pilar Grammes, RN  Patient Tolerated? Yes
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
## 2023-01-27 NOTE — Progress Notes (Signed)
Pt wife at bedside during self cath. Wife was receptive and performed in and out cath using clean technique. Wife verbalized understanding of procedure. Outcomes recorded in chart.

## 2023-01-28 MED ORDER — BUSPIRONE HCL 10 MG PO TABS
5.0000 mg | ORAL_TABLET | Freq: Three times a day (TID) | ORAL | Status: DC
  Administered 2023-01-28 – 2023-02-05 (×27): 5 mg via ORAL
  Filled 2023-01-28 (×28): qty 1

## 2023-01-28 NOTE — Progress Notes (Signed)
IP Rehab Bowel Program Documentation   Bowel Program Start time [phone] by Toni Amend, LPN  Dig Stim Indicated? Yes  Dig Stim Prior to Suppository or mini Enema X 2   Output from dig stim: Small  Ordered intervention: Suppository YES , mini enema No ,   Repeat dig stim after Suppository or Mini enema  X 2,  Output? Moderate   Bowel Program Complete? Yes , handoff given to Pilar Grammes, RN  Patient Tolerated? Yes

## 2023-01-28 NOTE — Progress Notes (Signed)
Physical Therapy Session Note  Patient Details  Name: Edward Waller MRN: [id number] Date of Birth: Apr 25, 1932  Today's Date: 01/28/2023 PT Individual Time: 1123-1224 PT Individual Time Calculation (min): 61 min   Short Term Goals: Week 3:  PT Short Term Goal 1 (Week 3): Pt will self propel w/c x 300 ft PT Short Term Goal 2 (Week 3): pt will be able to recall steps for SBT and direct therapist PT Short Term Goal 3 (Week 3): Pt will recall steps of bed mobility to direct care  Skilled Therapeutic Interventions/Progress Updates:    Pt seated in w/c on arrival and agreeable to therapy. Pt reports increased spasms today, L>R, with some causing unrated pain. Discussed medication management    Session focused on w/c eval with [facility]. Discussed options based on possible discharge plans. Pending discussion in family conference, likely to recommend TIS chair with manual push wheels and power tilt to allow for pressure relief and independence with locomotion. Full recommendations pending family meeting on Thursday.  Extended discussion about needs and options.   Pt then assisted back to bed with max x 2 slideboard transfer and to supine with max x 2 and handed off to NT at end of session.   Therapy Documentation Precautions:  Precautions Precautions: Back Precaution Booklet Issued: No Precaution Comments: Abdominal binder and thigh High ted hose for BP management for OOB. Required Braces or Orthoses: Spinal Brace Spinal Brace: Thoracolumbosacral orthotic, Applied in sitting position Restrictions Weight Bearing Restrictions: No General:      Therapy/Group: Individual Therapy  [name] 01/28/2023, 12:42 PM
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
## 2023-01-28 NOTE — Progress Notes (Signed)
Occupational Therapy Session Note  Patient Details  Name: Edward Waller MRN: [id number] Date of Birth: 14-Mar-1933  Today's Date: 01/28/2023 OT Individual Time: 1300-1340 OT Individual Time Calculation (min): 40 min    Short Term Goals: Week 3:  OT Short Term Goal 1 (Week 3): Pt will don pullover shirt with min A in unsupported sitting OT Short Term Goal 2 (Week 3): [name] transfers with mod A OT Short Term Goal 3 (Week 3): Pt will complete LB dressing tasks with max A at bed level  Skilled Therapeutic Interventions/Progress Updates:    Pt resting in bed upon arrival. OT intervention with focus on bed mobility for repositioning and BUE therex for UE strengthening. Min A for rolling R/L in bed using bed rails to facilitate removal of TLSO. BUE therex with yellow theraband with focus on biceps, triceps, lats, and scapula protraction/retraction. Circuit exercises 3x10. Pt remained in bed with all needs wihtin reach.   Therapy Documentation Precautions:  Precautions Precautions: Back Precaution Booklet Issued: No Precaution Comments: Abdominal binder and thigh High ted hose for BP management for OOB. Required Braces or Orthoses: Spinal Brace Spinal Brace: Thoracolumbosacral orthotic, Applied in sitting position Restrictions Weight Bearing Restrictions: No   Pain:  BLE spasms increasing frequency and duration; meds admin during session    Therapy/Group: Individual Therapy  [name] 01/28/2023, 1:52 PM

## 2023-01-28 NOTE — Progress Notes (Signed)
Occupational Therapy Session Note  Patient Details  Name: JAHDEN SCHARA MRN: [id number] Date of Birth: Sep 23, 1932  Today's Date: 01/28/2023 OT Individual Time: 4098-1191 OT Individual Time Calculation (min): 75 min    Short Term Goals: Week 3:  OT Short Term Goal 1 (Week 3): Pt will don pullover shirt with min A in unsupported sitting OT Short Term Goal 2 (Week 3): [name] transfers with mod A OT Short Term Goal 3 (Week 3): Pt will complete LB dressing tasks with max A at bed level  Skilled Therapeutic Interventions/Progress Updates:    Pt eating breakfast in bed upon arrival. OT intervention with focus on bed mobility, UB dressing, SB transfers, sitting balance EOB, and safety awareness to increase independence with BADLs and reduce burden of care. Rolling R/L in bed with min A using bed rails. Dependent for LB dressing and donning Ted Hose. Supine>sit EOB with max A. Sitting balance EOB with max A to don pullover shirt with max A. SB transfer with max A+2 for safety. Tot A for repositioning in w/c. Grooming at sink with setup. Pt remained in w/c with all needs within reach.   Therapy Documentation Precautions:  Precautions Precautions: Back Precaution Booklet Issued: No Precaution Comments: Abdominal binder and thigh High ted hose for BP management for OOB. Required Braces or Orthoses: Spinal Brace Spinal Brace: Thoracolumbosacral orthotic, Applied in sitting position Restrictions Weight Bearing Restrictions: No Pain:  Pt reports his BLE spasms are better this morning  Therapy/Group: Individual Therapy  [name] 01/28/2023, 8:34 AM

## 2023-01-28 NOTE — Progress Notes (Incomplete)
Patient ID: Edward Waller, male   DOB: 11-Apr-1933, 89 y.o.   MRN: [id number]  SW met with pt to provide updates from team conference, and discuss family meeting tomorrow at 10am. He reports he and his wife are considering SNF placement to allow for progress to be made. SW discussed with pt peer support program. Pt is amenable to additional supports. SW informed will send out SNF short term rehab referrals. SW explained SNF placement process, selecting a SNF, bed availability, and insurance approval required under [employer] (Plan- [employer] P[employer]). Pt aware SW will bring SNF list based on insurance. SW will discuss further in family meeting tomorrow.   SW had pt sign peer support consent form, and informed appropriate staff about referral.   SW sent out SNF referral. Existing [facility]#[id number] .  Cecile Sheerer, MSW, LCSWA Office: [phone] Cell: [phone] Fax: [phone]

## 2023-01-28 NOTE — Progress Notes (Signed)
Physical Therapy Session Note  Patient Details  Name: Edward Waller MRN: [id number] Date of Birth: 09/03/1932  Today's Date: 01/28/2023 PT Individual Time: 0919-1015 PT Individual Time Calculation (min): 56 min   Short Term Goals: Week 3:  PT Short Term Goal 1 (Week 3): Pt will self propel w/c x 300 ft PT Short Term Goal 2 (Week 3): pt will be able to recall steps for SBT and direct therapist PT Short Term Goal 3 (Week 3): Pt will recall steps of bed mobility to direct care  Skilled Therapeutic Interventions/Progress Updates:      Therapy Documentation Precautions:  Precautions Precautions: Back Precaution Booklet Issued: No Precaution Comments: Abdominal binder and thigh High ted hose for BP management for OOB. Required Braces or Orthoses: Spinal Brace Spinal Brace: Thoracolumbosacral orthotic, Applied in sitting position Restrictions Weight Bearing Restrictions: No  Pt agreeable to PT session with emphasis on transfer training and static seated balance. Pt supervision with w/c propulsion with appropriate stride length ~150 ft to dayroom. Pt mod A with slide board transfers throughout session with cues for forward flexion and use of momentum to increase gluteal clearance. Pt requires total A for board placement. Pt CGA-max A for static sitting balance ~5 minutes, requires intermittent rest breaks due to fatigue. Pt performed yoga block push ups 2 x 5 with tactile and verbal cue for forward flexion. Pt reports fatigue and mod A with return slideboard transfer to w/c. Pt encouraged to perform self-advocacy and to direct his care to decrease worry and discomfort with transfers and positioning with other staff members. Pt returned to room and left seated at bedside with all needs in reach. Pt with unrated low back pain, provided rest/repositioning during session.     Therapy/Group: Individual Therapy  Truitt Leep Truitt Leep PT, DPT  01/28/2023, 4:01 PM
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
## 2023-01-29 MED ORDER — PAROXETINE HCL 20 MG PO TABS
20.0000 mg | ORAL_TABLET | Freq: Every day | ORAL | Status: DC
  Administered 2023-01-29 – 2023-02-12 (×15): 20 mg via ORAL
  Filled 2023-01-29 (×15): qty 1

## 2023-01-29 NOTE — Progress Notes (Addendum)
This nurse performed digital stimulation 1845 with suppository. During that time patient had large incontinent urinary episode. When this nurse returned patient noted to void once more. Bladder scan performed with 182 ml in bladder. Once patient was scanned this nurse and PM nurse witnessed patient voiding once again. This nurse did not perform in and out cath at this time. PM nurse to assess and bladder scan. Patient had successful BM with bowel program.
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
## 2023-01-29 NOTE — Plan of Care (Signed)
  Problem: Consults Goal: RH SPINAL CORD INJURY PATIENT EDUCATION Description:  See Patient Education module for education specifics.  Outcome: Progressing   Problem: SCI BOWEL ELIMINATION Goal: RH STG SCI MANAGE BOWEL PROGRAM W/ASSIST OR AS APPROPRIATE Description: STG SCI Manage bowel program w/mod assist or as appropriate. Outcome: Progressing   Problem: SCI BLADDER ELIMINATION Goal: RH STG MANAGE BLADDER WITH MEDICATION WITH ASSISTANCE Description: STG Manage Bladder With Medication With min Assistance. Outcome: Progressing   Problem: RH SKIN INTEGRITY Goal: RH STG SKIN FREE OF INFECTION/BREAKDOWN Description: Skin will remain intact and free of breakdown with min assist  Outcome: Progressing Goal: RH STG MAINTAIN SKIN INTEGRITY WITH ASSISTANCE Description: STG Maintain Skin Integrity With min Assistance. Outcome: Progressing   Problem: RH SAFETY Goal: RH STG ADHERE TO SAFETY PRECAUTIONS W/ASSISTANCE/DEVICE Description: STG Adhere to Safety Precautions With min Assistance/Device. Outcome: Progressing Goal: RH STG DECREASED RISK OF FALL WITH ASSISTANCE Description: STG Decreased Risk of Fall With min Assistance. Outcome: Progressing   Problem: RH PAIN MANAGEMENT Goal: RH STG PAIN MANAGED AT OR BELOW PT'S PAIN GOAL Description: Pain will be managed less than 4 with PRN medications min assist  Outcome: Progressing   Problem: RH KNOWLEDGE DEFICIT SCI Goal: RH STG INCREASE KNOWLEDGE OF SELF CARE AFTER SCI Description: Patient/caregiver will be able to manage medications and bowel/bladder program independently from nursing education, nursing handouts, and other resources independently  Outcome: Progressing   Problem: Education: Goal: Knowledge of the prescribed therapeutic regimen will improve Outcome: Progressing Goal: Understanding of discharge needs will improve Outcome: Progressing Goal: Individualized Educational Video(s) Outcome: Progressing   Problem:  Activity: Goal: Ability to avoid complications of mobility impairment will improve Outcome: Progressing Goal: Ability to tolerate increased activity will improve Outcome: Progressing   Problem: Clinical Measurements: Goal: Postoperative complications will be avoided or minimized Outcome: Progressing   Problem: Pain Management: Goal: Pain level will decrease with appropriate interventions Outcome: Progressing   Problem: Skin Integrity: Goal: Will show signs of wound healing Outcome: Progressing   Problem: SCI BLADDER ELIMINATION Goal: RH STG SCI MANAGE BLADDER PROGRAM W/ASSISTANCE Description: Patient/caregiver will be able to manage bladder program or foley care at home min assist  Outcome: Not Progressing

## 2023-01-29 NOTE — Progress Notes (Signed)
IP Rehab Bowel Program Documentation   Bowel Program Start time [id number]  Dig Stim Indicated? Yes  Dig Stim Prior to Suppository or mini Enema X 1   Output from dig stim: Minimal  Ordered intervention: Suppository Yes , mini enema No ,   Repeat dig stim after Suppository or Mini enema  X 2,  Output? Moderate   Bowel Program Complete? Yes , handoff given YES  Patient Tolerated? Yes   Emnmanuel Raena Pau.............................RN

## 2023-01-29 NOTE — Progress Notes (Signed)
Spoke with patient about self catheterization. This nures coordinated for patient to perform self cath at 10:10 today. Patient states that his DC plan is to go home and that his wife is a retired Engineer, civil (consulting). She states she will be the one to cath him at home. Patient furthermore states that he is unable to perform self cath due to impaired fine motor skills with his fingers as well as his numbness. This nurse will speak to wife when she comes today.
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
## 2023-01-29 NOTE — Progress Notes (Signed)
Occupational Therapy Session Note  Patient Details  Name: Edward Waller MRN: [id number] Date of Birth: 01-01-1933  Today's Date: 01/29/2023 OT Individual Time: 1015-1130 OT Individual Time Calculation (min): 75 min    Short Term Goals: Week 3:  OT Short Term Goal 1 (Week 3): Pt will don pullover shirt with min A in unsupported sitting OT Short Term Goal 2 (Week 3): [name] transfers with mod A OT Short Term Goal 3 (Week 3): Pt will complete LB dressing tasks with max A at bed level  Skilled Therapeutic Interventions/Progress Updates:   Pt seen for skilled OT session with report from PT that previous session BP dropped. OT rec'd pt TIS level and worked at sink side for hair and skin care with emphasis on B hand coordination and dexterity as well as foster independence as pt tends to defer self care to his wife when she visits to complete. Pt self propelled TIS with adapted hand rims with B w/c gloves 75 ft x 2 intervals with brief rest. B UE SCIFit w/c level 3 min x 2 intervals forward and back. BP 116/68, HR 86 RR 18 post therex. OT transported pt outside on 1st level to address B UE theraputty with beige (light) for overall B hand strength due to disuse atrophy noted, overall CDP mngt and activity tolerance progression. Self propelled 45 ft then 55 ft back to unit and placed bedside in TIS with all needs and safety measures in reach.    Pain: denies pain   Therapy Documentation Precautions:  Precautions Precautions: Back Precaution Booklet Issued: No Precaution Comments: Abdominal binder and thigh High ted hose for BP management for OOB. Required Braces or Orthoses: Spinal Brace Spinal Brace: Thoracolumbosacral orthotic, Applied in sitting position Restrictions Weight Bearing Restrictions: No  Therapy/Group: Individual Therapy  Vicenta Dunning 01/29/2023, 12:13 PM

## 2023-01-29 NOTE — Progress Notes (Signed)
Physical Therapy Session Note  Patient Details  Name: Edward Waller MRN: [id number] Date of Birth: January 03, 1933  Today's Date: 01/29/2023 PT Individual Time: 0805-900  and [id number] PT Individual Time Calculation (min): 55 min and 72 min  Short Term Goals: Week 3:  PT Short Term Goal 1 (Week 3): Pt will self propel w/c x 300 ft PT Short Term Goal 2 (Week 3): pt will be able to recall steps for SBT and direct therapist PT Short Term Goal 3 (Week 3): Pt will recall steps of bed mobility to direct care  Skilled Therapeutic Interventions/Progress Updates: Pt presented in bed agreeable to therapy. Pt denies pain at rest. PTA donned TED hose, sorts, and leg loops total A. Pt then completed rolling L/R with modA to allow PTA to pull pants over hips. Pt then completed rolling to sidelying and PTA used leg loops to manage BLE off bed. Pt then completed supine to sit with heavy modA to sitting. PTA donned TLSO total A and provided pt truncal support with modA as pt worked on Research scientist (medical) on. Pt then completed Slide board transfer maxA to w/c. Pt then propelled to day room with supervision for global endurance. Pt then completed Slide board transfer to R with heavy modA. During seated rest break pt indicated feeling lightheaded. Pt was provided rest however increased with pt stating now seeing "stars": BP checked as noted 72/53 (60) HR 133. Pt was placed in reclined position supported by ball and after 2 min pt stating some symptoms subsiding but still feeling lightheaded. PTA then placed BLE on w/c for some elevation. After a few more minutes pt stated feeling a bit better with BP 100/58 (69) HR 73. Pt was then repositioned to sitting then completed transfer back to w/c with maxA (total A for Slide board placement). Pt transported back to room and w/c tilted to comfort. Pt left in w/c at end of session with call bell within reach, current needs met, and nsg notified of pt's disposition.   Tx2: Pt presented in  [place] w/c at start of session. Pt c/o mild neck pain intermittently throughout session. Pt propelled to day room with supervision and x 1 brief break to have pt adjust back to midline due to R lateral lean. In day room pt set up with Slide board total A and completed Slide board transfer to mat with modA and pt demonstrating significant improvement of initiation from this am. At mat was able to complete lateral scoot with modA and notable offloading of hips. Pt then worked on sitting balance with pt able to place hands on BLE and use leg loops for support. Pt was able to maintain with CGA unsupported. While resting (supported by ball behind pt), pt attempted to use leg loops to lift each leg. Pt with increased difficulty lifting LLE due to decreased hand strength. Leg loops were switched and pt was able to manage each LE a little easier. Pt also worked on reaching for [employer] with R hand, exchanging to L hand then placing on table tray on L. Task completed with x 5 horseshoes and was completed x 2 directions each. Pt then attempted very small range anterior leans x 4. Pt then set up with Slide board and completed transfer back to w/c with modA. Pt propelled back to room in same manner as prior and aligned with bed. Pt then completed Slide board transfer back to bed with maxA due to slight uphill. Pt was able to complete sit to  supine with max although pt was able to control trunk to bed and use bed rail to support trunk while PTA managed BLE with use of leg loops. Pt repositioned to comfort and left in bed at end of session with bed alarm on, call bell within reach and needs met.      Therapy Documentation Precautions:  Precautions Precautions: Back Precaution Booklet Issued: No Precaution Comments: Abdominal binder and thigh High ted hose for BP management for OOB. Required Braces or Orthoses: Spinal Brace Spinal Brace: Thoracolumbosacral orthotic, Applied in sitting position Restrictions Weight  Bearing Restrictions: No General:   Vital Signs: Therapy Vitals Temp: 99 F (37.2 C) Temp Source: Oral Pulse Rate: 78 Resp: 18 BP: 104/60 Patient Position (if appropriate): Lying Oxygen Therapy SpO2: 98 % O2 Device: Room Air Pain:   Mobility:   Locomotion :    Trunk/Postural Assessment :    Balance:   Exercises:   Other Treatments:      Therapy/Group: Individual Therapy  Siris Hoos 01/29/2023, 4:09 PM

## 2023-01-30 LAB — COMPREHENSIVE METABOLIC PANEL
ALT: 97 U/L — ABNORMAL HIGH (ref 0–44)
AST: 33 U/L (ref 15–41)
Albumin: 2.6 g/dL — ABNORMAL LOW (ref 3.5–5.0)
Alkaline Phosphatase: 107 U/L (ref 38–126)
Anion gap: 8 (ref 5–15)
BUN: 20 mg/dL (ref 8–23)
CO2: 28 mmol/L (ref 22–32)
Calcium: 9.2 mg/dL (ref 8.9–10.3)
Chloride: 99 mmol/L (ref 98–111)
Creatinine, Ser: 0.76 mg/dL (ref 0.61–1.24)
GFR, Estimated: >60 mL/min (ref >60)
Glucose, Bld: 155 mg/dL — ABNORMAL HIGH (ref 70–99)
Potassium: 4.9 mmol/L (ref 3.5–5.1)
Sodium: 135 mmol/L (ref 135–145)
Total Bilirubin: 0.4 mg/dL (ref 0.3–1.2)
Total Protein: 7.2 g/dL (ref 6.5–8.1)

## 2023-01-30 MED ORDER — LIDOCAINE 5 % EX PTCH
2.0000 | MEDICATED_PATCH | CUTANEOUS | Status: DC
  Administered 2023-01-30 – 2023-02-12 (×14): 2 via TRANSDERMAL
  Filled 2023-01-30 (×14): qty 2

## 2023-01-30 MED ORDER — ENSURE ENLIVE PO LIQD: 237.0000 mL | Freq: Three times a day (TID) | ORAL | Status: DC

## 2023-01-30 NOTE — Progress Notes (Signed)
Occupational Therapy Session Note  Patient Details  Name: KALLUM JORGENSEN MRN: [id number] Date of Birth: 1932-05-18  Today's Date: 01/30/2023 OT Individual Time: 4098-1191 OT Individual Time Calculation (min): 75 min    Short Term Goals: Week 3:  OT Short Term Goal 1 (Week 3): Pt will don pullover shirt with min A in unsupported sitting OT Short Term Goal 2 (Week 3): [name] transfers with mod A OT Short Term Goal 3 (Week 3): Pt will complete LB dressing tasks with max A at bed level  Skilled Therapeutic Interventions/Progress Updates:    Pt resting in bed upon arrival. OT intervention with focus on bed mobility to facilitate LB dressing, UB dressing at bed level, sitting balance EOB, and SB transfer to w/c. Rolling R/L in bed with min A using bed rails. Dependent for LB dressing. Pt able to pull into upright sitting in bed with use of bed rails but requires BUE use to maintain. Pt donned pullover shirt with min A this morning, requiring assistance to pull over trunk. Supine>sit EOB with max A. SB transfer with max A. Pt remained in w/c with all needs witin reach.  Therapy Documentation Precautions:  Precautions Precautions: Back Precaution Booklet Issued: No Precaution Comments: Abdominal binder and thigh High ted hose for BP management for OOB. Required Braces or Orthoses: Spinal Brace Spinal Brace: Thoracolumbosacral orthotic, Applied in sitting position Restrictions Weight Bearing Restrictions: No   Pain: Pt reports 3/10 neck pain; meds admin at beginning of session  Therapy/Group: Individual Therapy  [name] 01/30/2023, 9:05 AM

## 2023-01-30 NOTE — Progress Notes (Signed)
Occupational Therapy Weekly Progress Note  Patient Details  Name: Edward Waller MRN: [id number] Date of Birth: November 26, 1932  Beginning of progress report period: January 24, 2023 End of progress report period: January 30, 2023  Patient has met 0 of 3 short term goals.  Functional gains have been minimal since last week progress note. Pt is able to don shirt with min A bed level with back supported. LB dressing LTG discontinued. SB transfers with max A+2. Family conference to discuss current levels and functional gains. LTGs downgraded. Recommended +2 assistance at all times if pt to d/c home. Family to decide on d/c destination.   Patient continues to demonstrate the following deficits: muscle weakness, muscle joint tightness, and muscle paralysis, decreased cardiorespiratoy endurance, impaired timing and sequencing, abnormal tone, unbalanced muscle activation, decreased coordination, and decreased motor planning, and decreased sitting balance, decreased standing balance, decreased postural control, decreased balance strategies, and difficulty maintaining precautions and therefore will continue to benefit from skilled OT intervention to enhance overall performance with BADL, iADL, and Reduce care partner burden.  Patient progressing toward long term goals..  Continue plan of care.  OT Short Term Goals Week 3:  OT Short Term Goal 1 (Week 3): Pt will don pullover shirt with min A in unsupported sitting OT Short Term Goal 2 (Week 3): [name] transfers with mod A OT Short Term Goal 2 - Progress (Week 3): Discontinued (comment) OT Short Term Goal 3 (Week 3): Pt will complete LB dressing tasks with max A at bed level Week 4:  OT Short Term Goal 1 (Week 4): Pt will don pullover shirt with min A in unsupported sitting OT Short Term Goal 2 (Week 4): [name] transfers with mod A   [name] 01/30/2023, 3:03 PM
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
## 2023-01-30 NOTE — Progress Notes (Signed)
Physical Therapy Session Note  Patient Details  Name: Edward Waller MRN: [id number] Date of Birth: 15-Sep-1932  Today's Date: 01/30/2023 PT Individual Time: [phone] PT Individual Time Calculation (min): 58 min   Short Term Goals: Week 3:  PT Short Term Goal 1 (Week 3): Pt will self propel w/c x 300 ft PT Short Term Goal 2 (Week 3): pt will be able to recall steps for SBT and direct therapist PT Short Term Goal 3 (Week 3): Pt will recall steps of bed mobility to direct care  Skilled Therapeutic Interventions/Progress Updates: Pt presented in w/c agreeable to therapy. Pt c/o pain in neck which he contributes to doing a therapy session and not lifting head. Discussed with pt that he was cued to lift head and he has been consistently educated on lifting head. Pt does not recall any education in regards to working on keeping head in neutral. Session therefore focused on neck/shoulder/periscapular therex and stretching. Pt propelled to day room with supervision and cues to shift trunk into neutral position. In day room PTA placed hot pack x5 min prior to therex. Pt completed the following activities below. Pt required frequent multimodal cues for technique and to complete task as pt noted to more internally distracted on this date. Once completed pt propelled back to room and remained in TIS with call bell within reach and needs met.   Cx flexion/extension Cx rotation (WLP) Shoulder flexion to 90 with 2lb dowel Backwards shoulder circles Chest press with 2lb dowel Scapular retraction with 2lb dowel and yellow theraband Shoulder ER with yellow theraband D1 PNF with yellow theraband        Therapy Documentation Precautions:  Precautions Precautions: Back Precaution Booklet Issued: No Precaution Comments: Abdominal binder and thigh High ted hose for BP management for OOB. Required Braces or Orthoses: Spinal Brace Spinal Brace: Thoracolumbosacral orthotic, Applied in sitting  position Restrictions Weight Bearing Restrictions: No General:   Vital Signs:   Pain: Pain Assessment Pain Scale: 0-10 Pain Score: 3  Pain Location: Neck Pain Intervention(s): Medication (See eMAR) Mobility:   Locomotion :    Trunk/Postural Assessment :    Balance:   Exercises:   Other Treatments:      Therapy/Group: Individual Therapy  Cashe Gatt 01/30/2023, 11:23 AM

## 2023-01-30 NOTE — Progress Notes (Signed)
Occupational Therapy Session Note  Patient Details  Name: DWAYN MORAVEK MRN: [id number] Date of Birth: Mar 02, 1933  Today's Date: 01/30/2023 OT Co-Treatment Time: 1030-1045 OT Co-Treatment Time Calculation (min): 15 min Co-tx with PT for family conference. Total time 45 mins  Short Term Goals: Week 3:  OT Short Term Goal 1 (Week 3): Pt will don pullover shirt with min A in unsupported sitting OT Short Term Goal 2 (Week 3): [name] transfers with mod A OT Short Term Goal 3 (Week 3): Pt will complete LB dressing tasks with max A at bed level  Skilled Therapeutic Interventions/Progress Updates:    Family conference with pt, wife, daughter, and granddaughter. CSW and MD present. Reviewed current functional status. Recommended use of Hoyer if d/c home. Recommended +2 for BADLs at bed level. Pt remained in w/c with family and CSW present.  Therapy Documentation Precautions:  Precautions Precautions: Back Precaution Booklet Issued: No Precaution Comments: Abdominal binder and thigh High ted hose for BP management for OOB. Required Braces or Orthoses: Spinal Brace Spinal Brace: Thoracolumbosacral orthotic, Applied in sitting position Restrictions Weight Bearing Restrictions: No Pain: Pt denies pain this morning   Therapy/Group: Co-Treatment  [name] 01/30/2023, 12:11 PM

## 2023-01-30 NOTE — Progress Notes (Signed)
Physical Therapy Session Note  Patient Details  Name: Edward Waller MRN: [id number] Date of Birth: [birth year]  Today's Date: 01/30/2023 PT Individual Time: 1000-1030, 1330-1415 PT Individual Time Calculation (min): 30 min, 45 min   and Today's Date: 01/30/2023 PT Co-Treatment Time: 1030-1045 PT Co-Treatment Time Calculation (min): 15 min  Short Term Goals: Week 3:  PT Short Term Goal 1 (Week 3): Pt will self propel w/c x 300 ft PT Short Term Goal 2 (Week 3): pt will be able to recall steps for SBT and direct therapist PT Short Term Goal 3 (Week 3): Pt will recall steps of bed mobility to direct care  Skilled Therapeutic Interventions/Progress Updates:    Session 1: Pt seen prior to family conference for MMT for w/c LMN documentation. Pt participated in MMT and assisted with information for paperwork.  After this, family conference with MD, OT, CSW, and nsg staff. Therapist education focused on expected level of function and care requirements, as well as specialty w/c consult. Pt remained sitting in w/c at end of session and remained with staff present on therapist exit.  Session 2: Pt seated in w/c on arrival and agreeable to therapy. Pt with intermittent unrated pain from spasms, premedicated. Rest and positioning provided as needed. Much of session spent discussing d/c plan with pt's wife and daughter. Continued discussion of pt's expected care needs, level of care offered at a SNF, and family members who will be able to assist. They report they are leaning toward SNF/short term rehab, but are concerned about home after this. Continued education on SCI topics and expected return based on pt's condition.   Pt assisted back to bed with max slideboard transfer with +2 assisting with board stability. Max sit>supine and tot a to reposition in bed. Positioned in bed and provided education on using less pillows to decrease forward head posture in bed. Pt remained in bed with his wife present.    Therapy Documentation Precautions:  Precautions Precautions: Back Precaution Booklet Issued: No Precaution Comments: Abdominal binder and thigh High ted hose for BP management for OOB. Required Braces or Orthoses: Spinal Brace Spinal Brace: Thoracolumbosacral orthotic, Applied in sitting position Restrictions Weight Bearing Restrictions: No General:       Therapy/Group: Individual Therapy  [name] 01/30/2023, 2:45 PM

## 2023-01-30 NOTE — Progress Notes (Signed)
IP Rehab Bowel Program Documentation   Bowel Program Start time 1826   Dig Stim Indicated? Yes  Dig Stim Prior to Suppository or mini Enema X 3   Output from dig stim: Small  Ordered intervention: Suppository Yes , mini enema No ,   Repeat dig stim after Suppository or Mini enema  X 2,  Output? Small   Bowel Program Complete? Yes , handoff given YES  Patient Tolerated? Yes   [facility] ................RN

## 2023-01-31 ENCOUNTER — Inpatient Hospital Stay (HOSPITAL_COMMUNITY): Payer: No Typology Code available for payment source

## 2023-01-31 MED ORDER — TIZANIDINE HCL 4 MG PO TABS
2.0000 mg | ORAL_TABLET | Freq: Three times a day (TID) | ORAL | Status: DC
  Administered 2023-01-31 – 2023-02-02 (×9): 2 mg via ORAL
  Filled 2023-01-31 (×9): qty 1

## 2023-01-31 MED ORDER — SORBITOL 70 % SOLN
30.0000 mL | Freq: Once | Status: AC
  Administered 2023-01-31: 30 mL via ORAL
  Filled 2023-01-31: qty 30
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
## 2023-01-31 NOTE — Progress Notes (Signed)
Occupational Therapy Session Note  Patient Details  Name: Edward Waller MRN: [id number] Date of Birth: 11/12/1932  Today's Date: 01/31/2023 OT Individual Time: 1430-1500 OT Individual Time Calculation (min): 30 min    Short Term Goals: Week 4:  OT Short Term Goal 1 (Week 4): Pt will don pullover shirt with min A in unsupported sitting OT Short Term Goal 2 (Week 4): [name] transfers with mod A  Skilled Therapeutic Interventions/Progress Updates:   Pt seen for brief OT session at end of therapy schedule for the day. Pt given the encouragement to take the opportunity to self direct care as this clinician has not performed w/c to bed transfer with pt and had TT present for standby safety. Pt required overall min cues or given choices with every step of the transfer process using transfer board back to bed including w/c set up, gait belt use, TB placement, arm rest removal, trunk and hand placement etc. Pt actually was able to demonstrate improved attention to task with less extraneous commentary, carryover of previous therapy techniques and strategies and overall ownership of tasks. Demo max A overall x 1 and min a/CGA x 1 for safety. Increased trunk alignment noted in EOB sitting. OT had pt self direct final steps of care re: leg loop and TED hose doffing, positioning of trunk and pillow props and item set up. Pt left with all needs in reach, nurse call button and safety measures reinforced.   Pain: denies pain   Therapy Documentation Precautions:  Precautions Precautions: Back Precaution Booklet Issued: No Precaution Comments: Abdominal binder and thigh High ted hose for BP management for OOB. Required Braces or Orthoses: Spinal Brace Spinal Brace: Thoracolumbosacral orthotic, Applied in sitting position Restrictions Weight Bearing Restrictions: No  Therapy/Group: Individual Therapy  Vicenta Dunning 01/31/2023, 7:50 AM

## 2023-01-31 NOTE — Progress Notes (Signed)
Occupational Therapy Session Note  Patient Details  Name: BUTLER VEGH MRN: [id number] Date of Birth: [birth year]  Today's Date: 01/31/2023 OT Individual Time: 4098-1191 OT Individual Time Calculation (min): 72 min    Short Term Goals: Week 1:  OT Short Term Goal 1 (Week 1): Pt will recall presure release schedule with min verbal cues OT Short Term Goal 1 - Progress (Week 1): Progressing toward goal OT Short Term Goal 2 (Week 1): Pt will tolerated sitting up in wc for 30 minutes or greater OT Short Term Goal 2 - Progress (Week 1): Met OT Short Term Goal 3 (Week 1): Pt will maintain sitting balance with min A consistently OT Short Term Goal 3 - Progress (Week 1): Progressing toward goal Week 2:  OT Short Term Goal 1 (Week 2): Pt will recall presure release schedule with min verbal cues OT Short Term Goal 1 - Progress (Week 2): Met OT Short Term Goal 2 (Week 2): Pt will maintain sitting balance with min A consistently OT Short Term Goal 2 - Progress (Week 2): Met OT Short Term Goal 3 (Week 2): Pt will don pullover shirt with min A in unsupported sitting OT Short Term Goal 3 - Progress (Week 2): Progressing toward goal OT Short Term Goal 4 (Week 2): [name] transfers with mod A OT Short Term Goal 4 - Progress (Week 2): Progressing toward goal Week 3:  OT Short Term Goal 1 (Week 3): Pt will don pullover shirt with min A in unsupported sitting OT Short Term Goal 2 (Week 3): [name] transfers with mod A OT Short Term Goal 2 - Progress (Week 3): Discontinued (comment) OT Short Term Goal 3 (Week 3): Pt will complete LB dressing tasks with max A at bed level  Skilled Therapeutic Interventions/Progress Updates:    1:1 Pt received in the bed. Self care retraining at shower level - BP taken in sitting and didn't demonstrate orthostatic. Focus on bed mobility of rolling for placing the lift pad; lifted to TIS shower chair with hoyer lift. Pt able to wash UB with etup and Lb with assist. Lifted back  to the bed and TEDS, brief and leg loops donned and pants threaded. Focus on using leg loops to pull up legs to assist self in rolling and then pull up pants over buttocks- able to do it with min A both ways with cues for instruction. Pt came to eOB with max A and sat EOB to don shirt with max A with min A for sitting balance. Pt returned to supine for xray to take images. Left resting in bed.   Therapy Documentation Precautions:  Precautions Precautions: Back Precaution Booklet Issued: No Precaution Comments: Abdominal binder and thigh High ted hose for BP management for OOB. Required Braces or Orthoses: Spinal Brace Spinal Brace: Thoracolumbosacral orthotic, Applied in sitting position Restrictions Weight Bearing Restrictions: No  Pain:  No c/o pain    Therapy/Group: Individual Therapy  Roney Mans [facility] 01/31/2023, 3:50 PM

## 2023-01-31 NOTE — Progress Notes (Signed)
IP Rehab Bowel Program Documentation   Bowel Program Start time [phone]   Dig Stim Indicated? Yes  Dig Stim Prior to Suppository or mini Enema X 3   Output from dig stim: Small  Ordered intervention: Suppository Yes , mini enema No ,   Repeat dig stim after Suppository or Mini enema  X 5,  Output? Very large   Bowel Program Complete? Yes , handoff given 2300  Patient Tolerated? Yes

## 2023-01-31 NOTE — Progress Notes (Signed)
Physical Therapy Session Note  Patient Details  Name: Edward Waller MRN: [id number] Date of Birth: [birth year]  Today's Date: 01/31/2023 PT Individual Time: 1100-1200 PT Individual Time Calculation (min): 60 min   Short Term Goals: Week 3:  PT Short Term Goal 1 (Week 3): Pt will self propel w/c x 300 ft PT Short Term Goal 2 (Week 3): pt will be able to recall steps for SBT and direct therapist PT Short Term Goal 3 (Week 3): Pt will recall steps of bed mobility to direct care  Skilled Therapeutic Interventions/Progress Updates: Pt presented in bed agreeable to thearpy. Pt c/o BLE pain due to increased spasticity, per pt MD to increase anti-spasticity meds. Educated pt that may take a day or two for it to take effect with pt verbalizing understanding. Pt noted to be dressed and therefore completed supine to sit with maxA as pt was able to assist with roll to sidelying and performed small push with BUE to elevate trunk. PTA donned TLSO and set up pt for Slide board transfer. Completed Slide board transfer with maxA (+2 present for safety). PTA noted some initiation with use BUE on this date. Once in w/c pt propelled to day room for general conditioning. Pt able to propel to standing frame with remainder of session focused on use of standing frame for weight bearing through BLE and possible spasticity management. BP checked prior to set up 117/66 (82) HR 58. On first bout pt able to stand ~3 min with therapist and rehab tech on each side of pt to guard trunk. Pt with no s/s OH and BP checked after 3 min 123/65 (81) HR 53. After extended seated rest break due to increased cx pain pt was able to stand for an additional 4 min then a final 3 min. Pt's affect appeared to lighten as well. Once completed pt transported back to room at end of session and remained in w/c as approaching lunch. Pt left with call bell within reach and current needs met.      Therapy Documentation Precautions:   Precautions Precautions: Back Precaution Booklet Issued: No Precaution Comments: Abdominal binder and thigh High ted hose for BP management for OOB. Required Braces or Orthoses: Spinal Brace Spinal Brace: Thoracolumbosacral orthotic, Applied in sitting position Restrictions Weight Bearing Restrictions: No General:   Vital Signs: Therapy Vitals Temp: 97.8 F (36.6 C) Temp Source: Oral Pulse Rate: 68 Resp: 17 BP: 119/72 Patient Position (if appropriate): Sitting Oxygen Therapy SpO2: 90 % O2 Device: Room Air  Therapy/Group: Individual Therapy  Clarion Mooneyhan 01/31/2023, 4:15 PM

## 2023-01-31 NOTE — Progress Notes (Signed)
Patient ID: Edward Waller, male   DOB: 10/04/1932, 89 y.o.   MRN: [id number]   Patient/Family Conference  Patient/family in attendance: wife [name], dtr- [name] and granddaughter- Environmental health practitioner in attendance: Dr. Berline Chough (attending), Elijah Birk (OT, Zollie Scale (PT), Cecile Sheerer (SW)  Main focus: Discuss discharge plan  Synopsis of information shared: Attending reviewed current physical state, abilities and spasticity. Therapists reviewed current gains, however, pt remains max Asst +2 with care needs, transfers, bathing, bed mobility, dressing etc. Also discussed loaner w/c at discharge if patient discharges to home.  Pt has made gains with sitting balance with only hand support. SW provided SNF list based on insurance if SNF placement is discharge plan.   Barriers/concerns expressed by patient and family: limited supports, and will need to establish private aide care if pt goes home. Family remains undecided about SNF at this time.   Patient/family response: Family understands limitations. Family will assist with transportation to medical appointments, and will purchase Zenaida Niece if needed for speciality w/c.   Follow-up/action plans: SW will follow-up with pt wife on Monday to discuss discharge plan.    Cecile Sheerer, MSW, LCSWA Office: [phone] Cell: [phone] Fax: [phone]

## 2023-02-01 DIAGNOSIS — R0789 Other chest pain: Secondary | ICD-10-CM

## 2023-02-01 DIAGNOSIS — F419 Anxiety disorder, unspecified: Secondary | ICD-10-CM

## 2023-02-01 DIAGNOSIS — S22069S Unspecified fracture of T7-T8 vertebra, sequela: Secondary | ICD-10-CM

## 2023-02-01 MED ORDER — BACLOFEN 5 MG HALF TABLET
15.0000 mg | ORAL_TABLET | Freq: Every day | ORAL | Status: DC
  Administered 2023-02-01 – 2023-02-03 (×3): 15 mg via ORAL
  Filled 2023-02-01 (×3): qty 1

## 2023-02-01 MED ORDER — BACLOFEN 10 MG PO TABS
10.0000 mg | ORAL_TABLET | Freq: Two times a day (BID) | ORAL | Status: DC
  Administered 2023-02-02 – 2023-02-03 (×4): 10 mg via ORAL
  Filled 2023-02-01 (×4): qty 1
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
## 2023-02-01 NOTE — Progress Notes (Signed)
IP Rehab Bowel Program Documentation   Bowel Program Start time [phone]  Dig Stim Indicated? Yes  Dig Stim Prior to Suppository or mini Enema X 2   Output from dig stim: Smear  Ordered intervention: Suppository Yes , mini enema Yes ,   Repeat dig stim after Suppository or Mini enema  X 0,  Output? Small   Bowel Program Complete? Yes , handoff given 1910  Patient Tolerated? Yes

## 2023-02-01 NOTE — Progress Notes (Signed)
On call Provider contacted. Patient have intermittent episodes of spacticity with lower extremities. Around 0600 endorsed a "spasm" in his upper chest area. Place right hand over left chest and grimacing observed. Pain does not radiate from location of pain upper left chest area 2nd to 3rd intercostal space. Explains the pain to be a spasm and squeezing sensation. 0800 tZANdin 2 mg given earlier per Provider to assist in relieving current episodes of spacticity having. As well as EKG obtained. Continue to update accordingly.

## 2023-02-02 ENCOUNTER — Inpatient Hospital Stay (HOSPITAL_COMMUNITY): Payer: No Typology Code available for payment source

## 2023-02-02 LAB — CBC WITH DIFFERENTIAL/PLATELET
Abs Immature Granulocytes: 0.05 10*3/uL (ref 0.00–0.07)
Basophils Absolute: 0 10*3/uL (ref 0.0–0.1)
Basophils Relative: 0 %
Eosinophils Absolute: 0.1 10*3/uL (ref 0.0–0.5)
Eosinophils Relative: 1 %
HCT: 41.7 % (ref 39.0–52.0)
Hemoglobin: 13.7 g/dL (ref 13.0–17.0)
Immature Granulocytes: 1 %
Lymphocytes Relative: 10 %
Lymphs Abs: 0.9 10*3/uL (ref 0.7–4.0)
MCH: 31.3 pg (ref 26.0–34.0)
MCHC: 32.9 g/dL (ref 30.0–36.0)
MCV: 95.2 fL (ref 80.0–100.0)
Monocytes Absolute: 0.4 10*3/uL (ref 0.1–1.0)
Monocytes Relative: 5 %
Neutro Abs: 7.6 10*3/uL (ref 1.7–7.7)
Neutrophils Relative %: 83 %
Platelets: 384 10*3/uL (ref 150–400)
RBC: 4.38 MIL/uL (ref 4.22–5.81)
RDW: 13.2 % (ref 11.5–15.5)
WBC: 9.2 10*3/uL (ref 4.0–10.5)
nRBC: 0 % (ref 0.0–0.2)

## 2023-02-02 LAB — BASIC METABOLIC PANEL
Anion gap: 10 (ref 5–15)
BUN: 24 mg/dL — ABNORMAL HIGH (ref 8–23)
CO2: 25 mmol/L (ref 22–32)
Calcium: 9 mg/dL (ref 8.9–10.3)
Chloride: 98 mmol/L (ref 98–111)
Creatinine, Ser: 0.72 mg/dL (ref 0.61–1.24)
GFR, Estimated: >60 mL/min (ref >60)
Glucose, Bld: 141 mg/dL — ABNORMAL HIGH (ref 70–99)
Potassium: 4.4 mmol/L (ref 3.5–5.1)
Sodium: 133 mmol/L — ABNORMAL LOW (ref 135–145)

## 2023-02-02 MED ORDER — SENNOSIDES-DOCUSATE SODIUM 8.6-50 MG PO TABS
2.0000 | ORAL_TABLET | Freq: Two times a day (BID) | ORAL | Status: DC
  Administered 2023-02-03: 2 via ORAL
  Filled 2023-02-02: qty 2

## 2023-02-02 NOTE — Plan of Care (Signed)
  Problem: Consults Goal: RH SPINAL CORD INJURY PATIENT EDUCATION Description:  See Patient Education module for education specifics.  Outcome: Progressing   Problem: SCI BOWEL ELIMINATION Goal: RH STG SCI MANAGE BOWEL PROGRAM W/ASSIST OR AS APPROPRIATE Description: STG SCI Manage bowel program w/mod assist or as appropriate. Outcome: Progressing   Problem: SCI BLADDER ELIMINATION Goal: RH STG MANAGE BLADDER WITH MEDICATION WITH ASSISTANCE Description: STG Manage Bladder With Medication With min Assistance. Outcome: Progressing Goal: RH STG SCI MANAGE BLADDER PROGRAM W/ASSISTANCE Description: Patient/caregiver will be able to manage bladder program or foley care at home min assist  Outcome: Progressing   Problem: RH SKIN INTEGRITY Goal: RH STG MAINTAIN SKIN INTEGRITY WITH ASSISTANCE Description: STG Maintain Skin Integrity With min Assistance. Outcome: Progressing   Problem: RH PAIN MANAGEMENT Goal: RH STG PAIN MANAGED AT OR BELOW PT'S PAIN GOAL Description: Pain will be managed less than 4 with PRN medications min assist  Outcome: Progressing   Problem: RH KNOWLEDGE DEFICIT SCI Goal: RH STG INCREASE KNOWLEDGE OF SELF CARE AFTER SCI Description: Patient/caregiver will be able to manage medications and bowel/bladder program independently from nursing education, nursing handouts, and other resources independently  Outcome: Progressing   Problem: Education: Goal: Knowledge of the prescribed therapeutic regimen will improve Outcome: Progressing Goal: Understanding of discharge needs will improve Outcome: Progressing Goal: Individualized Educational Video(s) Outcome: Progressing   Problem: Activity: Goal: Ability to avoid complications of mobility impairment will improve Outcome: Progressing Goal: Ability to tolerate increased activity will improve Outcome: Progressing   Problem: Clinical Measurements: Goal: Postoperative complications will be avoided or minimized Outcome:  Progressing   Problem: Pain Management: Goal: Pain level will decrease with appropriate interventions Outcome: Progressing   Problem: Skin Integrity: Goal: Will show signs of wound healing Outcome: Progressing

## 2023-02-02 NOTE — Progress Notes (Addendum)
In room prior to 0200 before intermittent urine  catheter observed patient with clammy skin and diaphoretic. Periorbital edema observed with left eye with drainage. Myoclonus movements are less, but continues to have them through the evening and at night. No movement observed with jaw or neck but does continue to endorse neck pain on the left side. Denies any additional symptoms at this time. Denies any pain. Calm and pleasant during interaction.

## 2023-02-02 NOTE — Progress Notes (Signed)
IP Rehab Bowel Program Documentation   Bowel Program Start time [phone]  Dig Stim Indicated? Yes  Dig Stim Prior to Suppository or mini Enema X 2   Output from dig stim: None  Ordered intervention: Suppository Yes , mini enema No ,   Repeat dig stim after Suppository or Mini enema  X 2,  Output? Moderate   Bowel Program Complete? Yes , handoff given yes  Patient Tolerated? Yes   Dominica Severin ......................................rn

## 2023-02-02 NOTE — Plan of Care (Signed)
  Problem: SCI BOWEL ELIMINATION Goal: RH STG SCI MANAGE BOWEL PROGRAM W/ASSIST OR AS APPROPRIATE Description: STG SCI Manage bowel program w/mod assist or as appropriate. Outcome: Progressing   Problem: SCI BLADDER ELIMINATION Goal: RH STG MANAGE BLADDER WITH MEDICATION WITH ASSISTANCE Description: STG Manage Bladder With Medication With min Assistance. Outcome: Progressing Goal: RH STG SCI MANAGE BLADDER PROGRAM W/ASSISTANCE Description: Patient/caregiver will be able to manage bladder program or foley care at home min assist  Outcome: Progressing   Problem: RH SKIN INTEGRITY Goal: RH STG MAINTAIN SKIN INTEGRITY WITH ASSISTANCE Description: STG Maintain Skin Integrity With min Assistance. Outcome: Progressing   Problem: RH PAIN MANAGEMENT Goal: RH STG PAIN MANAGED AT OR BELOW PT'S PAIN GOAL Description: Pain will be managed less than 4 with PRN medications min assist  Outcome: Progressing   Problem: RH KNOWLEDGE DEFICIT SCI Goal: RH STG INCREASE KNOWLEDGE OF SELF CARE AFTER SCI Description: Patient/caregiver will be able to manage medications and bowel/bladder program independently from nursing education, nursing handouts, and other resources independently  Outcome: Progressing   Problem: Skin Integrity: Goal: Will show signs of wound healing Outcome: Progressing
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
## 2023-02-02 NOTE — Progress Notes (Signed)
Physical Therapy Session Note  Patient Details  Name: AVYAAN SUMMER MRN: [id number] Date of Birth: 1933/02/24  Today's Date: 02/02/2023 PT Individual Time: 4132-4401 and 1350-1430 PT Individual Time Calculation (min): 60 min 40 min  Short Term Goals: Week 3:  PT Short Term Goal 1 (Week 3): Pt will self propel w/c x 300 ft PT Short Term Goal 2 (Week 3): pt will be able to recall steps for SBT and direct therapist PT Short Term Goal 3 (Week 3): Pt will recall steps of bed mobility to direct care  Skilled Therapeutic Interventions/Progress Updates: Tx1: Pt presented in bed sleeping but easily aroused and agreeable to therapy. Pt stated unrated pain in neck, lidocaine patch in place. PTA donned TED hose and threaded pants total A. Completed rolling L/R with heavy modA to allow PTA to pull pants over hips. Leg loops were then donned total A. Pt completed supine to sit with maxA and TLSO was donned total A. Pt completed Slide board transfer maxA with +2 present for safety. Pt transported to day room and had pt work on directing care in regards to Slide board placement and positioning of person assisting with transfer. Pt continues to require reinforcement of head hips relationship as well as initiation of scoot. While on mat pt performed triceps extension x 5 and use of BUE to "pull" self up to neutral for slightly posterior lean. Pt then completed transfer back to w/c in same manner as prior and pt propelled back to room with supervision. Pt remained in w/c at end of session and left with call bell with reach and needs met.   Tx2: Pt presented in TIS with wife present agreeable to therapy. Pt states unrated neck pain. Pt propelled to day room and set up with standing frame. BP checked prior to standing 124/64 (81) HR 77. Pt with no s/s of OH during standing trials. Pt participated in x 2 bouts of standing for 4 min each to allow weight bearing through BLE for spasticity management. While in standing  frame worked on pt maintaining head up vs looking down. Pt pleased looking out window and changing leaves. Pt did require extended rest break between standing bouts due to neck fatigue. Once completed pt propelled back to room in same manner as prior. In room pt required minA to align with bed and required total A for Slide board management. Pt was maxA for Slide board transfer to bed. Pt required maxA for sit to supine. Pt positioned to comfort and left in bed with call bell within reach current needs met, and wife present.      Therapy Documentation Precautions:  Precautions Precautions: Back Precaution Booklet Issued: No Precaution Comments: Abdominal binder and thigh High ted hose for BP management for OOB. Required Braces or Orthoses: Spinal Brace Spinal Brace: Thoracolumbosacral orthotic, Applied in sitting position Restrictions Weight Bearing Restrictions: No General:   Vital Signs: Therapy Vitals Temp: 97.6 F (36.4 C) Pulse Rate: 73 Resp: 18 BP: (!) 109/50 Patient Position (if appropriate): Sitting Oxygen Therapy SpO2: 98 % O2 Device: Room Air Pain:   Mobility:   Locomotion :    Trunk/Postural Assessment :    Balance:   Exercises:   Other Treatments:      Therapy/Group: Individual Therapy  Kelbi Renstrom 02/02/2023, 4:18 PM

## 2023-02-02 NOTE — Progress Notes (Signed)
Occupational Therapy Session Note  Patient Details  Name: Edward Waller MRN: [id number] Date of Birth: 15-Oct-1932  {CHL IP REHAB OT TIME CALCULATIONS:[id number]}   Short Term Goals: Week 4:  OT Short Term Goal 1 (Week 4): Pt will don pullover shirt with min A in unsupported sitting OT Short Term Goal 2 (Week 4): [name] transfers with mod A  Skilled Therapeutic Interventions/Progress Updates:     Patient agreeable to participate in OT session. Reports *** pain level.   Patient participated in skilled OT session focusing on ***. Therapist facilitated/assessed/developed/educated/integrated/elicited *** in order to improve/facilitate/promote   Therapy Documentation Precautions:  Precautions Precautions: Back Precaution Booklet Issued: No Precaution Comments: Abdominal binder and thigh High ted hose for BP management for OOB. Required Braces or Orthoses: Spinal Brace Spinal Brace: Thoracolumbosacral orthotic, Applied in sitting position Restrictions Weight Bearing Restrictions: No   Therapy/Group: Individual Therapy  Limmie Patricia, OTR/L,CBIS  Supplemental OT - [facility] and [facility] Secure Chat Preferred   02/02/2023, 9:34 PM

## 2023-02-03 LAB — CBC WITH DIFFERENTIAL/PLATELET
Abs Immature Granulocytes: 0.02 10*3/uL (ref 0.00–0.07)
Basophils Absolute: 0.1 10*3/uL (ref 0.0–0.1)
Basophils Relative: 1 %
Eosinophils Absolute: 0.1 10*3/uL (ref 0.0–0.5)
Eosinophils Relative: 2 %
HCT: 42 % (ref 39.0–52.0)
Hemoglobin: 13.9 g/dL (ref 13.0–17.0)
Immature Granulocytes: 0 %
Lymphocytes Relative: 16 %
Lymphs Abs: 1.1 10*3/uL (ref 0.7–4.0)
MCH: 31.4 pg (ref 26.0–34.0)
MCHC: 33.1 g/dL (ref 30.0–36.0)
MCV: 94.8 fL (ref 80.0–100.0)
Monocytes Absolute: 0.5 10*3/uL (ref 0.1–1.0)
Monocytes Relative: 7 %
Neutro Abs: 5.2 10*3/uL (ref 1.7–7.7)
Neutrophils Relative %: 74 %
Platelets: 337 10*3/uL (ref 150–400)
RBC: 4.43 MIL/uL (ref 4.22–5.81)
RDW: 13.2 % (ref 11.5–15.5)
WBC: 7 10*3/uL (ref 4.0–10.5)
nRBC: 0 % (ref 0.0–0.2)

## 2023-02-03 LAB — COMPREHENSIVE METABOLIC PANEL
ALT: 66 U/L — ABNORMAL HIGH (ref 0–44)
AST: 35 U/L (ref 15–41)
Albumin: 2.7 g/dL — ABNORMAL LOW (ref 3.5–5.0)
Alkaline Phosphatase: 96 U/L (ref 38–126)
Anion gap: 6 (ref 5–15)
BUN: 21 mg/dL (ref 8–23)
CO2: 25 mmol/L (ref 22–32)
Calcium: 9 mg/dL (ref 8.9–10.3)
Chloride: 102 mmol/L (ref 98–111)
Creatinine, Ser: 0.84 mg/dL (ref 0.61–1.24)
GFR, Estimated: >60 mL/min (ref >60)
Glucose, Bld: 131 mg/dL — ABNORMAL HIGH (ref 70–99)
Potassium: 4.7 mmol/L (ref 3.5–5.1)
Sodium: 133 mmol/L — ABNORMAL LOW (ref 135–145)
Total Bilirubin: 0.5 mg/dL (ref 0.3–1.2)
Total Protein: 7.3 g/dL (ref 6.5–8.1)

## 2023-02-03 MED ORDER — TIZANIDINE HCL 4 MG PO TABS
2.0000 mg | ORAL_TABLET | Freq: Two times a day (BID) | ORAL | Status: DC
  Administered 2023-02-03 – 2023-02-11 (×17): 2 mg via ORAL
  Filled 2023-02-03 (×18): qty 1

## 2023-02-03 MED ORDER — TIZANIDINE HCL 4 MG PO TABS
4.0000 mg | ORAL_TABLET | Freq: Every day | ORAL | Status: DC
  Administered 2023-02-03 – 2023-02-11 (×9): 4 mg via ORAL
  Filled 2023-02-03 (×9): qty 1

## 2023-02-03 MED ORDER — SENNOSIDES-DOCUSATE SODIUM 8.6-50 MG PO TABS
2.0000 | ORAL_TABLET | Freq: Every day | ORAL | Status: DC
  Administered 2023-02-04 – 2023-02-11 (×8): 2 via ORAL
  Filled 2023-02-03 (×8): qty 2

## 2023-02-03 MED ORDER — MIRABEGRON ER 50 MG PO TB24
50.0000 mg | ORAL_TABLET | Freq: Every day | ORAL | Status: DC
  Administered 2023-02-04 – 2023-02-13 (×10): 50 mg via ORAL
  Filled 2023-02-03 (×10): qty 1

## 2023-02-03 MED ORDER — POLYETHYLENE GLYCOL 3350 17 G PO PACK
17.0000 g | PACK | Freq: Every day | ORAL | Status: DC
  Administered 2023-02-03 – 2023-02-10 (×8): 17 g via ORAL
  Filled 2023-02-03 (×9): qty 1

## 2023-02-03 NOTE — Progress Notes (Signed)
Physical Therapy Session Note  Patient Details  Name: [name] MRN: [id number] Date of Birth: [birth year]  Today's Date: 02/03/2023 PT Individual Time: 1510-1535 PT Individual Time Calculation (min): 25 min   Short Term Goals: Week 4:  PT Short Term Goal 1 (Week 4): =LTGs d/t ELOS  Skilled Therapeutic Interventions/Progress Updates:  pt received in bed directly after cathing and agreeable to therapy. No complaint of pain. Supine>sit with max a for trunk elevation, mod A for LE with leg loops. Session focused on sitting balance with BUE and UUE support. Note pt tends to collapse toward R side requiring frequent cueing to correct. May benefit from use of mirror for visual feed back. Pt returned to bed after session with max x 2. Remained in bed with all needs in reach   Therapy Documentation Precautions:  Precautions Precautions: Back Precaution Booklet Issued: No Precaution Comments: Abdominal binder and thigh High ted hose for BP management for OOB. Required Braces or Orthoses: Spinal Brace Spinal Brace: Thoracolumbosacral orthotic, Applied in sitting position Restrictions Weight Bearing Restrictions: No General:      Therapy/Group: Individual Therapy  [name] 02/03/2023, 3:46 PM
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
## 2023-02-03 NOTE — Progress Notes (Signed)
IP Rehab Bowel Program Documentation   Bowel Program Start time 1820  Dig Stim Indicated? Yes  Dig Stim Prior to Suppository or mini Enema X 1   Output from dig stim: None  Ordered intervention: Suppository Yes , mini enema No ,   Repeat dig stim after Suppository or Mini enema  X 3  Output? Moderate   Bowel Program Complete? Yes , handoff given YES  Patient Tolerated? Yes   Dominica Severin   RN

## 2023-02-03 NOTE — Progress Notes (Signed)
Occupational Therapy Session Note  Patient Details  Name: Edward Waller MRN: [id number] Date of Birth: 11-11-1932  Today's Date: 02/03/2023 OT Individual Time: 1110-1200 OT Individual Time Calculation (min): 50 min  and Today's Date: 02/03/2023 OT Missed Time: 10 Minutes Missed Time Reason: Other (comment) (Missed minutes due to delay in care.)   Short Term Goals: Week 4:  OT Short Term Goal 1 (Week 4): Pt will don pullover shirt with min A in unsupported sitting OT Short Term Goal 2 (Week 4): [name] transfers with mod A  Skilled Therapeutic Interventions/Progress Updates:  Pt received sitting in [facility] for skilled OT session with focus on [facility]. Pt agreeable to interventions, demonstrating overall pleasant mood. Pt with no reports of pain. OT offering intermediate rest breaks and positioning suggestions throughout session to address potential pain/fatigue and maximize participation/safety in session.   Pt self propels WC from room>day room with Mod I. In day room, pt instructed in series of [facility] activities for translation into grooming activities, detail below:  Large-sized peg board; pt replicates simple design with no instance of dropping Small-sized peg board; pt requires increased time to manage pegs and complete simple design, demos slight frustration but able to complete, reports increased difficulty with self-feeding in part due to positioning Translation/flipping of cards; pt requires cuing to assure he only grasps one card at a time,  Picking up/Translation/Stacking on coin-sized checker pieces; pt grasps up to 5 checkers with R hand, 2-3 checks with L hand. Pt unable to hold onto checkers with L-hand to fully translate towards fingertips, observed to pick up/stack individually despite cuing.   Pt remained sitting in WC with NT present, all immediate needs met at end of session. Pt continues to be appropriate for skilled OT intervention to promote further functional independence.    Therapy Documentation Precautions:  Precautions Precautions: Back Precaution Booklet Issued: No Precaution Comments: Abdominal binder and thigh High ted hose for BP management for OOB. Required Braces or Orthoses: Spinal Brace Spinal Brace: Thoracolumbosacral orthotic, Applied in sitting position Restrictions Weight Bearing Restrictions: No   Therapy/Group: Individual Therapy  Lou Cal, OTR/L, MSOT  02/03/2023, 11:19 AM

## 2023-02-03 NOTE — Progress Notes (Signed)
Physical Therapy Weekly Progress Note  Patient Details  Name: Edward Waller MRN: [id number] Date of Birth: 04/27/1932  Beginning of progress report period: January 27, 2023 End of progress report period: February 03, 2023  Today's Date: 02/03/2023 PT Individual Time: 0921-1001 PT Individual Time Calculation (min): 40 min   Patient has met 3 of 3 short term goals.  Pt is making slow but steady progress. Improving initiation with bed mobility and transfers. He is able to direct care but has some difficulty articulating care direction appropriately.Plan to d/c to SNF, so pt's family has been provided with contact info for [employer], [employer] who assisted with w/c eval for custom chair to meet pt needs at home.   Patient continues to demonstrate the following deficits muscle joint tightness and muscle paralysis, impaired timing and sequencing, abnormal tone, and unbalanced muscle activation, and decreased sitting balance, decreased postural control, decreased balance strategies, and difficulty maintaining precautions and therefore will continue to benefit from skilled PT intervention to increase functional independence with mobility.  Patient progressing toward long term goals..  Plan of care revisions: goals downgraded/discharged to better reflect expected level at discharge.  PT Short Term Goals Week 3:  PT Short Term Goal 1 (Week 3): Pt will self propel w/c x 300 ft PT Short Term Goal 1 - Progress (Week 3): Met PT Short Term Goal 2 (Week 3): pt will be able to recall steps for SBT and direct therapist PT Short Term Goal 2 - Progress (Week 3): Met PT Short Term Goal 3 (Week 3): Pt will recall steps of bed mobility to direct care PT Short Term Goal 3 - Progress (Week 3): Met Week 4:  PT Short Term Goal 1 (Week 4): =LTGs d/t ELOS  Skilled Therapeutic Interventions/Progress Updates:    pt received in bed and agreeable to therapy. No complaint of pain.   Donned ted hose, shorts, leg loops, and  shoes tot A for time. Pt then directed to instruct therapist on slideboard transfer. Pt was able to articulate steps with increased time. Pt was able to initiate some scoots with as little as mod A, but required max for some scoots d/t poor set up.   Pt propelled w/c with supervision, and therapist adjusted head rest. Educated pt on needing to minimize forward head posture and assisted with upper thoracic stretching. Pt remained in chair, was left with all needs in reach and alarm active.   Therapy Documentation Precautions:  Precautions Precautions: Back Precaution Booklet Issued: No Precaution Comments: Abdominal binder and thigh High ted hose for BP management for OOB. Required Braces or Orthoses: Spinal Brace Spinal Brace: Thoracolumbosacral orthotic, Applied in sitting position Restrictions Weight Bearing Restrictions: No General:      Therapy/Group: Individual Therapy  [name] 02/03/2023, 12:29 PM
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
## 2023-02-04 MED ORDER — BACLOFEN 5 MG HALF TABLET
15.0000 mg | ORAL_TABLET | Freq: Three times a day (TID) | ORAL | Status: DC
  Administered 2023-02-04 – 2023-02-13 (×27): 15 mg via ORAL
  Filled 2023-02-04 (×27): qty 1
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
## 2023-02-04 NOTE — Progress Notes (Signed)
Bowel program started at 1830. Patient made aware of procedure and confirmed understanding. Digital stim performed per order and suppository given.  No output from digital stim and no output from suppository at this time. Patient tolerated well. Oncoming nurse given report and made aware of updates-confirmed understanding.

## 2023-02-04 NOTE — Progress Notes (Signed)
Physical Therapy Session Note  Patient Details  Name: Edward Waller MRN: [id number] Date of Birth: 1933-02-04  Today's Date: 02/04/2023 PT Individual Time: 4098-1191 PT Individual Time Calculation (min): 27 min  and Today's Date: 02/04/2023 PT Missed Time: 18 Minutes Missed Time Reason:  cathing  Short Term Goals: Week 4:  PT Short Term Goal 1 (Week 4): =LTGs d/t ELOS  Skilled Therapeutic Interventions/Progress Updates:    pt received in bed and agreeable to therapy. No complaint of pain. Therapist donned ted hose tot A. Pt missed x 15 min for I/O cath. On return, therapist provided PROM and education for ROM and spasticity management. Pt remained in bed with pillows for therapeutic positioning.  Therapy Documentation Precautions:  Precautions Precautions: Back Precaution Booklet Issued: No Precaution Comments: Abdominal binder and thigh High ted hose for BP management for OOB. Required Braces or Orthoses: Spinal Brace Spinal Brace: Thoracolumbosacral orthotic, Applied in sitting position Restrictions Weight Bearing Restrictions: No General: PT Amount of Missed Time (min): 18 Minutes    Therapy/Group: Individual Therapy  [name] 02/04/2023, 12:46 PM

## 2023-02-04 NOTE — Progress Notes (Signed)
Physical Therapy Session Note  Patient Details  Name: Edward Waller MRN: [id number] Date of Birth: March 23, 1933  Today's Date: 02/04/2023 PT Individual Time: 1047-1200 and 7829-5621 PT Individual Time Calculation (min): 73 min and 30 min.  Short Term Goals: Week 4:  PT Short Term Goal 1 (Week 4): =LTGs d/t ELOS  Skilled Therapeutic Interventions/Progress Updates:   First session:  Pt presents semi-reclined in bed and agreeable to therapy.  Thigh high TEDS already donned.  Pt required total A for threading shorts over B feet and then mod A for hooklying position and roll side to side for completion of donning shorts over hips.  PT donned leg loops w/ Total A.  Pt performed log roll and sidelying to sit w/ mod A.  TLSO donned in siting and shoes donned.  Pt required mod A for lifting R LE to place SB w/ directions from pt for w/c position and mobility.  Pt sliding forward and cued for forward lean to avoid.  Max A for SB transfer and verbal cues for sequencing, +2 available for safety.  Pt BP at 129/66 w/ HR 73 after transfer to w/c, mild c/o dizziness w/ sitting.  Pt wheeled 150' to main gym w/ supervision, but stops needed for positioning trunk in midline.  Pt performed forward lean running hands partially down shins w/ cues for speed for control.  Pt performed chest press, shoulder flexion 3 x 10 w/ 3# weighted bar, c/spine rotation w/ sitting w/o trunk support, assist at shoulders.  Pt required mod A for placing foot rests on while moving LE w/ leg loop and opposite hand.  Pt returned to room w/ supervision.  Pt remained sitting in TIS, reclined back w/ chair alarm on and all needs in reach.  Second session: Pt presents sitting in TIS, reclined back and agreeable to therapy.  Pt wheeled to dayroom w/ supervision.  Pt required mod A for scooting back in TIS for improved posture.  Pt lifting LES for PT to remove foot rests using leg lifters.  Pt performed 3 x 5 w/c push-ups but unable to clear  cushion.  Pt wheeled to room and assisted in positioning next to bed.  Pt leaned for SB placement for w/c > bed transfer w/ uphill bias.  Pt performed w/ max A of 1 and then max A for sit to sidelying transfer to supine.  Leg loops removed and all needs in reach.  NT to cath pt.     Therapy Documentation Precautions:  Precautions Precautions: Back Precaution Booklet Issued: No Precaution Comments: Abdominal binder and thigh High ted hose for BP management for OOB. Required Braces or Orthoses: Spinal Brace Spinal Brace: Thoracolumbosacral orthotic, Applied in sitting position Restrictions Weight Bearing Restrictions: No General:   Vital Signs:   Pain: unrated, pt states "not really pain, but greater than discomfort to back and abd.     Therapy/Group: Individual Therapy  [name] 02/04/2023, 12:06 PM

## 2023-02-04 NOTE — Plan of Care (Signed)
Problem: Activity: Goal: Ability to avoid complications of mobility impairment will improve Outcome: Progressing Goal: Ability to tolerate increased activity will improve Outcome: Progressing   Problem: Pain Management: Goal: Pain level will decrease with appropriate interventions Outcome: Progressing

## 2023-02-04 NOTE — Progress Notes (Signed)
Occupational Therapy Session Note  Patient Details  Name: Edward Waller MRN: [id number] Date of Birth: Apr 05, 1933  Today's Date: 02/04/2023 OT Individual Time: 1303-1400 OT Individual Time Calculation (min): 57 min    Short Term Goals: Week 4:  OT Short Term Goal 1 (Week 4): Pt will don pullover shirt with min A in unsupported sitting OT Short Term Goal 2 (Week 4): [name] transfers with mod A  Skilled Therapeutic Interventions/Progress Updates:  Care coordination with team as it has been decided pt will return home with wife providing assist and [facility] providing all needed [facility] incl HOSPITAL BED, [name], TTB, HOYER LIFT, CUSTOM W/C AND CUSHION WITH LOANER, RAMP AND HHOT  This tx: Pt's lunch tray had not been delivered and replacement lunch arrived during session. Addressed needs for discharge while pt completed lunch including set up of tray and opening containers with increased time but indep. Pressure relief performed after meal in TIS and addressed skin protection for home. OT worked with pt on trunk control with functional reach using reacher from items on floor laterally and anteriorly with placement in both planes R/L. Increased time and effort to coordinate and for recruiting abdominal and trunk mm. Left pt w/c level as per request and needs and safety measures in place.    Pain:  Denies pain    Therapy Documentation Precautions:  Precautions Precautions: Back Precaution Booklet Issued: No Precaution Comments: Abdominal binder and thigh High ted hose for BP management for OOB. Required Braces or Orthoses: Spinal Brace Spinal Brace: Thoracolumbosacral orthotic, Applied in sitting position Restrictions Weight Bearing Restrictions: No  Therapy/Group: Individual Therapy  Vicenta Dunning 02/04/2023, 7:46 AM

## 2023-02-04 NOTE — Progress Notes (Addendum)
Patient ID: Edward Waller, male   DOB: 1932-05-03, 89 y.o.   MRN: [id number]  SW made several efforts to make contact but unable to leave voice mail.  1230-SW returned phone call to [name] to provide updates from team conference and discuss discharge plan. Reports plan is to take pt home due to issues with related to getting into SNF. SW discussed discharge needs- hospital bed, hoyer lift, TTB, [name], and HH needs PT/OT/aide/SN. Pt wife would like SW to order DME through [place]. No [facility] preference. SW discussed ramp is needed at home and encouraged her to speak with family about purchasing a ramp as this will be needed in order to go home with the loaner w/c. She is aware pt will be ambulance transport home. Fam edu Wed and Thurs 1pm-4pm.   SW sent HH orders and DME orders to [facility] and waiting on follow-up.   SW sent [facility] order to Angie[facility] and waiting on follow-up if able to accept.   SW met with pt in room to discuss above.   HH referral accepted by Bobbie Stack, MSW, LCSWA Office: [phone] Cell: [phone] Fax: [phone]

## 2023-02-04 NOTE — Patient Care Conference (Signed)
Inpatient RehabilitationTeam Conference and Plan of Care Update Date: 02/04/2023   Time: 11:18 AM    Patient Name: Edward Waller      Medical Record Number: [id number]  Date of Birth: June 21, 1932 Sex: Male         Room/Bed: 4W08C/4W08C-01 Payor Info: Payor: [employer] / Plan: [facility] / Product Type: *No Product type* /    Admit Date/Time:  01/10/2023  3:40 PM  Primary Diagnosis:  Complete paraplegia [facility])  Hospital Problems: Principal Problem:   Complete paraplegia [facility]) Active Problems:   Malnutrition of moderate degree    Expected Discharge Date: Expected Discharge Date: 02/07/23  Team Members Present: Physician leading conference: Dr. Genice Rouge Social Worker Present: Cecile Sheerer, LCSWA Nurse Present: Vedia Pereyra, RN PT Present: Bernie Covey, PT OT Present: Roney Mans, OT PPS Coordinator present : Fae Pippin, SLP     Current Status/Progress Goal Weekly Team Focus  Bowel/Bladder   Pt currently on bowelprogran with i/o cath q4hrs due to neurogenic B/B   LBM  02/04/23   Pt will regain B/B continence   Continue B/B program and educate pt and family the need for the procedure    Swallow/Nutrition/ Hydration               ADL's   UB pull over shirt CGA, mod-max A bathing, transfers mod/max A, improving self directing and taking care owenership but still needs encouragement for consistency   mod A overall (downgraded); toileting and LB dressing goals d/c   barriers for d/c plan and education    Mobility   mod-max bed mobility with cueing, mod-max with transfer board, w/c eval completed   mod A transfer goals, supervision w/c  barrier SNF d/c vs home    Communication                Safety/Cognition/ Behavioral Observations               Pain   Denies pain at this time   Pt will be free from pain and will verbalize pain as needed   Assess pt for pain qshift/prn    Skin   Pt skin is dryand intact    Pt willmaintain skin integrity with no breakdown  Assisst pt with turning q2 hrsand educate pt on skin breakdown prevention      Discharge Planning:  Family is undecided abotu SNF, but realizes that he may have to discharge to home. Family to review bed offers in [place]. Family meeting held on 9/12. SW will confirm there are no barriers to discharge.   Team Discussion: Complete paraplegia. Bowel/bladder program.  Will have foley prior to discharge. Spasms worse. W/C evaluation completed.  Denies pain. Skin is intact. Tolerating D3 diet. Snack before bed. Back brace OOB. Barriers are self limiting behaviors and poor carryover.  Patient on target to meet rehab goals: Goals downgraded last week, discharge date 02/07/23  *See Care Plan and progress notes for long and short-term goals.   Revisions to Treatment Plan:  Baclofen increased. Miralax added. Hoyer needed for caregiver. Home vs SNF. Monitor labs/VS Teaching Needs: Medications, safety, self care, bowel program and foley care, transfer training, etc.   Current Barriers to Discharge: Decreased caregiver support and Neurogenic bowel and bladder  Possible Resolutions to Barriers: Family education Independent with bowel and bladder care Order recommended DME     Medical Summary Current Status: Patient is paraplegic- neurogenic bowel and bladder- severe worsening spasticity- going home with foley-  skin intact- in TLSO when OOB  Barriers to Discharge: Behavior/Mood;Incontinence;Medical stability;Neurogenic Bowel & Bladder;Spasticity;Weight bearing restrictions  Barriers to Discharge Comments: self limiting- goals mod A now- hoyer lift for care givers- needs hospital bed; bowel abnd bladder Possible Resolutions to Barriers/Weekly Focus: would place foley- for d/c- trying to get family ed for tomorrow- so can decide if SNF vs  home- d/c 10/18   Continued Need for Acute Rehabilitation Level of Care: The patient requires daily  medical management by a physician with specialized training in physical medicine and rehabilitation for the following reasons: Direction of a multidisciplinary physical rehabilitation program to maximize functional independence : Yes Medical management of patient stability for increased activity during participation in an intensive rehabilitation regime.: Yes Analysis of laboratory values and/or radiology reports with any subsequent need for medication adjustment and/or medical intervention. : Yes   I attest that I was present, lead the team conference, and concur with the assessment and plan of the team.   Jearld Adjutant 02/04/2023, 3:04 PM

## 2023-02-05 MED ORDER — SORBITOL 70 % SOLN
45.0000 mL | Freq: Once | Status: AC
  Administered 2023-02-05: 45 mL via ORAL
  Filled 2023-02-05: qty 60

## 2023-02-05 MED ORDER — BACLOFEN 5 MG HALF TABLET
15.0000 mg | ORAL_TABLET | Freq: Every day | ORAL | Status: DC
  Administered 2023-02-06 – 2023-02-13 (×8): 15 mg via ORAL
  Filled 2023-02-05 (×8): qty 1

## 2023-02-05 NOTE — Progress Notes (Signed)
IP Rehab Bowel Program Documentation   Bowel Program Start time [id number]  Dig Stim Indicated? Yes  Dig Stim Prior to Suppository or mini Enema X 2   Output from dig stim: None  Ordered intervention: Suppository Yes , mini enema No ,   Repeat dig stim after Suppository or Mini enema  X 3,  Output? Large   Bowel Program Complete? Yes , handoff given yes  Patient Tolerated? Yes   Dominica Severin .......................RN

## 2023-02-05 NOTE — Progress Notes (Signed)
Patient ID: Edward Waller, male   DOB: [birth year], 89 y.o.   MRN: [id number]  *Primary contact is Pt dtr [name] or granddaughter [name] who is HCPOA.   1322-    1530- After   Cecile Sheerer, MSW, LCSWA Office: [phone] Cell: [phone] Fax: [phone]
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
## 2023-02-05 NOTE — Progress Notes (Addendum)
Physical Therapy Session Note  Patient Details  Name: Edward Waller MRN: [id number] Date of Birth: Jun 21, 1932  Today's Date: 02/05/2023 PT Individual Time: 0915-1000 and 0630-1601 PT Individual Time Calculation (min): 60 min and 72 min  Short Term Goals: Week 4:  PT Short Term Goal 1 (Week 4): =LTGs d/t ELOS  Skilled Therapeutic Interventions/Progress Updates: Pt presented in bed with NT present completing in/out cath. PTA returned ~10 min later once completed. Pt agreeable to therapy. Pt c/o unrated neck pain during session. Session focused on introduction to [facility] in preparation for family ed later this afternoon. PTA donned TED hose and threaded pants total A. Pt was able to complete rolling L/R with minA with pt requiring cues to initiate RUE horizontal abd so that family can assist at scapula. Pt performed additional rolling L/R to allow for hoyer pad placement. Pt then dependently transferred to w/c with manual Hoyer. Once completed pt propelled to main gym with supervision for general conditioning. Pt then participated in the following seated UE therex: Biceps curls 4lb dumbbells 2 x 10, chest press with 4lb dumbbells 2 x 10, scap rows with green theraband 2 x 10. Pt then transported back to room at end of session and remained in w/c with call bell within reach and needs met.    Tx2: Pt presented in w/c having completed lunch agreeable to therapy. Pt states mild unrated neck pain but has been improving. Session to focus on hands on family education however wife is not present at start of session. Pt propelled to day room with supervision. Participated in seated UE therex while waiting for wife for general conditioning. Participated in ball taps with 3lb weighted dowel 2 x 10, forward/backwards circles with 3lb dowel x 20 each direction. Pt's wife then arrived and family education commenced. PTA explained mechanics of Liberty chair (leg rests, tile feature, brakes). PTA also demonstrated  hoyer pad placement as well positioning for pt (tilting chair back) to facilitate improvement of pad. PTA then reviewed mechanics of Michiel Sites and explained the importance of requiring x 2 people. Pt's wife was unable to manage [facility] and during placement onto bed had difficulty managing pts LE. Once pt in bed demonstrated rolling L/R to remove pad and brace. Wife then expressed that the family education has exacerbated her neck and back pain. Discussed necessity to most likely have 2 caregivers and that she will direct care. Wife becoming very overwhelmed therefore discussed with LSW opportunity to get additional caregiver support. Handed off to OT at end of session with pt in bed and current needs met.      Therapy Documentation Precautions:  Precautions Precautions: Back Precaution Booklet Issued: No Precaution Comments: Abdominal binder and thigh High ted hose for BP management for OOB. Required Braces or Orthoses: Spinal Brace Spinal Brace: Thoracolumbosacral orthotic, Applied in sitting position Restrictions Weight Bearing Restrictions: No General: PT Amount of Missed Time (min): 15 Minutes PT Missed Treatment Reason: Toileting;Nursing care (catrh) Vital Signs:     Therapy/Group: Individual Therapy  Lonie Newsham 02/05/2023, 4:13 PM

## 2023-02-05 NOTE — Progress Notes (Signed)
Occupational Therapy Session Note  Patient Details  Name: Edward Waller MRN: [id number] Date of Birth: 04-Oct-1932  Today's Date: 02/05/2023 OT Individual Time: 4696-2952 OT Individual Time Calculation (min): 70 min    Short Term Goals: Week 4:  OT Short Term Goal 1 (Week 4): Pt will don pullover shirt with min A in unsupported sitting OT Short Term Goal 2 (Week 4): [name] transfers with mod A  Skilled Therapeutic Interventions/Progress   Pt seen for skilled OT session for Family Education and Training session with wife [name]. Hand off from PT with updates to re-train with [facility] lift reinforcement. Wife reported she already felt increased LBP and neck pain from 1st session. OT and wife proceeded to place Wolbach pad under pt bed level and began for wife to use lift and became tearful and requested OT to take over completely as she is "unable to do any of this" "I have a bad neck and back and I hurt already". "I am unable to manage this device at all and will need help". OT changed course and had wife direct OT in process which is a 2 person for safety activity. OT educated in skin protection, positioning, ADL strategies, UE ther ex and DME/AD recs. OT assisted pt's wife to initiate home measurement sheet and cross off what did not need measuring. Pt left with wife bedside, needs and safety measures in reach. Care coordination with team re: wife's decision and capabilities.    Pain: denies pain   Therapy Documentation Precautions:  Precautions Precautions: Back Precaution Booklet Issued: No Precaution Comments: Abdominal binder and thigh High ted hose for BP management for OOB. Required Braces or Orthoses: Spinal Brace Spinal Brace: Thoracolumbosacral orthotic, Applied in sitting position Restrictions Weight Bearing Restrictions: No  Therapy/Group: Individual Therapy  Vicenta Dunning 02/05/2023, 7:57 AM

## 2023-02-05 NOTE — Progress Notes (Signed)
IP Rehab Bowel Program Documentation   Bowel Program Start time 1840  Dig Stim Indicated? Yes  Dig Stim Prior to Suppository or mini Enema X 1   Output from dig stim: Large  Ordered intervention: Suppository Yes , mini enema No ,   Repeat dig stim after Suppository or Mini enema  X 2,  Output? Very large   Bowel Program Complete? Yes , handoff given YES  Patient Tolerated? Yes   Sayra Frisby..............................RN

## 2023-02-06 MED ORDER — BUSPIRONE HCL 10 MG PO TABS
10.0000 mg | ORAL_TABLET | Freq: Three times a day (TID) | ORAL | Status: DC
  Administered 2023-02-06 – 2023-02-13 (×21): 10 mg via ORAL
  Filled 2023-02-06 (×20): qty 1

## 2023-02-06 MED ORDER — SORBITOL 70 % SOLN
45.0000 mL | Freq: Once | Status: AC
  Administered 2023-02-06: 45 mL via ORAL
  Filled 2023-02-06: qty 60

## 2023-02-06 NOTE — Progress Notes (Signed)
Occupational Therapy Weekly Progress Note  Patient Details  Name: Edward Waller MRN: [id number] Date of Birth: [birth year]  Beginning of progress report period: January 30, 2023 End of progress report period: February 06, 2023  Today's Date: 02/06/2023 OT Individual Time: 6213-0865 OT Individual Time Calculation (min): 60 min    Patient has met 1 of 2 short term goals and treatment focus has been on Family education and discharge planning. During family education with wife yesterday pt wife reports inability to manage pt's transfers or sling placement with [facility] lift as well as any component of his transfers due to her age and own medical conditions. Pt and wife were able to direct OT with min cues on sling placement, wife able to verbalize steps of Hoyer set up and management with OT performing. Pt completed home measurement sheet and OT shared with PT and only master bath door will not accommodate but tub shower in hallway bath with 27" doorway. Pt demonstrated pull over shirt bed level in full upright position with use of lower rail for pulling back away from bed. Min A fading to CGA this session for pullover shirt. OT spent short amt of time educating pt and wife on w/c components and alerted that most w/c's have different arm rest removal mechanisms but similar tilt and lock systems. Pt's wife and caregivers will perform all initial self care and toileting bed level, however OT will continue to address TB training to [facility] as per S/LTG's for future planning.   Patient continues to demonstrate the following deficits: muscle weakness, muscle joint tightness, and muscle paralysis, decreased cardiorespiratoy endurance, impaired timing and sequencing, abnormal tone, unbalanced muscle activation, decreased coordination, and decreased motor planning, and decreased sitting balance, decreased standing balance, decreased postural control, and decreased balance strategies and therefore will continue to  benefit from skilled OT intervention to enhance overall performance with BADL, iADL, and Reduce care partner burden.  Patient progressing toward long term goals..  Continue plan of care.  OT Short Term Goals Week 4:  OT Short Term Goal 1 (Week 4): Pt will don pullover shirt with min A in unsupported sitting OT Short Term Goal 1 - Progress (Week 4): Met OT Short Term Goal 2 (Week 4): [name] transfers with mod A OT Short Term Goal 2 - Progress (Week 4): Progressing toward goal Week 5:  OT Short Term Goal 1 (Week 5): [name] transfers with mod A OT Short Term Goal 2 (Week 5): STG=LTG's d/t LOS  Skilled Therapeutic Interventions/Progress   See above for all treatment/reassessment and intervention updates. Pt left bed level with needs, nurse call button and nursing present for meds administration. New PRAFO boots requested due to broken positioner and multiple staff unable to repair. No pain reported throughout session.   Therapy Documentation Precautions:  Precautions Precautions: Back Precaution Booklet Issued: No Precaution Comments: Abdominal binder and thigh High ted hose for BP management for OOB. Required Braces or Orthoses: Spinal Brace Spinal Brace: Thoracolumbosacral orthotic, Applied in sitting position Restrictions Weight Bearing Restrictions: No  Therapy/Group: Individual Therapy  Vicenta Dunning 02/06/2023, 7:56 AM

## 2023-02-06 NOTE — Progress Notes (Signed)
Physical Therapy Session Note  Patient Details  Name: Edward Waller MRN: [id number] Date of Birth: [birth year]  Today's Date: 02/06/2023 PT Individual Time: 0915-1000 PT Individual Time Calculation (min): 45 min   Short Term Goals: Week 4:  PT Short Term Goal 1 (Week 4): =LTGs d/t ELOS  Skilled Therapeutic Interventions/Progress Updates: Pt presented in bed eating breakfast as pt had not received previously. Pt denies pain at rest. Session focused on transfers, directing care, and UE therex. Misty Stanley, RT present throughout session. Pt missed 15 min skilled PT to due completing meal. PTA and RT donned TED hose total a followed by threading shorts total A. Pt then completed rolling L/R with near minA to allow therapists to pull pants over hips. Pt completed additional rolling in same manner to allow placement of Hoyer sling. Pt then dependently transferred to w/c via manual Hoyer. TLSO donned in w/c with pt able to use arm rests to pull self up to facilitate placement. Pt propelled to main gym with supervision and completed the following therex as follows.  Shoulder flexion 3lb dowel 2 x 15 Chest press 3lb dowel 2 x 15 Forward/backward shoulder circles 3lb dowel x 15 each.  Pt also worked on using BUE pulling on arm rests to make postural adjustments to maintain midline. Pt was able to complete x 5 forward leans using arm rests consistently.  Pt propleed back to room in same manner as prior and remained in w/c at end of session with call bell within reach and needs met.      Therapy Documentation Precautions:  Precautions Precautions: Back Precaution Booklet Issued: No Precaution Comments: Abdominal binder and thigh High ted hose for BP management for OOB. Required Braces or Orthoses: Spinal Brace Spinal Brace: Thoracolumbosacral orthotic, Applied in sitting position Restrictions Weight Bearing Restrictions: No General: PT Amount of Missed Time (min): 15 Minutes PT Missed Treatment Reason:  Other (Comment) (Breakfast)  Therapy/Group: Individual Therapy  Weldon Nouri 02/06/2023, 4:08 PM
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
## 2023-02-06 NOTE — Progress Notes (Signed)
Patient ID: Edward Waller, male   DOB: 1933-01-15, 89 y.o.   MRN: [id number]  SW sent orders to [facility] SW- Cassie: ramp, over the bed, diapers and chux pads, and specialty w/c tilt in space wheelchair with power tilt and manual wheels. Cassie reports pt can get a standard w/c until specialty w/c is provided; would like recs from w/c eval to support specialty w/c need. Will discuss with family.   Cecile Sheerer, MSW, LCSWA Office: [phone] Cell: [phone] Fax: [phone]

## 2023-02-06 NOTE — Progress Notes (Signed)
Physical Therapy Session Note  Patient Details  Name: Edward Waller MRN: [id number] Date of Birth: Feb 26, 1933  Today's Date: 02/06/2023 PT Individual Time: 1300-1407 PT Individual Time Calculation (min): 67 min   Short Term Goals: Week 3:  PT Short Term Goal 1 (Week 3): Pt will self propel w/c x 300 ft PT Short Term Goal 1 - Progress (Week 3): Met PT Short Term Goal 2 (Week 3): pt will be able to recall steps for SBT and direct therapist PT Short Term Goal 2 - Progress (Week 3): Met PT Short Term Goal 3 (Week 3): Pt will recall steps of bed mobility to direct care PT Short Term Goal 3 - Progress (Week 3): Met  Skilled Therapeutic Interventions/Progress Updates:    Pt seated in w/c on arrival and agreeable to therapy. No complaint of pain.   Pt propelled w/c with BUE  to [facility] entrance for therapeutic benefit of outdoor environment.  Pt performed the following exercises to promote UE strength and endurance:  Bicep curls with GTB 3 x 12  BUE tricep punches with YTB, 3 x 12 Partial w/c push ups 4 x 6. Pt with difficulty, but able to contract triceps in isometric push.  Pt returned to room and returned to room with max x 2 slideboard transfer, max x 2 for bed mobility. Handed off to nsg staff for IO cath. Pt missed 8 min for nsg care.   Therapy Documentation Precautions:  Precautions Precautions: Back Precaution Booklet Issued: No Precaution Comments: Abdominal binder and thigh High ted hose for BP management for OOB. Required Braces or Orthoses: Spinal Brace Spinal Brace: Thoracolumbosacral orthotic, Applied in sitting position Restrictions Weight Bearing Restrictions: No General:       Therapy/Group: Individual Therapy  [name] 02/06/2023, 1:34 PM

## 2023-02-06 NOTE — Progress Notes (Signed)
IP Rehab Bowel Program Documentation   Bowel Program Start time [phone]  Dig Stim Indicated? Yes  Dig Stim Prior to Suppository or mini Enema X 2   Output from dig stim: Large  Ordered intervention: Suppository Yes , mini enema Yes ,   Repeat dig stim after Suppository or Mini enema  X Hand off to night shift,  Output? Minimal (no output on this shift, night shift will do next part)   Bowel Program Complete? No , handoff given Edward Waller  Patient Tolerated? Yes   Patient's wife is retired [employer] fundraiser. Asked her to verbalize procedure and she was able to explain process. She then performed dig stim with patient on left side. Technique is good, only direction was to tell wife to continue with dig stim even when stool is coming out and to maintain for 30 seconds. Repeated twice and rectal vault emptied. Placed suppository because enemeez unavailable and wanted patient's spouse to have full practice session. Able to provide peri care. Patient is able to roll but wife struggled some to get hips in place and lift legs to get good position.

## 2023-02-07 MED ORDER — ENSURE ENLIVE PO LIQD
237.0000 mL | Freq: Two times a day (BID) | ORAL | Status: DC
  Administered 2023-02-07 – 2023-02-11 (×8): 237 mL via ORAL

## 2023-02-07 NOTE — Progress Notes (Signed)
Physical Therapy Note-Wheelchair Evaluation  Patient Details  Name: Edward Waller MRN: [id number] Date of Birth: February 25, 1933 Today's Date: 02/07/2023    Results of w/c eval on 01/28/23 with Fleet Contras, ATP   This therapist recommends a TIS with manual wheels with projection hand rims and power tilt feature. See excerpt from LMN below for justification:   Pt requires the features of [phone] with power tilt and manual push wheels for independent locomotion, spasticity management, transfers, and pressure relief. Tilt in Space frame: Pt does not possess adequate UE strength to perform w/c push up and succesfully clear bony prominences. Pt does not possess adequate sitting balance and core control for safe lateral or anterior leans. TIS is safest and most appropriate form of pressure relief. Pt also has kyphotic posture which limits ability to hold head upright. Tilted position allows for improved engagement with environment. Pt will use manual hoyer lift for transfers, so TIS is required for appropriate positioning in chair and safety of transfer. A fixed tilt or dump will not allow for adapting to manual hoyer position.   Power tilt: Pt's wife's disability prevents her from being able to safely and consistently operate manual TIS chair, requring a power tilt option for pt to independently pressure relieve for the prevention of skin injury. Power tilt also allows pt to go short periods without a caregiver present, reducing burden of care and allowing pt's wife to complete IADLs for pt.  Manual push wheel: Pt requires manual push wheels as his wife's disability prevents her from propelling chair. this also allows for indpendent locomotion and a form of exercise for cardiovascular health. Projection hand rims: Pt has decreased sensation and grip strength which decreases ability to self propel w/c. Pt is unable to efficiently propel without increased grip from projection hand rims.    A lower level  device will not meet pt and caregiver needs for safe independent mobility and skin protection.     [name] 02/07/2023, 8:31 AM

## 2023-02-07 NOTE — Plan of Care (Signed)
CHL Tonsillectomy/Adenoidectomy, Postoperative PEDS care plan entered in error.
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
## 2023-02-07 NOTE — Progress Notes (Signed)
Physical Therapy Session Note  Patient Details  Name: JUWAUN CORRADINO MRN: [id number] Date of Birth: 08/06/1932  Today's Date: 02/07/2023 PT Individual Time: 1440-1545 PT Individual Time Calculation (min): 65 min   Short Term Goals: Week 4:  PT Short Term Goal 1 (Week 4): =LTGs d/t ELOS  Skilled Therapeutic Interventions/Progress Updates:    Pt seated in w/c on arrival and agreeable to therapy. No complaint of pain. Session focused on family education. Topics discussed include plan for w/c, pressure relief, PROM for contracture prevention, bed mobility, and hoyer transfer. Also discussed home set up and possible modifications. Family members performed hoyer transfer with minimal cueing required. Pt remained in bed while nsg provided cath and therapist completed family education. Pt remained with family present and needs in reach.   Therapy Documentation Precautions:  Precautions Precautions: Back Precaution Booklet Issued: No Precaution Comments: Abdominal binder and thigh High ted hose for BP management for OOB. Required Braces or Orthoses: Spinal Brace Spinal Brace: Thoracolumbosacral orthotic, Applied in sitting position Restrictions Weight Bearing Restrictions: No General:      Therapy/Group: Individual Therapy  [name] 02/07/2023, 3:53 PM

## 2023-02-07 NOTE — Progress Notes (Signed)
Occupational Therapy Session Note  Patient Details  Name: Edward Waller MRN: [id number] Date of Birth: 04/09/1933  Today's Date: 02/07/2023 OT Individual Time: 1300-1415 OT Individual Time Calculation (min): 75 min    Short Term Goals: Week 5:  OT Short Term Goal 1 (Week 5): [name] transfers with mod A OT Short Term Goal 2 (Week 5): STG=LTG's d/t LOS  Skilled Therapeutic Interventions/Progress Updates:   Pt seen for skilled OT sesison this pm with focus on Family education with dtr, wife and granddaughter. All 3 in medical profession and have excellent health literacy. 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 OT began session with pt bed level with need for full peri and buttocks change and incontinence brief change, skin protection, bed mobility and LB dressing routine education. All 3 caregivers completed with OT providing some strategies for allowing pt to maximize his participation, capabilities as well as directing care. TED's, leg loops and shorts donned bed level as well as tshirt strategies demonstrated. Hoyer Engineer, technical sales from application of pad with rolling, Veterinary surgeon to [employer] w/c. Family report pt will be obtaining an electric lift however able to demo a manual thus will be able to carryover easily anything more advanced. Pt positioned in TIS and then OT had caregivers repeat sling application and repositioning with lift. Caregivers indep to don TLSO and safely position LE's and head. OT educated in light UE HEP, pressure releifs and sink side ADL's. For now rec bed level toileting but reported OT will continue to address most optimal and progression to higher level spaces as pt tolerates ie [name] or rolling shower chair. Rec HHOT as follow up and all in agreement. Left pt sink side to complete hair and oral care with family assist with communication to primary OT and nurse as well as MSW conducted face to face  and team via secure chat.   Pain: denies any pain this session   Therapy Documentation Precautions:  Precautions Precautions: Back Precaution Booklet Issued: No Precaution Comments: Abdominal binder and thigh High ted hose for BP management for OOB. Required Braces or Orthoses: Spinal Brace Spinal Brace: Thoracolumbosacral orthotic, Applied in sitting position Restrictions Weight Bearing Restrictions: No   Therapy/Group: Individual Therapy  Vicenta Dunning 02/07/2023, 7:30 AM
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
## 2023-02-07 NOTE — Progress Notes (Signed)
Patient ID: Edward Waller, male   DOB: 01-Oct-1932, 89 y.o.   MRN: [id number]  SW faxed order for 83fr catheters to [facility] pharmacy (p:[phone]. [phone]/f:[phone]). *SW confirms order received.   SW received updates from [place] [facility] [place] reporting that pt family has requested for referral to be placed with [facility] program. SW provided updates clinicals for w/c eval, HHSW order, and standard w/c order as she is unsure their vendor will provide a specialty w/c before discharge.   1013-SW spoke with Lendon Collar- SCI coordinator [phone]. [phone]) to discuss referral. States she was informed about interest, and all SCI patients are to be referred to her prior to any admission to a community hospital by the [place]. Reports she is aware of this referral and has alerted [facility] about referral. SW informed will fax referral.   Referral faxed to [facility] admissions Coord ([phone]. [phone]/f:[phone]/cell:[phone]).  1043-SW left message for Laurence Spates on work cell and office number to confirm if referral has been received.   1122- SW received phone call from pt granddaughter [name] who reported that pt in agreement with placement, and all family in agreement with placement here as well if a bed remains. SW did inform referral has been made and waiting to hear about bed offer. Discussed to still come in for family bed as unaware if a bed offer remains in place. She would like to know if [facility] is an option with their program as well. SW shared will provide updates as available.    Cecile Sheerer, MSW, LCSWA Office: [phone] Cell: [phone] Fax: [phone]

## 2023-02-08 DIAGNOSIS — F32A Depression, unspecified: Secondary | ICD-10-CM
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
## 2023-02-08 NOTE — Progress Notes (Signed)
IP Rehab Bowel Program Documentation   Bowel Program Start time [phone]  Dig Stim Indicated? Yes  Dig Stim Prior to Suppository or mini Enema X 2 -per Day shift LPN report  Output from dig stim: Moderate-per dayshift LPN report  Ordered intervention: Suppository Yes ,-per dayshift LPN report mini enema No ,   Repeat dig stim after Suppository or Mini enema  X 2,  Output? Large amount of soft stool removed   Bowel Program Complete? Yes , handoff given This RN completed program  Patient Tolerated? Yes

## 2023-02-09 DIAGNOSIS — F515 Nightmare disorder: Secondary | ICD-10-CM

## 2023-02-09 DIAGNOSIS — E440 Moderate protein-calorie malnutrition: Secondary | ICD-10-CM

## 2023-02-09 MED ORDER — MELATONIN 5 MG PO TABS: 5.0000 mg | ORAL_TABLET | Freq: Every day | ORAL | Status: DC

## 2023-02-09 NOTE — Progress Notes (Signed)
IP Rehab Bowel Program Documentation   Bowel Program Start time 1825  Dig Stim Indicated? Yes  Dig Stim Prior to Suppository or mini Enema X 2   Output from dig stim: Small  Ordered intervention: Suppository Yes , mini enema No ,   Repeat dig stim after Suppository or Mini enema  X 2,  Output? Large of soft pasty stool. Unable to fully clear rectal vault due to consistency  Bowel Program Complete? Yes , handoff given   Patient Tolerated? Yes

## 2023-02-09 NOTE — Progress Notes (Signed)
Patient called staff in requesting help about 0230. Patient appeared to be having vivid nightmare. Emotional support provided and patient easily reoriented. Per NT report this is the second night that this has happened. Patient reports concern it could be medication related. Will report this to oncoming nurse this a.m
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
## 2023-02-10 LAB — CBC WITH DIFFERENTIAL/PLATELET
Abs Immature Granulocytes: 0.02 10*3/uL (ref 0.00–0.07)
Basophils Absolute: 0 10*3/uL (ref 0.0–0.1)
Basophils Relative: 1 %
Eosinophils Absolute: 0.1 10*3/uL (ref 0.0–0.5)
Eosinophils Relative: 1 %
HCT: 42.6 % (ref 39.0–52.0)
Hemoglobin: 13.9 g/dL (ref 13.0–17.0)
Immature Granulocytes: 0 %
Lymphocytes Relative: 15 %
Lymphs Abs: 1.2 10*3/uL (ref 0.7–4.0)
MCH: 31 pg (ref 26.0–34.0)
MCHC: 32.6 g/dL (ref 30.0–36.0)
MCV: 94.9 fL (ref 80.0–100.0)
Monocytes Absolute: 0.6 10*3/uL (ref 0.1–1.0)
Monocytes Relative: 8 %
Neutro Abs: 6.1 10*3/uL (ref 1.7–7.7)
Neutrophils Relative %: 75 %
Platelets: 238 10*3/uL (ref 150–400)
RBC: 4.49 MIL/uL (ref 4.22–5.81)
RDW: 13.6 % (ref 11.5–15.5)
WBC: 8.1 10*3/uL (ref 4.0–10.5)
nRBC: 0 % (ref 0.0–0.2)

## 2023-02-10 LAB — COMPREHENSIVE METABOLIC PANEL
ALT: 31 U/L (ref 0–44)
AST: 20 U/L (ref 15–41)
Albumin: 2.8 g/dL — ABNORMAL LOW (ref 3.5–5.0)
Alkaline Phosphatase: 91 U/L (ref 38–126)
Anion gap: 9 (ref 5–15)
BUN: 20 mg/dL (ref 8–23)
CO2: 28 mmol/L (ref 22–32)
Calcium: 9.9 mg/dL (ref 8.9–10.3)
Chloride: 99 mmol/L (ref 98–111)
Creatinine, Ser: 0.97 mg/dL (ref 0.61–1.24)
GFR, Estimated: >60 mL/min (ref >60)
Glucose, Bld: 138 mg/dL — ABNORMAL HIGH (ref 70–99)
Potassium: 4.6 mmol/L (ref 3.5–5.1)
Sodium: 136 mmol/L (ref 135–145)
Total Bilirubin: 0.9 mg/dL (ref 0.3–1.2)
Total Protein: 7.2 g/dL (ref 6.5–8.1)

## 2023-02-10 MED ORDER — SORBITOL 70 % SOLN
60.0000 mL | Freq: Once | Status: AC
  Administered 2023-02-10: 60 mL via ORAL
  Filled 2023-02-10: qty 60

## 2023-02-10 NOTE — Progress Notes (Addendum)
Patient ID: Edward Waller, male   DOB: 1932/09/14, 89 y.o.   MRN: [id number]  0935- SW spoke with [facility] Daniels-covering admissions Coord [phone] ext. [phone]/f:[phone]/cell:[phone]) to discuss referral faxed. Confirms referral received. States primary coordinator Leanor Rubenstein has returned and she will follow-up after daily meeting after 11am with updates.   Update from [place]- request for manual loaner wheelchair is pending, and pt will need to be evaluated by [facility] for the specialty chair; PCP alerted  to request the seating evaluation. This could also be completed while at [facility]. [facility] sent request for quote to a contractor on the ramp. Hospital bed, tub bench, BSC, and hoyer lift purchase orders were all sent to [employer] for delivery on 02/06/23. [facility] extended [name]'s rehab auth until 10/27 to allow for coordination of his DME and possible transfer to [facility].  *SW received phone call from pt dtr [name] who was inquiring about updates. SW shared above  updates. She will confirm if DME has been delivered as it had not arrived yesterday. SW informed will provide updates as available.   Cecile Sheerer, MSW, LCSWA Office: [phone] Cell: [phone] Fax: [phone]

## 2023-02-10 NOTE — Progress Notes (Signed)
Occupational Therapy Session Note  Patient Details  Name: Edward Waller MRN: [id number] Date of Birth: 15-Feb-1933  Today's Date: 02/10/2023 OT Individual Time: 1303-1400 OT Individual Time Calculation (min): 57 min    Short Term Goals: Week 4:  OT Short Term Goal 1 (Week 4): Pt will don pullover shirt with min A in unsupported sitting OT Short Term Goal 1 - Progress (Week 4): Met OT Short Term Goal 2 (Week 4): [name] transfers with mod A OT Short Term Goal 2 - Progress (Week 4): Progressing toward goal  Skilled Therapeutic Interventions/Progress Updates:   Pt seen for skilled OT session. Pt bed level completing lunch meal. Open for EOB activity as nursing requested pt remain bed level at end of session for bladder management and scan. OT addressed:  -Supine to sit  -Use of leg loops -Sitting balance activity with focus on midline and core work during functional reach, hand use as well as simple grooming tasks  Pt with increased overall body awareness of midline and core activation. Pt handoff once bed level to NT and nurse for toileting bed level.   Pain:   Denies pain    Therapy Documentation Precautions:  Precautions Precautions: Back Precaution Booklet Issued: No Precaution Comments: Abdominal binder and thigh High ted hose for BP management for OOB. Required Braces or Orthoses: Spinal Brace Spinal Brace: Thoracolumbosacral orthotic, Applied in sitting position Restrictions Weight Bearing Restrictions: No   Therapy/Group: Individual Therapy  Vicenta Dunning 02/10/2023, 7:31 AM
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
## 2023-02-10 NOTE — Progress Notes (Signed)
Occupational Therapy Session Note  Patient Details  Name: Edward Waller MRN: [id number] Date of Birth: [birth year]  Today's Date: 02/10/2023 OT Individual Time: 0700-0810 OT Individual Time Calculation (min): 70 min    Short Term Goals: Week 5:  OT Short Term Goal 1 (Week 5): [name] transfers with mod A OT Short Term Goal 2 (Week 5): STG=LTG's d/t LOS  Skilled Therapeutic Interventions/Progress Updates:    Pt resting in bed upon arrival. OT intervention with focus on bed mobility, dressing at bed level, sitting balance, SB transfer to w/c. Rolling in bed with min A for BLE mgmt. Dependent for LB dressing including leg loops. Pt able to pull into upright sitting in bed to facilitate doffing/donning pull over shirt. Sitting balance EOB with CGA while LSO donned. SB transfer to w/c with max A+2. Pt remained in w/c with all needs within reach.   Therapy Documentation Precautions:  Precautions Precautions: Back Precaution Booklet Issued: No Precaution Comments: Abdominal binder and thigh High ted hose for BP management for OOB. Required Braces or Orthoses: Spinal Brace Spinal Brace: Thoracolumbosacral orthotic, Applied in sitting position Restrictions Weight Bearing Restrictions: No Pain:  Pt with onoging BLE spasms with occasional associated pain; MD aware and repositioned   Therapy/Group: Individual Therapy  [name] 02/10/2023, 8:11 AM

## 2023-02-10 NOTE — Progress Notes (Signed)
Physical Therapy Session Note  Patient Details  Name: Edward Waller MRN: [id number] Date of Birth: [birth year]  Today's Date: 02/10/2023 PT Individual Time: 0900-1000 PT Individual Time Calculation (min): 60 min   Short Term Goals: Week 4:  PT Short Term Goal 1 (Week 4): =LTGs d/t ELOS  Skilled Therapeutic Interventions/Progress Updates:    Pt seated in w/c on arrival and agreeable to therapy. Pt reports some neck pain with activity, responded well to manual therapy at upper trap, scalenes, and levator scap and manual shoulder retraction stretch.   Session focused on standing in standing frame for spasticity management. Pt able to tolerate 3 bouts of 3-4 min but had some light headedness on last stand.   Pt returned to room and performed max at slideboard transfer with +2 standing by. Cues for body positioning for improved participation in transfer.   Sit>supine with max a, but improved ability to manage trunk vs previous sessions. Pt rolled with as little as min A-CGA once LE was positioned. Dependent brief change for incontinent BM, documented in flow sheet.   Pt remained in bed after session with needs in reach.      Therapy Documentation Precautions:  Precautions Precautions: Back Precaution Booklet Issued: No Precaution Comments: Abdominal binder and thigh High ted hose for BP management for OOB. Required Braces or Orthoses: Spinal Brace Spinal Brace: Thoracolumbosacral orthotic, Applied in sitting position Restrictions Weight Bearing Restrictions: No General:       Therapy/Group: Individual Therapy  [name] 02/10/2023, 10:48 AM

## 2023-02-11 ENCOUNTER — Inpatient Hospital Stay (HOSPITAL_COMMUNITY): Payer: No Typology Code available for payment source

## 2023-02-11 MED ORDER — POLYETHYLENE GLYCOL 3350 17 G PO PACK
34.0000 g | PACK | Freq: Once | ORAL | Status: AC
  Administered 2023-02-11: 34 g via ORAL

## 2023-02-11 MED ORDER — SIMETHICONE 80 MG PO CHEW
160.0000 mg | CHEWABLE_TABLET | Freq: Four times a day (QID) | ORAL | Status: DC
  Administered 2023-02-11 – 2023-02-13 (×9): 160 mg via ORAL
  Filled 2023-02-11 (×9): qty 2

## 2023-02-11 MED ORDER — SENNOSIDES-DOCUSATE SODIUM 8.6-50 MG PO TABS
3.0000 | ORAL_TABLET | Freq: Every day | ORAL | Status: DC
  Administered 2023-02-12 – 2023-02-13 (×2): 3 via ORAL
  Filled 2023-02-11 (×2): qty 3

## 2023-02-11 MED ORDER — POLYETHYLENE GLYCOL 3350 17 G PO PACK
34.0000 g | PACK | Freq: Every day | ORAL | Status: DC
  Administered 2023-02-12 – 2023-02-13 (×2): 34 g via ORAL
  Filled 2023-02-11 (×3): qty 2

## 2023-02-11 NOTE — Progress Notes (Signed)
Physical Therapy Weekly Progress Note  Patient Details  Name: Edward Waller MRN: [id number] Date of Birth: March 07, 1933  Beginning of progress report period: February 03, 2023 End of progress report period: February 11, 2023  Today's Date: 02/11/2023 PT Individual Time: 1000-1100 PT Individual Time Calculation (min): 60 min   Patient has met 1 of 1 short term goals.  Pt is making slow but steady progress, with gains in sitting balance  and positioning/participation in transfers. Pt still requires max a for slideboard transfer, but can intermittently perform with +1 assist and demoes some scoots at mod A level. Family education has been completed. Specialty w/c eval completed, but [facility] so recommendations forwarded to allow them to repeat eval. Pt's stay has been extended to await for transfer to [facility] rehab center vs d/c home.   Patient continues to demonstrate the following deficits muscle weakness and muscle paralysis, abnormal tone and decreased coordination, and decreased sitting balance, decreased postural control, decreased balance strategies, and difficulty maintaining precautions and therefore will continue to benefit from skilled PT intervention to increase functional independence with mobility.  Patient progressing toward long term goals..  Continue plan of care.  PT Short Term Goals Week 5:  PT Short Term Goal 1 (Week 5): =LTGs d/t ELOS  Skilled Therapeutic Interventions/Progress Updates:    Pt seated in w/c on arrival and agreeable to therapy. No complaint of pain.   Pt propelled w/c with BUE to/from therapy gym with supervision for endurance and functional mobility.   slideboard transfer to mat table with as little as mod a for some scoots, but max a overall, (no +2.) max a and +2 standing by for safety for transfer back to w/c and then to bed. Pt demoes greatly improved anterior weight shift and push with BUE, however, did fatigue quickly with UE exercise  during session.   Session focused on sitting balance and UE strength sitting EOM. Pt performed lateral and anterior leans, including lateral lean pressure relief x 2 min BIL. Push ups and pull ups(from sit up position) using dowel for support. Pt found to be able to maintain sitting balance for brief intervals with UE inside BOS, but was not able to when challenged to reach outside BOS.   Pt remained in bed after session per nsg request for cathing.   Therapy Documentation Precautions:  Precautions Precautions: Back Precaution Booklet Issued: No Precaution Comments: Abdominal binder and thigh High ted hose for BP management for OOB. Required Braces or Orthoses: Spinal Brace Spinal Brace: Thoracolumbosacral orthotic, Applied in sitting position Restrictions Weight Bearing Restrictions: No General:      Therapy/Group: Individual Therapy  [name] 02/11/2023, 11:24 AM
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
## 2023-02-11 NOTE — Progress Notes (Incomplete)
Patient ID: Edward Waller, male   DOB: 09/25/1932, 89 y.o.   MRN: [id number]   1127- SW returned phone call to [place]/[facility] Coord[facility] [phone] ext [phone]) to discuss bed availability. Reports there are no beds available at this time. Discussed admission from, and states this is an option if a bed becomes available. States his information is uploaded to the system for continued review. Admission is based on first come-first serve.   1231- SW followed up with pt granddaughter to inform on above. SW discussed  transition to home since program is not an option, and unsure on bed availability. SW discussed DME, and ramp. DME was delivered yesterday, and reporting limited funds to build a ramp. SW informed will discuss with attending, as unsure pt is able to remain here until [place] is able to build ramp.   SW spoke with John/[employer] [phone]) o discuss ramp and when it will be installed. Reports ramp measurements were taken yesterday, and approved today and received purchase order today. The order will be placed tomorrow, and takes atleast two weeks before item will be delivered.  SW shared with attending. Attending to follow-up with family.   *SW followed up with pt after speaking with  attending. Thursday discharge via ambulance transport. SW will schedule transportation for pick up at 10:30am/1045. Reports she will follow-up if she is able to be there at time of his arrival.   1450-SW received return phone call from pt granddaughter to inform on availability to be  home at 10:30am/10:45am with pt grandmother when pt arrives home.    1452- [facility]. SW informed will reach out to vendor about status of delivery. SW will follow-up tomorrow.  1454- SW spoke with Bob/[facility] ambulance arranging pick up for Thursday (10/24) at 11:30am.   SW updated pt granddaughter on change in pick up time.   SW informed [facility] SW Cassie to inform on  missing DME items: over the bed table, diapers/chux pads, and manual w/c until appointment with vendor about specialty wheelchair.   Cecile Sheerer, MSW, LCSWA Office: [phone] Cell: [phone] Fax: [phone]

## 2023-02-11 NOTE — Patient Care Conference (Signed)
Inpatient RehabilitationTeam Conference and Plan of Care Update Date: 02/11/2023  Time: 11:10 AM   Patient Name: Edward Waller      Medical Record Number: [id number]  Date of Birth: [birth year] Sex: Male         Room/Bed: 4W08C/4W08C-01 Payor Info: Payor: [employer] / Plan: [facility] / Product Type: *No Product type* /    Admit Date/Time:  01/10/2023  3:40 PM  Primary Diagnosis:  Complete paraplegia [facility])  Hospital Problems: Principal Problem:   Complete paraplegia [facility]) Active Problems:   Malnutrition of moderate degree    Expected Discharge Date: Expected Discharge Date: 02/13/23  Team Members Present: Physician leading conference: Dr. Genice Rouge Social Worker Present: Cecile Sheerer, LCSWA Nurse Present: Vedia Pereyra, RN PT Present: Bernie Covey, PT OT Present: Roney Mans, OT PPS Coordinator present : Fae Pippin, SLP     Current Status/Progress Goal Weekly Team Focus  Bowel/Bladder   Patient is incontinent of bowel and bladder.  Also retains urine and we are straight cathing him Q4.   Patient is tolerating this well but his sleep is interrupted too often at night.  Scanning Q6 overnight would be helpful to allow sleep.   Continue with current plan.  Wife is competent to in and out cath patient.  Continue with bowel program.    Swallow/Nutrition/ Hydration               ADL's   UB dressing-CGA, bathing/LB dressing at bed level-tot A; transfers max A   mod A overall (downgraded); toileting and LB dressing goals d/c   sitting balance, transfers, education    Mobility   mod-max bed mobility with cueing, mod-max with transfer board, family ed completed   mod A transfer goals, supervision w/c  barrier: unsure d/c plan    Communication                Safety/Cognition/ Behavioral Observations               Pain   Patient usually denies pain except in the back of his neck.  The lidocaine patches give minimal  release. He also under reports pain.   Patient to rate pain accurately.  Patient to have neck pain more well controlled.   Hoping that he could perhaps get something more effective for his neck pain such as voltaren.    Skin   Patient is at high risk for skin breakdown.  Mepilex in place on sacrum.  Elbow pads in place.   Keep skin from breaking down.  Promoting good nutrition, increased protein intake, increase time out of bed, continue with low air loss bed.      Discharge Planning:  Discharge pending is accepted in [place] [facility] program in [place], [place]. SW waiting to confirm if DME delivered to home by [place]. [facility] for HHPT/OT/aide/SN. SW will confirm there are  no barriers to discharge.   Team Discussion: Complete paraplegia. Bowel program with I/O cath Q 4 hours. Wife trained with bowel/bladder program and is a retired Engineer, civil (consulting). Spasticity better. Occasional pain managed with scheduled and PRN medications. MASD to sacrum. Back brace OOB. Tray set up with every meal. Tolerating D3 diet. Was not accepted at [facility] program.  Patient on target to meet rehab goals: yes, meeting most goals with discharge to home 02/13/23.   *See Care Plan and progress notes for long and short-term goals.   Revisions to Treatment Plan:  Discontinue bladder scan.  Therapy  moved to 15/7. Monitor labs/VS Teaching Needs: Medications, safety, self care, transfer training, bowel/bladder care, skin care, etc.   Current Barriers to Discharge: Decreased caregiver support and Neurogenic bowel and bladder  Possible Resolutions to Barriers: Family education completed Independent with bowel/bladder care Order recommended DME     Medical Summary Current Status: spasticity doing a little better- incresed baclofen and Zaanflex last week-acrum has MASD- back brace OOB;  Barriers to Discharge: Behavior/Mood;Incontinence;Neurogenic Bowel & Bladder;Spasticity;Self-care education;Weight bearing restrictions   Barriers to Discharge Comments: increased baclofen and zanaflex in last week- waiting to hear if got into [place] for rehab Possible Resolutions to [employer] Focus: will switch to 15/7- d/c not sure since waiting on [place] to tell us. family ed has been done- wife knows how to cth and don't want bowel program-   Continued Need for Acute Rehabilitation Level of Care: The patient requires daily medical management by a physician with specialized training in physical medicine and rehabilitation for the following reasons: Direction of a multidisciplinary physical rehabilitation program to maximize functional independence : Yes Medical management of patient stability for increased activity during participation in an intensive rehabilitation regime.: Yes Analysis of laboratory values and/or radiology reports with any subsequent need for medication adjustment and/or medical intervention. : Yes   I attest that I was present, lead the team conference, and concur with the assessment and plan of the team.   Jearld Adjutant 02/12/2023, 8:26 AM

## 2023-02-11 NOTE — Progress Notes (Signed)
Occupational Therapy Session Note  Patient Details  Name: Edward Waller MRN: [id number] Date of Birth: [birth year]  Today's Date: 02/11/2023 OT Individual Time: 1300-1410 OT Individual Time Calculation (min): 70 min    Short Term Goals: Week 4:  OT Short Term Goal 1 (Week 4): Pt will don pullover shirt with min A in unsupported sitting OT Short Term Goal 1 - Progress (Week 4): Met OT Short Term Goal 2 (Week 4): [name] transfers with mod A OT Short Term Goal 2 - Progress (Week 4): Progressing toward goal  Skilled Therapeutic Interventions/Progress Updates:   Pt bed level upon OT arrival. Open to all activity EOB and bed level as nursing reported pt needs cathing after session.   MSW updated secure chat that pt to d/c Th 10/24 with grad day planned for tomorrow 10/23.   Sitting balance and core work with focus on STG of donning pullover shirt unsupported with min A EOB functional reach for horseshoe activity 30  min  UB bathing, grooming and UE pullover shirt EOB with wife present for further Family Educ on spinal prec integration, maximizing pt participation, B UE HEP   B UE 4.4 lb weighted ball for bed level supported for prox and distal strength training 3 sets of 10 reps   New PRAFO's delivered this session to replace broken positioner  Left bed level for nursing care with all needs, nurse call button, safety measures and wife bedside.   Pain: denies pain  Therapy Documentation Precautions:  Precautions Precautions: Back Precaution Booklet Issued: No Precaution Comments: Abdominal binder and thigh High ted hose for BP management for OOB. Required Braces or Orthoses: Spinal Brace Spinal Brace: Thoracolumbosacral orthotic, Applied in sitting position Restrictions Weight Bearing Restrictions: No  Therapy/Group: Individual Therapy  Vicenta Dunning 02/11/2023, 7:40 AM

## 2023-02-11 NOTE — Progress Notes (Signed)
Occupational Therapy Session Note  Patient Details  Name: Edward Waller MRN: [id number] Date of Birth: Jul 04, 1932  Today's Date: 02/11/2023 OT Individual Time: 0700-0813 OT Individual Time Calculation (min): 73 min    Short Term Goals: Week 4:  OT Short Term Goal 1 (Week 4): Pt will don pullover shirt with min A in unsupported sitting OT Short Term Goal 1 - Progress (Week 4): Met OT Short Term Goal 2 (Week 4): [name] transfers with mod A OT Short Term Goal 2 - Progress (Week 4): Progressing toward goal  Skilled Therapeutic Interventions/Progress Updates:    Pt resting in bed upon arrival. OT intervention with focus on bed mobility, UB dressing, sitting balance EOB, SB transfers, w/c mobility, and BUE strengthing. Rolling R/L in bed with min A using bed rails. UB dressing with min A at bed level using bed rails to pull into sitting. Supine>sit EOB with max A. Sitting balance EOB with CGA/min A. SB transfer with max A+2. W/c mobility with supervision. BUE therex with 3# bar-chest presses 3x12 and 2x10 with w/c in upright position. CGA for sitting balance when upright. Pt returned to room and remained in w/c. All needs wihtin reach.   Therapy Documentation Precautions:  Precautions Precautions: Back Precaution Booklet Issued: No Precaution Comments: Abdominal binder and thigh High ted hose for BP management for OOB. Required Braces or Orthoses: Spinal Brace Spinal Brace: Thoracolumbosacral orthotic, Applied in sitting position Restrictions Weight Bearing Restrictions: No Pain:  Pt denies pain this morning. Pt reports BLE spasms are "better"   Therapy/Group: Individual Therapy  [name] 02/11/2023, 8:14 AM

## 2023-02-11 NOTE — Progress Notes (Signed)
Physical Therapy Note  Patient Details  Name: Edward Waller MRN: [id number] Date of Birth: December 03, 1932 Today's Date: 02/11/2023    Pt's plan of care adjusted to 15/7 after speaking with care team and discussed with MD in team conference as pt currently unable to tolerate current therapy schedule with OT, PT, and SLP.     [name] 02/11/2023, 12:09 PM

## 2023-02-12 MED ORDER — TIZANIDINE HCL 4 MG PO TABS
4.0000 mg | ORAL_TABLET | Freq: Three times a day (TID) | ORAL | Status: DC
  Administered 2023-02-12 – 2023-02-13 (×4): 4 mg via ORAL
  Filled 2023-02-12 (×3): qty 1

## 2023-02-12 NOTE — Progress Notes (Signed)
Orthopedic Tech Progress Note Patient Details:  Edward Waller Aug 11, 1932 [id number]  Ortho Devices Type of Ortho Device: Prafo boot/shoe Ortho Device/Splint Location: BLE Ortho Device/Splint Interventions: Ordered   Post Interventions Patient Tolerated: Well Instructions Provided: Adjustment of device PRAFOs issued on 10/22 secondary to report of older PRAFOs no longer working  Edward Waller  OTR/L 02/12/2023, 11:36 AM

## 2023-02-12 NOTE — Progress Notes (Signed)
Occupational Therapy Discharge Summary  Patient Details  Name: Edward Waller MRN: [id number] Date of Birth: [birth year]  Date of Discharge from OT service:February 12, 2023   Patient has met 4 of 4 long term goals due to improved activity tolerance, improved balance, postural control, ability to compensate for deficits, functional use of  RIGHT upper and LEFT upper extremity, and improved coordination.  Pt made slow and minimal functional progress during this admission. LB dressing, toileting, and transfer LTGs discharged during hospital stay 2/2 lack of progress. Pt dependent for LB dressing, toileting, and all transfers. Bathing with mod A and UB dressing with min A. Pt able to maintain static sitting balance with min A with TLSO donned. Pt requires max A for dynamic sitting balance. Pt's daughter and granddaughter have participated in therapy. Pt discharging home to await placement in [place] [facility] program.Patient to discharge at overall Max Assist level.  Patient's care partner is independent to provide the necessary physical assistance at discharge.    Reasons goals not met: n/a  Recommendation:  Patient will benefit from ongoing skilled OT services in home health setting to continue to advance functional skills in the area of BADL and Reduce care partner burden.  Equipment: [name] , hoyer  Reasons for discharge: treatment goals met and discharge from hospital  Patient/family agrees with progress made and goals achieved: Yes  OT Discharge ADL ADL Equipment Provided: Leg straps Eating: Supervision/safety Where Assessed-Eating: Wheelchair Grooming: Supervision/safety Where Assessed-Grooming: Wheelchair, Sitting at sink Upper Body Bathing: Supervision/safety Where Assessed-Upper Body Bathing: Bed level Lower Body Bathing: Moderate assistance Where Assessed-Lower Body Bathing: Bed level Upper Body Dressing: Minimal assistance Where Assessed-Upper Body Dressing: Wheelchair Lower Body  Dressing: Dependent Where Assessed-Lower Body Dressing: Bed level Toileting: Dependent Where Assessed-Toileting: Bed level Toilet Transfer: Dependent Toilet Transfer Equipment: Other (comment) Water quality scientist) Tub/Shower Transfer: Not assessed Film/video editor: Not assessed Vision Baseline Vision/History: 0 No visual deficits Patient Visual Report: No change from baseline Vision Assessment?: No apparent visual deficits Perception  Perception: Within Functional Limits Praxis Praxis: WFL Cognition Cognition Overall Cognitive Status: Within Functional Limits for tasks assessed Orientation Level: Person;Situation;Place Person: Oriented Place: Oriented Situation: Oriented Memory: Appears intact Awareness: Appears intact Problem Solving: Appears intact Safety/Judgment: Appears intact Brief Interview for Mental Status (BIMS) Repetition of Three Words (First Attempt): 3 Temporal Orientation: Year: Correct Temporal Orientation: Month: Accurate within 5 days Temporal Orientation: Day: Correct Recall: "Sock": No, could not recall Recall: "Blue": Yes, no cue required Recall: "Bed": Yes, no cue required BIMS Summary Score: 13 Sensation Sensation Light Touch: Impaired Detail Peripheral sensation comments: absent sensation from waist down Light Touch Impaired Details: Absent LLE;Absent RLE Hot/Cold: Appears Intact Proprioception: Not tested Stereognosis: Not tested Coordination Gross Motor Movements are Fluid and Coordinated: Yes Fine Motor Movements are Fluid and Coordinated: Yes Finger Nose Finger Test: Smooth equal movements in B UEs Motor  Motor Motor: Paraplegia Motor - Skilled Clinical Observations: Decreased sitting balance d/t paraplegia Mobility     Trunk/Postural Assessment  Cervical Assessment Cervical Assessment: Within Functional Limits Thoracic Assessment Thoracic Assessment: Exceptions to WFL (TLSO, spinal precautions) Lumbar Assessment Lumbar Assessment:  Exceptions to [facility] (spinal precautions, TLSO) Postural Control Postural Control: Deficits on evaluation  Balance Static Sitting Balance Static Sitting - Level of Assistance: 4: Min assist Dynamic Sitting Balance Dynamic Sitting - Balance Support: During functional activity Dynamic Sitting - Level of Assistance: 3: Mod assist Extremity/Trunk Assessment RUE Assessment RUE Assessment: Within Functional Limits LUE Assessment LUE Assessment: Within Functional Limits  Lavone Neri [facility] 02/12/2023, 9:12 AM

## 2023-02-12 NOTE — Progress Notes (Signed)
Physical Therapy Discharge Summary  Patient Details  Name: Edward Waller MRN: [id number] Date of Birth: 01-02-1933  Date of Discharge from PT service:February 12, 2023  Today's Date: 02/12/2023 PT Individual Time:  -       Patient has met {NUMBERS 0-12:18577} of {NUMBERS 0-12:18577} long term goals due to {due WU:9811914}.  Patient to discharge at [facility] level {LOA:3049010}.   Patient's care partner {care partner:3041650} to provide the necessary {assistance:3041652} assistance at discharge.  Reasons goals not met: ***  Recommendation:  Patient will benefit from ongoing skilled PT services in {setting:3041680} to continue to advance safe functional mobility, address ongoing impairments in ***, and minimize fall risk.  Equipment: {equipment:3041657}  Reasons for discharge: treatment goals met and discharge from hospital  Patient/family agrees with progress made and goals achieved: Yes  PT Discharge Precautions/Restrictions Precautions Precautions: Back Precaution Booklet Issued: No Precaution Comments: Abdominal binder and thigh High ted hose for BP management for OOB. Required Braces or Orthoses: Spinal Brace Spinal Brace: Thoracolumbosacral orthotic;Applied in sitting position Restrictions Weight Bearing Restrictions: No Vital Signs Therapy Vitals Pulse Rate: 74 BP: 137/73 Oxygen Therapy SpO2: 100 % Pain   Pain Interference Pain Interference Pain Effect on Sleep: 3. Frequently Pain Interference with Therapy Activities: 1. Rarely or not at all Pain Interference with Day-to-Day Activities: 2. Occasionally Vision/Perception  Vision - History Ability to See in Adequate Light: 0 Adequate Perception Perception: Within Functional Limits Praxis Praxis: WFL  Cognition Overall Cognitive Status: Within Functional Limits for tasks assessed Orientation Level: Oriented X4 Memory: Appears intact Memory Impairment: Decreased recall of new information;Retrieval  deficit Awareness: Appears intact Problem Solving: Appears intact Safety/Judgment: Appears intact Sensation Sensation Light Touch: Impaired Detail Peripheral sensation comments: absent sensation from waist down Light Touch Impaired Details: Absent LLE;Absent RLE Hot/Cold: Appears Intact Proprioception: Not tested Stereognosis: Not tested Coordination Gross Motor Movements are Fluid and Coordinated: Yes Fine Motor Movements are Fluid and Coordinated: Yes Finger Nose Finger Test: Smooth equal movements in B UEs Motor  Motor Motor: Paraplegia Motor - Skilled Clinical Observations: Decreased sitting balance d/t paraplegia  Mobility Bed Mobility Bed Mobility: Rolling Right;Rolling Left;Supine to Sit;Sit to Supine Rolling Right: Minimal Assistance - Patient > 75% Rolling Left: Minimal Assistance - Patient > 75% Supine to Sit: Maximal Assistance - Patient - Patient 25-49% Sit to Supine: Maximal Assistance - Patient 25-49% Transfers Transfers: Lateral/Scoot Transfers Lateral/Scoot Transfers: Maximal Assistance - Patient 25-49%;Moderate Assistance - Patient 50-74% Transfer (Assistive device):  (slideboard) Locomotion  Gait Ambulation: No Gait Gait: No Stairs / Additional Locomotion Stairs: No Wheelchair Mobility Wheelchair Mobility: No Wheelchair Assistance: Set up Education officer, museum: Both upper extremities Wheelchair Parts Management: Needs assistance  Trunk/Postural Assessment  Cervical Assessment Cervical Assessment: Within Functional Limits Thoracic Assessment Thoracic Assessment: Exceptions to [facility] (TLSO and spinal precautions) Lumbar Assessment Lumbar Assessment: Exceptions to WFL (TLSO and spinal precautions) Postural Control Postural Control: Deficits on evaluation  Balance Balance Balance Assessed: Yes Static Sitting Balance Static Sitting - Balance Support: Feet supported;Bilateral upper extremity supported Static Sitting - Level of Assistance: 4:  Min assist Dynamic Sitting Balance Dynamic Sitting - Balance Support: During functional activity Dynamic Sitting - Level of Assistance: 3: Mod assist Dynamic Sitting Balance - Compensations: maintains UE supported Extremity Assessment  RUE Assessment RUE Assessment: Within Functional Limits LUE Assessment LUE Assessment: Within Functional Limits RLE Assessment RLE Assessment: Exceptions to [facility] Strength Comments: 0/5 grossly LLE Assessment LLE Assessment: Exceptions to [facility] General Strength Comments: 0/5 grossly   [name]  02/12/2023, 9:28 AM

## 2023-02-12 NOTE — Progress Notes (Signed)
Physical Therapy Session Note  Patient Details  Name: Edward Waller MRN: [id number] Date of Birth: May 28, 1932  Today's Date: 02/12/2023 PT Individual Time: 0905-1000 and 1305-1420 PT Individual Time Calculation (min): 55 min and 75 min  Short Term Goals: Week 5:  PT Short Term Goal 1 (Week 5): =LTGs d/t ELOS  Skilled Therapeutic Interventions/Progress Updates: Pt presented in TIS agreeable to therapy. Pt states mild neck pain unrated. Pt transported to ortho gym for energy conservation. Participated in UBE for increased endurance. Pt participated in cycles alternating forward and backwards x 4. Pt with moderate fatigue after activity. Pt then completed Slide board transfer to high/low mat with maxA. Pt then worked on sitting balance unsupported with single UE support. Participated in reaching tasks with pt using support of hand on knee or hand on leg loop as compensatory method to maintain balance. Pt was able to performed x 5 brief releases of BUE but required modA to maintain as would quickly loose balance. Pt completed Slide board transfer with maxA back to w/c. Pt transported back to room at end of session and left with call bell within reach and current needs met.   Tx2: Pt presented in bed agreeable to therapy. Pt denies pain during session. Pt noted to not have shorts o, threaded shorts total A and completed rolling minA to pull shorts over hips. Pt then completed supine to sit with heavy modA and required total A for Slide board set up. Pt completed Slide board transfer with maxA to TIS. Pt then transported to day room and set up in standing frame for spasticity management. Pt completed x 3 sets to standing of 3 min each due to pt's increased fatigue levels this session. Pt worked on improved erect trunk while in frame with pillow placed under pt's elbows. Pt required increased time between bouts for recovery. Pt then worked on UE activities for general conditioning. Pt performed chest  press, forward/backwards circles, and lateral rows x 20 each. Pt propelled back to room and completed Slide board transfer back to bed with maxA and +2 present for safety. PTA removed shoes, leg loops, and TED hose total A. Pt repositioned to comfort and left with call bell within reach and needs met.      Therapy Documentation Precautions:  Precautions Precautions: Back Precaution Booklet Issued: No Precaution Comments: Abdominal binder and thigh High ted hose for BP management for OOB. Required Braces or Orthoses: Spinal Brace Spinal Brace: Thoracolumbosacral orthotic, Applied in sitting position Restrictions Weight Bearing Restrictions: No General:   Vital Signs: Therapy Vitals Temp: 98.5 F (36.9 C) Temp Source: Oral Pulse Rate: 80 Resp: 18 BP: 119/62 Patient Position (if appropriate): Lying Oxygen Therapy SpO2: 97 % O2 Device: Room Air   Therapy/Group: Individual Therapy  Kalynn Declercq 02/12/2023, 4:28 PM

## 2023-02-12 NOTE — Progress Notes (Signed)
Recreational Therapy Discharge Summary Patient Details  Name: Edward Waller MRN: [id number] Date of Birth: [birth year] Today's Date: 02/12/2023  Comments on progress toward goals: Pt is discharging home tomorrow with family to provide 24 hour supervision/assistance. TR sessions focused on pt education, activity analysis/modifications & coping.  Pt also seen informally during co-treats with PT to offer emotional support.   Reasons for discharge: discharge from hospital  Follow-up: Home Health & potential placement in [place] [facility] program Patient/family agrees with progress made and goals achieved: Yes  Edward Waller 02/12/2023, 3:04 PM

## 2023-02-12 NOTE — Progress Notes (Signed)
Occupational Therapy Session Note  Patient Details  Name: LYNWOOD SNOUFFER MRN: [id number] Date of Birth: Jun 21, 1932  Today's Date: 02/12/2023 OT Individual Time: 0347-4259 OT Individual Time Calculation (min): 72 min    Short Term Goals: Week 4:  OT Short Term Goal 1 (Week 4): Pt will don pullover shirt with min A in unsupported sitting OT Short Term Goal 1 - Progress (Week 4): Met OT Short Term Goal 2 (Week 4): [name] transfers with mod A OT Short Term Goal 2 - Progress (Week 4): Progressing toward goal  Skilled Therapeutic Interventions/Progress Updates:    Pt resting in bed upon arrival. OT intervention with focus on bed mobility, sitting balance, UB dressing, SB transfers, and safety awareness. Rolling R/L in bed with min A using bed rails. Dependent for donning pants, Ted hose, and leg loops at bed level. Sitting balance EOB with CGA. UB dressing with min A. SB transfer with max A+2. Pt remained in w/c with all needs within reach.   Therapy Documentation Precautions:  Precautions Precautions: Back Precaution Booklet Issued: No Precaution Comments: Abdominal binder and thigh High ted hose for BP management for OOB. Required Braces or Orthoses: Spinal Brace Spinal Brace: Thoracolumbosacral orthotic, Applied in sitting position Restrictions Weight Bearing Restrictions: No Pain:  Pt reports ongoing BLE spasms; MD aware   Therapy/Group: Individual Therapy  [name] 02/12/2023, 8:14 AM

## 2023-02-12 NOTE — Progress Notes (Addendum)
Patient ID: Edward Waller, male   DOB: 1933/04/10, 89 y.o.   MRN: [id number]  SW spoke with Edward Waller at [employer] [phone] and discussed DME delivered. Delivered- hospital, hoyer lift, drop arm bedside commode, TTB, and 3in1 BSC. Sw discussed [name] needed vs 3in1 BSC. Will order item and will take a few days to receive. Reports no order for standard w/c. *received update from Edward Waller stating commode ordered and should arrive tomorrow, and will call family to discuss delivery.   SW discussed missing w/c item, and she reports will follow-up with prosthetics department since item was ordered last week. Also reports she provided family with contact information for [facility] to discuss specialty w/c eval- [phone] ext. [phone] . Incontinence supplies to be delivered by pharmacy.  [facility] (briefs and chux) and catheters. Sees orders for both. All items are pending delivery at this time.   *SW provided sample catheters until shipment arrives from [place].   [facility] SW-Cassie updated pt loaner w/c (manual w/c) will be delivered to the home tomorrow morning.   SW called pt granddaughter Edward Waller to inform on above.   SW returned phone call to [employer]. with [facility] [phone] ext [phone]/[phone]) who reports their physician asked for updated physician clinicals, and a CD of imaging. No updates on if there will be a bed available at this time. They are aware pt will d/c to  home tomorrow, and a CD of imaging will be sent home with the family.   Cecile Sheerer, MSW, LCSWA Office: [phone] Cell: [phone] Fax: [phone]
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
## 2023-02-12 NOTE — Progress Notes (Signed)
IP Rehab Bowel Program Documentation   Bowel Program Start time [id number]   Dig Stim Indicated? Yes  Dig Stim Prior to Suppository or mini Enema X 1   Output from dig stim: Small  Ordered intervention: Suppository Yes , mini enema No ,   Repeat dig stim after Suppository or Mini enema  X 2,  Output? Small   Bowel Program Complete? No , handoff given yes   Patient Tolerated? Yes   No additional BM after bowel program. Pilar Grammes, RN

## 2023-02-13 MED ORDER — TIZANIDINE HCL 4 MG PO TABS: 4.0000 mg | ORAL_TABLET | Freq: Three times a day (TID) | ORAL | 0 refills | Status: DC

## 2023-02-13 MED ORDER — MIRABEGRON ER 50 MG PO TB24: 50.0000 mg | ORAL_TABLET | Freq: Every day | ORAL | 0 refills | Status: DC

## 2023-02-13 MED ORDER — PAROXETINE HCL 20 MG PO TABS: 20.0000 mg | ORAL_TABLET | Freq: Every day | ORAL | 0 refills | Status: DC

## 2023-02-13 MED ORDER — SIMETHICONE 80 MG PO CHEW: 160.0000 mg | CHEWABLE_TABLET | Freq: Four times a day (QID) | ORAL | 0 refills | Status: DC

## 2023-02-13 MED ORDER — BISACODYL 10 MG RE SUPP: 10.0000 mg | Freq: Every day | RECTAL | 0 refills | Status: AC

## 2023-02-13 MED ORDER — APIXABAN 5 MG PO TABS: 5.0000 mg | ORAL_TABLET | Freq: Two times a day (BID) | ORAL | 0 refills | Status: DC

## 2023-02-13 MED ORDER — BUSPIRONE HCL 10 MG PO TABS: 10.0000 mg | ORAL_TABLET | Freq: Three times a day (TID) | ORAL | 0 refills | Status: DC

## 2023-02-13 MED ORDER — LIDOCAINE 5 % EX PTCH: 2.0000 | MEDICATED_PATCH | CUTANEOUS | 0 refills | Status: DC

## 2023-02-13 MED ORDER — BACLOFEN 15 MG PO TABS: 15.0000 mg | ORAL_TABLET | Freq: Four times a day (QID) | ORAL | 0 refills | Status: DC

## 2023-02-13 MED ORDER — POLYETHYLENE GLYCOL 3350 17 G PO PACK: 34.0000 g | PACK | Freq: Every day | ORAL | 0 refills | Status: AC

## 2023-02-13 MED ORDER — SENNOSIDES-DOCUSATE SODIUM 8.6-50 MG PO TABS: 3.0000 | ORAL_TABLET | Freq: Every day | ORAL | 0 refills | Status: AC

## 2023-02-13 MED ORDER — BISACODYL 10 MG RE SUPP: 10.0000 mg | Freq: Every day | RECTAL | 0 refills | Status: DC
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
## 2023-02-13 NOTE — Progress Notes (Signed)
Inpatient Rehabilitation Discharge Medication Review by a Pharmacist  A complete drug regimen review was completed for this patient to identify any potential clinically significant medication issues.  High Risk Drug Classes Is patient taking? Indication by Medication  Antipsychotic No   Anticoagulant Yes Apixaban - DVT treatment  Antibiotic No   Opioid Yes Oxycodone - PRN pain  Antiplatelet No   Hypoglycemics/insulin No   Vasoactive Medication No   Chemotherapy No   Other Yes Levothyroxine - hypothryoid Baclofen, tizanidine - muscle spasms Bisacodyl, Miralax, Senokot-S - constipation Paroxetine, Buspirone - mood Simethicone - gas relief Lidocaine patch - topical pain relief Mirabegron ER - urinary urgency     Type of Medication Issue Identified Description of Issue Recommendation(s)  Drug Interaction(s) (clinically significant)     Duplicate Therapy     Allergy     No Medication Administration End Date     Incorrect Dose     Additional Drug Therapy Needed     Significant med changes from prior encounter (inform family/care partners about these prior to discharge). Norco, meclizine, methocarbamol, ropinirole, tamsulosin, tramadol, trazodone, Vit D stopped at DC Communicate medication changes with patient/family at discharge  Other       Clinically significant medication issues were identified that warrant physician communication and completion of prescribed/recommended actions by midnight of the next day:  No   Pharmacist comments: n/a   Time spent performing this drug regimen review (minutes): 20   Thank you for allowing pharmacy to be a part of this patient's care.  Thelma Barge, PharmD Clinical Pharmacist

## 2023-02-17 ENCOUNTER — Telehealth: Payer: Self-pay

## 2023-02-17 NOTE — Telephone Encounter (Signed)
Denied today by [facility] NCPDP 2017 The drug you asked for is not listed in your preferred drug list (formulary). The preferred drug(s), you may not have tried are: fesoterodine ER tablet,extended release 24 hr, Myrbetriq tablet,extended release, oxybutynin chloride ER tablet,extended release 24 hr AND tolterodine ER capsule,extended release 24 hr. Your provider needs to give [place] medical reasons why the preferred drug(s) would not work for you and/or would have bad side effects. Sometimes a preferred drug needs more review for approval. Additionally, some preferred drugs listed may be the same drugs with different strengths or forms. [employer] may only require one strength or form of that drug to be tried. This decision was from [employer] Non-Formulary Exceptions Coverage Policy.  Looks like patient has to get brand name

## 2023-02-17 NOTE — Telephone Encounter (Signed)
PA for lidocaine patches submitted

## 2023-02-17 NOTE — Telephone Encounter (Signed)
I asked Rinaldo Cloud to get him Leslye Peer- did she or not? ML

## 2023-02-17 NOTE — Telephone Encounter (Signed)
PA for Mirabegron submitted.

## 2023-02-17 NOTE — Telephone Encounter (Signed)
Approved today by [facility] NCPDP 2017 PA Case: [id number], Status: Approved, Coverage Starts on: 04/22/2022 12:00:00 AM, Coverage Ends on: 04/21/2024 12:00:00 AM. Questions? Contact [phone]. Authorization Expiration Date: 04/20/2024
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
## 2023-02-24 ENCOUNTER — Encounter: Payer: Medicare PPO | Admitting: Registered Nurse

## 2023-05-19 ENCOUNTER — Emergency Department (HOSPITAL_COMMUNITY)
Admission: EM | Admit: 2023-05-19 | Discharge: 2023-05-20 | Disposition: A | Discharge status: Home / Self Care | Payer: No Typology Code available for payment source | Attending: Emergency Medicine | Admitting: Emergency Medicine

## 2023-05-19 ENCOUNTER — Encounter (HOSPITAL_COMMUNITY): Payer: Self-pay

## 2023-05-19 ENCOUNTER — Other Ambulatory Visit: Payer: Self-pay

## 2023-05-19 DIAGNOSIS — N390 Urinary tract infection, site not specified: Secondary | ICD-10-CM

## 2023-05-19 DIAGNOSIS — Z7901 Long term (current) use of anticoagulants: Secondary | ICD-10-CM | POA: Insufficient documentation

## 2023-05-19 DIAGNOSIS — Z79899 Other long term (current) drug therapy: Secondary | ICD-10-CM | POA: Diagnosis not present

## 2023-05-19 DIAGNOSIS — G8220 Paraplegia, unspecified: Secondary | ICD-10-CM | POA: Insufficient documentation

## 2023-05-19 DIAGNOSIS — R109 Unspecified abdominal pain: Secondary | ICD-10-CM | POA: Diagnosis present

## 2023-05-19 LAB — CBC WITH DIFFERENTIAL/PLATELET
Abs Immature Granulocytes: 0.01 10*3/uL (ref 0.00–0.07)
Basophils Absolute: 0 10*3/uL (ref 0.0–0.1)
Basophils Relative: 0 %
Eosinophils Absolute: 0.2 10*3/uL (ref 0.0–0.5)
Eosinophils Relative: 2 %
HCT: 48.2 % (ref 39.0–52.0)
Hemoglobin: 16 g/dL (ref 13.0–17.0)
Immature Granulocytes: 0 %
Lymphocytes Relative: 16 %
Lymphs Abs: 1.4 10*3/uL (ref 0.7–4.0)
MCH: 32.4 pg (ref 26.0–34.0)
MCHC: 33.2 g/dL (ref 30.0–36.0)
MCV: 97.6 fL (ref 80.0–100.0)
Monocytes Absolute: 0.6 10*3/uL (ref 0.1–1.0)
Monocytes Relative: 7 %
Neutro Abs: 6.2 10*3/uL (ref 1.7–7.7)
Neutrophils Relative %: 75 %
Platelets: 226 10*3/uL (ref 150–400)
RBC: 4.94 MIL/uL (ref 4.22–5.81)
RDW: 14.3 % (ref 11.5–15.5)
WBC: 8.4 10*3/uL (ref 4.0–10.5)
nRBC: 0 % (ref 0.0–0.2)

## 2023-05-19 LAB — COMPREHENSIVE METABOLIC PANEL
ALT: 16 U/L (ref 0–44)
AST: 18 U/L (ref 15–41)
Albumin: 4.1 g/dL (ref 3.5–5.0)
Alkaline Phosphatase: 86 U/L (ref 38–126)
Anion gap: 9 (ref 5–15)
BUN: 22 mg/dL (ref 8–23)
CO2: 28 mmol/L (ref 22–32)
Calcium: 9.5 mg/dL (ref 8.9–10.3)
Chloride: 101 mmol/L (ref 98–111)
Creatinine, Ser: 0.45 mg/dL — ABNORMAL LOW (ref 0.61–1.24)
GFR, Estimated: >60 mL/min (ref >60)
Glucose, Bld: 123 mg/dL — ABNORMAL HIGH (ref 70–99)
Potassium: 5 mmol/L (ref 3.5–5.1)
Sodium: 138 mmol/L (ref 135–145)
Total Bilirubin: 0.7 mg/dL (ref 0.0–1.2)
Total Protein: 8.1 g/dL (ref 6.5–8.1)

## 2023-05-19 LAB — URINALYSIS, ROUTINE W REFLEX MICROSCOPIC
Bilirubin Urine: NEGATIVE
Glucose, UA: NEGATIVE mg/dL
Hgb urine dipstick: NEGATIVE
Ketones, ur: NEGATIVE mg/dL
Nitrite: POSITIVE — AB
Protein, ur: NEGATIVE mg/dL
Specific Gravity, Urine: 1.011 (ref 1.005–1.030)
WBC, UA: >50 WBC/hpf (ref 0–5)
pH: 5 (ref 5.0–8.0)

## 2023-05-19 LAB — LIPASE, BLOOD: Lipase: 26 U/L (ref 11–51)

## 2023-05-19 MED ORDER — SODIUM CHLORIDE 0.9 % IV SOLN
1.0000 g | Freq: Once | INTRAVENOUS | Status: AC
  Administered 2023-05-19: 1 g via INTRAVENOUS
  Filled 2023-05-19: qty 10

## 2023-05-19 MED ORDER — CEPHALEXIN 500 MG PO CAPS: 500.0000 mg | ORAL_CAPSULE | Freq: Four times a day (QID) | ORAL | 0 refills | Status: DC

## 2023-05-19 NOTE — Discharge Instructions (Addendum)
You have been evaluated for your symptoms.  You have been diagnosed with a lower urinary tract infection.  Please take antibiotic as prescribed for the full duration.  Follow-up closely with your doctor for further care.  If you develop fever, nausea and vomiting, or worsening symptoms please return

## 2023-05-19 NOTE — ED Notes (Signed)
Provider at bedside speaking with patient.

## 2023-05-19 NOTE — ED Notes (Addendum)
[facility] Contacted for patient to be transferred back home
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
## 2023-05-19 NOTE — ED Triage Notes (Signed)
Pt to ED by EMS from home c/o possible UTI and LLQ abd pain. Pt is paraplegic, however he is endorsing abdominal pain. Pt's wife performs in and out cath every 4 hours and reports that the last time she did it this evening, there was of "creamy liquid" that came out and she is concerned for infection. Arrives A+O, VSS, NADN.

## 2023-05-20 NOTE — ED Notes (Signed)
Patient transferred home with [facility] at this time. Daughter should be home waiting for patient to get there as she informed staff of when she left bedside

## 2023-05-22 LAB — URINE CULTURE: Culture: >=100000 — AB

## 2023-05-23 ENCOUNTER — Telehealth (HOSPITAL_BASED_OUTPATIENT_CLINIC_OR_DEPARTMENT_OTHER): Payer: Self-pay | Admitting: Emergency Medicine

## 2023-05-23 NOTE — Telephone Encounter (Signed)
Post ED Visit - Positive Culture Follow-up  Culture report reviewed by antimicrobial stewardship pharmacist: [facility] []  Nathan Batchelder, [place].D. []  Celedonio Miyamoto, Pharm.D., BCPS AQ-ID []  Garvin Fila, Pharm.D., BCPS [x]  Georgina Pillion, [address].D., BCPS []  [place], [place].D., BCPS, AAHIVP []  Estella Husk, Pharm.D., BCPS, AAHIVP []  Lysle Pearl, PharmD, BCPS []  Phillips Climes, PharmD, BCPS []  Agapito Games, PharmD, BCPS []  Verlan Friends, PharmD []  Mervyn Gay, PharmD, BCPS []  Vinnie Level, PharmD  [facility] []  Len Childs, PharmD []  Greer Pickerel, PharmD []  Adalberto Cole, PharmD []  Perlie Gold, Rph []  Lonell Face) Jean Rosenthal, PharmD []  Earl Many, PharmD []  Junita Push, PharmD []  Dorna Leitz, PharmD []  Terrilee Files, PharmD []  Lynann Beaver, PharmD []  Keturah Barre, PharmD []  Loralee Pacas, PharmD []  Bernadene Person, PharmD   Positive urine culture Treated with Cephalexin, organism sensitive to the same and no further patient follow-up is required at this time.  Pamala Hurry 05/23/2023, 12:41 PM

## 2023-07-20 ENCOUNTER — Emergency Department (HOSPITAL_COMMUNITY)

## 2023-07-20 ENCOUNTER — Inpatient Hospital Stay (HOSPITAL_COMMUNITY)
Admission: EM | Admit: 2023-07-20 | Discharge: 2023-07-26 | DRG: 853 | Disposition: A | Discharge status: Home Health | Attending: Internal Medicine | Admitting: Internal Medicine

## 2023-07-20 ENCOUNTER — Other Ambulatory Visit: Payer: Self-pay

## 2023-07-20 ENCOUNTER — Encounter (HOSPITAL_COMMUNITY): Payer: Self-pay | Admitting: Family Medicine

## 2023-07-20 DIAGNOSIS — R4182 Altered mental status, unspecified: Secondary | ICD-10-CM | POA: Diagnosis present

## 2023-07-20 DIAGNOSIS — N111 Chronic obstructive pyelonephritis: Principal | ICD-10-CM | POA: Diagnosis present

## 2023-07-20 DIAGNOSIS — E119 Type 2 diabetes mellitus without complications: Secondary | ICD-10-CM

## 2023-07-20 DIAGNOSIS — R6521 Severe sepsis with septic shock: Secondary | ICD-10-CM | POA: Diagnosis present

## 2023-07-20 DIAGNOSIS — Z86718 Personal history of other venous thrombosis and embolism: Secondary | ICD-10-CM

## 2023-07-20 DIAGNOSIS — G4733 Obstructive sleep apnea (adult) (pediatric): Secondary | ICD-10-CM | POA: Diagnosis not present

## 2023-07-20 DIAGNOSIS — I214 Non-ST elevation (NSTEMI) myocardial infarction: Secondary | ICD-10-CM | POA: Diagnosis not present

## 2023-07-20 DIAGNOSIS — E871 Hypo-osmolality and hyponatremia: Secondary | ICD-10-CM | POA: Diagnosis present

## 2023-07-20 DIAGNOSIS — G2581 Restless legs syndrome: Secondary | ICD-10-CM | POA: Diagnosis present

## 2023-07-20 DIAGNOSIS — R410 Disorientation, unspecified: Secondary | ICD-10-CM

## 2023-07-20 DIAGNOSIS — Z85828 Personal history of other malignant neoplasm of skin: Secondary | ICD-10-CM | POA: Diagnosis not present

## 2023-07-20 DIAGNOSIS — I2489 Other forms of acute ischemic heart disease: Secondary | ICD-10-CM | POA: Diagnosis present

## 2023-07-20 DIAGNOSIS — N390 Urinary tract infection, site not specified: Secondary | ICD-10-CM

## 2023-07-20 DIAGNOSIS — A4152 Sepsis due to Pseudomonas: Principal | ICD-10-CM | POA: Diagnosis present

## 2023-07-20 DIAGNOSIS — A419 Sepsis, unspecified organism: Secondary | ICD-10-CM

## 2023-07-20 DIAGNOSIS — L89151 Pressure ulcer of sacral region, stage 1: Secondary | ICD-10-CM | POA: Diagnosis present

## 2023-07-20 DIAGNOSIS — Z7984 Long term (current) use of oral hypoglycemic drugs: Secondary | ICD-10-CM

## 2023-07-20 DIAGNOSIS — N400 Enlarged prostate without lower urinary tract symptoms: Secondary | ICD-10-CM | POA: Diagnosis present

## 2023-07-20 DIAGNOSIS — Z7901 Long term (current) use of anticoagulants: Secondary | ICD-10-CM

## 2023-07-20 DIAGNOSIS — I2510 Atherosclerotic heart disease of native coronary artery without angina pectoris: Secondary | ICD-10-CM | POA: Diagnosis present

## 2023-07-20 DIAGNOSIS — Z885 Allergy status to narcotic agent status: Secondary | ICD-10-CM

## 2023-07-20 DIAGNOSIS — F4320 Adjustment disorder, unspecified: Secondary | ICD-10-CM | POA: Diagnosis present

## 2023-07-20 DIAGNOSIS — E039 Hypothyroidism, unspecified: Secondary | ICD-10-CM | POA: Diagnosis present

## 2023-07-20 DIAGNOSIS — G9341 Metabolic encephalopathy: Secondary | ICD-10-CM | POA: Diagnosis present

## 2023-07-20 DIAGNOSIS — N319 Neuromuscular dysfunction of bladder, unspecified: Secondary | ICD-10-CM

## 2023-07-20 DIAGNOSIS — R7989 Other specified abnormal findings of blood chemistry: Secondary | ICD-10-CM | POA: Diagnosis not present

## 2023-07-20 DIAGNOSIS — B965 Pseudomonas (aeruginosa) (mallei) (pseudomallei) as the cause of diseases classified elsewhere: Secondary | ICD-10-CM | POA: Diagnosis present

## 2023-07-20 DIAGNOSIS — Z79899 Other long term (current) drug therapy: Secondary | ICD-10-CM

## 2023-07-20 DIAGNOSIS — A4189 Other specified sepsis: Secondary | ICD-10-CM | POA: Diagnosis not present

## 2023-07-20 DIAGNOSIS — N179 Acute kidney failure, unspecified: Secondary | ICD-10-CM | POA: Diagnosis present

## 2023-07-20 DIAGNOSIS — I959 Hypotension, unspecified: Secondary | ICD-10-CM | POA: Diagnosis not present

## 2023-07-20 DIAGNOSIS — G8221 Paraplegia, complete: Secondary | ICD-10-CM | POA: Diagnosis present

## 2023-07-20 DIAGNOSIS — R079 Chest pain, unspecified: Secondary | ICD-10-CM | POA: Diagnosis not present

## 2023-07-20 DIAGNOSIS — Z79890 Hormone replacement therapy (postmenopausal): Secondary | ICD-10-CM | POA: Diagnosis not present

## 2023-07-20 DIAGNOSIS — G4730 Sleep apnea, unspecified: Secondary | ICD-10-CM | POA: Diagnosis present

## 2023-07-20 DIAGNOSIS — M1990 Unspecified osteoarthritis, unspecified site: Secondary | ICD-10-CM | POA: Diagnosis present

## 2023-07-20 DIAGNOSIS — N201 Calculus of ureter: Secondary | ICD-10-CM | POA: Diagnosis present

## 2023-07-20 DIAGNOSIS — N136 Pyonephrosis: Secondary | ICD-10-CM | POA: Diagnosis present

## 2023-07-20 DIAGNOSIS — Z1619 Resistance to other specified beta lactam antibiotics: Secondary | ICD-10-CM | POA: Diagnosis present

## 2023-07-20 DIAGNOSIS — G8222 Paraplegia, incomplete: Secondary | ICD-10-CM | POA: Diagnosis present

## 2023-07-20 DIAGNOSIS — Z96641 Presence of right artificial hip joint: Secondary | ICD-10-CM | POA: Diagnosis present

## 2023-07-20 DIAGNOSIS — E872 Acidosis, unspecified: Secondary | ICD-10-CM | POA: Diagnosis present

## 2023-07-20 DIAGNOSIS — N132 Hydronephrosis with renal and ureteral calculous obstruction: Principal | ICD-10-CM

## 2023-07-20 HISTORY — DX: Sleep apnea, unspecified: G47.30

## 2023-07-20 LAB — CBC WITH DIFFERENTIAL/PLATELET
Abs Immature Granulocytes: 0.09 10*3/uL — ABNORMAL HIGH (ref 0.00–0.07)
Basophils Absolute: 0 10*3/uL (ref 0.0–0.1)
Basophils Relative: 0 %
Eosinophils Absolute: 0 10*3/uL (ref 0.0–0.5)
Eosinophils Relative: 0 %
HCT: 47 % (ref 39.0–52.0)
Hemoglobin: 15.6 g/dL (ref 13.0–17.0)
Immature Granulocytes: 1 %
Lymphocytes Relative: 6 %
Lymphs Abs: 0.9 10*3/uL (ref 0.7–4.0)
MCH: 32.1 pg (ref 26.0–34.0)
MCHC: 33.2 g/dL (ref 30.0–36.0)
MCV: 96.7 fL (ref 80.0–100.0)
Monocytes Absolute: 0.8 10*3/uL (ref 0.1–1.0)
Monocytes Relative: 6 %
Neutro Abs: 13.1 10*3/uL — ABNORMAL HIGH (ref 1.7–7.7)
Neutrophils Relative %: 87 %
Platelets: 184 10*3/uL (ref 150–400)
RBC: 4.86 MIL/uL (ref 4.22–5.81)
RDW: 13.6 % (ref 11.5–15.5)
WBC: 15 10*3/uL — ABNORMAL HIGH (ref 4.0–10.5)
nRBC: 0 % (ref 0.0–0.2)

## 2023-07-20 LAB — URINALYSIS, W/ REFLEX TO CULTURE (INFECTION SUSPECTED)
Bilirubin Urine: NEGATIVE
Glucose, UA: 150 mg/dL — AB
Ketones, ur: NEGATIVE mg/dL
Nitrite: NEGATIVE
Protein, ur: NEGATIVE mg/dL
Specific Gravity, Urine: 1.014 (ref 1.005–1.030)
WBC, UA: >50 WBC/hpf (ref 0–5)
pH: 5 (ref 5.0–8.0)

## 2023-07-20 LAB — COMPREHENSIVE METABOLIC PANEL WITH GFR
ALT: 11 U/L (ref 0–44)
AST: 16 U/L (ref 15–41)
Albumin: 3.6 g/dL (ref 3.5–5.0)
Alkaline Phosphatase: 87 U/L (ref 38–126)
Anion gap: 9 (ref 5–15)
BUN: 23 mg/dL (ref 8–23)
CO2: 26 mmol/L (ref 22–32)
Calcium: 9.1 mg/dL (ref 8.9–10.3)
Chloride: 99 mmol/L (ref 98–111)
Creatinine, Ser: 1.16 mg/dL (ref 0.61–1.24)
GFR, Estimated: >60 mL/min (ref >60)
Glucose, Bld: 188 mg/dL — ABNORMAL HIGH (ref 70–99)
Potassium: 4.4 mmol/L (ref 3.5–5.1)
Sodium: 134 mmol/L — ABNORMAL LOW (ref 135–145)
Total Bilirubin: 1.2 mg/dL (ref 0.0–1.2)
Total Protein: 7.7 g/dL (ref 6.5–8.1)

## 2023-07-20 LAB — GLUCOSE, CAPILLARY: Glucose-Capillary: 170 mg/dL — ABNORMAL HIGH (ref 70–99)

## 2023-07-20 LAB — LIPASE, BLOOD: Lipase: 23 U/L (ref 11–51)

## 2023-07-20 LAB — RESP PANEL BY RT-PCR (RSV, FLU A&B, COVID)  RVPGX2
Influenza A by PCR: NEGATIVE
Influenza B by PCR: NEGATIVE
Resp Syncytial Virus by PCR: NEGATIVE
SARS Coronavirus 2 by RT PCR: NEGATIVE

## 2023-07-20 LAB — TROPONIN I (HIGH SENSITIVITY): Troponin I (High Sensitivity): 13 ng/L (ref <18)

## 2023-07-20 LAB — I-STAT CG4 LACTIC ACID, ED: Lactic Acid, Venous: 1.9 mmol/L (ref 0.5–1.9)

## 2023-07-20 MED ORDER — CEFTRIAXONE SODIUM 2 G IJ SOLR: 2.0000 g | INTRAMUSCULAR | Status: DC

## 2023-07-20 MED ORDER — SODIUM CHLORIDE 0.9 % IV SOLN: INTRAVENOUS | Status: DC

## 2023-07-20 MED ORDER — IOHEXOL 300 MG/ML  SOLN
100.0000 mL | Freq: Once | INTRAMUSCULAR | Status: AC | PRN
  Administered 2023-07-20: 100 mL via INTRAVENOUS

## 2023-07-20 MED ORDER — BACLOFEN 10 MG PO TABS
15.0000 mg | ORAL_TABLET | Freq: Once | ORAL | Status: AC
  Administered 2023-07-20: 15 mg via ORAL
  Filled 2023-07-20: qty 2

## 2023-07-20 MED ORDER — OXYCODONE HCL 5 MG PO TABS: 2.5000 mg | ORAL_TABLET | ORAL | Status: DC | PRN

## 2023-07-20 MED ORDER — ONDANSETRON HCL 4 MG PO TABS: 4.0000 mg | ORAL_TABLET | Freq: Four times a day (QID) | ORAL | Status: DC | PRN

## 2023-07-20 MED ORDER — BACLOFEN 10 MG PO TABS: 15.0000 mg | ORAL_TABLET | Freq: Every day | ORAL | Status: DC

## 2023-07-20 MED ORDER — FENTANYL CITRATE PF 50 MCG/ML IJ SOSY
12.5000 ug | PREFILLED_SYRINGE | INTRAMUSCULAR | Status: DC | PRN
  Administered 2023-07-20: 50 ug via INTRAVENOUS
  Administered 2023-07-21 (×2): 25 ug via INTRAVENOUS
  Filled 2023-07-20 (×4): qty 1

## 2023-07-20 MED ORDER — SODIUM CHLORIDE 0.9 % IV BOLUS
1000.0000 mL | Freq: Once | INTRAVENOUS | Status: AC
  Administered 2023-07-20: 1000 mL via INTRAVENOUS

## 2023-07-20 MED ORDER — ACETAMINOPHEN 650 MG RE SUPP
650.0000 mg | Freq: Four times a day (QID) | RECTAL | Status: DC | PRN
  Filled 2023-07-20: qty 1

## 2023-07-20 MED ORDER — ACETAMINOPHEN 10 MG/ML IV SOLN
1000.0000 mg | Freq: Once | INTRAVENOUS | Status: AC
  Administered 2023-07-20: 1000 mg via INTRAVENOUS
  Filled 2023-07-20: qty 100

## 2023-07-20 MED ORDER — SODIUM CHLORIDE 0.9% FLUSH
3.0000 mL | Freq: Two times a day (BID) | INTRAVENOUS | Status: DC
  Administered 2023-07-20 – 2023-07-25 (×11): 3 mL via INTRAVENOUS

## 2023-07-20 MED ORDER — SENNOSIDES-DOCUSATE SODIUM 8.6-50 MG PO TABS
1.0000 | ORAL_TABLET | Freq: Every evening | ORAL | Status: DC | PRN
  Administered 2023-07-25: 1 via ORAL
  Filled 2023-07-20: qty 1

## 2023-07-20 MED ORDER — LORAZEPAM 0.5 MG PO TABS
0.2500 mg | ORAL_TABLET | Freq: Once | ORAL | Status: AC | PRN
  Administered 2023-07-20: 0.25 mg via ORAL
  Filled 2023-07-20: qty 1

## 2023-07-20 MED ORDER — ACETAMINOPHEN 325 MG PO TABS
650.0000 mg | ORAL_TABLET | Freq: Four times a day (QID) | ORAL | Status: DC | PRN
  Administered 2023-07-23 – 2023-07-24 (×3): 650 mg via ORAL
  Filled 2023-07-20 (×3): qty 2

## 2023-07-20 MED ORDER — INSULIN ASPART 100 UNIT/ML IJ SOLN
0.0000 [IU] | INTRAMUSCULAR | Status: DC
  Administered 2023-07-21 (×3): 1 [IU] via SUBCUTANEOUS
  Filled 2023-07-20: qty 0.06

## 2023-07-20 MED ORDER — SODIUM CHLORIDE 0.9 % IV SOLN
2.0000 g | Freq: Once | INTRAVENOUS | Status: AC
  Administered 2023-07-20: 2 g via INTRAVENOUS
  Filled 2023-07-20: qty 20

## 2023-07-20 MED ORDER — ONDANSETRON HCL 4 MG/2ML IJ SOLN
4.0000 mg | Freq: Four times a day (QID) | INTRAMUSCULAR | Status: DC | PRN
  Administered 2023-07-20: 4 mg via INTRAVENOUS
  Filled 2023-07-20: qty 2

## 2023-07-20 MED ORDER — LEVOTHYROXINE SODIUM 50 MCG PO TABS
50.0000 ug | ORAL_TABLET | Freq: Every day | ORAL | Status: DC
Start: 2023-07-21 — End: 2023-07-26
  Administered 2023-07-21 – 2023-07-26 (×6): 50 ug via ORAL
  Filled 2023-07-20: qty 1
  Filled 2023-07-20: qty 2
  Filled 2023-07-20 (×3): qty 1
  Filled 2023-07-20: qty 2

## 2023-07-20 NOTE — ED Provider Notes (Signed)
 [place] EMERGENCY DEPARTMENT AT [facility] Provider Note   CSN: [id number] Arrival date & time: 07/20/23  1747     History {Add pertinent medical, surgical, social history, OB history to HPI:1} Chief Complaint  Patient presents with   Altered Mental Status    Edward Waller is a 89 y.o. male.  He has a history of paraplegia and has to self cath, nonambulatory.  He was having some increased sediment in his urine and the doctors at the [place] put him on an antibiotic.  He has 1 more dose to take but they are not sure what the name is.  Since last night he has been running fever and today he seemed more confused.  Having difficulty operating remote control for the television.  Also thinks he might of fallen although he cannot stand.  They have also noticed that his abdomen is more distended although sometimes this happens when he has gas.  He also noticed a little bit of a rash on his abdomen and face.  He denies any abdominal pain but he does feel little distended.  No chest pain or shortness of breath.  Does endorse some pain in his left arm that he attributes to getting an IV stick there a few weeks ago.  The history is provided by the patient, the spouse and a relative.  Altered Mental Status Presenting symptoms: confusion   Most recent episode:  Today Timing:  Intermittent Progression:  Unchanged Chronicity:  Recurrent Context: recent infection   Associated symptoms: fever   Associated symptoms: no abdominal pain, no nausea and no vomiting        Home Medications Prior to Admission medications   Medication Sig Start Date End Date Taking? Authorizing Provider  acetaminophen (TYLENOL) 500 MG tablet Take 500 mg by mouth every 6 (six) hours as needed for moderate pain.    [provider]  apixaban (ELIQUIS) 5 MG TABS tablet Take 1 tablet (5 mg total) by mouth 2 (two) times daily. 02/13/23   Love, Evlyn Kanner, PA-C  Baclofen 15 MG TABS Take 15 mg by mouth QID. At 8  am, 2 pm, 9 pm and 2 am 02/13/23   Love, Evlyn Kanner, PA-C  bisacodyl (DULCOLAX) 10 MG suppository Place 1 suppository (10 mg total) rectally daily after supper. 02/13/23   Love, Evlyn Kanner, PA-C  busPIRone (BUSPAR) 10 MG tablet Take 1 tablet (10 mg total) by mouth 3 (three) times daily. 02/13/23   Love, Evlyn Kanner, PA-C  cephALEXin (KEFLEX) 500 MG capsule Take 1 capsule (500 mg total) by mouth 4 (four) times daily. 05/19/23   Fayrene Helper, PA-C  feeding supplement (ENSURE ENLIVE / ENSURE PLUS) LIQD Take 237 mLs by mouth 3 (three) times daily between meals. 01/30/23   Love, Evlyn Kanner, PA-C  levothyroxine (SYNTHROID) 50 MCG tablet Take 50 mcg by mouth daily before breakfast.    [provider]  lidocaine (LIDODERM) 5 % Place 2 patches onto the skin daily. Remove & Discard patch within 12 hours or as directed by MD 02/13/23   Love, Evlyn Kanner, PA-C  melatonin 3 MG TABS tablet Take 3 mg by mouth at bedtime.    [provider]  mirabegron ER (MYRBETRIQ) 50 MG TB24 tablet Take 1 tablet (50 mg total) by mouth daily. 02/13/23   Love, Evlyn Kanner, PA-C  Multiple Vitamin (MULTIVITAMIN WITH MINERALS) TABS tablet Take 1 tablet by mouth every morning.    [provider]  PARoxetine (PAXIL) 20 MG  tablet Take 1 tablet (20 mg total) by mouth at bedtime. 02/13/23   Love, Evlyn Kanner, PA-C  polyethylene glycol (MIRALAX / GLYCOLAX) 17 g packet Take 34 g by mouth daily. Mix in 8 ounces a fluid per day 02/13/23   Jacquelynn Cree, PA-C  polyvinyl alcohol (LIQUIFILM TEARS) 1.4 % ophthalmic solution Place 1 drop into the right eye as needed for dry eyes (put at bedside for pt). Patient not taking: Reported on 03/07/2022 07/24/21   Love, Evlyn Kanner, PA-C  senna-docusate (SENOKOT-S) 8.6-50 MG tablet Take 3 tablets by mouth daily at 6 (six) AM. 02/13/23   Love, Evlyn Kanner, PA-C  simethicone (MYLICON) 80 MG chewable tablet Chew 2 tablets (160 mg total) by mouth 4 (four) times daily. 02/13/23   Love, Evlyn Kanner, PA-C   tiZANidine (ZANAFLEX) 4 MG tablet Take 1 tablet (4 mg total) by mouth 3 (three) times daily. 02/13/23   Love, Evlyn Kanner, PA-C      Allergies    Trazodone and nefazodone    Review of Systems   Review of Systems  Constitutional:  Positive for fever.  Respiratory:  Negative for shortness of breath.   Cardiovascular:  Negative for chest pain.  Gastrointestinal:  Negative for abdominal pain, nausea and vomiting.  Genitourinary:  Positive for difficulty urinating. Negative for dysuria.  Psychiatric/Behavioral:  Positive for confusion.     Physical Exam Updated Vital Signs BP 126/66   Pulse 98   Temp 98.6 F (37 C) (Oral)   Resp 18   Ht 6' (1.829 m)   Wt 77.1 kg   SpO2 98%   BMI 23.06 kg/m  Physical Exam Vitals and nursing note reviewed.  Constitutional:      General: He is not in acute distress.    Appearance: Normal appearance. He is well-developed.  HENT:     Head: Normocephalic and atraumatic.  Eyes:     Conjunctiva/sclera: Conjunctivae normal.  Cardiovascular:     Rate and Rhythm: Normal rate and regular rhythm.     Heart sounds: No murmur heard. Pulmonary:     Effort: Pulmonary effort is normal. No respiratory distress.     Breath sounds: Normal breath sounds.  Abdominal:     General: There is distension.     Palpations: Abdomen is soft.     Tenderness: There is no abdominal tenderness. There is no guarding or rebound.  Musculoskeletal:        General: No deformity.     Cervical back: Neck supple.  Skin:    General: Skin is warm and dry.     Capillary Refill: Capillary refill takes less than 2 seconds.  Neurological:     Mental Status: He is alert.     Sensory: Sensory deficit present.     Motor: Weakness present.     Comments: He has baseline decreased sensation and motor function of his lower abdomen and legs.     ED Results / Procedures / Treatments   Labs (all labs ordered are listed, but only abnormal results are displayed) Labs Reviewed - No data  to display  EKG None  Radiology No results found.  Procedures Procedures  {Document cardiac monitor, telemetry assessment procedure when appropriate:1}  Medications Ordered in ED Medications - No data to display  ED Course/ Medical Decision Making/ A&P   {   Click here for ABCD2, HEART and other calculatorsREFRESH Note before signing :1}  Medical Decision Making Amount and/or Complexity of Data Reviewed Labs: ordered. Radiology: ordered.   This patient complains of ***; this involves an extensive number of treatment Options and is a complaint that carries with it a high risk of complications and morbidity. The differential includes ***  I ordered, reviewed and interpreted labs, which included *** I ordered medication *** and reviewed PMP when indicated. I ordered imaging studies which included *** and I independently    visualized and interpreted imaging which showed *** Additional history obtained from *** Previous records obtained and reviewed *** I consulted *** and discussed lab and imaging findings and discussed disposition.  Cardiac monitoring reviewed, *** Social determinants considered, *** Critical Interventions: ***  After the interventions stated above, I reevaluated the patient and found *** Admission and further testing considered, ***   {Document critical care time when appropriate:1} {Document review of labs and clinical decision tools ie heart score, Chads2Vasc2 etc:1}  {Document your independent review of radiology images, and any outside records:1} {Document your discussion with family members, caretakers, and with consultants:1} {Document social determinants of health affecting pt's care:1} {Document your decision making why or why not admission, treatments were needed:1} Final Clinical Impression(s) / ED Diagnoses Final diagnoses:  None    Rx / DC Orders ED Discharge Orders     None

## 2023-07-20 NOTE — ED Triage Notes (Signed)
 Pt sent from home for possible UTI. Pt was trying to use his remote and became a little confused. Pt currently being treated for UTI and today is his last dose of antibiotics. Pt's spouse feels that his UTI is getting worse because of his one episode of confusion. Pt is alert and oriented x4 and speech is clear. Pt states that he is feeling fine. Pt mentioned that he in and out caths himself every 4 hours. Pt asking about possibly leaving one in.
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
## 2023-07-20 NOTE — ED Notes (Signed)
 Pt wife informed EMT-P that pt was having restless leg. Wife stated that pt normally took baclofen at this time for such. Provider informed.

## 2023-07-20 NOTE — ED Notes (Signed)
 Patient transported to CT

## 2023-07-21 ENCOUNTER — Inpatient Hospital Stay (HOSPITAL_COMMUNITY): Admitting: Certified Registered Nurse Anesthetist

## 2023-07-21 ENCOUNTER — Inpatient Hospital Stay (HOSPITAL_COMMUNITY)

## 2023-07-21 ENCOUNTER — Encounter (HOSPITAL_COMMUNITY)
Admission: EM | Disposition: A | Discharge status: Home Health | Payer: Self-pay | Source: Home / Self Care | Attending: Internal Medicine

## 2023-07-21 ENCOUNTER — Encounter (HOSPITAL_COMMUNITY): Payer: Self-pay | Admitting: Family Medicine

## 2023-07-21 DIAGNOSIS — I959 Hypotension, unspecified: Secondary | ICD-10-CM

## 2023-07-21 DIAGNOSIS — R6521 Severe sepsis with septic shock: Secondary | ICD-10-CM

## 2023-07-21 DIAGNOSIS — N201 Calculus of ureter: Secondary | ICD-10-CM | POA: Diagnosis not present

## 2023-07-21 DIAGNOSIS — N390 Urinary tract infection, site not specified: Secondary | ICD-10-CM

## 2023-07-21 DIAGNOSIS — N319 Neuromuscular dysfunction of bladder, unspecified: Secondary | ICD-10-CM

## 2023-07-21 DIAGNOSIS — E119 Type 2 diabetes mellitus without complications: Secondary | ICD-10-CM | POA: Diagnosis not present

## 2023-07-21 DIAGNOSIS — Z86718 Personal history of other venous thrombosis and embolism: Secondary | ICD-10-CM

## 2023-07-21 DIAGNOSIS — A4189 Other specified sepsis: Secondary | ICD-10-CM | POA: Diagnosis not present

## 2023-07-21 DIAGNOSIS — I214 Non-ST elevation (NSTEMI) myocardial infarction: Secondary | ICD-10-CM | POA: Diagnosis not present

## 2023-07-21 DIAGNOSIS — R079 Chest pain, unspecified: Secondary | ICD-10-CM | POA: Diagnosis not present

## 2023-07-21 DIAGNOSIS — N111 Chronic obstructive pyelonephritis: Secondary | ICD-10-CM | POA: Diagnosis not present

## 2023-07-21 DIAGNOSIS — R7989 Other specified abnormal findings of blood chemistry: Secondary | ICD-10-CM | POA: Diagnosis not present

## 2023-07-21 DIAGNOSIS — A419 Sepsis, unspecified organism: Secondary | ICD-10-CM | POA: Diagnosis not present

## 2023-07-21 DIAGNOSIS — E039 Hypothyroidism, unspecified: Secondary | ICD-10-CM

## 2023-07-21 DIAGNOSIS — G8221 Paraplegia, complete: Secondary | ICD-10-CM | POA: Diagnosis not present

## 2023-07-21 DIAGNOSIS — G4733 Obstructive sleep apnea (adult) (pediatric): Secondary | ICD-10-CM

## 2023-07-21 DIAGNOSIS — N179 Acute kidney failure, unspecified: Secondary | ICD-10-CM

## 2023-07-21 HISTORY — PX: CYSTOSCOPY W/ URETERAL STENT PLACEMENT: SHX1429

## 2023-07-21 LAB — BASIC METABOLIC PANEL WITH GFR
Anion gap: 11 (ref 5–15)
BUN: 22 mg/dL (ref 8–23)
CO2: 19 mmol/L — ABNORMAL LOW (ref 22–32)
Calcium: 8.6 mg/dL — ABNORMAL LOW (ref 8.9–10.3)
Chloride: 103 mmol/L (ref 98–111)
Creatinine, Ser: 1.25 mg/dL — ABNORMAL HIGH (ref 0.61–1.24)
GFR, Estimated: 55 mL/min — ABNORMAL LOW (ref >60)
Glucose, Bld: 139 mg/dL — ABNORMAL HIGH (ref 70–99)
Potassium: 4.4 mmol/L (ref 3.5–5.1)
Sodium: 133 mmol/L — ABNORMAL LOW (ref 135–145)

## 2023-07-21 LAB — CBC
HCT: 44.8 % (ref 39.0–52.0)
Hemoglobin: 14.3 g/dL (ref 13.0–17.0)
MCH: 32.7 pg (ref 26.0–34.0)
MCHC: 31.9 g/dL (ref 30.0–36.0)
MCV: 102.5 fL — ABNORMAL HIGH (ref 80.0–100.0)
Platelets: 128 10*3/uL — ABNORMAL LOW (ref 150–400)
RBC: 4.37 MIL/uL (ref 4.22–5.81)
RDW: 14.1 % (ref 11.5–15.5)
WBC: 15.3 10*3/uL — ABNORMAL HIGH (ref 4.0–10.5)
nRBC: 0 % (ref 0.0–0.2)

## 2023-07-21 LAB — TROPONIN I (HIGH SENSITIVITY)
Troponin I (High Sensitivity): 384 ng/L (ref <18)
Troponin I (High Sensitivity): 606 ng/L (ref <18)
Troponin I (High Sensitivity): 772 ng/L (ref <18)
Troponin I (High Sensitivity): 737 ng/L (ref <18)

## 2023-07-21 LAB — ECHOCARDIOGRAM COMPLETE
Area-P 1/2: 3.88 cm2
Calc EF: 55.6 %
Height: 72 in
S' Lateral: 3.8 cm
Single Plane A2C EF: 57.4 %
Single Plane A4C EF: 51.6 %
Weight: 2931.24 [oz_av]

## 2023-07-21 LAB — BLOOD CULTURE ID PANEL (REFLEXED) - BCID2

## 2023-07-21 LAB — LIPID PANEL
Cholesterol: 98 mg/dL (ref 0–200)
HDL: 41 mg/dL (ref >40)
LDL Cholesterol: 40 mg/dL (ref 0–99)
Total CHOL/HDL Ratio: 2.4 ratio
Triglycerides: 84 mg/dL (ref <150)
VLDL: 17 mg/dL (ref 0–40)

## 2023-07-21 LAB — GLUCOSE, CAPILLARY
Glucose-Capillary: 116 mg/dL — ABNORMAL HIGH (ref 70–99)
Glucose-Capillary: 154 mg/dL — ABNORMAL HIGH (ref 70–99)
Glucose-Capillary: 175 mg/dL — ABNORMAL HIGH (ref 70–99)
Glucose-Capillary: 163 mg/dL — ABNORMAL HIGH (ref 70–99)
Glucose-Capillary: 194 mg/dL — ABNORMAL HIGH (ref 70–99)

## 2023-07-21 LAB — HEMOGLOBIN A1C
Hgb A1c MFr Bld: 6.5 % — ABNORMAL HIGH (ref 4.8–5.6)
Mean Plasma Glucose: 139.85 mg/dL

## 2023-07-21 LAB — MRSA NEXT GEN BY PCR, NASAL: MRSA by PCR Next Gen: NOT DETECTED

## 2023-07-21 LAB — TSH: TSH: 0.945 u[IU]/mL (ref 0.350–4.500)

## 2023-07-21 LAB — CORTISOL: Cortisol, Plasma: 11.9 ug/dL

## 2023-07-21 SURGERY — CYSTOSCOPY, WITH RETROGRADE PYELOGRAM AND URETERAL STENT INSERTION
Anesthesia: General | Site: Pelvis | Laterality: Right

## 2023-07-21 MED ORDER — LACTATED RINGERS IV SOLN: INTRAVENOUS | Status: DC

## 2023-07-21 MED ORDER — PROPOFOL 10 MG/ML IV BOLUS
INTRAVENOUS | Status: DC | PRN
  Administered 2023-07-21: 100 mg via INTRAVENOUS

## 2023-07-21 MED ORDER — ONDANSETRON HCL 4 MG/2ML IJ SOLN
INTRAMUSCULAR | Status: DC | PRN
  Administered 2023-07-21: 4 mg via INTRAVENOUS

## 2023-07-21 MED ORDER — PROPOFOL 10 MG/ML IV BOLUS
INTRAVENOUS | Status: AC
  Filled 2023-07-21: qty 20

## 2023-07-21 MED ORDER — LIDOCAINE HCL (PF) 2 % IJ SOLN
INTRAMUSCULAR | Status: AC
  Filled 2023-07-21: qty 5

## 2023-07-21 MED ORDER — LACTATED RINGERS IV BOLUS
1000.0000 mL | Freq: Once | INTRAVENOUS | Status: AC
  Administered 2023-07-21: 1000 mL via INTRAVENOUS

## 2023-07-21 MED ORDER — PHENYLEPHRINE 80 MCG/ML (10ML) SYRINGE FOR IV PUSH (FOR BLOOD PRESSURE SUPPORT)
PREFILLED_SYRINGE | INTRAVENOUS | Status: DC | PRN
  Administered 2023-07-21: 240 ug via INTRAVENOUS
  Administered 2023-07-21: 80 ug via INTRAVENOUS
  Administered 2023-07-21 (×3): 240 ug via INTRAVENOUS
  Administered 2023-07-21: 40 ug via INTRAVENOUS

## 2023-07-21 MED ORDER — SODIUM CHLORIDE 0.9 % IV SOLN
2.0000 g | Freq: Two times a day (BID) | INTRAVENOUS | Status: DC
  Administered 2023-07-21 – 2023-07-22 (×2): 2 g via INTRAVENOUS
  Filled 2023-07-21 (×2): qty 12.5

## 2023-07-21 MED ORDER — ONDANSETRON HCL 4 MG/2ML IJ SOLN
INTRAMUSCULAR | Status: AC
  Filled 2023-07-21: qty 2

## 2023-07-21 MED ORDER — LABETALOL HCL 5 MG/ML IV SOLN
10.0000 mg | INTRAVENOUS | Status: DC | PRN
  Filled 2023-07-21: qty 4

## 2023-07-21 MED ORDER — PHENYLEPHRINE HCL (PRESSORS) 10 MG/ML IV SOLN
INTRAVENOUS | Status: AC
  Filled 2023-07-21: qty 1

## 2023-07-21 MED ORDER — PHENYLEPHRINE HCL-NACL 20-0.9 MG/250ML-% IV SOLN: 25.0000 ug/min | INTRAVENOUS | Status: DC

## 2023-07-21 MED ORDER — CHLORHEXIDINE GLUCONATE CLOTH 2 % EX PADS
6.0000 | MEDICATED_PAD | Freq: Every day | CUTANEOUS | Status: DC
Start: 2023-07-21 — End: 2023-07-26
  Administered 2023-07-21 – 2023-07-25 (×5): 6 via TOPICAL

## 2023-07-21 MED ORDER — INSULIN ASPART 100 UNIT/ML IJ SOLN
0.0000 [IU] | Freq: Every day | INTRAMUSCULAR | Status: DC
  Administered 2023-07-23: 2 [IU] via SUBCUTANEOUS

## 2023-07-21 MED ORDER — PHENYLEPHRINE 80 MCG/ML (10ML) SYRINGE FOR IV PUSH (FOR BLOOD PRESSURE SUPPORT)
PREFILLED_SYRINGE | INTRAVENOUS | Status: AC
  Filled 2023-07-21: qty 10

## 2023-07-21 MED ORDER — VASOPRESSIN 20 UNIT/ML IV SOLN
INTRAVENOUS | Status: AC
  Filled 2023-07-21: qty 1

## 2023-07-21 MED ORDER — FENTANYL CITRATE (PF) 100 MCG/2ML IJ SOLN
INTRAMUSCULAR | Status: AC
  Filled 2023-07-21: qty 2

## 2023-07-21 MED ORDER — FENTANYL CITRATE (PF) 100 MCG/2ML IJ SOLN
INTRAMUSCULAR | Status: DC | PRN
  Administered 2023-07-21: 25 ug via INTRAVENOUS

## 2023-07-21 MED ORDER — LIDOCAINE HCL (PF) 2 % IJ SOLN
INTRAMUSCULAR | Status: DC | PRN
  Administered 2023-07-21: 100 mg via INTRADERMAL

## 2023-07-21 MED ORDER — SODIUM CHLORIDE 0.9 % IV SOLN
250.0000 mL | INTRAVENOUS | Status: AC
  Administered 2023-07-22: 250 mL via INTRAVENOUS

## 2023-07-21 MED ORDER — PHENYLEPHRINE HCL-NACL 20-0.9 MG/250ML-% IV SOLN
INTRAVENOUS | Status: DC | PRN
  Administered 2023-07-21: 50 ug/min via INTRAVENOUS

## 2023-07-21 MED ORDER — DEXAMETHASONE SODIUM PHOSPHATE 10 MG/ML IJ SOLN
INTRAMUSCULAR | Status: AC
  Filled 2023-07-21: qty 1

## 2023-07-21 MED ORDER — PERFLUTREN LIPID MICROSPHERE
1.0000 mL | INTRAVENOUS | Status: AC | PRN
  Administered 2023-07-21: 2 mL via INTRAVENOUS

## 2023-07-21 MED ORDER — ORAL CARE MOUTH RINSE: 15.0000 mL | OROMUCOSAL | Status: DC | PRN

## 2023-07-21 MED ORDER — DEXAMETHASONE SODIUM PHOSPHATE 10 MG/ML IJ SOLN
INTRAMUSCULAR | Status: DC | PRN
  Administered 2023-07-21: 10 mg via INTRAVENOUS

## 2023-07-21 MED ORDER — HYDRALAZINE HCL 20 MG/ML IJ SOLN: 10.0000 mg | INTRAMUSCULAR | Status: DC | PRN

## 2023-07-21 MED ORDER — VASOPRESSIN 20 UNIT/ML IV SOLN
INTRAVENOUS | Status: DC | PRN
Start: 2023-07-21 — End: 2023-07-21
  Administered 2023-07-21 (×2): 1 [IU] via INTRAVENOUS

## 2023-07-21 MED ORDER — INSULIN ASPART 100 UNIT/ML IJ SOLN
0.0000 [IU] | Freq: Three times a day (TID) | INTRAMUSCULAR | Status: DC
  Administered 2023-07-22 – 2023-07-25 (×3): 1 [IU] via SUBCUTANEOUS

## 2023-07-21 MED ORDER — STERILE WATER FOR IRRIGATION IR SOLN
Status: DC | PRN
  Administered 2023-07-21: 3000 mL

## 2023-07-21 MED ORDER — IOHEXOL 300 MG/ML  SOLN
INTRAMUSCULAR | Status: DC | PRN
  Administered 2023-07-21: 7 mL

## 2023-07-21 SURGICAL SUPPLY — 10 items
BAG URO CATCHER STRL LF (MISCELLANEOUS) ×1 IMPLANT
CATH URETL OPEN END 6FR 70 (CATHETERS) ×1 IMPLANT
CLOTH BEACON ORANGE TIMEOUT ST (SAFETY) ×1 IMPLANT
GLOVE SURG LX STRL 8.0 MICRO (GLOVE) ×1 IMPLANT
GOWN STRL REUS W/ TWL XL LVL3 (GOWN DISPOSABLE) ×2 IMPLANT
GUIDEWIRE STR DUAL SENSOR (WIRE) ×1 IMPLANT
MANIFOLD NEPTUNE II (INSTRUMENTS) ×1 IMPLANT
PACK CYSTO (CUSTOM PROCEDURE TRAY) ×1 IMPLANT
STENT URET 6FRX26 CONTOUR (STENTS) IMPLANT
TUBING CONNECTING 10 (TUBING) ×1 IMPLANT
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
## 2023-07-21 NOTE — Anesthesia Preprocedure Evaluation (Signed)
 Anesthesia Evaluation  Patient identified by MRN, date of birth, ID band Patient confused    Reviewed: Allergy & Precautions, H&P , NPO status , Patient's Chart, lab work & pertinent test results  Airway Mallampati: II   Neck ROM: full    Dental   Pulmonary sleep apnea    breath sounds clear to auscultation       Cardiovascular negative cardio ROS  Rhythm:regular Rate:Normal     Neuro/Psych    GI/Hepatic   Endo/Other  diabetesHypothyroidism    Renal/GU Renal diseaseStones. urosepsis     Musculoskeletal  (+) Arthritis ,    Abdominal   Peds  Hematology   Anesthesia Other Findings   Reproductive/Obstetrics                             Anesthesia Physical Anesthesia Plan  ASA: 3  Anesthesia Plan: General   Post-op Pain Management:    Induction: Intravenous  PONV Risk Score and Plan: 2 and Ondansetron, Dexamethasone and Treatment may vary due to age or medical condition  Airway Management Planned: LMA  Additional Equipment:   Intra-op Plan:   Post-operative Plan: Extubation in OR  Informed Consent: I have reviewed the patients History and Physical, chart, labs and discussed the procedure including the risks, benefits and alternatives for the proposed anesthesia with the patient or authorized representative who has indicated his/her understanding and acceptance.     Dental advisory given  Plan Discussed with: CRNA, Anesthesiologist and Surgeon  Anesthesia Plan Comments:        Anesthesia Quick Evaluation

## 2023-07-21 NOTE — Anesthesia Postprocedure Evaluation (Signed)
 Anesthesia Post Note  Patient: Edward Waller  Procedure(s) Performed: CYSTOSCOPY, WITH RETROGRADE PYELOGRAM AND URETERAL STENT INSERTION (Right: Pelvis)     Patient location during evaluation: PACU Anesthesia Type: General Level of consciousness: awake and alert Pain management: pain level controlled Vital Signs Assessment: post-procedure vital signs reviewed and stable Respiratory status: spontaneous breathing, nonlabored ventilation, respiratory function stable and patient connected to nasal cannula oxygen Cardiovascular status: blood pressure returned to baseline and stable Postop Assessment: no apparent nausea or vomiting Anesthetic complications: no   No notable events documented.  Last Vitals:  Vitals:   07/21/23 0930 07/21/23 0931  BP: (!) 121/55   Pulse: 77   Resp: (!) 23   Temp:  37.1 C  SpO2: 100%     Last Pain:  Vitals:   07/21/23 1224  TempSrc:   PainSc: 7                  [name] S

## 2023-07-21 NOTE — Progress Notes (Signed)
  Echocardiogram 2D Echocardiogram has been performed.  Edward Waller 07/21/2023, 3:39 PM

## 2023-07-21 NOTE — Assessment & Plan Note (Addendum)
-   History of right peroneal DVT on 01/11/2023 - Will clarify if still on Eliquis; suspect so given paraplegia since - hold for now with hematuria and recent urological procedure
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
## 2023-07-21 NOTE — Progress Notes (Signed)
 PHARMACY - PHYSICIAN COMMUNICATION CRITICAL VALUE ALERT - BLOOD CULTURE IDENTIFICATION (BCID)  Edward Waller is an 89 y.o. male who presented to [facility] on 07/20/2023 with a chief complaint of fever and confusion  Assessment:  1 of 4 bottles GNR, BCID Pseudomonas aeruginosa, presumed urinary source  Name of physician (or Provider) Contacted: D. Girguis  Current antibiotics: Cefepime 2 gm IV q12h  Changes to prescribed antibiotics recommended:  Patient is on recommended antibiotics - No changes needed  Results for orders placed or performed during the hospital encounter of 07/20/23  Blood Culture ID Panel (Reflexed) (Collected: 07/20/2023  7:30 PM)  Result Value Ref Range   Enterococcus faecalis NOT DETECTED NOT DETECTED   Enterococcus Faecium NOT DETECTED NOT DETECTED   Listeria monocytogenes NOT DETECTED NOT DETECTED   Staphylococcus species NOT DETECTED NOT DETECTED   Staphylococcus aureus (BCID) NOT DETECTED NOT DETECTED   Staphylococcus epidermidis NOT DETECTED NOT DETECTED   Staphylococcus lugdunensis NOT DETECTED NOT DETECTED   Streptococcus species NOT DETECTED NOT DETECTED   Streptococcus agalactiae NOT DETECTED NOT DETECTED   Streptococcus pneumoniae NOT DETECTED NOT DETECTED   Streptococcus pyogenes NOT DETECTED NOT DETECTED   A.calcoaceticus-baumannii NOT DETECTED NOT DETECTED   Bacteroides fragilis NOT DETECTED NOT DETECTED   Enterobacterales NOT DETECTED NOT DETECTED   Enterobacter cloacae complex NOT DETECTED NOT DETECTED   Escherichia coli NOT DETECTED NOT DETECTED   Klebsiella aerogenes NOT DETECTED NOT DETECTED   Klebsiella oxytoca NOT DETECTED NOT DETECTED   Klebsiella pneumoniae NOT DETECTED NOT DETECTED   Proteus species NOT DETECTED NOT DETECTED   Salmonella species NOT DETECTED NOT DETECTED   Serratia marcescens NOT DETECTED NOT DETECTED   Haemophilus influenzae NOT DETECTED NOT DETECTED   Neisseria meningitidis NOT DETECTED NOT DETECTED    Pseudomonas aeruginosa DETECTED (A) NOT DETECTED   Stenotrophomonas maltophilia NOT DETECTED NOT DETECTED   Candida albicans NOT DETECTED NOT DETECTED   Candida auris NOT DETECTED NOT DETECTED   Candida glabrata NOT DETECTED NOT DETECTED   Candida krusei NOT DETECTED NOT DETECTED   Candida parapsilosis NOT DETECTED NOT DETECTED   Candida tropicalis NOT DETECTED NOT DETECTED   Cryptococcus neoformans/gattii NOT DETECTED NOT DETECTED   CTX-M ESBL NOT DETECTED NOT DETECTED   Carbapenem resistance IMP NOT DETECTED NOT DETECTED   Carbapenem resistance KPC NOT DETECTED NOT DETECTED   Carbapenem resistance NDM NOT DETECTED NOT DETECTED   Carbapenem resistance VIM NOT DETECTED NOT DETECTED    Herby Abraham, Pharm.D Use secure chat for questions 07/21/2023 6:09 PM

## 2023-07-21 NOTE — Op Note (Signed)
.  Preoperative diagnosis: right proximal ureteralstone, sepsis  Postoperative diagnosis: Same  Procedure: 1 cystoscopy 2. right retrograde pyelography 3.  Intraoperative fluoroscopy, under one hour, with interpretation 4. right 6 x 26 JJ stent placement  Attending: Wilkie Aye  Anesthesia: General  Estimated blood loss: None  Drains: Right 6 x 26 JJ ureteral stent without tether, 16 French foley catheter  Specimens: none  Antibiotics: rocephin  Findings:right proximal ureteral stone. Moderate hydronephrosis. No masses/lesions in the bladder. Ureteral orifices in normal anatomic location.  Indications: Patient is a 89 year old male with a history of a right ureteral stone and concern for sepsis.  After discussing treatment options, they decided proceed with left stent placement.  Procedure in detail: The patient was brought to the operating room and a brief timeout was done to ensure correct patient, correct procedure, correct site.  General anesthesia was administered patient was placed in dorsal lithotomy position.  Their genitalia was then prepped and draped in usual sterile fashion.  A rigid 22 French cystoscope was passed in the urethra and the bladder.  Bladder was inspected free masses or lesions.  the ureteral orifices were in the normal orthotopic locations.  a 6 french ureteral catheter was then instilled into the right ureteral orifice.  a gentle retrograde was obtained and findings noted above.  we then placed a zip wire through the ureteral catheter and advanced up to the renal pelvis.    We then placed a 6 x 26 double-j ureteral stent over the original zip wire.  We then removed the wire and good coil was noted in the the renal pelvis under fluoroscopy and the bladder under direct vision.  A foley catheter was then placed. the bladder was then drained and this concluded the procedure which was well tolerated by patient.  Complications: None  Condition: Stable, extubated,  transferred to PACU  Plan: Patient is to be admitted for IV antibiotics. He will have his stone extraction in 2 weeks.

## 2023-07-21 NOTE — Anesthesia Procedure Notes (Signed)
 Procedure Name: LMA Insertion Date/Time: 07/21/2023 7:18 AM  Performed by: Doran Clay, CRNAPre-anesthesia Checklist: Patient identified, Emergency Drugs available, Suction available, Patient being monitored and Timeout performed Patient Re-evaluated:Patient Re-evaluated prior to induction Oxygen Delivery Method: Circle system utilized Preoxygenation: Pre-oxygenation with 100% oxygen Induction Type: IV induction LMA: LMA inserted LMA Size: 4.0 Tube type: Oral Number of attempts: 1 Placement Confirmation: positive ETCO2 and breath sounds checked- equal and bilateral Tube secured with: Tape Dental Injury: Teeth and Oropharynx as per pre-operative assessment
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
## 2023-07-21 NOTE — Progress Notes (Signed)
 CRITICAL RESULT PROVIDER NOTIFICATION  Test performed and critical result:  Troponin I 384  Date and time result received:  07/21/23 0715 VIA chat message from Unit RN  Provider name/title:   Date and time provider notified: 7:42 AM Dr Achille Rich  Date and time provider responded: 7:43 AM   Provider response:No new orders

## 2023-07-21 NOTE — Assessment & Plan Note (Signed)
Check TSH.  Continue Synthroid. 

## 2023-07-21 NOTE — Progress Notes (Signed)
   07/20/23 2259  Assess: MEWS Score  Temp (!) 103.2 F (39.6 C)  BP 134/84  Pulse Rate (!) 116  Resp 20  SpO2 93 %  O2 Device Nasal Cannula  Patient Activity (if Appropriate) In bed  O2 Flow Rate (L/min) 2 L/min  Assess: MEWS Score  MEWS Temp 2  MEWS Systolic 0  MEWS Pulse 2  MEWS RR 0  MEWS LOC 0  MEWS Score 4  MEWS Score Color Red  Assess: if the MEWS score is Yellow or Red  Were vital signs accurate and taken at a resting state? Yes  Does the patient meet 2 or more of the SIRS criteria? Yes  Does the patient have a confirmed or suspected source of infection? Yes  MEWS guidelines implemented  Yes, red  Treat  MEWS Interventions Considered administering scheduled or prn medications/treatments as ordered  Take Vital Signs  Increase Vital Sign Frequency  Red: Q1hr x2, continue Q4hrs until patient remains green for 12hrs  Escalate  MEWS: Escalate Red: Discuss with charge nurse and notify provider. Consider notifying RRT. If remains red for 2 hours consider need for higher level of care  Notify: Charge Nurse/RN  Name of Charge Nurse/RN Notified Calvin  Provider Notification  Provider Name/Title Garner Nash  Date Provider Notified 07/20/23  Time Provider Notified 2305  Method of Notification Page  Notification Reason Change in status  Provider response See new orders  Date of Provider Response 07/20/23  Time of Provider Response 2330  Notify: Rapid Response  Name of Rapid Response RN Notified Mandy  Date Rapid Response Notified 07/20/23  Time Rapid Response Notified 2330  Assess: SIRS CRITERIA  SIRS Temperature  1  SIRS Respirations  0  SIRS Pulse 1  SIRS WBC 1  SIRS Score Sum  3

## 2023-07-21 NOTE — Assessment & Plan Note (Signed)
-   Continue SSI and CBG monitoring ?

## 2023-07-21 NOTE — Consult Note (Signed)
 Urology Consult  Referring physician: Dr. Antionette Char Reason for referral: right ureteral stone fever  Chief Complaint: right abdominal pain  History of Present Illness: Edward Waller is a 89yo with a history of BPH and nephrolithiasis who presented to the ER tonight with a 1 day history of worsening confusion and abdominal pain. He was treated for a UTI in the past weak but the patient and wife noted purulent urine when patient performed CIC today. He then developed right abdominal pain which is mild, dull, intermittent and nonradiating. He denies any fevers or chills. He denies any nausea or vomiting. CT in the ER shows a right 6-30mm proximal ureteral stone with wild hydronephrosis and perinephric stranding.   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   Cataract    bilateral   Fx ankle    left; Dec 2021   Hip pain, chronic, right    History of kidney stones    Hypothyroidism    Liver cyst    Low back pain    OA (osteoarthritis) of hip    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    Renal cyst, left    Restless leg syndrome    Spinal stenosis    Thyroid disease    Past Surgical History:  Procedure Laterality Date   BACK SURGERY     EYE SURGERY     bilateral cataract removal with Lens   LAMINECTOMY WITH POSTERIOR LATERAL ARTHRODESIS LEVEL 3 N/A 01/03/2023   Procedure: OPEN REDUCTION INTERNAL FIXATION THORACIC SEVEN -THORACIC NINE;  Surgeon: Coletta Memos, MD;  Location: [facility] OR;  Service: Neurosurgery;  Laterality: N/A;   TIBIA IM NAIL INSERTION Right 07/06/2021   Procedure: INTRAMEDULLARY (IM) NAIL TIBIAL;  Surgeon: Myrene Galas, MD;  Location: [facility] OR;  Service: Orthopedics;  Laterality: Right;   TOTAL HIP ARTHROPLASTY Right 03/20/2022   Procedure: TOTAL HIP ARTHROPLASTY ANTERIOR APPROACH;  Surgeon: Ollen Gross, MD;  Location: [facility];  Service: Orthopedics;  Laterality: Right;   TOTAL HIP  ARTHROPLASTY Right 2023    Medications: I have reviewed the patient's current medications. Allergies:  Allergies  Allergen Reactions   Trazodone And Nefazodone     nightmares    History reviewed. No pertinent family history. Social History:  reports that he has never smoked. He has never used smokeless tobacco. He reports that he does not drink alcohol and does not use drugs.  Review of Systems  Gastrointestinal:  Positive for abdominal pain.  Genitourinary:  Positive for flank pain.  All other systems reviewed and are negative.   Physical Exam:  Vital signs in last 24 hours: Temp:  [98.6 F (37 C)-103.2 F (39.6 C)] 98.9 F (37.2 C) (03/31 0449) Pulse Rate:  [92-116] 92 (03/31 0449) Resp:  [18-20] 18 (03/31 0449) BP: (91-134)/(56-91) 120/91 (03/31 0449) SpO2:  [87 %-98 %] 94 % (03/31 0449) FiO2 (%):  [28 %] 28 % (03/30 2202) Weight:  [77.1 kg] 77.1 kg (03/30 1809) Physical Exam Vitals reviewed.  Constitutional:      Appearance: Normal appearance.  HENT:     Head: Normocephalic and atraumatic.     Nose: Nose normal.     Mouth/Throat:     Mouth: Mucous membranes are dry.  Eyes:     Extraocular Movements: Extraocular movements intact.     Pupils: Pupils are equal, round, and reactive to light.  Cardiovascular:  Rate and Rhythm: Normal rate and regular rhythm.  Pulmonary:     Effort: Pulmonary effort is normal. No respiratory distress.  Abdominal:     General: Abdomen is flat. There is no distension.  Musculoskeletal:        General: No swelling. Normal range of motion.     Cervical back: Normal range of motion and neck supple.  Skin:    General: Skin is warm and dry.  Neurological:     Mental Status: He is alert. He is disoriented.  Psychiatric:        Mood and Affect: Mood normal.        Behavior: Behavior normal.     Laboratory Data:  Results for orders placed or performed during the hospital encounter of 07/20/23 (from the past 72 hours)   Urinalysis, w/ Reflex to Culture (Infection Suspected) -Urine, Catheterized     Status: Abnormal   Collection Time: 07/20/23  6:30 PM  Result Value Ref Range   Specimen Source URINE, CATHETERIZED    Color, Urine YELLOW YELLOW   APPearance CLOUDY (A) CLEAR   Specific Gravity, Urine 1.014 1.005 - 1.030   pH 5.0 5.0 - 8.0   Glucose, UA 150 (A) NEGATIVE mg/dL   Hgb urine dipstick SMALL (A) NEGATIVE   Bilirubin Urine NEGATIVE NEGATIVE   Ketones, ur NEGATIVE NEGATIVE mg/dL   Protein, ur NEGATIVE NEGATIVE mg/dL   Nitrite NEGATIVE NEGATIVE   Leukocytes,Ua LARGE (A) NEGATIVE   RBC / HPF 0-5 0 - 5 RBC/hpf   WBC, UA >50 0 - 5 WBC/hpf    Comment:        Reflex urine culture not performed if WBC <=10, OR if Squamous epithelial cells >5. If Squamous epithelial cells >5 suggest recollection.    Bacteria, UA MANY (A) NONE SEEN   Squamous Epithelial / HPF 0-5 0 - 5 /HPF   WBC Clumps PRESENT     Comment: Performed at [facility], [address], [place], [place] [address]  Comprehensive metabolic panel     Status: Abnormal   Collection Time: 07/20/23  6:37 PM  Result Value Ref Range   Sodium 134 (L) 135 - 145 mmol/L   Potassium 4.4 3.5 - 5.1 mmol/L   Chloride 99 98 - 111 mmol/L   CO2 26 22 - 32 mmol/L   Glucose, Bld 188 (H) 70 - 99 mg/dL    Comment: Glucose reference range applies only to samples taken after fasting for at least 8 hours.   BUN 23 8 - 23 mg/dL   Creatinine, Ser 6.04 0.61 - 1.24 mg/dL   Calcium 9.1 8.9 - 54.0 mg/dL   Total Protein 7.7 6.5 - 8.1 g/dL   Albumin 3.6 3.5 - 5.0 g/dL   AST 16 15 - 41 U/L   ALT 11 0 - 44 U/L   Alkaline Phosphatase 87 38 - 126 U/L   Total Bilirubin 1.2 0.0 - 1.2 mg/dL   GFR, Estimated >98 >11 mL/min    Comment: (NOTE) Calculated using the CKD-EPI Creatinine Equation (2021)    Anion gap 9 5 - 15    Comment: Performed at [facility], [address], [place], [place] [address]  Lipase, blood     Status:  None   Collection Time: 07/20/23  6:37 PM  Result Value Ref Range   Lipase 23 11 - 51 U/L    Comment: Performed at [facility], [address], [place], [place] [address]  Troponin  I (High Sensitivity)     Status: None   Collection Time: 07/20/23  6:37 PM  Result Value Ref Range   Troponin I (High Sensitivity) 13 <18 ng/L    Comment: (NOTE) Elevated high sensitivity troponin I (hsTnI) values and significant  changes across serial measurements may suggest ACS but many other  chronic and acute conditions are known to elevate hsTnI results.  Refer to the "Links" section for chest pain algorithms and additional  guidance. Performed at [facility], [address], [place], [place] [address]   CBC with Differential     Status: Abnormal   Collection Time: 07/20/23  6:37 PM  Result Value Ref Range   WBC 15.0 (H) 4.0 - 10.5 K/uL   RBC 4.86 4.22 - 5.81 MIL/uL   Hemoglobin 15.6 13.0 - 17.0 g/dL   HCT 60.4 54.0 - 98.1 %   MCV 96.7 80.0 - 100.0 fL   MCH 32.1 26.0 - 34.0 pg   MCHC 33.2 30.0 - 36.0 g/dL   RDW 19.1 47.8 - 29.5 %   Platelets 184 150 - 400 K/uL   nRBC 0.0 0.0 - 0.2 %   Neutrophils Relative % 87 %   Neutro Abs 13.1 (H) 1.7 - 7.7 K/uL   Lymphocytes Relative 6 %   Lymphs Abs 0.9 0.7 - 4.0 K/uL   Monocytes Relative 6 %   Monocytes Absolute 0.8 0.1 - 1.0 K/uL   Eosinophils Relative 0 %   Eosinophils Absolute 0.0 0.0 - 0.5 K/uL   Basophils Relative 0 %   Basophils Absolute 0.0 0.0 - 0.1 K/uL   Immature Granulocytes 1 %   Abs Immature Granulocytes 0.09 (H) 0.00 - 0.07 K/uL    Comment: Performed at [facility] [address] [place], [place] [address]  Resp panel by RT-PCR (RSV, Flu A&B, Covid) Anterior Nasal Swab     Status: None   Collection Time: 07/20/23  6:42 PM   Specimen: Anterior Nasal Swab  Result Value Ref Range   SARS Coronavirus 2 by RT PCR NEGATIVE NEGATIVE    Comment: (NOTE) SARS-CoV-2 target nucleic  acids are NOT DETECTED.  The SARS-CoV-2 RNA is generally detectable in upper respiratory specimens during the acute phase of infection. The lowest concentration of SARS-CoV-2 viral copies this assay can detect is 138 copies/mL. A negative result does not preclude SARS-Cov-2 infection and should not be used as the sole basis for treatment or other patient management decisions. A negative result may occur with  improper specimen collection/handling, submission of specimen other than nasopharyngeal swab, presence of viral mutation(s) within the areas targeted by this assay, and inadequate number of viral copies(<138 copies/mL). A negative result must be combined with clinical observations, patient history, and epidemiological information. The expected result is Negative.  Fact Sheet for Patients:  BloggerCourse.com  Fact Sheet for Healthcare Providers:  SeriousBroker.it  This test is no t yet approved or cleared by the [place] FDA and  has been authorized for detection and/or diagnosis of SARS-CoV-2 by FDA under an Emergency Use Authorization (EUA). This EUA will remain  in effect (meaning this test can be used) for the duration of the COVID-19 declaration under Section 564(b)(1) of the Act, 21 U.S.C.section 360bbb-3(b)(1), unless the authorization is terminated  or revoked sooner.       Influenza A by PCR NEGATIVE NEGATIVE   Influenza B by PCR NEGATIVE NEGATIVE    Comment: (NOTE) The Xpert Xpress SARS-CoV-2/FLU/RSV plus assay is intended as  an aid in the diagnosis of influenza from Nasopharyngeal swab specimens and should not be used as a sole basis for treatment. Nasal washings and aspirates are unacceptable for Xpert Xpress SARS-CoV-2/FLU/RSV testing.  Fact Sheet for Patients: BloggerCourse.com  Fact Sheet for Healthcare Providers: SeriousBroker.it  This test is not  yet approved or cleared by the [place] FDA and has been authorized for detection and/or diagnosis of SARS-CoV-2 by FDA under an Emergency Use Authorization (EUA). This EUA will remain in effect (meaning this test can be used) for the duration of the COVID-19 declaration under Section 564(b)(1) of the Act, 21 U.S.C. section 360bbb-3(b)(1), unless the authorization is terminated or revoked.     Resp Syncytial Virus by PCR NEGATIVE NEGATIVE    Comment: (NOTE) Fact Sheet for Patients: BloggerCourse.com  Fact Sheet for Healthcare Providers: SeriousBroker.it  This test is not yet approved or cleared by the [place] FDA and has been authorized for detection and/or diagnosis of SARS-CoV-2 by FDA under an Emergency Use Authorization (EUA). This EUA will remain in effect (meaning this test can be used) for the duration of the COVID-19 declaration under Section 564(b)(1) of the Act, 21 U.S.C. section 360bbb-3(b)(1), unless the authorization is terminated or revoked.  Performed at [facility], [address], [place], [place] [address]   I-Stat Lactic Acid     Status: None   Collection Time: 07/20/23  7:03 PM  Result Value Ref Range   Lactic Acid, Venous 1.9 0.5 - 1.9 mmol/L  Glucose, capillary     Status: Abnormal   Collection Time: 07/20/23 10:58 PM  Result Value Ref Range   Glucose-Capillary 170 (H) 70 - 99 mg/dL    Comment: Glucose reference range applies only to samples taken after fasting for at least 8 hours.  Glucose, capillary     Status: Abnormal   Collection Time: 07/21/23  4:49 AM  Result Value Ref Range   Glucose-Capillary 116 (H) 70 - 99 mg/dL    Comment: Glucose reference range applies only to samples taken after fasting for at least 8 hours.   Recent Results (from the past 240 hours)  Resp panel by RT-PCR (RSV, Flu A&B, Covid) Anterior Nasal Swab     Status: None   Collection Time:  07/20/23  6:42 PM   Specimen: Anterior Nasal Swab  Result Value Ref Range Status   SARS Coronavirus 2 by RT PCR NEGATIVE NEGATIVE Final    Comment: (NOTE) SARS-CoV-2 target nucleic acids are NOT DETECTED.  The SARS-CoV-2 RNA is generally detectable in upper respiratory specimens during the acute phase of infection. The lowest concentration of SARS-CoV-2 viral copies this assay can detect is 138 copies/mL. A negative result does not preclude SARS-Cov-2 infection and should not be used as the sole basis for treatment or other patient management decisions. A negative result may occur with  improper specimen collection/handling, submission of specimen other than nasopharyngeal swab, presence of viral mutation(s) within the areas targeted by this assay, and inadequate number of viral copies(<138 copies/mL). A negative result must be combined with clinical observations, patient history, and epidemiological information. The expected result is Negative.  Fact Sheet for Patients:  BloggerCourse.com  Fact Sheet for Healthcare Providers:  SeriousBroker.it  This test is no t yet approved or cleared by the [place] FDA and  has been authorized for detection and/or diagnosis of SARS-CoV-2 by FDA under an Emergency Use Authorization (EUA). This EUA will remain  in effect (meaning this test can be used)  for the duration of the COVID-19 declaration under Section 564(b)(1) of the Act, 21 U.S.C.section 360bbb-3(b)(1), unless the authorization is terminated  or revoked sooner.       Influenza A by PCR NEGATIVE NEGATIVE Final   Influenza B by PCR NEGATIVE NEGATIVE Final    Comment: (NOTE) The Xpert Xpress SARS-CoV-2/FLU/RSV plus assay is intended as an aid in the diagnosis of influenza from Nasopharyngeal swab specimens and should not be used as a sole basis for treatment. Nasal washings and aspirates are unacceptable for Xpert Xpress  SARS-CoV-2/FLU/RSV testing.  Fact Sheet for Patients: BloggerCourse.com  Fact Sheet for Healthcare Providers: SeriousBroker.it  This test is not yet approved or cleared by the [place] FDA and has been authorized for detection and/or diagnosis of SARS-CoV-2 by FDA under an Emergency Use Authorization (EUA). This EUA will remain in effect (meaning this test can be used) for the duration of the COVID-19 declaration under Section 564(b)(1) of the Act, 21 U.S.C. section 360bbb-3(b)(1), unless the authorization is terminated or revoked.     Resp Syncytial Virus by PCR NEGATIVE NEGATIVE Final    Comment: (NOTE) Fact Sheet for Patients: BloggerCourse.com  Fact Sheet for Healthcare Providers: SeriousBroker.it  This test is not yet approved or cleared by the [place] FDA and has been authorized for detection and/or diagnosis of SARS-CoV-2 by FDA under an Emergency Use Authorization (EUA). This EUA will remain in effect (meaning this test can be used) for the duration of the COVID-19 declaration under Section 564(b)(1) of the Act, 21 U.S.C. section 360bbb-3(b)(1), unless the authorization is terminated or revoked.  Performed at [facility], [address], [place], [place] [address]    Creatinine: Recent Labs    07/20/23 1837  CREATININE 1.16   Baseline Creatinine: 1  Impression/Assessment:  89yo with right ureteral calculus, UTI  Plan:  -We discussed the management of kidney stones. These options include observation, ureteroscopy, shockwave lithotripsy (ESWL) and percutaneous nephrolithotomy (PCNL). We discussed which options are relevant to the patient's stone(s). We discussed the natural history of kidney stones as well as the complications of untreated stones and the impact on quality of life without treatment as well as with each of the above  listed treatments. We also discussed the efficacy of each treatment in its ability to clear the stone burden. With any of these management options I discussed the signs and symptoms of infection and the need for emergent treatment should these be experienced. For each option we discussed the ability of each procedure to clear the patient of their stone burden.   For observation I described the risks which include but are not limited to silent renal damage, life-threatening infection, need for emergent surgery, failure to pass stone and pain.   For ureteroscopy I described the risks which include bleeding, infection, damage to contiguous structures, positioning injury, ureteral stricture, ureteral avulsion, ureteral injury, need for prolonged ureteral stent, inability to perform ureteroscopy, need for an interval procedure, inability to clear stone burden, stent discomfort/pain, heart attack, stroke, pulmonary embolus and the inherent risks with general anesthesia.   For shockwave lithotripsy I described the risks which include arrhythmia, kidney contusion, kidney hemorrhage, need for transfusion, pain, inability to adequately break up stone, inability to pass stone fragments, Steinstrasse, infection associated with obstructing stones, need for alternate surgical procedure, need for repeat shockwave lithotripsy, MI, CVA, PE and the inherent risks with anesthesia/conscious sedation.   For PCNL I described the risks including positioning injury, pneumothorax, hydrothorax, need for  chest tube, inability to clear stone burden, renal laceration, arterial venous fistula or malformation, need for embolization of kidney, loss of kidney or renal function, need for repeat procedure, need for prolonged nephrostomy tube, ureteral avulsion, MI, CVA, PE and the inherent risks of general anesthesia.   - The patient would like to proceed with Right ureteral stent placement  Edward Waller 07/21/2023, 5:11 AM

## 2023-07-21 NOTE — Assessment & Plan Note (Addendum)
-   fever, tachycardia, leukocytosis, hypotension requiring pressors in OR during cysto - lactic normal on admission - will give further LR bolus this morning postop and wean neo as able; if unable, will transfer care to PCCM, but so far nursing able to wean - continue maintenance rate LR also given metabolic acidosis noted -Check cortisol and TSH

## 2023-07-21 NOTE — Assessment & Plan Note (Signed)
-   on vesicare at home

## 2023-07-21 NOTE — Progress Notes (Addendum)
 Called Dr [name] for clarification this morning about consent.  Left message for Dr [name] to return call   Call from Dr [name] returned to short stay.  The procedure has not yet been explained to patient or patients wife.

## 2023-07-21 NOTE — Assessment & Plan Note (Signed)
-   baseline creatinine ~ 0.8 - 0.9 - patient presents with increase in creat >0.3 mg/dL above baseline or creat increase >1.5x baseline presumed to have occurred within past 7 days PTA - very mild, but creat trending up to 1.25 this morning - at risk for ATN given pressor need and septic shock - continue on LR today - repeat BMP in am

## 2023-07-21 NOTE — Transfer of Care (Addendum)
 Immediate Anesthesia Transfer of Care Note  Patient: Edward Waller  Procedure(s) Performed: CYSTOSCOPY, WITH RETROGRADE PYELOGRAM AND URETERAL STENT INSERTION (Right: Pelvis)  Patient Location: PACU  Anesthesia Type:General  Level of Consciousness: sedated  Airway & Oxygen Therapy: Patient Spontanous Breathing and Patient connected to face mask oxygen  Post-op Assessment: Report given to RN and Post -op Vital signs reviewed and stable  Post vital signs: Reviewed and stable  Last Vitals:  Vitals Value Taken Time  BP    Temp    Pulse    Resp    SpO2      Last Pain:  Vitals:   07/21/23 0645  TempSrc:   PainSc: 5          Complications: Patient hypotensive on Phenylephrine gtt at 75 mcg/min. Received 2 doses pf vaspresin in the OR

## 2023-07-21 NOTE — Assessment & Plan Note (Signed)
-   CT A/P showed migration of 8 mm kidney stone down into the proximal right ureter causing hydroureteronephrosis; also noted with urothelial thickening and peripelvic and periureteral stranding -UA consistent with infection - Follow-up urine and blood cultures; growing Pseudomonas in both - Pseudo was resistant to cefepime, so will change to meropenem; can likely do FLQ at discharge but will use meropenem inpatient in setting of cardiac issues currently
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
## 2023-07-21 NOTE — Assessment & Plan Note (Addendum)
-   due to paraplegia as noted above - undergoes CIC at home via help from wife; does seem that his wife is a little overwhelmed with the amount of caths plus in the middle of the night - will discuss with urology about any other potential long-term options such as suprapubic cath or chronic indwelling Foley at this point -On Vesicare and Myrbetriq at home

## 2023-07-21 NOTE — Assessment & Plan Note (Addendum)
-   no prior known history of cardiac disease; no noted CP on admission - EKG negative for ischemic signs on admission and on repeat after OR - trop initially 13 but has trended up; suspect demand in setting of septic shock/infection; discussed with his wife bedside to update her as well - will trend to peak and ask for cardiology input
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
## 2023-07-22 ENCOUNTER — Encounter (HOSPITAL_COMMUNITY): Payer: Self-pay | Admitting: Urology

## 2023-07-22 DIAGNOSIS — N319 Neuromuscular dysfunction of bladder, unspecified: Secondary | ICD-10-CM | POA: Diagnosis not present

## 2023-07-22 DIAGNOSIS — R7989 Other specified abnormal findings of blood chemistry: Secondary | ICD-10-CM | POA: Diagnosis not present

## 2023-07-22 DIAGNOSIS — A4189 Other specified sepsis: Secondary | ICD-10-CM | POA: Diagnosis not present

## 2023-07-22 DIAGNOSIS — N201 Calculus of ureter: Secondary | ICD-10-CM | POA: Diagnosis not present

## 2023-07-22 DIAGNOSIS — A419 Sepsis, unspecified organism: Secondary | ICD-10-CM | POA: Diagnosis not present

## 2023-07-22 DIAGNOSIS — I214 Non-ST elevation (NSTEMI) myocardial infarction: Secondary | ICD-10-CM | POA: Diagnosis not present

## 2023-07-22 LAB — GLUCOSE, CAPILLARY
Glucose-Capillary: 155 mg/dL — ABNORMAL HIGH (ref 70–99)
Glucose-Capillary: 117 mg/dL — ABNORMAL HIGH (ref 70–99)
Glucose-Capillary: 156 mg/dL — ABNORMAL HIGH (ref 70–99)
Glucose-Capillary: 145 mg/dL — ABNORMAL HIGH (ref 70–99)
Glucose-Capillary: 172 mg/dL — ABNORMAL HIGH (ref 70–99)

## 2023-07-22 LAB — CBC WITH DIFFERENTIAL/PLATELET
Abs Immature Granulocytes: 0.79 10*3/uL — ABNORMAL HIGH (ref 0.00–0.07)
Basophils Absolute: 0 10*3/uL (ref 0.0–0.1)
Basophils Relative: 0 %
Eosinophils Absolute: 0 10*3/uL (ref 0.0–0.5)
Eosinophils Relative: 0 %
HCT: 36.9 % — ABNORMAL LOW (ref 39.0–52.0)
Hemoglobin: 12 g/dL — ABNORMAL LOW (ref 13.0–17.0)
Immature Granulocytes: 5 %
Lymphocytes Relative: 4 %
Lymphs Abs: 0.7 10*3/uL (ref 0.7–4.0)
MCH: 32 pg (ref 26.0–34.0)
MCHC: 32.5 g/dL (ref 30.0–36.0)
MCV: 98.4 fL (ref 80.0–100.0)
Monocytes Absolute: 0.8 10*3/uL (ref 0.1–1.0)
Monocytes Relative: 5 %
Neutro Abs: 13.7 10*3/uL — ABNORMAL HIGH (ref 1.7–7.7)
Neutrophils Relative %: 86 %
Platelets: 115 10*3/uL — ABNORMAL LOW (ref 150–400)
RBC: 3.75 MIL/uL — ABNORMAL LOW (ref 4.22–5.81)
RDW: 14 % (ref 11.5–15.5)
Smear Review: NORMAL
WBC: 16 10*3/uL — ABNORMAL HIGH (ref 4.0–10.5)
nRBC: 0 % (ref 0.0–0.2)

## 2023-07-22 LAB — TROPONIN I (HIGH SENSITIVITY)
Troponin I (High Sensitivity): 484 ng/L (ref <18)
Troponin I (High Sensitivity): 467 ng/L (ref <18)
Troponin I (High Sensitivity): 314 ng/L (ref <18)

## 2023-07-22 LAB — MAGNESIUM: Magnesium: 2 mg/dL (ref 1.7–2.4)

## 2023-07-22 LAB — URINE CULTURE: Culture: >=100000 — AB

## 2023-07-22 LAB — BASIC METABOLIC PANEL WITH GFR
Anion gap: 6 (ref 5–15)
BUN: 25 mg/dL — ABNORMAL HIGH (ref 8–23)
CO2: 23 mmol/L (ref 22–32)
Calcium: 8.3 mg/dL — ABNORMAL LOW (ref 8.9–10.3)
Chloride: 105 mmol/L (ref 98–111)
Creatinine, Ser: 0.77 mg/dL (ref 0.61–1.24)
GFR, Estimated: >60 mL/min (ref >60)
Glucose, Bld: 163 mg/dL — ABNORMAL HIGH (ref 70–99)
Potassium: 4.2 mmol/L (ref 3.5–5.1)
Sodium: 134 mmol/L — ABNORMAL LOW (ref 135–145)

## 2023-07-22 MED ORDER — SODIUM CHLORIDE 0.9 % IV BOLUS
250.0000 mL | Freq: Once | INTRAVENOUS | Status: AC
  Administered 2023-07-22: 250 mL via INTRAVENOUS

## 2023-07-22 MED ORDER — SODIUM CHLORIDE 0.9 % IV BOLUS
500.0000 mL | Freq: Once | INTRAVENOUS | Status: AC
  Administered 2023-07-22: 500 mL via INTRAVENOUS

## 2023-07-22 MED ORDER — LACTATED RINGERS IV BOLUS
1000.0000 mL | Freq: Once | INTRAVENOUS | Status: AC
Start: 2023-07-22 — End: 2023-07-22
  Administered 2023-07-22: 1000 mL via INTRAVENOUS

## 2023-07-22 MED ORDER — MELATONIN 3 MG PO TABS
3.0000 mg | ORAL_TABLET | Freq: Once | ORAL | Status: AC
  Administered 2023-07-23: 3 mg via ORAL
  Filled 2023-07-22: qty 1

## 2023-07-22 MED ORDER — SODIUM CHLORIDE 0.9 % IV SOLN
1.0000 g | Freq: Three times a day (TID) | INTRAVENOUS | Status: DC
  Administered 2023-07-22 – 2023-07-26 (×12): 1 g via INTRAVENOUS
  Filled 2023-07-22 (×14): qty 20

## 2023-07-22 MED ORDER — ASPIRIN 81 MG PO TBEC
81.0000 mg | DELAYED_RELEASE_TABLET | Freq: Every day | ORAL | Status: DC
  Administered 2023-07-23 – 2023-07-26 (×4): 81 mg via ORAL
  Filled 2023-07-22 (×4): qty 1

## 2023-07-22 MED ORDER — LACTATED RINGERS IV SOLN: INTRAVENOUS | Status: AC

## 2023-07-22 NOTE — Progress Notes (Addendum)
 1 Day Post-Op Subjective: First time meeting pt. He was alert and oriented, in good spirits. He reports only one other kidney stone over 2 decades ago. Reviewed anatomy and plan. All questions answered to his satisfaction.   Objective: Vital signs in last 24 hours: Temp:  [97.9 F (36.6 C)-98.7 F (37.1 C)] 98.5 F (36.9 C) (04/01 0800) Pulse Rate:  [50-87] 60 (04/01 0915) Resp:  [11-28] 11 (04/01 0915) BP: (79-164)/(42-99) 94/49 (04/01 0915) SpO2:  [93 %-100 %] 95 % (04/01 0915)  Assessment/Plan:  89y male paraplegic w/ pseudomonal bacteremia/UTI, now s/p ureteral stent.   #Ureteral stone #neurogenic bladder #pseudomonal bacteremia/UTI  S/p right ureteral stent with McKenzie on 07/21/23. Follow up closely for definitive stone mgmt in 2 weeks.  ABX per primary Pt has been intermittent catheterizing 4xd. Foley to remain in place while in hospital. Remove just prior to discharge and resume CIC at home.   Intake/Output from previous day: 03/31 0701 - 04/01 0700 In: 4377.6 [P.O.:240; I.V.:2261.2; IV Piggyback:1876.4] Out: 1800 [Urine:1800]  Intake/Output this shift: Total I/O In: -  Out: 650 [Urine:650]  Physical Exam:  General: Alert and oriented CV: No cyanosis Lungs: equal chest rise Abdomen: Soft, NTND, no rebound or guarding Gu: foley in place draining clear yellow urine, some sediment  Lab Results: Recent Labs    07/20/23 1837 07/21/23 0517 07/22/23 0318  HGB 15.6 14.3 12.0*  HCT 47.0 44.8 36.9*   BMET Recent Labs    07/21/23 0517 07/22/23 0318  NA 133* 134*  K 4.4 4.2  CL 103 105  CO2 19* 23  GLUCOSE 139* 163*  BUN 22 25*  CREATININE 1.25* 0.77  CALCIUM 8.6* 8.3*     Studies/Results: ECHOCARDIOGRAM COMPLETE Result Date: 07/21/2023    ECHOCARDIOGRAM REPORT   Patient Name:   Edward Waller Date of Exam: 07/21/2023 Medical Rec #:  [id number]       Height:       72.0 in Accession #:    [id number]      Weight:       183.2 lb Date of Birth:   [birth year]       BSA:          2.053 m Patient Age:    89 years        BP:           121/55 mmHg Patient Gender: M               HR:           59 bpm. Exam Location:  Inpatient Procedure: 2D Echo, Cardiac Doppler and Color Doppler (Both Spectral and Color            Flow Doppler were utilized during procedure). Indications:    R07.9* Chest pain, unspecified. Elevated troponin.  History:        Patient has no prior history of Echocardiogram examinations.                 Signs/Symptoms:Bacteremia and Chest Pain; Risk Factors:Diabetes.  Sonographer:    Sheralyn Boatman RDCS Referring Phys: [id number] [facility] IMPRESSIONS  1. Left ventricular ejection fraction, by estimation, is 45 to 50%. The left ventricle has low normal function. The left ventricle demonstrates global hypokinesis. Left ventricular diastolic parameters are consistent with Grade I diastolic dysfunction (impaired relaxation).  2. Right ventricular systolic function is normal. The right ventricular size is normal. There is mildly elevated pulmonary artery systolic pressure.  3. The mitral valve is  degenerative. Trivial mitral valve regurgitation. No evidence of mitral stenosis.  4. The aortic valve is tricuspid. There is mild calcification of the aortic valve. Aortic valve regurgitation is not visualized. Aortic valve sclerosis/calcification is present, without any evidence of aortic stenosis.  5. The inferior vena cava is dilated in size with <50% respiratory variability, suggesting right atrial pressure of 15 mmHg. Comparison(s): No prior Echocardiogram. Conclusion(s)/Recommendation(s): No evidence of valvular vegetations on this transthoracic echocardiogram. Consider a transesophageal echocardiogram to exclude infective endocarditis if clinically indicated. FINDINGS  Left Ventricle: Left ventricular ejection fraction, by estimation, is 45 to 50%. The left ventricle has low normal function. The left ventricle demonstrates global hypokinesis. The left  ventricular internal cavity size was normal in size. There is no left ventricular hypertrophy. Left ventricular diastolic parameters are consistent with Grade I diastolic dysfunction (impaired relaxation). Right Ventricle: The right ventricular size is normal. No increase in right ventricular wall thickness. Right ventricular systolic function is normal. There is mildly elevated pulmonary artery systolic pressure. The tricuspid regurgitant velocity is 2.31  m/s, and with an assumed right atrial pressure of 15 mmHg, the estimated right ventricular systolic pressure is 36.3 mmHg. Left Atrium: Left atrial size was normal in size. Right Atrium: Right atrial size was normal in size. Pericardium: There is no evidence of pericardial effusion. Mitral Valve: The mitral valve is degenerative in appearance. Trivial mitral valve regurgitation. No evidence of mitral valve stenosis. Tricuspid Valve: The tricuspid valve is normal in structure. Tricuspid valve regurgitation is trivial. No evidence of tricuspid stenosis. Aortic Valve: The aortic valve is tricuspid. There is mild calcification of the aortic valve. Aortic valve regurgitation is not visualized. Aortic valve sclerosis/calcification is present, without any evidence of aortic stenosis. Pulmonic Valve: The pulmonic valve was normal in structure. Pulmonic valve regurgitation is not visualized. No evidence of pulmonic stenosis. Aorta: The aortic root and ascending aorta are structurally normal, with no evidence of dilitation. Venous: The inferior vena cava is dilated in size with less than 50% respiratory variability, suggesting right atrial pressure of 15 mmHg. IAS/Shunts: No atrial level shunt detected by color flow Doppler.  LEFT VENTRICLE PLAX 2D LVIDd:         5.00 cm      Diastology LVIDs:         3.80 cm      LV e' medial:    4.79 cm/s LV PW:         1.10 cm      LV E/e' medial:  15.5 LV IVS:        1.00 cm      LV e' lateral:   7.62 cm/s LVOT diam:     2.50 cm       LV E/e' lateral: 9.7 LV SV:         86 LV SV Index:   42 LVOT Area:     4.91 cm  LV Volumes (MOD) LV vol d, MOD A2C: 125.0 ml LV vol d, MOD A4C: 87.6 ml LV vol s, MOD A2C: 53.3 ml LV vol s, MOD A4C: 42.4 ml LV SV MOD A2C:     71.7 ml LV SV MOD A4C:     87.6 ml LV SV MOD BP:      59.6 ml RIGHT VENTRICLE            IVC RV S prime:     8.70 cm/s  IVC diam: 2.90 cm TAPSE (M-mode): 1.8 cm LEFT ATRIUM  Index        RIGHT ATRIUM           Index LA diam:        2.60 cm 1.27 cm/m   RA Area:     15.80 cm LA Vol (A2C):   24.5 ml 11.94 ml/m  RA Volume:   39.50 ml  19.24 ml/m LA Vol (A4C):   16.8 ml 8.18 ml/m LA Biplane Vol: 20.1 ml 9.79 ml/m  AORTIC VALVE LVOT Vmax:   83.90 cm/s LVOT Vmean:  56.300 cm/s LVOT VTI:    0.176 m  AORTA Ao Root diam: 3.70 cm Ao Asc diam:  3.60 cm MITRAL VALVE                TRICUSPID VALVE MV Area (PHT): 3.88 cm     TR Peak grad:   21.3 mmHg MV Decel Time: 196 msec     TR Vmax:        231.00 cm/s MV E velocity: 74.05 cm/s MV A velocity: 109.00 cm/s  SHUNTS MV E/A ratio:  0.68         Systemic VTI:  0.18 m                             Systemic Diam: 2.50 cm Rachelle Hora Croitoru MD Electronically signed by Thurmon Fair MD Signature Date/Time: 07/21/2023/5:42:31 PM    Final    DG C-Arm 1-60 Min-No Report Result Date: 07/21/2023 Fluoroscopy was utilized by the requesting physician.  No radiographic interpretation.   CT ABDOMEN PELVIS W CONTRAST Result Date: 07/20/2023 CLINICAL DATA:  Possible UTI.  Abdominal pain.  Confusion. EXAM: CT ABDOMEN AND PELVIS WITH CONTRAST TECHNIQUE: Multidetector CT imaging of the abdomen and pelvis was performed using the standard protocol following bolus administration of intravenous contrast. RADIATION DOSE REDUCTION: This exam was performed according to the departmental dose-optimization program which includes automated exposure control, adjustment of the mA and/or kV according to patient size and/or use of iterative reconstruction technique. CONTRAST:   OMNIPAQUE IOHEXOL 300 MG/ML  SOLN COMPARISON:  01/02/2023 FINDINGS: Lower chest: Bibasilar atelectasis/scarring.  No acute abnormality. Hepatobiliary: Scattered hepatic cysts. Gallbladder and biliary tree are unremarkable. Pancreas: Unremarkable. Spleen: Unremarkable. Adrenals/Urinary Tract: Normal adrenal glands. Delayed right nephrogram. Interval migration of the 8 mm stone from the lower pole of the right kidney into the proximal right ureter. Moderate hydroureteronephrosis upstream from the stone. Urothelial thickening and hyperenhancement with peripelvic and periureteral stranding about the right renal pelvis and ureter. No acute abnormality in the left kidney or ureter. Diffuse bladder wall thickening. Stomach/Bowel: Stomach is within normal limits. No bowel obstruction. Large colonic stool burden. No bowel wall thickening. Vascular/Lymphatic: Aortic atherosclerosis. No enlarged abdominal or pelvic lymph nodes. Reproductive: Unremarkable. Other: No organized fluid collection or abscess. No free intraperitoneal air. Musculoskeletal: No acute fracture. Right THA. Partially visualized posterior fusion of the thoracic spine. IMPRESSION: 1. Interval migration of the 8 mm stone from the lower pole of the right kidney into the proximal right ureter. Moderate hydroureteronephrosis upstream from the stone. 2. Urothelial thickening and hyperenhancement with peripelvic and periureteral stranding about the right renal pelvis and ureter consistent with urinary tract infection/inflammation. 3. Cystitis. 4. Constipation. 5. Aortic Atherosclerosis (ICD10-I70.0). Electronically Signed   By: Minerva Fester M.D.   On: 07/20/2023 20:42   CT Head Wo Contrast Result Date: 07/20/2023 CLINICAL DATA:  Mental status change, unknown cause EXAM: CT HEAD WITHOUT CONTRAST TECHNIQUE: Contiguous  axial images were obtained from the base of the skull through the vertex without intravenous contrast. RADIATION DOSE REDUCTION: This exam  was performed according to the departmental dose-optimization program which includes automated exposure control, adjustment of the mA and/or kV according to patient size and/or use of iterative reconstruction technique. COMPARISON:  MRI head 10/20/2022 and CT head 01/02/2023 FINDINGS: Brain: No intracranial hemorrhage, mass effect, or evidence of acute infarct. No hydrocephalus. No extra-axial fluid collection. Age-commensurate cerebral atrophy and chronic small vessel ischemic disease. Vascular: No hyperdense vessel. Intracranial arterial calcification. Skull: No fracture or focal lesion. Sinuses/Orbits: No acute finding. Other: None. IMPRESSION: No acute intracranial abnormality. Electronically Signed   By: Minerva Fester M.D.   On: 07/20/2023 20:29   DG Chest Port 1 View Result Date: 07/20/2023 CLINICAL DATA:  Fever, altered mental status. EXAM: PORTABLE CHEST 1 VIEW COMPARISON:  January 15, 2023. FINDINGS: Stable cardiomediastinal silhouette. No acute pulmonary disease is noted. Status post surgical posterior fusion of midthoracic spine. IMPRESSION: No active disease. Electronically Signed   By: Lupita Raider M.D.   On: 07/20/2023 19:15      LOS: 2 days   Elmon Kirschner, NP [facility] Pager: [phone]  07/22/2023, 9:56 AM   Will arrange outpatient f/u for definitive treatment of stone.  Matt R. Kearston Putman MD [facility]  Pager: [phone]

## 2023-07-22 NOTE — Progress Notes (Signed)
   Patient Name: VED MARTOS Date of Encounter: 07/22/2023 [facility] Cardiologist: None   Interval Summary  .    Phenylephrine off.  Awake and alert today, but wife not in room. No CP, no SOB  Vital Signs .    Vitals:   07/22/23 0400 07/22/23 0511 07/22/23 0600 07/22/23 0800  BP: (!) 91/45 (!) 101/44 (!) 93/49   Pulse: 72 73 74   Resp: 17 17 17    Temp:    98.5 F (36.9 C)  TempSrc:    Oral  SpO2: 94% 94% 95%   Weight:      Height:        Intake/Output Summary (Last 24 hours) at 07/22/2023 0911 Last data filed at 07/22/2023 0841 Gross per 24 hour  Intake 3577.57 ml  Output 2450 ml  Net 1127.57 ml      07/21/2023    9:31 AM 07/21/2023    6:45 AM 07/20/2023    6:09 PM  Last 3 Weights  Weight (lbs) 183 lb 3.2 oz 170 lb 170 lb  Weight (kg) 83.1 kg 77.111 kg 77.111 kg      Telemetry/ECG    SR - Personally Reviewed  Physical Exam .   GEN: No acute distress.   Neck: No JVD Cardiac: RRR, no murmurs, rubs, or gallops.  Respiratory: Clear to auscultation bilaterally. GI: Soft, nontender, non-distended  MS: No edema  Assessment & Plan .     Non-STEMI -Presented with acute metabolic encephalopathy in the setting of sepsis due to obstructive uropathy/pyelonephritis, remains on phenylephrine gtt currently  -Lab work revealing leukocytosis, high sensitive troponin 13 >384>606, AKI with creatinine up to 1.25 so far -EKG without acute ischemic change comparing to old EKGs  -Suspect this is demand ischemia -EF 45-50% on echo with global hypokinesis. No room for GDMT at this time. Given age and frailty with current sepsis, would defer invasive procedures at this time. Troponin trending down.  - will check lipid and A1C for risk stratification, may start ASA 81mg  if cleared by urology for the time being     Sepsis 2/2 obstructing right proximal ureteralstone Acute metabolic encephalopathy  AKI Neurogenic bladder Paraplegia  DM BPH Hypothyroidism - per  primary team   I will call and update wife this afternoon, otherwise, cardiology will follow peripherally while in hospital and reengage if BP improved or sepsis resolved.   For questions or updates, please contact [facility] Please consult [email or web address] for contact info under        Signed, Parke Poisson, MD
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
## 2023-07-23 DIAGNOSIS — N201 Calculus of ureter: Secondary | ICD-10-CM | POA: Diagnosis not present

## 2023-07-23 LAB — GLUCOSE, CAPILLARY
Glucose-Capillary: 142 mg/dL — ABNORMAL HIGH (ref 70–99)
Glucose-Capillary: 143 mg/dL — ABNORMAL HIGH (ref 70–99)
Glucose-Capillary: 147 mg/dL — ABNORMAL HIGH (ref 70–99)
Glucose-Capillary: 227 mg/dL — ABNORMAL HIGH (ref 70–99)

## 2023-07-23 LAB — BASIC METABOLIC PANEL WITH GFR
Anion gap: 8 (ref 5–15)
BUN: 25 mg/dL — ABNORMAL HIGH (ref 8–23)
CO2: 20 mmol/L — ABNORMAL LOW (ref 22–32)
Calcium: 8.3 mg/dL — ABNORMAL LOW (ref 8.9–10.3)
Chloride: 108 mmol/L (ref 98–111)
Creatinine, Ser: 0.87 mg/dL (ref 0.61–1.24)
GFR, Estimated: >60 mL/min (ref >60)
Glucose, Bld: 172 mg/dL — ABNORMAL HIGH (ref 70–99)
Potassium: 4 mmol/L (ref 3.5–5.1)
Sodium: 136 mmol/L (ref 135–145)

## 2023-07-23 LAB — CBC WITH DIFFERENTIAL/PLATELET
Abs Immature Granulocytes: 0.19 10*3/uL — ABNORMAL HIGH (ref 0.00–0.07)
Basophils Absolute: 0 10*3/uL (ref 0.0–0.1)
Basophils Relative: 0 %
Eosinophils Absolute: 0 10*3/uL (ref 0.0–0.5)
Eosinophils Relative: 0 %
HCT: 38.6 % — ABNORMAL LOW (ref 39.0–52.0)
Hemoglobin: 12.6 g/dL — ABNORMAL LOW (ref 13.0–17.0)
Immature Granulocytes: 1 %
Lymphocytes Relative: 8 %
Lymphs Abs: 1.1 10*3/uL (ref 0.7–4.0)
MCH: 32.3 pg (ref 26.0–34.0)
MCHC: 32.6 g/dL (ref 30.0–36.0)
MCV: 99 fL (ref 80.0–100.0)
Monocytes Absolute: 0.6 10*3/uL (ref 0.1–1.0)
Monocytes Relative: 4 %
Neutro Abs: 12.1 10*3/uL — ABNORMAL HIGH (ref 1.7–7.7)
Neutrophils Relative %: 87 %
Platelets: 115 10*3/uL — ABNORMAL LOW (ref 150–400)
RBC: 3.9 MIL/uL — ABNORMAL LOW (ref 4.22–5.81)
RDW: 14.1 % (ref 11.5–15.5)
Smear Review: NORMAL
WBC Morphology: INCREASED
WBC: 14 10*3/uL — ABNORMAL HIGH (ref 4.0–10.5)
nRBC: 0 % (ref 0.0–0.2)

## 2023-07-23 LAB — CULTURE, BLOOD (ROUTINE X 2)

## 2023-07-23 LAB — MAGNESIUM: Magnesium: 1.9 mg/dL (ref 1.7–2.4)

## 2023-07-23 MED ORDER — BACLOFEN 10 MG PO TABS
20.0000 mg | ORAL_TABLET | Freq: Once | ORAL | Status: AC
Start: 2023-07-23 — End: 2023-07-23
  Administered 2023-07-23: 20 mg via ORAL
  Filled 2023-07-23: qty 2

## 2023-07-23 MED ORDER — BACLOFEN 10 MG PO TABS
15.0000 mg | ORAL_TABLET | Freq: Once | ORAL | Status: AC
  Administered 2023-07-23: 15 mg via ORAL
  Filled 2023-07-23: qty 2

## 2023-07-23 MED ORDER — MELATONIN 5 MG PO TABS
5.0000 mg | ORAL_TABLET | Freq: Every evening | ORAL | Status: DC | PRN
  Administered 2023-07-23 – 2023-07-24 (×2): 5 mg via ORAL
  Filled 2023-07-23 (×2): qty 1
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
## 2023-07-23 NOTE — Progress Notes (Signed)
 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   #Ureteral stone #neurogenic bladder #pseudomonal bacteremia/UTI  S/p right ureteral stent with [facility] on 07/21/23. Follow up closely for definitive stone mgmt in 2 weeks.    Contacted by primary team to discuss potential SPT as the wife had been waking up every 4 hours to cath him and the sleep deprivation over the last year had been taking a toll on them both.  His granddaughter is a Publishing rights manager with the [employer].  Long conversation with her this morning. Apparently every 4 hour CIC was not ordered [facility] to be done overnight, but out of anxiousness, despite their best efforts, she has continued to do this.  That being said, suprapubic tube is still reasonable.  They would like to establish care with [facility] in [place] and discuss SPT placement along with definitive stone management in the near future. Pt is likely to be placed in assisted living soon and CIC won't be a viable option.   Otherwise, patient looks well this morning and is progressing as expected.  We will continue to check on him intermittently.  Please feel free to call with questions.    Intake/Output from previous day: 04/01 0701 - 04/02 0700 In: 1862.4 [I.V.:1563; IV Piggyback:299.3] Out: 3550 [Urine:3550]  Intake/Output this shift: No intake/output data recorded.  Physical Exam:  General: Alert and oriented CV: No cyanosis Lungs: equal chest rise Abdomen: Soft, NTND, no rebound or guarding Gu: foley in place draining clear yellow urine  Lab Results: Recent Labs    07/21/23 0517 07/22/23 0318  07/23/23 0302  HGB 14.3 12.0* 12.6*  HCT 44.8 36.9* 38.6*   BMET Recent Labs    07/22/23 0318 07/23/23 0302  NA 134* 136  K 4.2 4.0  CL 105 108  CO2 23 20*  GLUCOSE 163* 172*  BUN 25* 25*  CREATININE 0.77 0.87  CALCIUM 8.3* 8.3*     Studies/Results: ECHOCARDIOGRAM COMPLETE Result Date: 07/21/2023    ECHOCARDIOGRAM REPORT   Patient Name:   Edward Waller Date of Exam: 07/21/2023 Medical Rec #:  [id number]       Height:       72.0 in Accession #:    [id number]      Weight:       183.2 lb Date of Birth:  12-Feb-1933       BSA:          2.053 m Patient Age:    89 years        BP:           121/55 mmHg Patient Gender: M               HR:           59 bpm. Exam Location:  Inpatient Procedure: 2D Echo, Cardiac Doppler and Color Doppler (Both Spectral and Color            Flow Doppler were utilized during procedure). Indications:    R07.9* Chest pain, unspecified. Elevated troponin.  History:        Patient has no prior history of Echocardiogram examinations.  Signs/Symptoms:Bacteremia and Chest Pain; Risk Factors:Diabetes.  Sonographer:    Sheralyn Boatman RDCS Referring Phys: [id number] [facility] IMPRESSIONS  1. Left ventricular ejection fraction, by estimation, is 45 to 50%. The left ventricle has low normal function. The left ventricle demonstrates global hypokinesis. Left ventricular diastolic parameters are consistent with Grade I diastolic dysfunction (impaired relaxation).  2. Right ventricular systolic function is normal. The right ventricular size is normal. There is mildly elevated pulmonary artery systolic pressure.  3. The mitral valve is degenerative. Trivial mitral valve regurgitation. No evidence of mitral stenosis.  4. The aortic valve is tricuspid. There is mild calcification of the aortic valve. Aortic valve regurgitation is not visualized. Aortic valve sclerosis/calcification is present, without any evidence of aortic stenosis.  5. The inferior vena cava is dilated in size with  <50% respiratory variability, suggesting right atrial pressure of 15 mmHg. Comparison(s): No prior Echocardiogram. Conclusion(s)/Recommendation(s): No evidence of valvular vegetations on this transthoracic echocardiogram. Consider a transesophageal echocardiogram to exclude infective endocarditis if clinically indicated. FINDINGS  Left Ventricle: Left ventricular ejection fraction, by estimation, is 45 to 50%. The left ventricle has low normal function. The left ventricle demonstrates global hypokinesis. The left ventricular internal cavity size was normal in size. There is no left ventricular hypertrophy. Left ventricular diastolic parameters are consistent with Grade I diastolic dysfunction (impaired relaxation). Right Ventricle: The right ventricular size is normal. No increase in right ventricular wall thickness. Right ventricular systolic function is normal. There is mildly elevated pulmonary artery systolic pressure. The tricuspid regurgitant velocity is 2.31  m/s, and with an assumed right atrial pressure of 15 mmHg, the estimated right ventricular systolic pressure is 36.3 mmHg. Left Atrium: Left atrial size was normal in size. Right Atrium: Right atrial size was normal in size. Pericardium: There is no evidence of pericardial effusion. Mitral Valve: The mitral valve is degenerative in appearance. Trivial mitral valve regurgitation. No evidence of mitral valve stenosis. Tricuspid Valve: The tricuspid valve is normal in structure. Tricuspid valve regurgitation is trivial. No evidence of tricuspid stenosis. Aortic Valve: The aortic valve is tricuspid. There is mild calcification of the aortic valve. Aortic valve regurgitation is not visualized. Aortic valve sclerosis/calcification is present, without any evidence of aortic stenosis. Pulmonic Valve: The pulmonic valve was normal in structure. Pulmonic valve regurgitation is not visualized. No evidence of pulmonic stenosis. Aorta: The aortic root and ascending  aorta are structurally normal, with no evidence of dilitation. Venous: The inferior vena cava is dilated in size with less than 50% respiratory variability, suggesting right atrial pressure of 15 mmHg. IAS/Shunts: No atrial level shunt detected by color flow Doppler.  LEFT VENTRICLE PLAX 2D LVIDd:         5.00 cm      Diastology LVIDs:         3.80 cm      LV e' medial:    4.79 cm/s LV PW:         1.10 cm      LV E/e' medial:  15.5 LV IVS:        1.00 cm      LV e' lateral:   7.62 cm/s LVOT diam:     2.50 cm      LV E/e' lateral: 9.7 LV SV:         86 LV SV Index:   42 LVOT Area:     4.91 cm  LV Volumes (MOD) LV vol d, MOD A2C: 125.0 ml LV vol d, MOD A4C:  87.6 ml LV vol s, MOD A2C: 53.3 ml LV vol s, MOD A4C: 42.4 ml LV SV MOD A2C:     71.7 ml LV SV MOD A4C:     87.6 ml LV SV MOD BP:      59.6 ml RIGHT VENTRICLE            IVC RV S prime:     8.70 cm/s  IVC diam: 2.90 cm TAPSE (M-mode): 1.8 cm LEFT ATRIUM             Index        RIGHT ATRIUM           Index LA diam:        2.60 cm 1.27 cm/m   RA Area:     15.80 cm LA Vol (A2C):   24.5 ml 11.94 ml/m  RA Volume:   39.50 ml  19.24 ml/m LA Vol (A4C):   16.8 ml 8.18 ml/m LA Biplane Vol: 20.1 ml 9.79 ml/m  AORTIC VALVE LVOT Vmax:   83.90 cm/s LVOT Vmean:  56.300 cm/s LVOT VTI:    0.176 m  AORTA Ao Root diam: 3.70 cm Ao Asc diam:  3.60 cm MITRAL VALVE                TRICUSPID VALVE MV Area (PHT): 3.88 cm     TR Peak grad:   21.3 mmHg MV Decel Time: 196 msec     TR Vmax:        231.00 cm/s MV E velocity: 74.05 cm/s MV A velocity: 109.00 cm/s  SHUNTS MV E/A ratio:  0.68         Systemic VTI:  0.18 m                             Systemic Diam: 2.50 cm Rachelle Hora Croitoru MD Electronically signed by Thurmon Fair MD Signature Date/Time: 07/21/2023/5:42:31 PM    Final       LOS: 3 days   Elmon Kirschner, NP [facility] Pager: [phone]  07/23/2023, 8:24 AM   Will arrange outpatient f/u for definitive treatment of stone.  Matt R. Gay  MD [facility]  Pager: [phone]

## 2023-07-23 NOTE — Plan of Care (Signed)
  Problem: Coping: Goal: Ability to adjust to condition or change in health will improve Outcome: Progressing   Problem: Fluid Volume: Goal: Ability to maintain a balanced intake and output will improve Outcome: Progressing   Problem: Education: Goal: Knowledge of General Education information will improve Description: Including pain rating scale, medication(s)/side effects and non-pharmacologic comfort measures Outcome: Progressing   Problem: Clinical Measurements: Goal: Cardiovascular complication will be avoided Outcome: Progressing   Problem: Nutrition: Goal: Adequate nutrition will be maintained Outcome: Progressing   Problem: Coping: Goal: Level of anxiety will decrease Outcome: Progressing   Problem: Elimination: Goal: Will not experience complications related to bowel motility Outcome: Progressing   Problem: Pain Managment: Goal: General experience of comfort will improve and/or be controlled Outcome: Progressing   Problem: Safety: Goal: Ability to remain free from injury will improve Outcome: Progressing

## 2023-07-24 DIAGNOSIS — N201 Calculus of ureter: Secondary | ICD-10-CM | POA: Diagnosis not present

## 2023-07-24 LAB — CBC WITH DIFFERENTIAL/PLATELET
Abs Immature Granulocytes: 0.09 10*3/uL — ABNORMAL HIGH (ref 0.00–0.07)
Basophils Absolute: 0.1 10*3/uL (ref 0.0–0.1)
Basophils Relative: 1 %
Eosinophils Absolute: 0.1 10*3/uL (ref 0.0–0.5)
Eosinophils Relative: 1 %
HCT: 41.8 % (ref 39.0–52.0)
Hemoglobin: 13.6 g/dL (ref 13.0–17.0)
Immature Granulocytes: 1 %
Lymphocytes Relative: 14 %
Lymphs Abs: 1.5 10*3/uL (ref 0.7–4.0)
MCH: 32 pg (ref 26.0–34.0)
MCHC: 32.5 g/dL (ref 30.0–36.0)
MCV: 98.4 fL (ref 80.0–100.0)
Monocytes Absolute: 0.6 10*3/uL (ref 0.1–1.0)
Monocytes Relative: 6 %
Neutro Abs: 8.4 10*3/uL — ABNORMAL HIGH (ref 1.7–7.7)
Neutrophils Relative %: 77 %
Platelets: 141 10*3/uL — ABNORMAL LOW (ref 150–400)
RBC: 4.25 MIL/uL (ref 4.22–5.81)
RDW: 13.6 % (ref 11.5–15.5)
WBC: 10.7 10*3/uL — ABNORMAL HIGH (ref 4.0–10.5)
nRBC: 0 % (ref 0.0–0.2)

## 2023-07-24 LAB — GLUCOSE, CAPILLARY
Glucose-Capillary: 130 mg/dL — ABNORMAL HIGH (ref 70–99)
Glucose-Capillary: 157 mg/dL — ABNORMAL HIGH (ref 70–99)
Glucose-Capillary: 104 mg/dL — ABNORMAL HIGH (ref 70–99)
Glucose-Capillary: 161 mg/dL — ABNORMAL HIGH (ref 70–99)

## 2023-07-24 LAB — BASIC METABOLIC PANEL WITH GFR
Anion gap: 6 (ref 5–15)
BUN: 20 mg/dL (ref 8–23)
CO2: 24 mmol/L (ref 22–32)
Calcium: 8.4 mg/dL — ABNORMAL LOW (ref 8.9–10.3)
Chloride: 105 mmol/L (ref 98–111)
Creatinine, Ser: 0.75 mg/dL (ref 0.61–1.24)
GFR, Estimated: >60 mL/min (ref >60)
Glucose, Bld: 128 mg/dL — ABNORMAL HIGH (ref 70–99)
Potassium: 3.9 mmol/L (ref 3.5–5.1)
Sodium: 135 mmol/L (ref 135–145)

## 2023-07-24 LAB — MAGNESIUM: Magnesium: 1.8 mg/dL (ref 1.7–2.4)

## 2023-07-24 MED ORDER — FLUTICASONE PROPIONATE 50 MCG/ACT NA SUSP: 1.0000 | Freq: Every day | NASAL | Status: DC | PRN

## 2023-07-24 MED ORDER — MIRABEGRON ER 25 MG PO TB24
50.0000 mg | ORAL_TABLET | Freq: Every day | ORAL | Status: DC
  Administered 2023-07-25 – 2023-07-26 (×2): 50 mg via ORAL
  Filled 2023-07-24 (×3): qty 2

## 2023-07-24 MED ORDER — BISACODYL 10 MG RE SUPP
10.0000 mg | Freq: Once | RECTAL | Status: AC
  Administered 2023-07-25: 10 mg via RECTAL
  Filled 2023-07-24: qty 1

## 2023-07-24 MED ORDER — METOPROLOL SUCCINATE ER 25 MG PO TB24
12.5000 mg | ORAL_TABLET | Freq: Every day | ORAL | Status: DC
  Administered 2023-07-24 – 2023-07-26 (×3): 12.5 mg via ORAL
  Filled 2023-07-24 (×3): qty 1

## 2023-07-24 MED ORDER — ADULT MULTIVITAMIN W/MINERALS CH
1.0000 | ORAL_TABLET | Freq: Every morning | ORAL | Status: DC
  Administered 2023-07-25 – 2023-07-26 (×2): 1 via ORAL
  Filled 2023-07-24 (×2): qty 1

## 2023-07-24 MED ORDER — POLYETHYLENE GLYCOL 3350 17 G PO PACK
17.0000 g | PACK | Freq: Every day | ORAL | Status: DC | PRN
  Administered 2023-07-24: 17 g via ORAL
  Filled 2023-07-24: qty 1

## 2023-07-24 MED ORDER — POLYVINYL ALCOHOL 1.4 % OP SOLN: 1.0000 [drp] | OPHTHALMIC | Status: DC | PRN

## 2023-07-24 MED ORDER — BACLOFEN 20 MG PO TABS
25.0000 mg | ORAL_TABLET | Freq: Every day | ORAL | Status: DC
  Administered 2023-07-24 – 2023-07-25 (×2): 25 mg via ORAL
  Filled 2023-07-24 (×2): qty 1
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
## 2023-07-24 NOTE — TOC Progression Note (Signed)
 Transition of Care [facility]) - Progression Note    Patient Details  Name: Edward Waller MRN: [id number] Date of Birth: January 22, 1933  Transition [facility]) CM/SW Contact  Larrie Kass, LCSW Phone Number: 07/24/2023, 3:15 PM  Clinical Narrative:    CSW spoke with the pt and the pt's granddaughter to discuss PT recommendations for home health services. pt's granddaughter is requesting SNF placement and inquired if PT can reevaluate the pt for SNF. CSW received a message from PT stating that the pt is not a skilled rehab candidate, and they will maintain their recommendation for home health services.  Pt's granddaughter reported that the pt receives 21 hours of aide services a week from the [place]. She stated that her grandmother mentioned not receiving the necessary care from the aides who come to the home. Pt has had three different personal care agencies come to the home, and the pt's wife dislikes the care provided by each. Pt's granddaughter expressed concerns about taking the pt home. She mentioned that she is currently working on getting the pt's Medicaid in place for LTC. CSW informed the pt's granddaughter that she may need to try a different personal care agency. TOC to follow.       Expected Discharge Plan: Home w Home Health Services Barriers to Discharge: Continued Medical Work up  Expected Discharge Plan and Services In-house Referral: Clinical Social Work Discharge Planning Services: NA Post Acute Care Choice: Skilled Nursing Facility Living arrangements for the past 2 months: Single Family Home                 DME Arranged: N/A DME Agency: NA                   Social Determinants of Health (SDOH) Interventions SDOH Screenings   Food Insecurity: No Food Insecurity (07/20/2023)  Housing: Low Risk  (07/20/2023)  Transportation Needs: No Transportation Needs (07/20/2023)  Utilities: Not At Risk (07/20/2023)  Depression (PHQ2-9): Low Risk  (05/16/2020)  Social  Connections: Socially Integrated (07/20/2023)  Tobacco Use: Low Risk  (07/21/2023)    Readmission Risk Interventions    07/22/2023    2:34 PM  Readmission Risk Prevention Plan  Transportation Screening Complete  PCP or Specialist Appt within 3-5 Days Complete  HRI or Home Care Consult Complete  Social Work Consult for Recovery Care Planning/Counseling Complete  Palliative Care Screening Not Applicable  Medication Review Oceanographer) Complete

## 2023-07-24 NOTE — Plan of Care (Signed)

## 2023-07-24 NOTE — Progress Notes (Signed)
   Patient Name: Edward Waller Date of Encounter: 07/24/2023 [facility] Cardiologist: Parke Poisson, MD   Interval Summary  .     Awake and alert today, No CP, no SOB. BP improved.  Vital Signs .    Vitals:   07/23/23 1734 07/23/23 2041 07/24/23 0438 07/24/23 1240  BP: (!) 149/86 (!) 153/79 (!) 153/84 131/72  Pulse: 77 78 71 76  Resp: 20 19 19 14   Temp: 99.7 F (37.6 C) 98.8 F (37.1 C) 98.4 F (36.9 C) 98.2 F (36.8 C)  TempSrc: Oral   Oral  SpO2: 94% 93% 94% 95%  Weight:      Height:        Intake/Output Summary (Last 24 hours) at 07/24/2023 1825 Last data filed at 07/24/2023 1754 Gross per 24 hour  Intake 660 ml  Output 3700 ml  Net -3040 ml      07/21/2023    9:31 AM 07/21/2023    6:45 AM 07/20/2023    6:09 PM  Last 3 Weights  Weight (lbs) 183 lb 3.2 oz 170 lb 170 lb  Weight (kg) 83.1 kg 77.111 kg 77.111 kg      Telemetry/ECG    SR - Personally Reviewed  Physical Exam .   GEN: No acute distress.   Neck: No JVD Cardiac: RRR, no murmurs, rubs, or gallops.  Respiratory: Clear to auscultation bilaterally. GI: Soft, nontender, non-distended  MS: No edema  Assessment & Plan .     Non-STEMI -Presented with acute metabolic encephalopathy in the setting of sepsis due to obstructive uropathy/pyelonephritis, remains on phenylephrine gtt currently  -Lab work revealing leukocytosis, high sensitive troponin 13 >384>606, AKI improved. -EKG without acute ischemic change comparing to old EKGs  -Suspect this is demand ischemia -EF 45-50% on echo with global hypokinesis. Given age and frailty with current sepsis, would defer invasive procedures at this time. Troponin trending down. Pt and wife agree.  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  - ASA 81mg     Sepsis 2/2 obstructing right proximal ureteralstone Acute metabolic encephalopathy  AKI Neurogenic bladder Paraplegia   DM BPH Hypothyroidism - per primary team      For questions or updates, please contact [facility] Please consult [email or web address] for contact info under        Signed, Parke Poisson, MD
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
## 2023-07-25 ENCOUNTER — Other Ambulatory Visit: Payer: Self-pay | Admitting: Student

## 2023-07-25 ENCOUNTER — Inpatient Hospital Stay (HOSPITAL_COMMUNITY)

## 2023-07-25 DIAGNOSIS — I214 Non-ST elevation (NSTEMI) myocardial infarction: Secondary | ICD-10-CM

## 2023-07-25 DIAGNOSIS — N201 Calculus of ureter: Secondary | ICD-10-CM | POA: Diagnosis not present

## 2023-07-25 LAB — CULTURE, BLOOD (ROUTINE X 2): Culture: NO GROWTH

## 2023-07-25 LAB — MAGNESIUM: Magnesium: 2.1 mg/dL (ref 1.7–2.4)

## 2023-07-25 LAB — GLUCOSE, CAPILLARY
Glucose-Capillary: 141 mg/dL — ABNORMAL HIGH (ref 70–99)
Glucose-Capillary: 158 mg/dL — ABNORMAL HIGH (ref 70–99)
Glucose-Capillary: 133 mg/dL — ABNORMAL HIGH (ref 70–99)
Glucose-Capillary: 164 mg/dL — ABNORMAL HIGH (ref 70–99)

## 2023-07-25 LAB — CBC WITH DIFFERENTIAL/PLATELET
Abs Immature Granulocytes: 0.16 10*3/uL — ABNORMAL HIGH (ref 0.00–0.07)
Basophils Absolute: 0.1 10*3/uL (ref 0.0–0.1)
Basophils Relative: 1 %
Eosinophils Absolute: 0.2 10*3/uL (ref 0.0–0.5)
Eosinophils Relative: 2 %
HCT: 41.3 % (ref 39.0–52.0)
Hemoglobin: 13.6 g/dL (ref 13.0–17.0)
Immature Granulocytes: 2 %
Lymphocytes Relative: 17 %
Lymphs Abs: 1.6 10*3/uL (ref 0.7–4.0)
MCH: 31.4 pg (ref 26.0–34.0)
MCHC: 32.9 g/dL (ref 30.0–36.0)
MCV: 95.4 fL (ref 80.0–100.0)
Monocytes Absolute: 0.6 10*3/uL (ref 0.1–1.0)
Monocytes Relative: 6 %
Neutro Abs: 7.1 10*3/uL (ref 1.7–7.7)
Neutrophils Relative %: 72 %
Platelets: 155 10*3/uL (ref 150–400)
RBC: 4.33 MIL/uL (ref 4.22–5.81)
RDW: 13.2 % (ref 11.5–15.5)
WBC: 9.7 10*3/uL (ref 4.0–10.5)
nRBC: 0 % (ref 0.0–0.2)

## 2023-07-25 MED ORDER — GADOBUTROL 1 MMOL/ML IV SOLN
8.0000 mL | Freq: Once | INTRAVENOUS | Status: AC | PRN
  Administered 2023-07-25: 8 mL via INTRAVENOUS

## 2023-07-25 NOTE — Plan of Care (Signed)

## 2023-07-25 NOTE — Progress Notes (Signed)
 Ordered outpatient Echo per Dr. Jacques Navy after recent admission for NSTEMI that was treated medically in setting of sepsis. Please see her rounding notes for more information.  Corrin Parker, PA-C 07/25/2023 2:08 PM

## 2023-07-25 NOTE — Discharge Instructions (Signed)
  You have a follow-up appointment with a provider at [facility] in [place].  We are currently in the process of transitioning from two locations to one.  Effective August 18, 2023, all appointments that were previously scheduled at either our [facility] or [place] locations will be moved to our new location at [address] [place], [place], [address].  The phone number for our new location will be [phone].
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
## 2023-07-25 NOTE — Progress Notes (Signed)
 No new cardiology recommendations today, hemodynamics stable after addition of metoprolol yesterday.   We will arrange follow up for patient.  Cardiology will sign off at this time.   Parke Poisson, MD

## 2023-07-26 ENCOUNTER — Other Ambulatory Visit (HOSPITAL_COMMUNITY): Payer: Self-pay

## 2023-07-26 DIAGNOSIS — N201 Calculus of ureter: Secondary | ICD-10-CM | POA: Diagnosis not present

## 2023-07-26 LAB — CBC WITH DIFFERENTIAL/PLATELET
Abs Immature Granulocytes: 0.32 10*3/uL — ABNORMAL HIGH (ref 0.00–0.07)
Basophils Absolute: 0.1 10*3/uL (ref 0.0–0.1)
Basophils Relative: 1 %
Eosinophils Absolute: 0.3 10*3/uL (ref 0.0–0.5)
Eosinophils Relative: 3 %
HCT: 43.3 % (ref 39.0–52.0)
Hemoglobin: 14.2 g/dL (ref 13.0–17.0)
Immature Granulocytes: 4 %
Lymphocytes Relative: 21 %
Lymphs Abs: 2 10*3/uL (ref 0.7–4.0)
MCH: 32.3 pg (ref 26.0–34.0)
MCHC: 32.8 g/dL (ref 30.0–36.0)
MCV: 98.6 fL (ref 80.0–100.0)
Monocytes Absolute: 0.6 10*3/uL (ref 0.1–1.0)
Monocytes Relative: 7 %
Neutro Abs: 5.9 10*3/uL (ref 1.7–7.7)
Neutrophils Relative %: 64 %
Platelets: 203 10*3/uL (ref 150–400)
RBC: 4.39 MIL/uL (ref 4.22–5.81)
RDW: 13.4 % (ref 11.5–15.5)
WBC: 9.2 10*3/uL (ref 4.0–10.5)
nRBC: 0 % (ref 0.0–0.2)

## 2023-07-26 LAB — GLUCOSE, CAPILLARY
Glucose-Capillary: 137 mg/dL — ABNORMAL HIGH (ref 70–99)
Glucose-Capillary: 142 mg/dL — ABNORMAL HIGH (ref 70–99)
Glucose-Capillary: 129 mg/dL — ABNORMAL HIGH (ref 70–99)

## 2023-07-26 LAB — MAGNESIUM: Magnesium: 1.9 mg/dL (ref 1.7–2.4)

## 2023-07-26 MED ORDER — METOPROLOL SUCCINATE ER 25 MG PO TB24
12.5000 mg | ORAL_TABLET | Freq: Every day | ORAL | 0 refills | Status: DC
  Filled 2023-07-26: qty 15, 30d supply, fill #0

## 2023-07-26 MED ORDER — CIPROFLOXACIN HCL 750 MG PO TABS
750.0000 mg | ORAL_TABLET | Freq: Two times a day (BID) | ORAL | 0 refills | Status: AC
  Filled 2023-07-26: qty 22, 11d supply, fill #0

## 2023-07-26 MED ORDER — ASPIRIN 81 MG PO TBEC
81.0000 mg | DELAYED_RELEASE_TABLET | Freq: Every day | ORAL | 12 refills | Status: AC
  Filled 2023-07-26: qty 30, 30d supply, fill #0

## 2023-07-26 NOTE — Progress Notes (Signed)
 TOC discharge meds in a secure bag delivered to pt in room by this RN

## 2023-07-26 NOTE — Discharge Summary (Addendum)
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 thickness. Right ventricular systolic function is normal. There is mildly elevated pulmonary artery systolic pressure. The tricuspid regurgitant velocity is 2.31  m/s, and with an assumed right atrial pressure of 15 mmHg, the estimated right ventricular systolic pressure is 36.3 mmHg. Left Atrium: Left atrial size was normal in size. Right Atrium: Right atrial size was normal in size. Pericardium: There is no evidence of  pericardial effusion. Mitral Valve: The mitral valve is degenerative in appearance. Trivial mitral valve regurgitation. No evidence of mitral valve stenosis. Tricuspid Valve: The tricuspid valve is normal in structure. Tricuspid valve regurgitation is trivial. No evidence of tricuspid stenosis. Aortic Valve: The aortic valve is tricuspid. There is mild calcification of the aortic valve. Aortic valve regurgitation is not visualized. Aortic valve sclerosis/calcification is present, without any evidence of aortic stenosis. Pulmonic Valve: The pulmonic valve was normal in structure. Pulmonic valve regurgitation is not visualized. No evidence of pulmonic stenosis. Aorta: The aortic root and ascending aorta are structurally normal, with no evidence of dilitation. Venous: The inferior vena cava is dilated in size with less than 50% respiratory variability, suggesting right atrial pressure of 15 mmHg. IAS/Shunts: No atrial level shunt detected by color flow Doppler.  LEFT VENTRICLE PLAX 2D LVIDd:         5.00 cm      Diastology LVIDs:         3.80 cm      LV e' medial:    4.79 cm/s LV PW:         1.10 cm      LV E/e' medial:  15.5 LV IVS:        1.00 cm      LV e' lateral:   7.62 cm/s LVOT diam:     2.50 cm      LV E/e' lateral: 9.7 LV SV:         86 LV SV Index:   42 LVOT Area:     4.91 cm  LV Volumes (MOD) LV vol d, MOD A2C: 125.0 ml LV vol d, MOD A4C: 87.6 ml LV vol s, MOD A2C: 53.3 ml LV vol s, MOD A4C: 42.4 ml LV SV MOD A2C:     71.7 ml LV SV MOD A4C:     87.6 ml LV SV MOD BP:      59.6 ml RIGHT VENTRICLE            IVC RV S prime:     8.70 cm/s  IVC diam: 2.90 cm TAPSE (M-mode): 1.8 cm LEFT ATRIUM             Index        RIGHT ATRIUM           Index LA diam:        2.60 cm 1.27 cm/m   RA Area:     15.80 cm LA Vol (A2C):   24.5 ml 11.94 ml/m  RA Volume:   39.50 ml  19.24 ml/m LA Vol (A4C):   16.8 ml 8.18 ml/m LA Biplane Vol: 20.1 ml 9.79 ml/m  AORTIC VALVE LVOT Vmax:   83.90 cm/s LVOT Vmean:  56.300 cm/s LVOT  VTI:    0.176 m  AORTA Ao Root diam: 3.70 cm Ao Asc diam:  3.60 cm MITRAL VALVE                TRICUSPID VALVE MV Area (PHT): 3.88 cm     TR Peak grad:   21.3 mmHg MV Decel Time: 196 msec     TR Vmax:        231.00 cm/s MV E velocity: 74.05 cm/s MV A velocity: 109.00 cm/s  SHUNTS MV E/A ratio:  0.68         Systemic VTI:  0.18 m                             Systemic Diam: 2.50 cm Rachelle Hora Croitoru MD Electronically signed by Thurmon Fair MD Signature Date/Time: 07/21/2023/5:42:31 PM    Final    DG C-Arm 1-60 Min-No Report Result Date: 07/21/2023 Fluoroscopy was utilized by the requesting physician.  No radiographic interpretation.   CT ABDOMEN PELVIS W CONTRAST Result Date: 07/20/2023 CLINICAL DATA:  Possible UTI.  Abdominal pain.  Confusion. EXAM: CT ABDOMEN AND PELVIS WITH CONTRAST TECHNIQUE: Multidetector CT imaging of the abdomen and pelvis was performed using the standard protocol following bolus administration of intravenous contrast. RADIATION DOSE REDUCTION: This exam was performed according to the departmental dose-optimization program which includes automated exposure control, adjustment of the mA and/or kV according to patient size and/or use of iterative reconstruction technique. CONTRAST:  OMNIPAQUE IOHEXOL 300 MG/ML  SOLN COMPARISON:  01/02/2023 FINDINGS: Lower chest: Bibasilar atelectasis/scarring.  No acute abnormality. Hepatobiliary: Scattered hepatic cysts. Gallbladder and biliary tree are unremarkable. Pancreas: Unremarkable. Spleen: Unremarkable. Adrenals/Urinary Tract: Normal adrenal glands. Delayed right nephrogram. Interval migration of the 8 mm stone from the lower pole of the right kidney into the proximal right ureter. Moderate hydroureteronephrosis upstream from the stone. Urothelial thickening and hyperenhancement with peripelvic and periureteral stranding about the right renal pelvis and ureter. No acute abnormality in the left kidney or ureter. Diffuse bladder wall thickening.  Stomach/Bowel: Stomach is within normal limits. No bowel obstruction. Large colonic stool burden. No bowel wall thickening. Vascular/Lymphatic: Aortic atherosclerosis. No enlarged abdominal or pelvic lymph nodes. Reproductive: Unremarkable. Other: No organized fluid collection or abscess. No free intraperitoneal air. Musculoskeletal: No acute fracture. Right THA. Partially visualized posterior fusion of the thoracic spine. IMPRESSION: 1. Interval migration of the 8 mm stone from the lower pole of the right kidney into the proximal right ureter. Moderate hydroureteronephrosis upstream from the stone. 2. Urothelial thickening and hyperenhancement with peripelvic and periureteral stranding about the right renal pelvis and ureter consistent with urinary tract infection/inflammation. 3. Cystitis. 4. Constipation. 5. Aortic Atherosclerosis (ICD10-I70.0). Electronically Signed   By: Minerva Fester M.D.   On: 07/20/2023 20:42   CT Head Wo Contrast Result Date: 07/20/2023 CLINICAL DATA:  Mental status change, unknown cause EXAM: CT HEAD WITHOUT CONTRAST TECHNIQUE: Contiguous axial images were obtained from the base of the skull through the vertex without intravenous contrast. RADIATION DOSE REDUCTION: This exam was performed according to the departmental dose-optimization program which includes automated exposure control, adjustment of the mA and/or kV according to patient size and/or use of iterative reconstruction technique. COMPARISON:  MRI head 10/20/2022 and CT head 01/02/2023 FINDINGS: Brain: No intracranial hemorrhage, mass effect, or evidence of acute infarct. No hydrocephalus. No extra-axial fluid collection. Age-commensurate cerebral atrophy and chronic small vessel ischemic disease. Vascular: No hyperdense vessel. Intracranial arterial calcification. Skull: No fracture or focal lesion. Sinuses/Orbits: No  acute finding. Other: None. IMPRESSION: No acute intracranial abnormality. Electronically Signed   By:  Minerva Fester M.D.   On: 07/20/2023 20:29   DG Chest Port 1 View Result Date: 07/20/2023 CLINICAL DATA:  Fever, altered mental status. EXAM: PORTABLE CHEST 1 VIEW COMPARISON:  January 15, 2023. FINDINGS: Stable cardiomediastinal silhouette. No acute pulmonary disease is noted. Status post surgical posterior fusion of midthoracic spine. IMPRESSION: No active disease. Electronically Signed   By: Lupita Raider M.D.   On: 07/20/2023 19:15  Labs: BNP (last 3 results) No results for input(s): "BNP" in the last 8760 hours. Basic Metabolic Panel: Recent Labs  Lab 07/20/23 1837 07/21/23 0517 07/22/23 0318 07/23/23 0302 07/24/23 0415 07/25/23 0403 07/26/23 0343  NA 134* 133* 134* 136 135  --   --   K 4.4 4.4 4.2 4.0 3.9  --   --   CL 99 103 105 108 105  --   --   CO2 26 19* 23 20* 24  --   --   GLUCOSE 188* 139* 163* 172* 128*  --   --   BUN 23 22 25* 25* 20  --   --   CREATININE 1.16 1.25* 0.77 0.87 0.75  --   --   CALCIUM 9.1 8.6* 8.3* 8.3* 8.4*  --   --   MG  --   --  2.0 1.9 1.8 2.1 1.9   Liver Function Tests: Recent Labs  Lab 07/20/23 1837  AST 16  ALT 11  ALKPHOS 87  BILITOT 1.2  PROT 7.7  ALBUMIN 3.6   Recent Labs  Lab 07/20/23 1837  LIPASE 23   No results for input(s): "AMMONIA" in the last 168 hours. CBC: Recent Labs  Lab 07/22/23 0318 07/23/23 0302 07/24/23 0415 07/25/23 0403 07/26/23 0343  WBC 16.0* 14.0* 10.7* 9.7 9.2  NEUTROABS 13.7* 12.1* 8.4* 7.1 5.9  HGB 12.0* 12.6* 13.6 13.6 14.2  HCT 36.9* 38.6* 41.8 41.3 43.3  MCV 98.4 99.0 98.4 95.4 98.6  PLT 115* 115* 141* 155 203      Component Value Date/Time   COLORURINE YELLOW 07/20/2023 1830   APPEARANCEUR CLOUDY (A) 07/20/2023 1830   LABSPEC 1.014 07/20/2023 1830   PHURINE 5.0 07/20/2023 1830   GLUCOSEU 150 (A) 07/20/2023 1830   HGBUR SMALL (A) 07/20/2023 1830   BILIRUBINUR NEGATIVE 07/20/2023 1830   KETONESUR NEGATIVE 07/20/2023 1830   PROTEINUR NEGATIVE 07/20/2023 1830   NITRITE NEGATIVE  07/20/2023 1830   LEUKOCYTESUR LARGE (A) 07/20/2023 1830   Sepsis Labs Recent Labs  Lab 07/23/23 0302 07/24/23 0415 07/25/23 0403 07/26/23 0343  WBC 14.0* 10.7* 9.7 9.2   Microbiology Recent Results (from the past 240 hours)  Culture, blood (routine x 2)     Status: None   Collection Time: 07/20/23  6:07 PM   Specimen: BLOOD  Result Value Ref Range Status   Specimen Description   Final    BLOOD SITE NOT SPECIFIED Performed at [facility], [address], [place], [place] [address]    Special Requests   Final    BOTTLES DRAWN AEROBIC AND ANAEROBIC Blood Culture results may not be optimal due to an inadequate volume of blood received in culture bottles Performed at [facility], [address] [place], [place] [address]    Culture   Final    NO GROWTH 5 DAYS Performed at [facility], [address] [place], [place] [address]    Report Status 07/25/2023 FINAL  Final  Urine Culture     Status: Abnormal   Collection Time: 07/20/23  6:30 PM   Specimen: Urine, Random  Result Value Ref Range Status   Specimen Description   Final    URINE, RANDOM Performed at [facility], [address], [place], [place] [address]    Special Requests   Final    NONE Reflexed from [id number] Performed at [facility], [address] [place], [place] [address]    Culture >=100,000 COLONIES/mL PSEUDOMONAS AERUGINOSA (A)  Final   Report Status 07/22/2023 FINAL  Final   Organism ID, Bacteria PSEUDOMONAS AERUGINOSA (A)  Final      Susceptibility   Pseudomonas aeruginosa - MIC*    CEFTAZIDIME >=64 RESISTANT Resistant     CIPROFLOXACIN 0.5 SENSITIVE Sensitive     GENTAMICIN 2 SENSITIVE Sensitive     IMIPENEM 1 SENSITIVE Sensitive     * >=100,000 COLONIES/mL PSEUDOMONAS AERUGINOSA  Resp panel by RT-PCR (RSV, Flu A&B, Covid) Anterior Nasal Swab     Status: None   Collection Time: 07/20/23  6:42 PM   Specimen:  Anterior Nasal Swab  Result Value Ref Range Status   SARS Coronavirus 2 by RT PCR NEGATIVE NEGATIVE Final    Comment: (NOTE) SARS-CoV-2 target nucleic acids are NOT DETECTED.  The SARS-CoV-2 RNA is generally detectable in upper respiratory specimens during the acute phase of infection. The lowest concentration of SARS-CoV-2 viral copies this assay can detect is 138 copies/mL. A negative result does not preclude SARS-Cov-2 infection and should not be used as the sole basis for treatment or other patient management decisions. A negative result may occur with  improper specimen collection/handling, submission of specimen other than nasopharyngeal swab, presence of viral mutation(s) within the areas targeted by this assay, and inadequate number of viral copies(<138 copies/mL). A negative result must be combined with clinical observations, patient history, and epidemiological information. The expected result is Negative.  Fact Sheet for Patients:  BloggerCourse.com  Fact Sheet for Healthcare Providers:  SeriousBroker.it  This test is no t yet approved or cleared by the [place] FDA and  has been authorized for detection and/or diagnosis of SARS-CoV-2 by FDA under an Emergency Use Authorization (EUA). This EUA will remain  in effect (meaning this test can be used) for the duration of the COVID-19 declaration under Section 564(b)(1) of the Act, 21 U.S.C.section 360bbb-3(b)(1), unless the authorization is terminated  or revoked sooner.       Influenza A by PCR NEGATIVE NEGATIVE Final   Influenza B by PCR NEGATIVE NEGATIVE Final    Comment: (NOTE) The Xpert Xpress SARS-CoV-2/FLU/RSV plus assay is intended as an aid in the diagnosis of influenza from Nasopharyngeal swab specimens and should not be used as a sole basis for treatment. Nasal washings and aspirates are unacceptable for Xpert Xpress SARS-CoV-2/FLU/RSV testing.  Fact  Sheet for Patients: BloggerCourse.com  Fact Sheet for Healthcare Providers: SeriousBroker.it  This test is not yet approved or cleared by the [place] FDA and has been authorized for detection and/or diagnosis of SARS-CoV-2 by FDA under an Emergency Use Authorization (EUA). This EUA will remain in effect (meaning this test can be used) for the duration of the COVID-19 declaration under Section 564(b)(1) of the Act, 21 U.S.C. section 360bbb-3(b)(1), unless the authorization is terminated or revoked.     Resp Syncytial Virus by PCR NEGATIVE NEGATIVE Final    Comment: (NOTE) Fact Sheet for Patients: BloggerCourse.com  Fact Sheet for Healthcare Providers:  SeriousBroker.it  This test is not yet approved or cleared by the [place] and has been authorized for detection and/or diagnosis of SARS-CoV-2 by FDA under an Emergency Use Authorization (EUA). This EUA will remain in effect (meaning this test can be used) for the duration of the COVID-19 declaration under Section 564(b)(1) of the Act, 21 U.S.C. section 360bbb-3(b)(1), unless the authorization is terminated or revoked.  Performed at [facility], [address] [place], [place] [address]   Culture, blood (routine x 2)     Status: Abnormal   Collection Time: 07/20/23  7:30 PM   Specimen: BLOOD  Result Value Ref Range Status   Specimen Description   Final    BLOOD SITE NOT SPECIFIED Performed at [facility], [address] [place], [place] [address]    Special Requests   Final    BOTTLES DRAWN AEROBIC AND ANAEROBIC Blood Culture results may not be optimal due to an inadequate volume of blood received in culture bottles Performed at [facility], [address] [place], [place] [address]    Culture  Setup Time   Final    GRAM NEGATIVE RODS AEROBIC BOTTLE  ONLY CRITICAL RESULT CALLED TO, READ BACK BY AND VERIFIED WITH: PHARMD MICHELLE BELL ON 07/21/23 @ 1722 BY DRT Performed at [facility], [address] [place], [place] [address]    Culture PSEUDOMONAS AERUGINOSA (A)  Final   Report Status 07/23/2023 FINAL  Final   Organism ID, Bacteria PSEUDOMONAS AERUGINOSA  Final      Susceptibility   Pseudomonas aeruginosa - MIC*    CEFTAZIDIME >=64 RESISTANT Resistant     CIPROFLOXACIN <=0.25 SENSITIVE Sensitive     GENTAMICIN 4 SENSITIVE Sensitive     IMIPENEM 1 SENSITIVE Sensitive     * PSEUDOMONAS AERUGINOSA  Blood Culture ID Panel (Reflexed)     Status: Abnormal   Collection Time: 07/20/23  7:30 PM  Result Value Ref Range Status   Enterococcus faecalis NOT DETECTED NOT DETECTED Final   Enterococcus Faecium NOT DETECTED NOT DETECTED Final   Listeria monocytogenes NOT DETECTED NOT DETECTED Final   Staphylococcus species NOT DETECTED NOT DETECTED Final   Staphylococcus aureus (BCID) NOT DETECTED NOT DETECTED Final   Staphylococcus epidermidis NOT DETECTED NOT DETECTED Final   Staphylococcus lugdunensis NOT DETECTED NOT DETECTED Final   Streptococcus species NOT DETECTED NOT DETECTED Final   Streptococcus agalactiae NOT DETECTED NOT DETECTED Final   Streptococcus pneumoniae NOT DETECTED NOT DETECTED Final   Streptococcus pyogenes NOT DETECTED NOT DETECTED Final   A.calcoaceticus-baumannii NOT DETECTED NOT DETECTED Final   Bacteroides fragilis NOT DETECTED NOT DETECTED Final   Enterobacterales NOT DETECTED NOT DETECTED Final   Enterobacter cloacae complex NOT DETECTED NOT DETECTED Final   Escherichia coli NOT DETECTED NOT DETECTED Final   Klebsiella aerogenes NOT DETECTED NOT DETECTED Final   Klebsiella oxytoca NOT DETECTED NOT DETECTED Final   Klebsiella pneumoniae NOT DETECTED NOT DETECTED Final   Proteus species NOT DETECTED NOT DETECTED Final   Salmonella species NOT DETECTED NOT DETECTED Final   Serratia marcescens NOT DETECTED  NOT DETECTED Final   Haemophilus influenzae NOT DETECTED NOT DETECTED Final   Neisseria meningitidis NOT DETECTED NOT DETECTED Final   Pseudomonas aeruginosa DETECTED (A) NOT DETECTED Final    Comment: CRITICAL RESULT CALLED TO, READ BACK BY AND VERIFIED WITH: PHARMD MICHELLE BELL ON 07/21/23 @ 1722 BY DRT    Stenotrophomonas maltophilia NOT DETECTED NOT DETECTED  Final   Candida albicans NOT DETECTED NOT DETECTED Final   Candida auris NOT DETECTED NOT DETECTED Final   Candida glabrata NOT DETECTED NOT DETECTED Final   Candida krusei NOT DETECTED NOT DETECTED Final   Candida parapsilosis NOT DETECTED NOT DETECTED Final   Candida tropicalis NOT DETECTED NOT DETECTED Final   Cryptococcus neoformans/gattii NOT DETECTED NOT DETECTED Final   CTX-M ESBL NOT DETECTED NOT DETECTED Final   Carbapenem resistance IMP NOT DETECTED NOT DETECTED Final   Carbapenem resistance KPC NOT DETECTED NOT DETECTED Final   Carbapenem resistance NDM NOT DETECTED NOT DETECTED Final   Carbapenem resistance VIM NOT DETECTED NOT DETECTED Final    Comment: Performed at [facility], [address] [place], [place] [address]  MRSA Next Gen by PCR, Nasal     Status: None   Collection Time: 07/21/23  8:59 AM   Specimen: Nasal Mucosa; Nasal Swab  Result Value Ref Range Status   MRSA by PCR Next Gen NOT DETECTED NOT DETECTED Final    Comment: (NOTE) The GeneXpert MRSA Assay (FDA approved for NASAL specimens only), is one component of a comprehensive MRSA colonization surveillance program. It is not intended to diagnose MRSA infection nor to guide or monitor treatment for MRSA infections. Test performance is not FDA approved in patients less than 57 years old. Performed at [facility], [address], [place], [place] [address]    Time coordinating discharge: 35 minutes  SIGNED: Lanae Boast, MD  [facility] 07/26/2023, 5:17 PM  If 7PM-7AM, please contact  night-coverage [email or web address]

## 2023-07-26 NOTE — TOC Transition Note (Addendum)
 Transition of Care [facility]) - Discharge Note  Patient Details  Name: Edward Waller MRN: [id number] Date of Birth: Jun 13, 1932  [facility] CM/SW Contact:  Ewing Schlein, LCSW Phone Number: 07/26/2023, 1:29 PM  Clinical Narrative: PT evaluation recommended HH and HH orders were place for PT/OT/aide on 07/24/23. CSW spoke with patient and spouse regarding HH. Spouse reported no preference for [facility] agency. CSW attempted to make a referral to Amy with [facility], but did not receive a response. CSW made [facility] referral to Cindie with [facility], which was accepted. Spouse updated. TOC signing off.  Addendum: Patient will need PTAR home. Medical necessity form done. Discharge packet completed. RN to call weekend PTAR once catheter is placed.  Final next level of care: Home w Home Health Services Barriers to Discharge: Barriers Resolved  Patient Goals and CMS Choice Patient states their goals for this hospitalization and ongoing recovery are:: For pt to go to rehab CMS Medicare.gov Compare Post Acute Care list provided to:: Patient Represenative (must comment) Choice offered to / list presented to : Patient, Spouse  Discharge Plan and Services Additional resources added to the After Visit Summary for   In-house Referral: Clinical Social Work Discharge Planning Services: NA Post Acute Care Choice: Skilled Nursing Facility          DME Arranged: N/A DME Agency: NA HH Arranged: PT, OT, Nurse's Aide HH Agency: [facility] Date [facility] Agency Contacted: 07/26/23 Time HH Agency Contacted: 1321 Representative spoke with at [facility] Agency: Cindie  Social Drivers of Health (SDOH) Interventions SDOH Screenings   Food Insecurity: No Food Insecurity (07/20/2023)  Housing: Low Risk  (07/20/2023)  Transportation Needs: No Transportation Needs (07/20/2023)  Utilities: Not At Risk (07/20/2023)  Depression (PHQ2-9): Low Risk  (05/16/2020)  Social Connections: Socially Integrated (07/20/2023)  Tobacco Use:  Low Risk  (07/21/2023)   Readmission Risk Interventions    07/22/2023    2:34 PM  Readmission Risk Prevention Plan  Transportation Screening Complete  PCP or Specialist Appt within 3-5 Days Complete  HRI or Home Care Consult Complete  Social Work Consult for Recovery Care Planning/Counseling Complete  Palliative Care Screening Not Applicable  Medication Review Oceanographer) Complete

## 2023-07-28 ENCOUNTER — Other Ambulatory Visit (HOSPITAL_COMMUNITY): Payer: Self-pay

## 2023-07-29 ENCOUNTER — Other Ambulatory Visit: Payer: Self-pay | Admitting: Urology

## 2023-07-30 ENCOUNTER — Telehealth: Payer: Self-pay | Admitting: Internal Medicine

## 2023-07-30 NOTE — Telephone Encounter (Signed)
 Dr. Jacques Navy,  You recently saw Mr. Negro in the hospital 07/24/23.  He was diagnosed with NSTEMI.  Invasive procedures were deferred.  This was also in the setting of sepsis due to right proximal ureteral stone.  Urology is now requesting preoperative cardiac evaluation for  Ureteroscopy tone Removal with Stent .  His PMH includes NSTEMI, metabolic encephalopathy, AKI, neurogenic bladder, paraplegia, DM, BPH, hypothyroidism.  May his aspirin be held prior to his procedure?  Would you be able to provide recommendations on cardiac risk for upcoming procedure?.  Thank you for your help.  Thomasene Ripple. Nakiyah Beverley NP-C     07/30/2023, 11:30 AM [facility] [address] Office [phone] Fax [phone]

## 2023-07-30 NOTE — Telephone Encounter (Signed)
     Primary Cardiologist: Parke Poisson, MD  Chart reviewed as part of pre-operative protocol coverage. Given past medical history and time since last visit, based on ACC/AHA guidelines, Edward Waller would be at acceptable risk for the planned procedure without further cardiovascular testing.    Due to his recent NSTEMI and past medical history he is considered high risk for surgery.  His aspirin may be held for 5-7 days prior to his procedure.  Please resume as soon as hemostasis is achieved.   I will route this recommendation to the requesting party via Epic fax function and remove from pre-op pool.  Please call with questions.  Thomasene Ripple. Tashe Purdon NP-C     07/30/2023, 1:04 PM [facility] [address] Office [phone] Fax [phone]

## 2023-07-30 NOTE — Telephone Encounter (Signed)
   Pre-operative Risk Assessment    Patient Name: Edward Waller  DOB: 11-14-1932 MRN: [id number]   Date of last office visit:  Date of next office visit:    Request for Surgical Clearance    Procedure:  Ureteroscopy tone Removal with Stent  Date of Surgery:  Clearance 08-22-23                                Surgeon:  Dr Jettie Pagan Surgeon's Group or Practice Name:    Phone number:  [phone] Fax number:  [phone]   Type of Clearance Requested:   - Medical  - Pharmacy:  Hold        Type of Anesthesia:  General    Additional requests/questions:    Signed, Laurence Ferrari   07/30/2023, 11:10 AM

## 2023-08-13 ENCOUNTER — Encounter (HOSPITAL_COMMUNITY): Payer: Self-pay | Admitting: Urology

## 2023-08-13 NOTE — Progress Notes (Signed)
 Spoke w/ via phone for pre-op interview--- Edward Waller and [name]  Lab needs dos---- BMP and CBG per anesthesia        Lab results------Current EKG in Epic dated 07/23/23. Current A1C-6.5 dated 07/21/23 COVID test -----patient states asymptomatic no test needed Arrive at -------1130 NPO after MN NO Solid Food.  Clear liquids from MN until---1030 Pre-Surgery Ensure or G2:  Med rec completed Medications to take morning of surgery -----Levothyroxine  and Metoprolol  Diabetic medication -----NONE AM of surgery  GLP1 agonist last dose: GLP1 instructions:  Patient instructed no nail polish to be worn day of surgery Patient instructed to bring photo id and insurance card day of surgery Patient aware to have Driver (ride ) / caregiver    for 24 hours after surgery - [name]. Patient will be transported by the [place]. Patient Special Instructions ----- shower with antibacterial soap. Pre-Op special Instructions -----  Patient verbalized understanding of instructions that were given at this phone interview. Patient denies chest pain, sob, fever, cough at the interview.   Anesthesia Review: pt is paraplegic, per wife he is paralyzed from the chest down. Will be transported by [place], wife to meet at hospital and be there throughout stay. Pt is A&O X4. Able to sign own consent. Spoke with Charlanne Cong, RN Nurse supervisor for [facility] stated he would be okay to do pt with us  since we were at the main [facility].  [facility] Dr Chancy Comber. Cardiac clearance in Epic by Lawana Pray, NP on 07/30/23.  PPM/ ICD: Device Orders: Rep Notified:  Chest x-ray :07/20/23 EKG :07/23/23 Echo :07/21/23- EF 45-50% Stress test: Cardiac Cath :   Activity level:  Sleep Study/ CPAP :Has sleep apnea does not wear CPAP Fasting Blood Sugar :      / Checks Blood Sugar -- times a day:    Blood Thinner/ Instructions /Last Dose: ASA / Instructions/ Last Dose : Hold ASA 5-7 days prior to procedure, pt  wife verbalized understanding.

## 2023-08-18 ENCOUNTER — Encounter (HOSPITAL_COMMUNITY): Payer: Self-pay | Admitting: Urology

## 2023-08-18 ENCOUNTER — Other Ambulatory Visit (HOSPITAL_COMMUNITY): Payer: Self-pay | Admitting: Urology

## 2023-08-18 ENCOUNTER — Other Ambulatory Visit (HOSPITAL_COMMUNITY): Payer: Self-pay

## 2023-08-18 DIAGNOSIS — N312 Flaccid neuropathic bladder, not elsewhere classified: Secondary | ICD-10-CM

## 2023-08-18 MED ORDER — FLUCONAZOLE 200 MG PO TABS
200.0000 mg | ORAL_TABLET | Freq: Every day | ORAL | 0 refills | Status: DC
  Filled 2023-08-18: qty 7, 7d supply, fill #0

## 2023-08-22 ENCOUNTER — Ambulatory Visit (HOSPITAL_BASED_OUTPATIENT_CLINIC_OR_DEPARTMENT_OTHER): Admitting: Anesthesiology

## 2023-08-22 ENCOUNTER — Encounter (HOSPITAL_COMMUNITY)
Admission: RE | Disposition: A | Discharge status: Home / Self Care | Payer: Self-pay | Source: Home / Self Care | Attending: Urology

## 2023-08-22 ENCOUNTER — Ambulatory Visit (HOSPITAL_COMMUNITY): Admitting: Anesthesiology

## 2023-08-22 ENCOUNTER — Ambulatory Visit (HOSPITAL_COMMUNITY)

## 2023-08-22 ENCOUNTER — Ambulatory Visit (HOSPITAL_COMMUNITY)
Admission: RE | Admit: 2023-08-22 | Discharge: 2023-08-22 | Disposition: A | Discharge status: Home / Self Care | Attending: Urology | Admitting: Urology

## 2023-08-22 ENCOUNTER — Encounter (HOSPITAL_COMMUNITY): Payer: Self-pay | Admitting: Urology

## 2023-08-22 DIAGNOSIS — G8220 Paraplegia, unspecified: Secondary | ICD-10-CM | POA: Insufficient documentation

## 2023-08-22 DIAGNOSIS — M1990 Unspecified osteoarthritis, unspecified site: Secondary | ICD-10-CM | POA: Insufficient documentation

## 2023-08-22 DIAGNOSIS — R292 Abnormal reflex: Secondary | ICD-10-CM | POA: Insufficient documentation

## 2023-08-22 DIAGNOSIS — E119 Type 2 diabetes mellitus without complications: Secondary | ICD-10-CM

## 2023-08-22 DIAGNOSIS — I10 Essential (primary) hypertension: Secondary | ICD-10-CM | POA: Diagnosis not present

## 2023-08-22 DIAGNOSIS — N201 Calculus of ureter: Secondary | ICD-10-CM

## 2023-08-22 DIAGNOSIS — T148XXS Other injury of unspecified body region, sequela: Secondary | ICD-10-CM | POA: Insufficient documentation

## 2023-08-22 DIAGNOSIS — G4730 Sleep apnea, unspecified: Secondary | ICD-10-CM | POA: Insufficient documentation

## 2023-08-22 DIAGNOSIS — R030 Elevated blood-pressure reading, without diagnosis of hypertension: Secondary | ICD-10-CM

## 2023-08-22 HISTORY — DX: Essential (primary) hypertension: I10

## 2023-08-22 HISTORY — PX: CYSTOSCOPY/URETEROSCOPY/HOLMIUM LASER/STENT PLACEMENT: SHX6546

## 2023-08-22 LAB — BASIC METABOLIC PANEL WITH GFR
Anion gap: 6 (ref 5–15)
BUN: 13 mg/dL (ref 8–23)
CO2: 28 mmol/L (ref 22–32)
Calcium: 9.7 mg/dL (ref 8.9–10.3)
Chloride: 105 mmol/L (ref 98–111)
Creatinine, Ser: 0.56 mg/dL — ABNORMAL LOW (ref 0.61–1.24)
GFR, Estimated: >60 mL/min (ref >60)
Glucose, Bld: 130 mg/dL — ABNORMAL HIGH (ref 70–99)
Potassium: 4.9 mmol/L (ref 3.5–5.1)
Sodium: 139 mmol/L (ref 135–145)

## 2023-08-22 SURGERY — CYSTOSCOPY/URETEROSCOPY/HOLMIUM LASER/STENT PLACEMENT
Anesthesia: General | Site: Ureter | Laterality: Right

## 2023-08-22 MED ORDER — ONDANSETRON HCL 4 MG/2ML IJ SOLN
INTRAMUSCULAR | Status: DC | PRN
  Administered 2023-08-22: 4 mg via INTRAVENOUS

## 2023-08-22 MED ORDER — SODIUM CHLORIDE 0.9 % IR SOLN
Status: DC | PRN
  Administered 2023-08-22: 3000 mL

## 2023-08-22 MED ORDER — CHLORHEXIDINE GLUCONATE 0.12 % MT SOLN
OROMUCOSAL | Status: AC
  Filled 2023-08-22: qty 15

## 2023-08-22 MED ORDER — LACTATED RINGERS IV SOLN
INTRAVENOUS | Status: DC
Start: 2023-08-22 — End: 2023-08-22

## 2023-08-22 MED ORDER — OXYCODONE-ACETAMINOPHEN 5-325 MG PO TABS: 1.0000 | ORAL_TABLET | ORAL | 0 refills | Status: DC | PRN

## 2023-08-22 MED ORDER — FENTANYL CITRATE (PF) 250 MCG/5ML IJ SOLN
INTRAMUSCULAR | Status: AC
  Filled 2023-08-22: qty 5

## 2023-08-22 MED ORDER — DEXAMETHASONE SODIUM PHOSPHATE 10 MG/ML IJ SOLN
INTRAMUSCULAR | Status: DC | PRN
  Administered 2023-08-22: 10 mg via INTRAVENOUS

## 2023-08-22 MED ORDER — IOHEXOL 300 MG/ML  SOLN
INTRAMUSCULAR | Status: DC | PRN
Start: 2023-08-22 — End: 2023-08-22
  Administered 2023-08-22: 4 mL via URETHRAL

## 2023-08-22 MED ORDER — DEXAMETHASONE SODIUM PHOSPHATE 10 MG/ML IJ SOLN
INTRAMUSCULAR | Status: AC
  Filled 2023-08-22: qty 1

## 2023-08-22 MED ORDER — CEPHALEXIN 500 MG PO CAPS: 500.0000 mg | ORAL_CAPSULE | Freq: Two times a day (BID) | ORAL | 0 refills | Status: DC

## 2023-08-22 MED ORDER — MIDAZOLAM HCL 2 MG/2ML IJ SOLN
INTRAMUSCULAR | Status: AC
  Filled 2023-08-22: qty 2

## 2023-08-22 MED ORDER — LIDOCAINE 2% (20 MG/ML) 5 ML SYRINGE
INTRAMUSCULAR | Status: AC
  Filled 2023-08-22: qty 5

## 2023-08-22 MED ORDER — PROPOFOL 10 MG/ML IV BOLUS
INTRAVENOUS | Status: AC
  Filled 2023-08-22: qty 20

## 2023-08-22 MED ORDER — CEFAZOLIN SODIUM-DEXTROSE 2-4 GM/100ML-% IV SOLN
INTRAVENOUS | Status: AC
  Filled 2023-08-22: qty 100

## 2023-08-22 MED ORDER — PHENYLEPHRINE 80 MCG/ML (10ML) SYRINGE FOR IV PUSH (FOR BLOOD PRESSURE SUPPORT)
PREFILLED_SYRINGE | INTRAVENOUS | Status: DC | PRN
  Administered 2023-08-22 (×5): 80 ug via INTRAVENOUS

## 2023-08-22 MED ORDER — ORAL CARE MOUTH RINSE: 15.0000 mL | Freq: Once | OROMUCOSAL | Status: AC

## 2023-08-22 MED ORDER — CHLORHEXIDINE GLUCONATE 0.12 % MT SOLN
15.0000 mL | Freq: Once | OROMUCOSAL | Status: AC
  Administered 2023-08-22: 15 mL via OROMUCOSAL

## 2023-08-22 MED ORDER — PROPOFOL 10 MG/ML IV BOLUS
INTRAVENOUS | Status: DC | PRN
  Administered 2023-08-22: 20 mg via INTRAVENOUS
  Administered 2023-08-22: 90 mg via INTRAVENOUS

## 2023-08-22 MED ORDER — MIDAZOLAM HCL 2 MG/2ML IJ SOLN
INTRAMUSCULAR | Status: DC | PRN
  Administered 2023-08-22: 1 mg via INTRAVENOUS

## 2023-08-22 MED ORDER — ONDANSETRON HCL 4 MG/2ML IJ SOLN
INTRAMUSCULAR | Status: AC
  Filled 2023-08-22: qty 2

## 2023-08-22 MED ORDER — FENTANYL CITRATE (PF) 100 MCG/2ML IJ SOLN: 25.0000 ug | INTRAMUSCULAR | Status: DC | PRN

## 2023-08-22 MED ORDER — CEFAZOLIN SODIUM-DEXTROSE 2-4 GM/100ML-% IV SOLN
2.0000 g | INTRAVENOUS | Status: AC
  Administered 2023-08-22: 2 g via INTRAVENOUS

## 2023-08-22 MED ORDER — DOCUSATE SODIUM 100 MG PO CAPS: 100.0000 mg | ORAL_CAPSULE | Freq: Every day | ORAL | 0 refills | Status: AC | PRN

## 2023-08-22 SURGICAL SUPPLY — 25 items
BAG URO CATCHER STRL LF (MISCELLANEOUS) ×1 IMPLANT
BASKET ZERO TIP NITINOL 2.4FR (BASKET) IMPLANT
CATH SET URETHRAL DILATOR (CATHETERS) IMPLANT
CATH URETL OPEN 5X70 (CATHETERS) ×1 IMPLANT
CLOTH BEACON ORANGE TIMEOUT ST (SAFETY) ×1 IMPLANT
FIBER LASER TRACTIP 200 (UROLOGICAL SUPPLIES) IMPLANT
GLOVE BIO SURGEON STRL SZ7 (GLOVE) ×1 IMPLANT
GOWN STRL REUS W/ TWL LRG LVL3 (GOWN DISPOSABLE) ×1 IMPLANT
GUIDEWIRE STR DUAL SENSOR (WIRE) ×1 IMPLANT
GUIDEWIRE ZIPWRE .038 STRAIGHT (WIRE) IMPLANT
IV NS 1000ML BAXH (IV SOLUTION) ×1 IMPLANT
KIT TURNOVER KIT B (KITS) ×1 IMPLANT
LASER FIB FLEXIVA PULSE ID 365 (Laser) IMPLANT
MANIFOLD NEPTUNE II (INSTRUMENTS) ×1 IMPLANT
NS IRRIG 500ML POUR BTL (IV SOLUTION) ×1 IMPLANT
PACK CYSTO (CUSTOM PROCEDURE TRAY) ×1 IMPLANT
SHEATH NAVIGATOR HD 11/13X28 (SHEATH) IMPLANT
SHEATH NAVIGATOR HD 11/13X36 (SHEATH) IMPLANT
SHEATH NAVIGATOR HD 12/14X46 (SHEATH) IMPLANT
SLEEVE SCD COMPRESS KNEE MED (STOCKING) ×1 IMPLANT
SOL .9 NS 3000ML IRR UROMATIC (IV SOLUTION) ×1 IMPLANT
STENT URET 6FRX26 CONTOUR (STENTS) IMPLANT
SYR 10ML LL (SYRINGE) ×1 IMPLANT
TUBE CONNECTING 12X1/4 (SUCTIONS) ×1 IMPLANT
TUBING UROLOGY SET (TUBING) ×1 IMPLANT

## 2023-08-22 NOTE — Anesthesia Procedure Notes (Signed)
 Procedure Name: LMA Insertion Date/Time: 08/22/2023 1:02 PM  Performed by: Luann Aspinwall C, CRNAPre-anesthesia Checklist: Patient identified, Emergency Drugs available, Suction available and Patient being monitored Patient Re-evaluated:Patient Re-evaluated prior to induction Oxygen Delivery Method: Circle System Utilized Preoxygenation: Pre-oxygenation with 100% oxygen Induction Type: IV induction Ventilation: Mask ventilation without difficulty LMA: LMA inserted LMA Size: 4.0 Number of attempts: 1 Airway Equipment and Method: Bite block Placement Confirmation: positive ETCO2 Tube secured with: Tape Dental Injury: Teeth and Oropharynx as per pre-operative assessment

## 2023-08-22 NOTE — Progress Notes (Signed)
 PACU Nursing Note Wife brought to bedside, DC instructions were reviewed with wife. Copy of AVS also provided to wife. Urinary Drainage Device teaching also performed and provided to wife. [facility] phoned to request transport back to private residence. VSS, alert and oriented, denies any pain, urinary drainage indicates yellow/ straw color, clear.

## 2023-08-22 NOTE — H&P (Signed)
 Office Visit Report     08/14/2023   --------------------------------------------------------------------------------   Edward Waller  MRN: [id number]  DOB: 1932-08-19, 89 year old Male  SSN:    PRIMARY CARE:     REFERRING:    PROVIDER:  Doy Gene, M.D.  LOCATION:  [facility] - [phone]     --------------------------------------------------------------------------------   CC/HPI: Edward Waller is an 89 year old male who is seen in consultation today for urolithiasis.   1. Urolithiasis:  - He presented to ED on 07/21/2023 with complaints of confusion, abdominal pain. Prior to that, he was treated for UTI. He is found to have purulent urine when he performs intermittent catheterization. CT A/P 07/20/2023 revealed 8 mm proximal right ureteral stone with moderate hydroureteronephrosis. There was also urothelial thickening and hyperenhancement with periureteral stranding around the right renal pelvis and ureter. He underwent right ureteral stent placement by Dr. Claretta Croft on 07/21/2023.  Patient notes that he has been tolerating the stent well. He denies fevers, chills, dysuria. He was treated with appropriate course of antibiotics.   #2. Neurogenic bladder: He has a long history of neurogenic bladder was previously undergone CIC every 4 hours. He currently has Foley catheter in place draining clear yellow urine.     ALLERGIES: None   MEDICATIONS: Levothyroxine  Sodium 50 MCG Tablet  Baclofen  10 MG Tablet  Mirabegron  ER 50 MG Tablet Extended Release 24 Hour  Solifenacin Succinate 5 MG Tablet     GU PSH: No GU PSH    NON-GU PSH: No Non-GU PSH    GU PMH: No GU PMH    NON-GU PMH: No Non-GU PMH    FAMILY HISTORY: No Family History    SOCIAL HISTORY: Marital Status: Married Current Smoking Status: Patient has never smoked.   Tobacco Use Assessment Completed: Used Tobacco in last 30 days? Has never drank.  Does not drink caffeine.    REVIEW OF SYSTEMS:     GU Review Male:   Patient denies frequent urination, hard to postpone urination, burning/ pain with urination, get up at night to urinate, leakage of urine, stream starts and stops, trouble starting your stream, have to strain to urinate , erection problems, and penile pain.  Gastrointestinal (Upper):   Patient denies nausea, vomiting, and indigestion/ heartburn.  Gastrointestinal (Lower):   Patient denies diarrhea and constipation.  Constitutional:   Patient denies fever, night sweats, weight loss, and fatigue.  Skin:   Patient denies skin rash/ lesion and itching.  Eyes:   Patient denies blurred vision and double vision.  Ears/ Nose/ Throat:   Patient denies sore throat and sinus problems.  Hematologic/Lymphatic:   Patient denies swollen glands and easy bruising.  Cardiovascular:   Patient denies leg swelling and chest pains.  Respiratory:   Patient denies cough and shortness of breath.  Endocrine:   Patient denies excessive thirst.  Musculoskeletal:   Patient denies back pain and joint pain.  Neurological:   Patient denies headaches and dizziness.  Psychologic:   Patient denies depression and anxiety.   VITAL SIGNS:      08/14/2023 11:57 AM  Weight 160 lb / 72.57 kg  Height 72 in / 182.88 cm  BP 165/49 mmHg  Pulse 71 /min  Temperature 97.9 F / 36.6 C  BMI 21.7 kg/m   MULTI-SYSTEM PHYSICAL EXAMINATION:    Constitutional: Well-nourished. No physical deformities. Normally developed. Good grooming.  Respiratory: No labored breathing, no use of accessory muscles.   Cardiovascular: Normal temperature, normal extremity pulses, no swelling, no  varicosities.  Gastrointestinal: No mass, no tenderness, no rigidity, non obese abdomen.     Complexity of Data:  Source Of History:  Patient, Medical Record Summary  Records Review:   Previous Doctor Records, Previous Patient Records  Urine Test Review:   Urinalysis  X-Ray Review: C.T. Abdomen/Pelvis: Reviewed Films. Reviewed Report. Discussed  With Patient.     PROCEDURES:          Visit Complexity - G2211          Urinalysis w/Scope Dipstick Dipstick Cont'd Micro  Color: Yellow Bilirubin: Neg mg/dL WBC/hpf: 6 - 16/XWR  Appearance: Clear Ketones: Neg mg/dL RBC/hpf: NS (Not Seen)  Specific Gravity: 1.020 Blood: Neg ery/uL Bacteria: NS (Not Seen)  pH: <=5.0 Protein: Neg mg/dL Cystals: NS (Not Seen)  Glucose: Neg mg/dL Urobilinogen: 0.2 mg/dL Casts: NS (Not Seen)    Nitrites: Neg Trichomonas: Not Present    Leukocyte Esterase: 1+ leu/uL Mucous: Not Present      Epithelial Cells: NS (Not Seen)      Yeast: NS (Not Seen)      Sperm: Not Present    Notes: microscopic exam completed on unspun sample due to QNS    ASSESSMENT:      ICD-10 Details  1 GU:   Ureteral calculus - N20.1   2   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 Contrast - SPT placement          Schedule         Document Letter(s):  Created for Patient: Clinical Summary         Notes:    1. Urolithiasis:  - He is scheduled for cystoscopy with right ureteroscopy and laser lithotripsy of right ureteral stone on 08/22/2023.  - Will obtain urine culture today and notify result.  - Discussed stone risk and prevention as below.   We discussed the options for management of kidney stones, including observation, ESWL, ureteroscopy with laser lithotripsy, and PCNL. The risks and benefits of each option were discussed.  For observation I described the risks which include but are not limited to silent renal damage, life-threatening infection, need for emergent surgery, failure to pass stone, and pain.   ESWL: risks and benefits of ESWL were outlined including infection, bleeding, pain, steinstrasse, kidney injury, need for ancillary treatments, and global anesthesia risks including but not limited to CVA, MI, DVT, PE, pneumonia, and death.   Ureteroscopy: risks and benefits of  ureteroscopy were outlined, including infection, bleeding, pain, temporary ureteral stent and associated stent bother, ureteral injury, ureteral stricture, need for ancillary treatments, and global anesthesia risks including but not limited to CVA, MI, DVT, PE, pneumonia, and death.   We discussed dietary methods for stone prevention including the following: increased water  intake to 2-3 liters per day, add lemon or lemon concentrate to water  to increase citrate which is beneficial for stone prevention, limiting dietary sodium to less than 2000 mg per day, limiting animal protein to less than 2 servings (16 ounces/day), and limiting foods high in oxalate content (spinach, beans, chocolate, etc.).   #2. Neurogenic bladder: He has a long history of neurogenic bladder has previously been managed with CIC via his wife for the past several months. They would like to transition to suprapubic tube placement. Will place referral for interventional radiology to place suprapubic tube.  Next Appointment:      Next Appointment: 08/22/2023 01:30 PM    Appointment Type: Surgery     Location: [facility] [phone]    Provider: Doy Gene, M.D.    Reason for Visit: OP--CONE--CY, RIGHT URS, HLL, STENT, RPG    Urology Preoperative H&P   Chief Complaint: Right ureteral stone  History of Present Illness: Edward Waller is a 89 y.o. male with a right ureteral stone here for cystoscopy, right RPG, R URS/LL, R stent placement. Denies fevers, chills, dysuria. States he took preoperative antibiotics and antifungals.    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   Cataract    bilateral   Fx ankle    left; Dec 2021   Hip pain, chronic, right    History of kidney stones    Hypertension    Hypothyroidism    Liver cyst    Low back pain    OA (osteoarthritis) of hip    Prostate hyperplasia without  urinary obstruction    Renal cyst, left    Restless leg syndrome    Sleep apnea    Spinal stenosis    Thyroid disease     Past Surgical History:  Procedure Laterality Date   BACK SURGERY     CYSTOSCOPY W/ URETERAL STENT PLACEMENT Right 07/21/2023   Procedure: CYSTOSCOPY, WITH RETROGRADE PYELOGRAM AND URETERAL STENT INSERTION;  Surgeon: Marco Severs, MD;  Location: [facility];  Service: Urology;  Laterality: Right;   EYE SURGERY     bilateral cataract removal with Lens   LAMINECTOMY WITH POSTERIOR LATERAL ARTHRODESIS LEVEL 3 N/A 01/03/2023   Procedure: OPEN REDUCTION INTERNAL FIXATION THORACIC SEVEN -THORACIC NINE;  Surgeon: Audie Bleacher, MD;  Location: [facility];  Service: Neurosurgery;  Laterality: N/A;   TIBIA IM NAIL INSERTION Right 07/06/2021   Procedure: INTRAMEDULLARY (IM) NAIL TIBIAL;  Surgeon: Hardy Lia, MD;  Location: [facility];  Service: Orthopedics;  Laterality: Right;   TOTAL HIP ARTHROPLASTY Right 03/20/2022   Procedure: TOTAL HIP ARTHROPLASTY ANTERIOR APPROACH;  Surgeon: Liliane Rei, MD;  Location: [facility];  Service: Orthopedics;  Laterality: Right;   TOTAL HIP ARTHROPLASTY Right 2023    Allergies:  Allergies  Allergen Reactions   Trazodone  And Nefazodone     nightmares    History reviewed. No pertinent family history.  Social History:  reports that he has never smoked. He has never used smokeless tobacco. He reports that he does not drink alcohol  and does not use drugs.  ROS: A complete review of systems was performed.  All systems are negative except for pertinent findings as noted.  Physical Exam:  Vital signs in last 24 hours:   Constitutional:  Alert and oriented, No acute distress Cardiovascular: Regular rate and rhythm Respiratory: Normal respiratory effort, Lungs clear bilaterally GI: Abdomen is soft, nontender, nondistended, no abdominal masses GU: No CVA tenderness Lymphatic: No lymphadenopathy Neurologic: Grossly intact, no focal  deficits Psychiatric: Normal mood and affect  Laboratory Data:  No results for input(s): "WBC", "HGB", "HCT", "PLT" in the last 72 hours.  No results for input(s): "NA", "K", "CL", "GLUCOSE", "BUN", "CALCIUM ", "CREATININE" in the last 72 hours.  Invalid input(s): "CO3"   No results found for this or any previous visit (from the past 24 hours). No results found for this or any previous visit (from the past 240 hours).  Renal  Function: No results for input(s): "CREATININE" in the last 168 hours. CrCl cannot be calculated (Patient's most recent lab result is older than the maximum 21 days allowed.).  Radiologic Imaging: No results found.  I independently reviewed the above imaging studies.  Assessment and Plan Edward Waller is a 89 y.o. male with a right ureteral stone here for cystoscopy, right RPG, R URS/LL, R stent placement.   -The risks, benefits and alternatives of cystoscopy with right URS/LL, right JJ stent placement was discussed with the patient.  Risks include, but are not limited to: bleeding, urinary tract infection, ureteral injury, ureteral stricture disease, chronic pain, urinary symptoms, bladder injury, stent migration, the need for nephrostomy tube placement, MI, CVA, DVT, PE and the inherent risks with general anesthesia.  The patient voices understanding and wishes to proceed.     Edward R. Purvi Ruehl MD 08/22/2023, 11:53 AM  [facility] Pager: [phone]): [phone]

## 2023-08-22 NOTE — Anesthesia Preprocedure Evaluation (Signed)
 Anesthesia Evaluation  Patient identified by MRN, date of birth, ID band Patient confused    Reviewed: Allergy & Precautions, H&P , NPO status , Patient's Chart, lab work & pertinent test results  History of Anesthesia Complications Negative for: history of anesthetic complications  Airway Mallampati: II  TM Distance: >3 FB Neck ROM: full    Dental  (+) Teeth Intact, Dental Advisory Given,    Pulmonary sleep apnea    breath sounds clear to auscultation       Cardiovascular hypertension, Pt. on medications and Pt. on home beta blockers (-) angina (-) Past MI and (-) CHF  Rhythm:regular     Neuro/Psych Spinal cord injury with paraplegia   Neuromuscular disease    GI/Hepatic negative GI ROS, Neg liver ROS,,,  Endo/Other  diabetesHypothyroidism    Renal/GU Renal diseaseStones     Musculoskeletal  (+) Arthritis ,    Abdominal   Peds  Hematology negative hematology ROS (+) Lab Results      Component                Value               Date                      WBC                      9.2                 07/26/2023                HGB                      14.2                07/26/2023                HCT                      43.3                07/26/2023                MCV                      98.6                07/26/2023                PLT                      203                 07/26/2023              Anesthesia Other Findings   Reproductive/Obstetrics                              Anesthesia Physical Anesthesia Plan  ASA: 3  Anesthesia Plan: General   Post-op Pain Management:    Induction: Intravenous  PONV Risk Score and Plan: 2 and Ondansetron , Dexamethasone  and Treatment may vary due to age or medical condition  Airway Management Planned: LMA  Additional Equipment: None  Intra-op Plan:   Post-operative Plan: Extubation in OR  Informed Consent: I have reviewed the  patients History  and Physical, chart, labs and discussed the procedure including the risks, benefits and alternatives for the proposed anesthesia with the patient or authorized representative who has indicated his/her understanding and acceptance.     Dental advisory given  Plan Discussed with: CRNA, Anesthesiologist and Surgeon  Anesthesia Plan Comments:         Anesthesia Quick Evaluation

## 2023-08-22 NOTE — Op Note (Signed)
 Operative Note  Preoperative diagnosis:  1.  Right ureteral stone  Postoperative diagnosis: 1.  Right ureteral stone  Procedure(s): 1.  Cystoscopy 2. Right ureteroscopy with laser lithotripsy and basket extraction of stones 3. Right retrograde pyelogram 4. Right ureteral stent placement 5. Fluoroscopy with intraoperative interpretation  Surgeon: Doy Gene, MD  Assistants:  None  Anesthesia:  General  Complications:  None  EBL:  Minimal  Specimens: 1. Stones for stone analysis (to be done at [facility])  Drains/Catheters: 1.  Right 6Fr x 26cm ureteral stent with tether string tied to 16 French foley catheter 2. 16 french catheter (chronic catheter - undergoes CIC at home)  Intraoperative findings:   Cystoscopy demonstrated no suspicious bladder lesions. Right retrograde demonstrated no hydronephrosis. Right ureteroscopy demonstrated an 8mm right renal stone, fragmented and basket extracted. Successful stent placement.  Indication:  Edward Waller is a 89 y.o. male with a right ureteral stone here for ureteroscopy with laser lithotripsy.  Description of procedure: After informed consent was obtained from the patient, the patient was identified and taken to the operating room and placed in the supine position.  General anesthesia was administered as well as perioperative IV antibiotics.  At the beginning of the case, a time-out was performed to properly identify the patient, the surgery to be performed, and the surgical site.  Sequential compression devices were applied to the lower extremities at the beginning of the case for DVT prophylaxis.  The patient was then placed in the dorsal lithotomy supine position, prepped and draped in sterile fashion.  Preliminary scout fluoroscopy revealed that there was a 8mm calcification area at the right renal pelvis, which corresponds to the stone found on the preoperative CT scan. We then passed the 21-French rigid cystoscope  through the urethra and into the bladder under vision without any difficulty, noting a normal urethra without strictures and a mildly obstructing prostate.  A systematic evaluation of the bladder revealed no evidence of any suspicious bladder lesions.  Ureteral orifices were in normal position.    The distal aspect of the ureteral stent was seen protruding from the right ureteral orifice.  We then used the alligator-tooth forceps and grasped the distal end of the ureteral stent and brought it out the urethral meatus while watching the proximal coil straighten out nicely on fluoroscopy. Through the ureteral stent, we then passed a 0.038 sensor wire up to the level of the renal pelvis.  The ureteral stent was then removed, leaving the sensor wire up the right ureter.    Under cystoscopic and flouroscopic guidance, we cannulated the right ureteral orifice with a 5-French open-ended ureteral catheter and a gentle retrograde pyelogram was performed, revealing a normal caliber ureter without any filling defects. There was no hydronephrosis of the collecting system. There was an 8mm filling defect in the renal pelvis corresponding to the stone. A 0.038 sensor wire was then passed up to the level of the renal pelvis and secured to the drape as a safety wire. The ureteral catheter and cystoscope were removed, leaving the safety wire in place.   A semi-rigid ureteroscope was passed alongside the wire up the distal ureter which appeared normal. A second 0.038 sensor wire was passed under direct vision and the semirigid scope was removed. The flexible ureteroscope was advanced into the collecting system. The collecting system was inspected. The calculus was identified at the right upper pole. Using the 200 micron holmium laser fiber, the stone was dusted completely. A 2.2 Fr zero  tip basket was used to remove the small fragments under visual guidance. These were sent for chemical analysis. With the ureteroscope in the  kidney, a gentle pyelogram was performed to delineate the calyceal system and we evaluated the calyces systematically. We encountered no further large stones. The rest of the stone fragments were very tiny and these were  irrigated away gently. The calyces were re-inspected and there were no significant stone fragment residual.   We then withdrew the ureteroscope back down the ureter, noting no evidence of any stones along the course of the ureter.  Prior to removing the ureteroscope, we did pass the Glidewire back up to the ureter to the renal pelvis.    Once the ureteroscope was removed, the Glidewire was backloaded through the rigid cystoscope, which was then advanced down the urethra and into the bladder. We then used the Glidewire under direct vision through the rigid cystoscope and under fluoroscopic guidance and passed up a 6-French, 26 cm double-pigtail ureteral stent up ureter, making sure that the proximal and distal ends coiled within the kidney and bladder respectively.  Note that we left a long tether string attached to the distal end of the ureteral stent and it exited the urethral meatus and was tied to the foley catheter. The bladder was then emptied with irrigation solution.  The patient tolerated the procedure well and there was no complication. Patient was awoken from anesthesia and taken to the recovery room in stable condition. I was present and scrubbed for the entirety of the case.  Plan:  Patient will be discharged home.  Patient's wife is a retired Engineer, civil (consulting) and will remove foley catheter and stent on Monday and resume CIC.  Edward R. Jared Cahn MD [facility]  Pager: [phone]

## 2023-08-22 NOTE — Discharge Instructions (Addendum)
 [facility] [phone] Post Ureteroscopy With or Without Stent Instructions  Definitions:  Ureter: The duct that transports urine from the kidney to the bladder. Stent:   A plastic hollow tube that is placed into the ureter, from the kidney to the bladder to prevent the ureter from swelling shut.  GENERAL INSTRUCTIONS:  Despite the fact that no skin incisions were used, the area around the ureter and bladder is raw and irritated. The stent is a foreign body which will further irritate the bladder wall. This irritation is manifested by increased frequency of urination, both day and night, and by an increase in the urge to urinate. In some, the urge to urinate is present almost always. Sometimes the urge is strong enough that you may not be able to stop yourself from urinating. The only real cure is to remove the stent and then give time for the bladder wall to heal which can't be done until the danger of the ureter swelling shut has passed, which varies.  You may see some blood in your urine while the stent is in place and a few days afterwards. Do not be alarmed, even if the urine was clear for a while. Get off your feet and drink lots of fluids until clearing occurs. If you start to pass clots or don't improve, call us .  DIET: You may return to your normal diet immediately. Because of the raw surface of your bladder, alcohol , spicy foods, acid type foods and drinks with caffeine may cause irritation or frequency and should be used in moderation. To keep your urine flowing freely and to avoid constipation, drink plenty of fluids during the day ( 8-10 glasses ). Tip: Avoid cranberry juice because it is very acidic.  ACTIVITY: Your physical activity doesn't need to be restricted. However, if you are very active, you may see some blood in your urine. We suggest that you reduce your activity under these circumstances until the bleeding has stopped.  BOWELS: It is important to  keep your bowels regular during the postoperative period. Straining with bowel movements can cause bleeding. A bowel movement every other day is reasonable. Use a mild laxative if needed, such as Milk of Magnesia 2-3 tablespoons, or 2 Dulcolax tablets. Call if you continue to have problems. If you have been taking narcotics for pain, before, during or after your surgery, you may be constipated. Take a laxative if necessary.   MEDICATION: You should resume your pre-surgery medications unless told not to. In addition you will often be given an antibiotic to prevent infection. These should be taken as prescribed until the bottles are finished unless you are having an unusual reaction to one of the drugs.  PROBLEMS YOU SHOULD REPORT TO US : Fevers over 100.5 Fahrenheit. Heavy bleeding, or clots ( See above notes about blood in urine ). Inability to urinate. Drug reactions ( hives, rash, nausea, vomiting, diarrhea ). Severe burning or pain with urination that is not improving.  FOLLOW-UP: You will need a follow-up appointment to monitor your progress. Call for this appointment at the number listed above. Usually the first appointment will be about three to fourteen days after your surgery.  Please remove foley catheter (and stent which is tied to catheter) on Monday with provided 10ml syringe. Post Anesthesia Home Care Instructions  Activity: Get plenty of rest for the remainder of the day. A responsible individual must stay with you for 24 hours following the procedure.  For the next 24 hours, DO NOT: -Drive  a car -Advertising copywriter -Drink alcoholic beverages -Take any medication unless instructed by your physician -Make any legal decisions or sign important papers.  Meals: Start with liquid foods such as gelatin or soup. Progress to regular foods as tolerated. Avoid greasy, spicy, heavy foods. If nausea and/or vomiting occur, drink only clear liquids until the nausea and/or vomiting  subsides. Call your physician if vomiting continues.  Special Instructions/Symptoms: Your throat may feel dry or sore from the anesthesia or the breathing tube placed in your throat during surgery. If this causes discomfort, gargle with warm salt water . The discomfort should disappear within 24 hours.

## 2023-08-22 NOTE — Transfer of Care (Signed)
 Immediate Anesthesia Transfer of Care Note  Patient: Edward Waller  Procedure(s) Performed: CYSTOSCOPY/URETEROSCOPY/HOLMIUM LASER/STENT PLACEMENT (Right: Ureter)  Patient Location: PACU  Anesthesia Type:General  Level of Consciousness: awake and sedated  Airway & Oxygen Therapy: Patient Spontanous Breathing and Patient connected to face mask oxygen  Post-op Assessment: Report given to RN and Post -op Vital signs reviewed and stable  Post vital signs: Reviewed and stable  Last Vitals:  Vitals Value Taken Time  BP 161/83   Temp    Pulse 57 08/22/23 1358  Resp 13 08/22/23 1358  SpO2 100 % 08/22/23 1358  Vitals shown include unfiled device data.  Last Pain:  Vitals:   08/22/23 1233  TempSrc: Oral  PainSc: 0-No pain         Complications: No notable events documented.

## 2023-08-23 ENCOUNTER — Emergency Department (HOSPITAL_COMMUNITY)

## 2023-08-23 ENCOUNTER — Other Ambulatory Visit: Payer: Self-pay

## 2023-08-23 ENCOUNTER — Inpatient Hospital Stay (HOSPITAL_COMMUNITY)
Admission: EM | Admit: 2023-08-23 | Discharge: 2023-08-31 | DRG: 862 | Disposition: A | Discharge status: Home / Self Care | Attending: Internal Medicine | Admitting: Internal Medicine

## 2023-08-23 DIAGNOSIS — K592 Neurogenic bowel, not elsewhere classified: Secondary | ICD-10-CM | POA: Diagnosis present

## 2023-08-23 DIAGNOSIS — I10 Essential (primary) hypertension: Secondary | ICD-10-CM | POA: Diagnosis present

## 2023-08-23 DIAGNOSIS — K5909 Other constipation: Secondary | ICD-10-CM | POA: Diagnosis present

## 2023-08-23 DIAGNOSIS — Z86718 Personal history of other venous thrombosis and embolism: Secondary | ICD-10-CM

## 2023-08-23 DIAGNOSIS — A419 Sepsis, unspecified organism: Principal | ICD-10-CM | POA: Diagnosis present

## 2023-08-23 DIAGNOSIS — A4152 Sepsis due to Pseudomonas: Secondary | ICD-10-CM | POA: Diagnosis present

## 2023-08-23 DIAGNOSIS — N202 Calculus of kidney with calculus of ureter: Secondary | ICD-10-CM | POA: Diagnosis present

## 2023-08-23 DIAGNOSIS — Z79899 Other long term (current) drug therapy: Secondary | ICD-10-CM | POA: Diagnosis not present

## 2023-08-23 DIAGNOSIS — N400 Enlarged prostate without lower urinary tract symptoms: Secondary | ICD-10-CM | POA: Diagnosis present

## 2023-08-23 DIAGNOSIS — Z79890 Hormone replacement therapy (postmenopausal): Secondary | ICD-10-CM

## 2023-08-23 DIAGNOSIS — Z888 Allergy status to other drugs, medicaments and biological substances status: Secondary | ICD-10-CM

## 2023-08-23 DIAGNOSIS — T8144XA Sepsis following a procedure, initial encounter: Secondary | ICD-10-CM | POA: Diagnosis present

## 2023-08-23 DIAGNOSIS — Y838 Other surgical procedures as the cause of abnormal reaction of the patient, or of later complication, without mention of misadventure at the time of the procedure: Secondary | ICD-10-CM | POA: Diagnosis present

## 2023-08-23 DIAGNOSIS — Z7401 Bed confinement status: Secondary | ICD-10-CM

## 2023-08-23 DIAGNOSIS — N201 Calculus of ureter: Secondary | ICD-10-CM | POA: Diagnosis not present

## 2023-08-23 DIAGNOSIS — F4320 Adjustment disorder, unspecified: Secondary | ICD-10-CM | POA: Diagnosis present

## 2023-08-23 DIAGNOSIS — R6520 Severe sepsis without septic shock: Secondary | ICD-10-CM | POA: Diagnosis present

## 2023-08-23 DIAGNOSIS — E119 Type 2 diabetes mellitus without complications: Secondary | ICD-10-CM | POA: Diagnosis present

## 2023-08-23 DIAGNOSIS — G2581 Restless legs syndrome: Secondary | ICD-10-CM | POA: Diagnosis present

## 2023-08-23 DIAGNOSIS — N319 Neuromuscular dysfunction of bladder, unspecified: Secondary | ICD-10-CM | POA: Diagnosis present

## 2023-08-23 DIAGNOSIS — R7881 Bacteremia: Secondary | ICD-10-CM

## 2023-08-23 DIAGNOSIS — N12 Tubulo-interstitial nephritis, not specified as acute or chronic: Secondary | ICD-10-CM | POA: Diagnosis present

## 2023-08-23 DIAGNOSIS — Z87440 Personal history of urinary (tract) infections: Secondary | ICD-10-CM

## 2023-08-23 DIAGNOSIS — B965 Pseudomonas (aeruginosa) (mallei) (pseudomallei) as the cause of diseases classified elsewhere: Secondary | ICD-10-CM | POA: Diagnosis not present

## 2023-08-23 DIAGNOSIS — S23170S Subluxation of T12/L1 thoracic vertebra, sequela: Secondary | ICD-10-CM

## 2023-08-23 DIAGNOSIS — E039 Hypothyroidism, unspecified: Secondary | ICD-10-CM | POA: Diagnosis present

## 2023-08-23 DIAGNOSIS — N390 Urinary tract infection, site not specified: Secondary | ICD-10-CM

## 2023-08-23 DIAGNOSIS — I959 Hypotension, unspecified: Secondary | ICD-10-CM | POA: Diagnosis present

## 2023-08-23 DIAGNOSIS — Z85828 Personal history of other malignant neoplasm of skin: Secondary | ICD-10-CM | POA: Diagnosis not present

## 2023-08-23 DIAGNOSIS — Z96641 Presence of right artificial hip joint: Secondary | ICD-10-CM | POA: Diagnosis present

## 2023-08-23 DIAGNOSIS — G8222 Paraplegia, incomplete: Secondary | ICD-10-CM | POA: Diagnosis present

## 2023-08-23 DIAGNOSIS — Z7982 Long term (current) use of aspirin: Secondary | ICD-10-CM | POA: Diagnosis not present

## 2023-08-23 DIAGNOSIS — Z7984 Long term (current) use of oral hypoglycemic drugs: Secondary | ICD-10-CM | POA: Diagnosis not present

## 2023-08-23 DIAGNOSIS — G8221 Paraplegia, complete: Secondary | ICD-10-CM | POA: Diagnosis present

## 2023-08-23 LAB — COMPREHENSIVE METABOLIC PANEL WITH GFR
ALT: 17 U/L (ref 0–44)
AST: 26 U/L (ref 15–41)
Albumin: 3.5 g/dL (ref 3.5–5.0)
Alkaline Phosphatase: 82 U/L (ref 38–126)
Anion gap: 14 (ref 5–15)
BUN: 20 mg/dL (ref 8–23)
CO2: 23 mmol/L (ref 22–32)
Calcium: 9.4 mg/dL (ref 8.9–10.3)
Chloride: 99 mmol/L (ref 98–111)
Creatinine, Ser: 0.79 mg/dL (ref 0.61–1.24)
GFR, Estimated: >60 mL/min (ref >60)
Glucose, Bld: 175 mg/dL — ABNORMAL HIGH (ref 70–99)
Potassium: 4.4 mmol/L (ref 3.5–5.1)
Sodium: 136 mmol/L (ref 135–145)
Total Bilirubin: 0.9 mg/dL (ref 0.0–1.2)
Total Protein: 7.5 g/dL (ref 6.5–8.1)

## 2023-08-23 LAB — URINALYSIS, W/ REFLEX TO CULTURE (INFECTION SUSPECTED)
Bilirubin Urine: NEGATIVE
Glucose, UA: NEGATIVE mg/dL
Ketones, ur: NEGATIVE mg/dL
Nitrite: NEGATIVE
Protein, ur: 30 mg/dL — AB
Specific Gravity, Urine: >1.046 — ABNORMAL HIGH (ref 1.005–1.030)
WBC, UA: >50 WBC/hpf (ref 0–5)
pH: 5 (ref 5.0–8.0)

## 2023-08-23 LAB — CBC WITH DIFFERENTIAL/PLATELET
Abs Immature Granulocytes: 0.08 10*3/uL — ABNORMAL HIGH (ref 0.00–0.07)
Basophils Absolute: 0 10*3/uL (ref 0.0–0.1)
Basophils Relative: 0 %
Eosinophils Absolute: 0 10*3/uL (ref 0.0–0.5)
Eosinophils Relative: 0 %
HCT: 46.6 % (ref 39.0–52.0)
Hemoglobin: 15.1 g/dL (ref 13.0–17.0)
Immature Granulocytes: 1 %
Lymphocytes Relative: 4 %
Lymphs Abs: 0.7 10*3/uL (ref 0.7–4.0)
MCH: 31 pg (ref 26.0–34.0)
MCHC: 32.4 g/dL (ref 30.0–36.0)
MCV: 95.7 fL (ref 80.0–100.0)
Monocytes Absolute: 0.5 10*3/uL (ref 0.1–1.0)
Monocytes Relative: 3 %
Neutro Abs: 14.6 10*3/uL — ABNORMAL HIGH (ref 1.7–7.7)
Neutrophils Relative %: 92 %
Platelets: 190 10*3/uL (ref 150–400)
RBC: 4.87 MIL/uL (ref 4.22–5.81)
RDW: 13.6 % (ref 11.5–15.5)
WBC: 15.9 10*3/uL — ABNORMAL HIGH (ref 4.0–10.5)
nRBC: 0 % (ref 0.0–0.2)

## 2023-08-23 LAB — RESP PANEL BY RT-PCR (RSV, FLU A&B, COVID)  RVPGX2
Influenza A by PCR: NEGATIVE
Influenza B by PCR: NEGATIVE
Resp Syncytial Virus by PCR: NEGATIVE
SARS Coronavirus 2 by RT PCR: NEGATIVE

## 2023-08-23 LAB — PROTIME-INR
INR: 1.1 (ref 0.8–1.2)
Prothrombin Time: 14.7 s (ref 11.4–15.2)

## 2023-08-23 LAB — I-STAT CG4 LACTIC ACID, ED
Lactic Acid, Venous: 2 mmol/L (ref 0.5–1.9)
Lactic Acid, Venous: 1.9 mmol/L (ref 0.5–1.9)

## 2023-08-23 LAB — MAGNESIUM: Magnesium: 1.8 mg/dL (ref 1.7–2.4)

## 2023-08-23 MED ORDER — SODIUM CHLORIDE 0.9 % IV SOLN
2.0000 g | Freq: Two times a day (BID) | INTRAVENOUS | Status: DC
  Administered 2023-08-24 (×2): 2 g via INTRAVENOUS
  Filled 2023-08-23 (×2): qty 12.5

## 2023-08-23 MED ORDER — ACETAMINOPHEN 325 MG PO TABS
650.0000 mg | ORAL_TABLET | Freq: Four times a day (QID) | ORAL | Status: DC | PRN
  Administered 2023-08-24 – 2023-08-30 (×4): 650 mg via ORAL
  Filled 2023-08-23 (×4): qty 2

## 2023-08-23 MED ORDER — SODIUM CHLORIDE 0.9 % IV SOLN: INTRAVENOUS | Status: DC

## 2023-08-23 MED ORDER — MORPHINE SULFATE (PF) 2 MG/ML IV SOLN: 2.0000 mg | INTRAVENOUS | Status: DC | PRN

## 2023-08-23 MED ORDER — LACTATED RINGERS IV BOLUS (SEPSIS)
1000.0000 mL | Freq: Once | INTRAVENOUS | Status: AC
  Administered 2023-08-23: 1000 mL via INTRAVENOUS

## 2023-08-23 MED ORDER — BACLOFEN 10 MG PO TABS
25.0000 mg | ORAL_TABLET | Freq: Every day | ORAL | Status: AC
  Administered 2023-08-23: 25 mg via ORAL
  Filled 2023-08-23: qty 3

## 2023-08-23 MED ORDER — ONDANSETRON HCL 4 MG/2ML IJ SOLN
4.0000 mg | Freq: Four times a day (QID) | INTRAMUSCULAR | Status: DC | PRN
  Filled 2023-08-23: qty 2

## 2023-08-23 MED ORDER — IOHEXOL 350 MG/ML SOLN
75.0000 mL | Freq: Once | INTRAVENOUS | Status: AC | PRN
  Administered 2023-08-23: 75 mL via INTRAVENOUS

## 2023-08-23 MED ORDER — SODIUM CHLORIDE 0.9 % IV SOLN
2.0000 g | Freq: Once | INTRAVENOUS | Status: AC
  Administered 2023-08-23: 2 g via INTRAVENOUS
  Filled 2023-08-23: qty 12.5

## 2023-08-23 MED ORDER — ACETAMINOPHEN 650 MG RE SUPP: 650.0000 mg | Freq: Four times a day (QID) | RECTAL | Status: DC | PRN

## 2023-08-23 MED ORDER — LACTATED RINGERS IV SOLN: INTRAVENOUS | Status: DC

## 2023-08-23 MED ORDER — ONDANSETRON HCL 4 MG PO TABS
4.0000 mg | ORAL_TABLET | Freq: Four times a day (QID) | ORAL | Status: DC | PRN
  Administered 2023-08-25: 4 mg via ORAL
  Filled 2023-08-23: qty 1

## 2023-08-23 MED ORDER — HEPARIN SODIUM (PORCINE) 5000 UNIT/ML IJ SOLN
5000.0000 [IU] | Freq: Three times a day (TID) | INTRAMUSCULAR | Status: DC
  Administered 2023-08-23 – 2023-08-31 (×24): 5000 [IU] via SUBCUTANEOUS
  Filled 2023-08-23 (×24): qty 1

## 2023-08-23 MED ORDER — ACETAMINOPHEN 500 MG PO TABS
1000.0000 mg | ORAL_TABLET | Freq: Once | ORAL | Status: AC
  Administered 2023-08-23: 1000 mg via ORAL
  Filled 2023-08-23: qty 2

## 2023-08-23 MED ORDER — LEVOTHYROXINE SODIUM 50 MCG PO TABS
50.0000 ug | ORAL_TABLET | Freq: Every day | ORAL | Status: DC
Start: 2023-08-24 — End: 2023-08-31
  Administered 2023-08-24 – 2023-08-31 (×8): 50 ug via ORAL
  Filled 2023-08-23 (×8): qty 1

## 2023-08-23 MED ORDER — LACTATED RINGERS IV BOLUS (SEPSIS)
250.0000 mL | Freq: Once | INTRAVENOUS | Status: AC
  Administered 2023-08-23: 250 mL via INTRAVENOUS

## 2023-08-23 NOTE — ED Notes (Signed)
 Report given to the yellow RN.  PT resting, family at bedside.

## 2023-08-23 NOTE — ED Triage Notes (Addendum)
 Pt bib ptar from home. Pt had surgery to remove kidney stones yesterday. Pt reports fever started this morning around 0800 and was 100.7 per wife.   EMS provided 650 mg tylenol  in route.  *per patient foley was placed yesterday**  Pt is paralyzed waist down.   EMS V/S 104F temporal  160/90bp 99%RA  '98 hr  Cbg 206
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
## 2023-08-23 NOTE — Progress Notes (Addendum)
 Patient arrived to unit from ED. No report received. Patient arrived with three IV fluid bags hanging. One full bag infused the other two restarted. BP: 70/48 HR: 57. Patient A&Ox4. Follows commands. Family at bedside. This RN made Brunilda Capra, MD aware of vitals and that bolus are still infusing.   Patient arrived with redness to sacrum and left buttock wound covered with mepilex and sacurm mepilex applied.

## 2023-08-23 NOTE — Progress Notes (Signed)
 Pharmacy Antibiotic Note  Edward Waller is a 89 y.o. male admitted on 08/23/2023 with UTI.  Pharmacy has been consulted for cefepime  dosing.  Plan: Cefepime  2g q12h.  Follow culture data for de-escalation.  Monitor renal function for dose adjustments as indicated.   Height: 6' (182.9 cm) Weight: 68 kg (150 lb) IBW/kg (Calculated) : 77.6  Temp (24hrs), Avg:99.4 F (37.4 C), Min:98.3 F (36.8 C), Max:102.6 F (39.2 C)  Recent Labs  Lab 08/22/23 1230 08/23/23 1203 08/23/23 1208 08/23/23 1352  WBC  --  15.9*  --   --   CREATININE 0.56* 0.79  --   --   LATICACIDVEN  --   --  2.0* 1.9    Estimated Creatinine Clearance: 61.4 mL/min (by C-G formula based on SCr of 0.79 mg/dL).    Allergies  Allergen Reactions   Trazodone  And Nefazodone     nightmares    Antimicrobials this admission: Cefepime  5/3 >>   Microbiology results: 5/3 BCx:  5/3 UCx:    Thank you for allowing pharmacy to be a part of this patient's care.  Mamie Searles, PharmD, BCCCP  08/23/2023 8:12 PM

## 2023-08-23 NOTE — ED Provider Notes (Signed)
 [place] [facility] AT [facility] Provider Note   CSN: [id number] Arrival date & time: 08/23/23  1132     History  Chief Complaint  Patient presents with   Fever   Post-op Problem    Edward Waller is a 89 y.o. male with past medical history significant for prostate hyperplasia, arthritis, hypertension, diabetes, who has been recently undergoing treatment related to an obstructing kidney stone in the right proximal ureter, he recently underwent ureteral stent placement on 3/31.  History of intermittent catheterization.  He was seen yesterday for cystoscopy, laser lithotripsy of the stone.   Fever      Home Medications Prior to Admission medications   Medication Sig Start Date End Date Taking? Authorizing Provider  acetaminophen  (TYLENOL ) 500 MG tablet Take 500 mg by mouth every 6 (six) hours as needed for moderate pain.    [provider]  aspirin  EC 81 MG tablet Take 1 tablet (81 mg total) by mouth daily. Swallow whole. 07/26/23   Lesa Rape, MD  baclofen  (LIORESAL ) 10 MG tablet Take 25 mg by mouth at bedtime.    [provider]  bisacodyl  (DULCOLAX) 10 MG suppository Place 1 suppository (10 mg total) rectally daily after supper. 02/13/23   Love, Renay Carota, PA-C  cephALEXin  (KEFLEX ) 500 MG capsule Take 1 capsule (500 mg total) by mouth 2 (two) times daily. 08/22/23   Lahoma Pigg, MD  docusate sodium  (COLACE) 100 MG capsule Take 1 capsule (100 mg total) by mouth daily as needed for up to 30 doses. 08/22/23   Lahoma Pigg, MD  fluconazole  (DIFLUCAN ) 200 MG tablet Take 1 tablet (200 mg total) by mouth daily. 08/18/23     fluticasone  (FLONASE ) 50 MCG/ACT nasal spray Place 1 spray into both nostrils daily as needed for allergies or rhinitis.    [provider]  levothyroxine  (SYNTHROID ) 50 MCG tablet Take 50 mcg by mouth daily before breakfast.    [provider]  melatonin 3 MG TABS tablet Take 3 mg by mouth at bedtime as needed  (sleep).    [provider]  metoprolol  succinate (TOPROL -XL) 25 MG 24 hr tablet Take 0.5 tablets (12.5 mg total) by mouth daily. 07/26/23 08/25/23  Lesa Rape, MD  miconazole (MICOTIN) 2 % powder Apply 1 Application topically as needed for itching.    [provider]  mirabegron  ER (MYRBETRIQ ) 50 MG TB24 tablet Take 1 tablet (50 mg total) by mouth daily. 02/13/23   Love, Renay Carota, PA-C  Multiple Vitamin (MULTIVITAMIN WITH MINERALS) TABS tablet Take 1 tablet by mouth every morning.    [provider]  oxyCODONE -acetaminophen  (PERCOCET) 5-325 MG tablet Take 1 tablet by mouth every 4 (four) hours as needed for up to 18 doses for severe pain (pain score 7-10). 08/22/23   Lahoma Pigg, MD  polyethylene glycol (MIRALAX  / GLYCOLAX ) 17 g packet Take 34 g by mouth daily. Mix in 8 ounces a fluid per day 02/13/23   Zelda Hickman, PA-C  polyvinyl alcohol  (LIQUIFILM TEARS) 1.4 % ophthalmic solution Place 1 drop into the right eye as needed for dry eyes (put at bedside for pt). 07/24/21   Love, Renay Carota, PA-C  senna-docusate (SENOKOT-S) 8.6-50 MG tablet Take 3 tablets by mouth daily at 6 (six) AM. Patient taking differently: Take 2 tablets by mouth daily at 6 (six) AM. 02/13/23   Love, Renay Carota, PA-C  solifenacin (VESICARE) 5 MG tablet Take 5 mg by mouth daily.  [provider]  Zinc Oxide 16 % OINT Apply 1 application  topically daily as needed.    [provider]      Allergies    Trazodone  and nefazodone    Review of Systems   Review of Systems  Constitutional:  Positive for fever.  All other systems reviewed and are negative.   Physical Exam Updated Vital Signs BP 117/64   Pulse (!) 105   Temp 98.3 F (36.8 C) (Oral)   Resp (!) 22   Ht 6' (1.829 m)   Wt 68 kg   SpO2 97%   BMI 20.34 kg/m  Physical Exam Vitals and nursing note reviewed.  Constitutional:      General: He is not in acute distress.    Appearance: Normal appearance. He is  ill-appearing.  HENT:     Head: Normocephalic and atraumatic.  Eyes:     General:        Right eye: No discharge.        Left eye: No discharge.  Cardiovascular:     Rate and Rhythm: Regular rhythm. Tachycardia present.     Heart sounds: No murmur heard.    No friction rub. No gallop.  Pulmonary:     Effort: Pulmonary effort is normal.     Breath sounds: Normal breath sounds.  Abdominal:     General: Bowel sounds are normal.     Palpations: Abdomen is soft.     Comments: No significant tenderness to palpation of the abdomen, no rebound, rigidity, guarding throughout  Skin:    General: Skin is warm and dry.     Capillary Refill: Capillary refill takes less than 2 seconds.  Neurological:     Mental Status: He is alert and oriented to person, place, and time.  Psychiatric:        Mood and Affect: Mood normal.        Behavior: Behavior normal.     ED Results / Procedures / Treatments   Labs (all labs ordered are listed, but only abnormal results are displayed) Labs Reviewed  COMPREHENSIVE METABOLIC PANEL WITH GFR - Abnormal; Notable for the following components:      Result Value   Glucose, Bld 175 (*)    All other components within normal limits  CBC WITH DIFFERENTIAL/PLATELET - Abnormal; Notable for the following components:   WBC 15.9 (*)    Neutro Abs 14.6 (*)    Abs Immature Granulocytes 0.08 (*)    All other components within normal limits  I-STAT CG4 LACTIC ACID, ED - Abnormal; Notable for the following components:   Lactic Acid, Venous 2.0 (*)    All other components within normal limits  RESP PANEL BY RT-PCR (RSV, FLU A&B, COVID)  RVPGX2  CULTURE, BLOOD (ROUTINE X 2)  CULTURE, BLOOD (ROUTINE X 2)  PROTIME-INR  URINALYSIS, W/ REFLEX TO CULTURE (INFECTION SUSPECTED)  I-STAT CG4 LACTIC ACID, ED    EKG None  Radiology CT ABDOMEN PELVIS W CONTRAST Result Date: 08/23/2023 CLINICAL DATA:  Abdominal pain. EXAM: CT ABDOMEN AND PELVIS WITH CONTRAST TECHNIQUE:  Multidetector CT imaging of the abdomen and pelvis was performed using the standard protocol following bolus administration of intravenous contrast. RADIATION DOSE REDUCTION: This exam was performed according to the departmental dose-optimization program which includes automated exposure control, adjustment of the mA and/or kV according to patient size and/or use of iterative reconstruction technique. CONTRAST:  75mL OMNIPAQUE  IOHEXOL  350 MG/ML SOLN COMPARISON:  CT abdomen pelvis dated 07/20/2023. FINDINGS: Evaluation  of this exam is limited due to respiratory motion. Lower chest: The visualized lung bases are clear. There is coronary vascular calcification. No intra-abdominal free air or free fluid. Hepatobiliary: Small liver cysts and additional subcentimeter hypodense lesions which are too small to characterize. No biliary dilatation. The gallbladder is unremarkable. Pancreas: Moderately atrophic. No dilatation of the main pancreatic duct. No active inflammation. A 12 mm hypodense lesion arising from the distal pancreas not characterized, likely a side branch IPMN. Attention on follow-up imaging recommended. Spleen: Normal in size without focal abnormality. Adrenals/Urinary Tract: The adrenal glands unremarkable. Right ureteral stent with pigtail tip in the upper pole collecting system. No hydronephrosis. Faint areas of hypoenhancement primarily in the interpolar right kidney most consistent with pyelonephritis. Correlation with urinalysis recommended. Small left renal upper pole cyst. There is no hydronephrosis on the left. The urinary bladder is decompressed around a Foley catheter. Stomach/Bowel: There is moderate stool throughout the colon. There is sigmoid diverticulosis without active inflammatory changes. There is no bowel obstruction or active inflammation. The appendix is normal. Vascular/Lymphatic: Mild aortoiliac atherosclerotic disease with the IVC is unremarkable. No portal venous gas. There is no  adenopathy. Reproductive: The prostate and seminal vesicles are grossly unremarkable Other: None Musculoskeletal: Osteopenia with scoliosis and degenerative changes spine. Total right hip arthroplasty. No acute osseous pathology. IMPRESSION: 1. Right ureteral stent in place. No hydronephrosis. 2. Right pyelonephritis.  Correlation with urinalysis recommended. 3. Sigmoid diverticulosis. No bowel obstruction. Normal appendix. 4.  Aortic Atherosclerosis (ICD10-I70.0). Electronically Signed   By: Angus Bark M.D.   On: 08/23/2023 14:15   DG Chest Port 1 View Result Date: 08/23/2023 CLINICAL DATA:  Questionable sepsis.  Evaluate for abnormality. EXAM: PORTABLE CHEST 1 VIEW COMPARISON:  07/20/2023 FINDINGS: Heart size and mediastinal contours appear normal. No pleural effusion or interstitial edema. Subsegmental atelectasis within the periphery of the left base. No airspace consolidation. Status post posterior hardware fixation of the mid thoracic spine. IMPRESSION: Left base subsegmental atelectasis. Electronically Signed   By: Kimberley Penman M.D.   On: 08/23/2023 12:20   DG C-Arm 1-60 Min Result Date: 08/22/2023 CLINICAL DATA:  Cystoscopy, ureteroscopy, laser, stent placement EXAM: DG C-ARM 1-60 MIN FLUOROSCOPY: Fluoroscopy Time:  23.1 seconds Radiation Exposure Index (if provided by the fluoroscopic device): 3.92 mGy Number of Acquired Spot Images: 0 COMPARISON:  CT 07/20/2023 FINDINGS: Single intraoperative spot image demonstrates contrast within dilated right renal calices. Right ureteral stent is in place. The distal aspect of the stent not visualized. IMPRESSION: Placement of right ureteral stent. Electronically Signed   By: Janeece Mechanic M.D.   On: 08/22/2023 17:22    Procedures .Critical Care  Performed by: Nelly Banco, PA-C Authorized by: Nelly Banco, PA-C   Critical care provider statement:    Critical care time (minutes):  35   Critical care was necessary to treat or  prevent imminent or life-threatening deterioration of the following conditions:  Sepsis   Critical care was time spent personally by me on the following activities:  Development of treatment plan with patient or surrogate, discussions with consultants, evaluation of patient's response to treatment, examination of patient, ordering and review of laboratory studies, ordering and review of radiographic studies, ordering and performing treatments and interventions, pulse oximetry, re-evaluation of patient's condition and review of old charts   Care discussed with: admitting provider       Medications Ordered in ED Medications  lactated ringers  infusion (has no administration in time range)  lactated ringers  bolus 1,000  mL (1,000 mLs Intravenous New Bag/Given 08/23/23 1430)    And  lactated ringers  bolus 1,000 mL (1,000 mLs Intravenous New Bag/Given 08/23/23 1457)    And  lactated ringers  bolus 250 mL (250 mLs Intravenous New Bag/Given 08/23/23 1457)  ceFEPIme  (MAXIPIME ) 2 g in sodium chloride  0.9 % 100 mL IVPB (0 g Intravenous Stopped 08/23/23 1457)  acetaminophen  (TYLENOL ) tablet 1,000 mg (1,000 mg Oral Given 08/23/23 1422)  iohexol  (OMNIPAQUE ) 350 MG/ML injection 75 mL (75 mLs Intravenous Contrast Given 08/23/23 1359)    ED Course/ Medical Decision Making/ A&P                                 Medical Decision Making Amount and/or Complexity of Data Reviewed Labs: ordered. Radiology: ordered.  Risk OTC drugs. Prescription drug management.   This patient is a 89 y.o. male who presents to the ED for concern of sepsis, this involves an extensive number of treatment options, and is a complaint that carries with it a high risk of complications and morbidity. The emergent differential diagnosis prior to evaluation includes, but is not limited to, high clinical suspicion of sepsis related to his recent urologic procedure, laser lithotripsy, and ureteral stent. This is not an exhaustive differential.    Past Medical History / Co-morbidities / Social History: prostate hyperplasia, arthritis, hypertension, diabetes  Additional history: Chart reviewed. Pertinent results include: Extensively reviewed lab work, imaging from recent hospital admissions, urologic procedures including yesterday with laser lithotripsy  Physical Exam: Physical exam performed. The pertinent findings include: Ill-appearing, in obvious sepsis on arrival, temperature 102.6, tachycardic with pulse of 111, 27 respirations, initially hypertensive blood pressure 156/79 with improvement to 105/60, given his septic state we will closely monitor if it continues to drop may need additional medication, for now we will receiving 30 cc/kg fluid bolus for obvious sepsis.  Lab Tests: I ordered, and personally interpreted labs.  The pertinent results include: CMP overall unremarkable other than mildly elevated glucose for nonfasting lab values of 175, his CBC is notable for significant leukocytosis, white blood cells 15.9, his initial lactic acid is very mildly elevated at 2, improved to 1.9 after fluid resuscitation, PT/INR within normal range.  Urinalysis from catheter, blood cultures and RPP are pending.   Imaging Studies: I ordered imaging studies including CT abdomen pelvis with contrast, plain film chest x-ray. I independently visualized and interpreted imaging which showed ureteral stent in place on the right, with findings consistent with pyelonephritis additionally on the right.  No evidence of hydroureter or blockage at this time.. I agree with the radiologist interpretation.   Cardiac Monitoring:  The patient was maintained on a cardiac monitor.  My attending physician Dr. Zackowski viewed and interpreted the cardiac monitored which showed an underlying rhythm of: sinus tachycardia. I agree with this interpretation.   Medications: I ordered medication including cefepime  for suspected hospital-acquired pyelonephritis, Tylenol   for fever, fluid bolus for obvious sepsis on arrival   Consultations Obtained: I requested consultation with the hospitalist, spoke with Dr. Lydia Sams,  and discussed lab and imaging findings as well as pertinent plan - they recommend: Admission for sepsis related to pyelonephritis secondary to recent urologic procedure   Disposition: After consideration of the diagnostic results and the patients response to treatment, I feel that patient would benefit from admission at this time .   I discussed this case with my attending physician Dr. Zackowski who cosigned this note including  patient's presenting symptoms, physical exam, and planned diagnostics and interventions. Attending physician stated agreement with plan or made changes to plan which were implemented.    Final Clinical Impression(s) / ED Diagnoses Final diagnoses:  None    Rx / DC Orders ED Discharge Orders     None         Nelly Banco, PA-C 08/23/23 1506    Nicklas Barns, MD 08/24/23 [phone]

## 2023-08-23 NOTE — Progress Notes (Signed)
 F/U  note received note from nurse about BP being in systolic 70's. Suspect pt will need VP, will closely monitor.

## 2023-08-23 NOTE — Sepsis Progress Note (Signed)
 Sepsis protocol is being followed by [facility].

## 2023-08-23 NOTE — ED Notes (Signed)
 CCMD notified. Pt is on monitor.

## 2023-08-24 ENCOUNTER — Inpatient Hospital Stay (HOSPITAL_COMMUNITY)

## 2023-08-24 DIAGNOSIS — A419 Sepsis, unspecified organism: Secondary | ICD-10-CM | POA: Diagnosis not present

## 2023-08-24 DIAGNOSIS — N390 Urinary tract infection, site not specified: Secondary | ICD-10-CM | POA: Diagnosis not present

## 2023-08-24 LAB — BLOOD CULTURE ID PANEL (REFLEXED) - BCID2

## 2023-08-24 LAB — COMPREHENSIVE METABOLIC PANEL WITH GFR
ALT: 14 U/L (ref 0–44)
AST: 19 U/L (ref 15–41)
Albumin: 2.5 g/dL — ABNORMAL LOW (ref 3.5–5.0)
Alkaline Phosphatase: 58 U/L (ref 38–126)
Anion gap: 9 (ref 5–15)
BUN: 15 mg/dL (ref 8–23)
CO2: 24 mmol/L (ref 22–32)
Calcium: 8.1 mg/dL — ABNORMAL LOW (ref 8.9–10.3)
Chloride: 102 mmol/L (ref 98–111)
Creatinine, Ser: 0.8 mg/dL (ref 0.61–1.24)
GFR, Estimated: >60 mL/min (ref >60)
Glucose, Bld: 193 mg/dL — ABNORMAL HIGH (ref 70–99)
Potassium: 3.7 mmol/L (ref 3.5–5.1)
Sodium: 135 mmol/L (ref 135–145)
Total Bilirubin: 0.9 mg/dL (ref 0.0–1.2)
Total Protein: 6 g/dL — ABNORMAL LOW (ref 6.5–8.1)

## 2023-08-24 LAB — CBC
HCT: 36.1 % — ABNORMAL LOW (ref 39.0–52.0)
Hemoglobin: 12.1 g/dL — ABNORMAL LOW (ref 13.0–17.0)
MCH: 31.8 pg (ref 26.0–34.0)
MCHC: 33.5 g/dL (ref 30.0–36.0)
MCV: 94.8 fL (ref 80.0–100.0)
Platelets: 137 10*3/uL — ABNORMAL LOW (ref 150–400)
RBC: 3.81 MIL/uL — ABNORMAL LOW (ref 4.22–5.81)
RDW: 14.1 % (ref 11.5–15.5)
WBC: 20.6 10*3/uL — ABNORMAL HIGH (ref 4.0–10.5)
nRBC: 0 % (ref 0.0–0.2)

## 2023-08-24 LAB — GLUCOSE, CAPILLARY: Glucose-Capillary: 170 mg/dL — ABNORMAL HIGH (ref 70–99)

## 2023-08-24 LAB — MAGNESIUM: Magnesium: 1.7 mg/dL (ref 1.7–2.4)

## 2023-08-24 MED ORDER — INSULIN ASPART 100 UNIT/ML IJ SOLN: 0.0000 [IU] | Freq: Every day | INTRAMUSCULAR | Status: DC

## 2023-08-24 MED ORDER — OXYCODONE-ACETAMINOPHEN 5-325 MG PO TABS: 1.0000 | ORAL_TABLET | Freq: Four times a day (QID) | ORAL | Status: DC | PRN

## 2023-08-24 MED ORDER — SODIUM CHLORIDE 0.9 % IV SOLN: INTRAVENOUS | Status: DC

## 2023-08-24 MED ORDER — INSULIN ASPART 100 UNIT/ML IJ SOLN
0.0000 [IU] | Freq: Three times a day (TID) | INTRAMUSCULAR | Status: DC
  Administered 2023-08-25 (×2): 1 [IU] via SUBCUTANEOUS
  Administered 2023-08-25 – 2023-08-26 (×2): 2 [IU] via SUBCUTANEOUS
  Administered 2023-08-26: 1 [IU] via SUBCUTANEOUS
  Administered 2023-08-27: 2 [IU] via SUBCUTANEOUS
  Administered 2023-08-28: 1 [IU] via SUBCUTANEOUS

## 2023-08-24 MED ORDER — SODIUM CHLORIDE 0.9 % IV SOLN
1.0000 g | Freq: Three times a day (TID) | INTRAVENOUS | Status: DC
  Administered 2023-08-24: 1 g via INTRAVENOUS
  Filled 2023-08-24: qty 20

## 2023-08-24 MED ORDER — SODIUM CHLORIDE 0.9 % IV SOLN
1.0000 g | Freq: Three times a day (TID) | INTRAVENOUS | Status: AC
  Administered 2023-08-25 – 2023-08-31 (×20): 1 g via INTRAVENOUS
  Filled 2023-08-24 (×20): qty 20

## 2023-08-24 MED ORDER — FLUCONAZOLE 200 MG PO TABS
200.0000 mg | ORAL_TABLET | Freq: Every day | ORAL | Status: DC
  Administered 2023-08-24 – 2023-08-27 (×4): 200 mg via ORAL
  Filled 2023-08-24 (×4): qty 1

## 2023-08-24 MED ORDER — CHLORHEXIDINE GLUCONATE CLOTH 2 % EX PADS
6.0000 | MEDICATED_PAD | Freq: Every day | CUTANEOUS | Status: DC
  Administered 2023-08-24 – 2023-08-31 (×5): 6 via TOPICAL

## 2023-08-24 NOTE — Progress Notes (Addendum)
 Received a  report that foley catheter was placed in the ED, but no order was placed to continue the foley catheter. I notify the on call C. Hall DO last night if I can get an order if foley needs to be continued, According to her continue home regimen. Patient was on [place] last night so I removed the foley catheter around 0630 and noticed that the tip of the foley has thread anchored to it. I cut the thread from the catheter and taped it on the patient lower pelvic region. Dr. Charlean Congress notified.  Day shift nurse seen and examine it during handoff.
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
## 2023-08-24 NOTE — Progress Notes (Signed)
 PHARMACY - PHYSICIAN COMMUNICATION CRITICAL VALUE ALERT - BLOOD CULTURE IDENTIFICATION (BCID)  Edward Waller is an 89 y.o. male who presented to [facility] on 08/23/2023 with a chief complaint of fevering s/p ureteral stent 5/2  Assessment:  Pseudomonas aeruginosa - previously resistant to ceftazidime  Name of physician (or Provider) Contacted: Glynda Lash  Current antibiotics: Meropenem   Changes to prescribed antibiotics recommended:  Patient is on recommended antibiotics - No changes needed  Results for orders placed or performed during the hospital encounter of 08/23/23  Blood Culture ID Panel (Reflexed) (Collected: 08/23/2023  1:44 PM)  Result Value Ref Range   Enterococcus faecalis NOT DETECTED NOT DETECTED   Enterococcus Faecium NOT DETECTED NOT DETECTED   Listeria monocytogenes NOT DETECTED NOT DETECTED   Staphylococcus species NOT DETECTED NOT DETECTED   Staphylococcus aureus (BCID) NOT DETECTED NOT DETECTED   Staphylococcus epidermidis NOT DETECTED NOT DETECTED   Staphylococcus lugdunensis NOT DETECTED NOT DETECTED   Streptococcus species NOT DETECTED NOT DETECTED   Streptococcus agalactiae NOT DETECTED NOT DETECTED   Streptococcus pneumoniae NOT DETECTED NOT DETECTED   Streptococcus pyogenes NOT DETECTED NOT DETECTED   A.calcoaceticus-baumannii NOT DETECTED NOT DETECTED   Bacteroides fragilis NOT DETECTED NOT DETECTED   Enterobacterales NOT DETECTED NOT DETECTED   Enterobacter cloacae complex NOT DETECTED NOT DETECTED   Escherichia coli NOT DETECTED NOT DETECTED   Klebsiella aerogenes NOT DETECTED NOT DETECTED   Klebsiella oxytoca NOT DETECTED NOT DETECTED   Klebsiella pneumoniae NOT DETECTED NOT DETECTED   Proteus species NOT DETECTED NOT DETECTED   Salmonella species NOT DETECTED NOT DETECTED   Serratia marcescens NOT DETECTED NOT DETECTED   Haemophilus influenzae NOT DETECTED NOT DETECTED   Neisseria meningitidis NOT DETECTED NOT DETECTED   Pseudomonas aeruginosa  DETECTED (A) NOT DETECTED   Stenotrophomonas maltophilia NOT DETECTED NOT DETECTED   Candida albicans NOT DETECTED NOT DETECTED   Candida auris NOT DETECTED NOT DETECTED   Candida glabrata NOT DETECTED NOT DETECTED   Candida krusei NOT DETECTED NOT DETECTED   Candida parapsilosis NOT DETECTED NOT DETECTED   Candida tropicalis NOT DETECTED NOT DETECTED   Cryptococcus neoformans/gattii NOT DETECTED NOT DETECTED   CTX-M ESBL NOT DETECTED NOT DETECTED   Carbapenem resistance IMP NOT DETECTED NOT DETECTED   Carbapenem resistance KPC NOT DETECTED NOT DETECTED   Carbapenem resistance NDM NOT DETECTED NOT DETECTED   Carbapenem resistance VIM NOT DETECTED NOT DETECTED    Baker Bon L Enrika Aguado 08/24/2023  8:16 PM

## 2023-08-24 NOTE — Progress Notes (Signed)
 Urology Consult Note   Requesting Attending Physician:  Kraig Peru, MD Service Providing Consult: Urology  Consulting Attending: Homero Luster, MD   Reason for Consult:  ureteral stent  HPI: Edward Waller is seen in consultation for reasons noted above at the request of Kraig Peru, MD for evaluation of urosepsis.  This is a 89 y.o. male with a history of BPH, nephrolithiasis, paraplegia, neurogenic bladder managed with CIC, T2DM who underwent right URS/LL with stent placement on 5/2. He was discharged with stent on string taped to foley with plans for removal of both on 5/5. Preoperative ucx grew yeast for which he was pre-treated with diflucan . He has a history of pseudomonas UTI.   The patient initially did well however on POD#1 developed fevers hypotension at home prompting his wife to bring him to the ED. Tmax notably 104 in the ER and sepsis fluids were initiated along with broad spectrum abx. Cr on admission 0.56, WBC 15.9.   CT obtained showing stent in adequate position. His urethral foley was removed by nursing overnight along with some length of the string. KUB confirms proximal curl remains in the pelvis but the distal curl is in the urethra. I replaced his foley at bedside this AM.    Past Medical History: 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   Cataract    bilateral   Fx ankle    left; Dec 2021   Hip pain, chronic, right    History of kidney stones    Hypertension    Hypothyroidism    Liver cyst    Low back pain    OA (osteoarthritis) of hip    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    Renal cyst, left    Restless leg syndrome    Sleep apnea    Spinal stenosis    Thyroid disease     Past Surgical History:  Past Surgical History:  Procedure Laterality Date   BACK SURGERY     CYSTOSCOPY W/ URETERAL STENT PLACEMENT Right 07/21/2023   Procedure:  CYSTOSCOPY, WITH RETROGRADE PYELOGRAM AND URETERAL STENT INSERTION;  Surgeon: Marco Severs, MD;  Location: [facility];  Service: Urology;  Laterality: Right;   EYE SURGERY     bilateral cataract removal with Lens   LAMINECTOMY WITH POSTERIOR LATERAL ARTHRODESIS LEVEL 3 N/A 01/03/2023   Procedure: OPEN REDUCTION INTERNAL FIXATION THORACIC SEVEN -THORACIC NINE;  Surgeon: Audie Bleacher, MD;  Location: [facility];  Service: Neurosurgery;  Laterality: N/A;   TIBIA IM NAIL INSERTION Right 07/06/2021   Procedure: INTRAMEDULLARY (IM) NAIL TIBIAL;  Surgeon: Hardy Lia, MD;  Location: [facility];  Service: Orthopedics;  Laterality: Right;   TOTAL HIP ARTHROPLASTY Right 03/20/2022   Procedure: TOTAL HIP ARTHROPLASTY ANTERIOR APPROACH;  Surgeon: Liliane Rei, MD;  Location: [facility];  Service: Orthopedics;  Laterality: Right;   TOTAL HIP ARTHROPLASTY Right 2023    Medication: Current Facility-Administered Medications  Medication Dose Route Frequency Provider Last Rate Last Admin   0.9 %  sodium chloride  infusion   Intravenous Continuous Patel, Pranav M, MD       acetaminophen  (TYLENOL ) tablet 650 mg  650 mg Oral Q6H PRN Patel, Ekta V, MD   650 mg at 08/24/23 0036   Or   acetaminophen  (TYLENOL ) suppository 650 mg  650 mg Rectal Q6H PRN Patel, Ekta V, MD  ceFEPIme  (MAXIPIME ) 2 g in sodium chloride  0.9 % 100 mL IVPB  2 g Intravenous Q12H Synthia Ewing, RPH 200 mL/hr at 08/24/23 0104 2 g at 08/24/23 0104   fluconazole  (DIFLUCAN ) tablet 200 mg  200 mg Oral Daily Weingarten, Rachael I, RPH       heparin injection 5,000 Units  5,000 Units Subcutaneous Q8H Brunilda Capra V, MD   5,000 Units at 08/24/23 0536   levothyroxine  (SYNTHROID ) tablet 50 mcg  50 mcg Oral QAC breakfast Reesa Cannon N, DO   50 mcg at 08/24/23 7829   morphine  (PF) 2 MG/ML injection 2 mg  2 mg Intravenous Q4H PRN Patel, Ekta V, MD       ondansetron  (ZOFRAN ) tablet 4 mg  4 mg Oral Q6H PRN Patel, Ekta V, MD       Or   ondansetron  (ZOFRAN )  injection 4 mg  4 mg Intravenous Q6H PRN Lavanda Porter, MD        Allergies: Allergies  Allergen Reactions   Trazodone  And Nefazodone     nightmares    Social History: Social History   Tobacco Use   Smoking status: Never   Smokeless tobacco: Never  Vaping Use   Vaping status: Never Used  Substance Use Topics   Alcohol  use: Never   Drug use: Never    Family History No family history on file.  Review of Systems 10 systems were reviewed and are negative except as noted specifically in the HPI.  Objective   Vital signs in last 24 hours: BP 95/61 (BP Location: Right Arm)   Pulse 92   Temp 99.4 F (37.4 C) (Oral)   Resp 14   Ht 6' (1.829 m)   Wt 69 kg   SpO2 96%   BMI 20.63 kg/m   Physical Exam General: NAD, A&O, resting, appropriate HEENT: [place]/AT, EOMI, MMM Pulmonary: Normal work of breathing on nasal cannula Cardiovascular: HDS, adequate peripheral perfusion Abdomen: Soft, NTTP, nondistended, GU: foley in place with clear yellow urine, stent string at meatus, no CVA tenderness Extremities: warm and well perfused Neuro: Appropriate  Most Recent Labs: Lab Results  Component Value Date   WBC 20.6 (H) 08/24/2023   HGB 12.1 (L) 08/24/2023   HCT 36.1 (L) 08/24/2023   PLT 137 (L) 08/24/2023    Lab Results  Component Value Date   NA 135 08/24/2023   K 3.7 08/24/2023   CL 102 08/24/2023   CO2 24 08/24/2023   BUN 15 08/24/2023   CREATININE 0.80 08/24/2023   CALCIUM  8.1 (L) 08/24/2023   MG 1.7 08/24/2023    Lab Results  Component Value Date   INR 1.1 08/23/2023     Urine Culture: @[id number] (laburin,org,[id number])@   IMAGING: DG Abd 1 View Result Date: 08/24/2023 CLINICAL DATA:  Ureteral stent placement.  Cyst EXAM: ABDOMEN - 1 VIEW COMPARISON:  02/11/2023. FINDINGS: The bowel gas pattern is normal. Several mm sized calcific densities overlie the right kidney consistent with nephrolithiasis. Right ureteral stent in place. Proximal loop overlies  the expected location right renal pelvis. Distal portion of stent appears to be within the urethra. IMPRESSION: Right nephrolithiasis. Distal right ureteral stent entering the urethra. Electronically Signed   By: Sydell Eva M.D.   On: 08/24/2023 09:02   CT ABDOMEN PELVIS W CONTRAST Result Date: 08/23/2023 CLINICAL DATA:  Abdominal pain. EXAM: CT ABDOMEN AND PELVIS WITH CONTRAST TECHNIQUE: Multidetector CT imaging of the abdomen and pelvis was performed using the standard protocol following bolus administration of  intravenous contrast. RADIATION DOSE REDUCTION: This exam was performed according to the departmental dose-optimization program which includes automated exposure control, adjustment of the mA and/or kV according to patient size and/or use of iterative reconstruction technique. CONTRAST:  75mL OMNIPAQUE  IOHEXOL  350 MG/ML SOLN COMPARISON:  CT abdomen pelvis dated 07/20/2023. FINDINGS: Evaluation of this exam is limited due to respiratory motion. Lower chest: The visualized lung bases are clear. There is coronary vascular calcification. No intra-abdominal free air or free fluid. Hepatobiliary: Small liver cysts and additional subcentimeter hypodense lesions which are too small to characterize. No biliary dilatation. The gallbladder is unremarkable. Pancreas: Moderately atrophic. No dilatation of the main pancreatic duct. No active inflammation. A 12 mm hypodense lesion arising from the distal pancreas not characterized, likely a side branch IPMN. Attention on follow-up imaging recommended. Spleen: Normal in size without focal abnormality. Adrenals/Urinary Tract: The adrenal glands unremarkable. Right ureteral stent with pigtail tip in the upper pole collecting system. No hydronephrosis. Faint areas of hypoenhancement primarily in the interpolar right kidney most consistent with pyelonephritis. Correlation with urinalysis recommended. Small left renal upper pole cyst. There is no hydronephrosis on the  left. The urinary bladder is decompressed around a Foley catheter. Stomach/Bowel: There is moderate stool throughout the colon. There is sigmoid diverticulosis without active inflammatory changes. There is no bowel obstruction or active inflammation. The appendix is normal. Vascular/Lymphatic: Mild aortoiliac atherosclerotic disease with the IVC is unremarkable. No portal venous gas. There is no adenopathy. Reproductive: The prostate and seminal vesicles are grossly unremarkable Other: None Musculoskeletal: Osteopenia with scoliosis and degenerative changes spine. Total right hip arthroplasty. No acute osseous pathology. IMPRESSION: 1. Right ureteral stent in place. No hydronephrosis. 2. Right pyelonephritis.  Correlation with urinalysis recommended. 3. Sigmoid diverticulosis. No bowel obstruction. Normal appendix. 4.  Aortic Atherosclerosis (ICD10-I70.0). Electronically Signed   By: Angus Bark M.D.   On: 08/23/2023 14:15   DG Chest Port 1 View Result Date: 08/23/2023 CLINICAL DATA:  Questionable sepsis.  Evaluate for abnormality. EXAM: PORTABLE CHEST 1 VIEW COMPARISON:  07/20/2023 FINDINGS: Heart size and mediastinal contours appear normal. No pleural effusion or interstitial edema. Subsegmental atelectasis within the periphery of the left base. No airspace consolidation. Status post posterior hardware fixation of the mid thoracic spine. IMPRESSION: Left base subsegmental atelectasis. Electronically Signed   By: Kimberley Penman M.D.   On: 08/23/2023 12:20   DG C-Arm 1-60 Min Result Date: 08/22/2023 CLINICAL DATA:  Cystoscopy, ureteroscopy, laser, stent placement EXAM: DG C-ARM 1-60 MIN FLUOROSCOPY: Fluoroscopy Time:  23.1 seconds Radiation Exposure Index (if provided by the fluoroscopic device): 3.92 mGy Number of Acquired Spot Images: 0 COMPARISON:  CT 07/20/2023 FINDINGS: Single intraoperative spot image demonstrates contrast within dilated right renal calices. Right ureteral stent is in place. The  distal aspect of the stent not visualized. IMPRESSION: Placement of right ureteral stent. Electronically Signed   By: Janeece Mechanic M.D.   On: 08/22/2023 17:22    ------  Assessment:  This is a 89 y.o. male with a history of BPH, nephrolithiasis, paraplegia, neurogenic bladder managed with CIC, T2DM who underwent right URS/LL with stent placement on 5/2. He is admitted with urosepsis and noted to have inadvertent partial stent dislodgement.   His foley was replaced at bedside and stent strings remain external. KUB shows proximal curl in position with distal end in the urethra. I would like to keep his indwelling foley in place until he is afebrile x 24 hours, however he may have some leakage around the  foley given the location of the distal curl. If he does well tonight can plan for removal of foley and ureteral stent tomorrow and resumption of home CIC regimen.  Recommendations: - Continue indwelling foley and stent at this time - If patient remains afebrile x24 hours, nursing to remove indwelling foley and ureteral stent on 08/25/23 - Continue abx (cefepime  and fluconazole ), cultures pending - Once foley is removed patient can resume his home CIC regimen   Thank you for this consult. Please contact the urology consult pager with any further questions/concerns.

## 2023-08-25 ENCOUNTER — Encounter (HOSPITAL_COMMUNITY): Payer: Self-pay | Admitting: Urology

## 2023-08-25 DIAGNOSIS — N390 Urinary tract infection, site not specified: Secondary | ICD-10-CM | POA: Diagnosis not present

## 2023-08-25 DIAGNOSIS — A419 Sepsis, unspecified organism: Secondary | ICD-10-CM | POA: Diagnosis not present

## 2023-08-25 LAB — CBC
HCT: 34.8 % — ABNORMAL LOW (ref 39.0–52.0)
Hemoglobin: 11.7 g/dL — ABNORMAL LOW (ref 13.0–17.0)
MCH: 32 pg (ref 26.0–34.0)
MCHC: 33.6 g/dL (ref 30.0–36.0)
MCV: 95.1 fL (ref 80.0–100.0)
Platelets: 114 10*3/uL — ABNORMAL LOW (ref 150–400)
RBC: 3.66 MIL/uL — ABNORMAL LOW (ref 4.22–5.81)
RDW: 14 % (ref 11.5–15.5)
WBC: 14.1 10*3/uL — ABNORMAL HIGH (ref 4.0–10.5)
nRBC: 0 % (ref 0.0–0.2)

## 2023-08-25 LAB — BASIC METABOLIC PANEL WITH GFR
Anion gap: 5 (ref 5–15)
BUN: 12 mg/dL (ref 8–23)
CO2: 23 mmol/L (ref 22–32)
Calcium: 7.9 mg/dL — ABNORMAL LOW (ref 8.9–10.3)
Chloride: 107 mmol/L (ref 98–111)
Creatinine, Ser: 0.53 mg/dL — ABNORMAL LOW (ref 0.61–1.24)
GFR, Estimated: >60 mL/min (ref >60)
Glucose, Bld: 133 mg/dL — ABNORMAL HIGH (ref 70–99)
Potassium: 3.4 mmol/L — ABNORMAL LOW (ref 3.5–5.1)
Sodium: 135 mmol/L (ref 135–145)

## 2023-08-25 LAB — URINE CULTURE: Culture: 20000 — AB

## 2023-08-25 LAB — GLUCOSE, CAPILLARY
Glucose-Capillary: 180 mg/dL — ABNORMAL HIGH (ref 70–99)
Glucose-Capillary: 140 mg/dL — ABNORMAL HIGH (ref 70–99)
Glucose-Capillary: 138 mg/dL — ABNORMAL HIGH (ref 70–99)
Glucose-Capillary: 118 mg/dL — ABNORMAL HIGH (ref 70–99)

## 2023-08-25 LAB — MAGNESIUM: Magnesium: 1.8 mg/dL (ref 1.7–2.4)

## 2023-08-25 MED ORDER — MELATONIN 3 MG PO TABS
3.0000 mg | ORAL_TABLET | Freq: Every evening | ORAL | Status: DC | PRN
  Administered 2023-08-25: 3 mg via ORAL
  Administered 2023-08-27: 6 mg via ORAL
  Filled 2023-08-25 (×2): qty 2

## 2023-08-25 MED ORDER — ASPIRIN 81 MG PO TBEC
81.0000 mg | DELAYED_RELEASE_TABLET | Freq: Every day | ORAL | Status: DC
  Administered 2023-08-26 – 2023-08-31 (×6): 81 mg via ORAL
  Filled 2023-08-25 (×6): qty 1

## 2023-08-25 MED ORDER — POTASSIUM CHLORIDE CRYS ER 20 MEQ PO TBCR
40.0000 meq | EXTENDED_RELEASE_TABLET | Freq: Once | ORAL | Status: AC
  Administered 2023-08-25: 40 meq via ORAL
  Filled 2023-08-25: qty 2

## 2023-08-25 MED ORDER — BISACODYL 10 MG RE SUPP
10.0000 mg | Freq: Every day | RECTAL | Status: DC
  Administered 2023-08-25 – 2023-08-31 (×7): 10 mg via RECTAL
  Filled 2023-08-25 (×7): qty 1

## 2023-08-25 MED ORDER — POLYVINYL ALCOHOL 1.4 % OP SOLN
1.0000 [drp] | OPHTHALMIC | Status: DC | PRN
  Filled 2023-08-25: qty 15

## 2023-08-25 MED ORDER — BISACODYL 10 MG RE SUPP: 10.0000 mg | Freq: Every day | RECTAL | Status: DC | PRN

## 2023-08-25 MED ORDER — SENNOSIDES-DOCUSATE SODIUM 8.6-50 MG PO TABS
2.0000 | ORAL_TABLET | Freq: Every day | ORAL | Status: DC
  Administered 2023-08-25 – 2023-08-31 (×7): 2 via ORAL
  Filled 2023-08-25 (×7): qty 2

## 2023-08-25 MED ORDER — MIRABEGRON ER 50 MG PO TB24
50.0000 mg | ORAL_TABLET | Freq: Every day | ORAL | Status: DC
  Administered 2023-08-25 – 2023-08-31 (×7): 50 mg via ORAL
  Filled 2023-08-25 (×7): qty 1

## 2023-08-25 MED ORDER — BACLOFEN 10 MG PO TABS
20.0000 mg | ORAL_TABLET | Freq: Every day | ORAL | Status: DC
  Administered 2023-08-25 – 2023-08-30 (×6): 20 mg via ORAL
  Filled 2023-08-25 (×6): qty 2
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
## 2023-08-25 NOTE — Hospital Course (Signed)
 PMH of BPH, nephrolithiasis, paraplegia with neurogenic bladder managed with CIC, type II DM, hypothyroidism. Recently was diagnosed with a UTI in March 2025. Scheduled for cystoscopy with ureteroscopy and lithotripsy on 08/22/2023.  Postprocedure the ureteral stent was placed in as well as Foley catheter. After going home on 5/3 patient started having fever and he was brought to the hospital.  Assessment and Plan: Sepsis secondary to pyelonephritis with Pseudomonas infection Hypotension. Secondary to Pseudomonas infection with bacteremia. Recently underwent procedure on 5/2 for stent placement as well as lithotripsy. Reportedly had a Tmax of 104 although not documented.  Documented Tmax is 102.6.  Had significant leukocytosis 20.6. Initial blood pressure was better but later on dropped down to 70/40.  Has been tachycardic on admission.  Treated aggressively with IV hydration. Initially was on IV cefepime .  Now on IV Merrem  Feeling better.  Mentation wise.  Close to baseline.  Blood pressure improving. Urine culture and blood cultures both growing Pseudomonas.  Now only sensitive to carbapenems. Follow duration of the antibiotic per urology.   Right-sided kidney stone. Underwent lithotripsy outpatient. Had a stent.  Inadvertently Foley catheter removed overnight.  Urology placed a Foley catheter on 5/4 again.  Removed on 5/5 along with the stent. KUB confirms that the stent may have moved down in the urethra. Management per urology.  Neurogenic bladder. Performs chronic intermittent catheterization at home.  Resume every 4 hours.   Possible fungal UTI. Outpatient urine culture also grew fungus. Currently on Diflucan  per recommendation from urology.   Chronic spasticity. On baclofen . Currently continuing.   Chronic complete paraplegia. Monitor. Air mattress ordered.   Type 2 diabetes mellitus, well-controlled. A1c 6.5.  Chronic constipation. Patient uses daily Dulcolax  suppository. Currently on sliding scale insulin .

## 2023-08-25 NOTE — Progress Notes (Addendum)
 Subjective: Patient doing very well this morning, mentation at baseline. Afebrile overnight, tolerating regular diet.   Objective: Vital signs in last 24 hours: Temp:  [97.9 F (36.6 C)-99.8 F (37.7 C)] 99.4 F (37.4 C) (05/05 0419) Pulse Rate:  [73-92] 80 (05/05 0419) Resp:  [17-19] 18 (05/05 0419) BP: (91-140)/(52-73) 140/73 (05/05 0419) SpO2:  [93 %-100 %] 93 % (05/05 0419)  Assessment/Plan: 89 y.o. male with a history of BPH, nephrolithiasis, paraplegia, neurogenic bladder managed with CIC, T2DM who underwent right URS/LL with stent placement on 5/2, presented with urosepsis on 5/3.  #Urosepsis, improving - leukocytosis downtrending, 14 from 20 - Ucx and Blood cx growing pseudomonas, on meropenem  - Preoperative culture grew yeast, on fluconazole   #Urolithiasis - Ureteral stent removed at bedside today - Cr at baseline 0.53  #Neurogenic bladder - Catheter removed, resume CIC q4h   Intake/Output from previous day: 05/04 0701 - 05/05 0700 In: 120 [P.O.:120] Out: 1025 [Urine:1025]  Intake/Output this shift: No intake/output data recorded.  Physical Exam:  General: Alert and oriented CV: No cyanosis Lungs: equal chest rise Abdomen: Soft, NTND, no rebound or guarding Gu: Foley and stent removed  Lab Results: Recent Labs    08/23/23 1203 08/24/23 0652 08/25/23 0542  HGB 15.1 12.1* 11.7*  HCT 46.6 36.1* 34.8*   BMET Recent Labs    08/24/23 0652 08/25/23 0542  NA 135 135  K 3.7 3.4*  CL 102 107  CO2 24 23  GLUCOSE 193* 133*  BUN 15 12  CREATININE 0.80 0.53*  CALCIUM  8.1* 7.9*     Studies/Results: DG Abd 1 View Result Date: 08/24/2023 CLINICAL DATA:  Ureteral stent placement.  Cyst EXAM: ABDOMEN - 1 VIEW COMPARISON:  02/11/2023. FINDINGS: The bowel gas pattern is normal. Several mm sized calcific densities overlie the right kidney consistent with nephrolithiasis. Right ureteral stent in place. Proximal loop overlies the expected location right  renal pelvis. Distal portion of stent appears to be within the urethra. IMPRESSION: Right nephrolithiasis. Distal right ureteral stent entering the urethra. Electronically Signed   By: Sydell Eva M.D.   On: 08/24/2023 09:02   CT ABDOMEN PELVIS W CONTRAST Result Date: 08/23/2023 CLINICAL DATA:  Abdominal pain. EXAM: CT ABDOMEN AND PELVIS WITH CONTRAST TECHNIQUE: Multidetector CT imaging of the abdomen and pelvis was performed using the standard protocol following bolus administration of intravenous contrast. RADIATION DOSE REDUCTION: This exam was performed according to the departmental dose-optimization program which includes automated exposure control, adjustment of the mA and/or kV according to patient size and/or use of iterative reconstruction technique. CONTRAST:  75mL OMNIPAQUE  IOHEXOL  350 MG/ML SOLN COMPARISON:  CT abdomen pelvis dated 07/20/2023. FINDINGS: Evaluation of this exam is limited due to respiratory motion. Lower chest: The visualized lung bases are clear. There is coronary vascular calcification. No intra-abdominal free air or free fluid. Hepatobiliary: Small liver cysts and additional subcentimeter hypodense lesions which are too small to characterize. No biliary dilatation. The gallbladder is unremarkable. Pancreas: Moderately atrophic. No dilatation of the main pancreatic duct. No active inflammation. A 12 mm hypodense lesion arising from the distal pancreas not characterized, likely a side branch IPMN. Attention on follow-up imaging recommended. Spleen: Normal in size without focal abnormality. Adrenals/Urinary Tract: The adrenal glands unremarkable. Right ureteral stent with pigtail tip in the upper pole collecting system. No hydronephrosis. Faint areas of hypoenhancement primarily in the interpolar right kidney most consistent with pyelonephritis. Correlation with urinalysis recommended. Small left renal upper pole cyst. There is no hydronephrosis  on the left. The urinary bladder is  decompressed around a Foley catheter. Stomach/Bowel: There is moderate stool throughout the colon. There is sigmoid diverticulosis without active inflammatory changes. There is no bowel obstruction or active inflammation. The appendix is normal. Vascular/Lymphatic: Mild aortoiliac atherosclerotic disease with the IVC is unremarkable. No portal venous gas. There is no adenopathy. Reproductive: The prostate and seminal vesicles are grossly unremarkable Other: None Musculoskeletal: Osteopenia with scoliosis and degenerative changes spine. Total right hip arthroplasty. No acute osseous pathology. IMPRESSION: 1. Right ureteral stent in place. No hydronephrosis. 2. Right pyelonephritis.  Correlation with urinalysis recommended. 3. Sigmoid diverticulosis. No bowel obstruction. Normal appendix. 4.  Aortic Atherosclerosis (ICD10-I70.0). Electronically Signed   By: Angus Bark M.D.   On: 08/23/2023 14:15   DG Chest Port 1 View Result Date: 08/23/2023 CLINICAL DATA:  Questionable sepsis.  Evaluate for abnormality. EXAM: PORTABLE CHEST 1 VIEW COMPARISON:  07/20/2023 FINDINGS: Heart size and mediastinal contours appear normal. No pleural effusion or interstitial edema. Subsegmental atelectasis within the periphery of the left base. No airspace consolidation. Status post posterior hardware fixation of the mid thoracic spine. IMPRESSION: Left base subsegmental atelectasis. Electronically Signed   By: Kimberley Penman M.D.   On: 08/23/2023 12:20      LOS: 2 days   Alphonza Ashing, MD [facility] Resident  [facility]   Dr. Freddi Jaeger is the attending of record for this consult.    08/25/2023, 7:14 AM   Stent and foley removed today. Patient and wife to resume CIC every 4 hours given history of neurogenic bladder. -Continue merrem  and fluconazole . Complete 1 week treatment upon discharge. -Appreciate medicine assistance  Matt R. Damany Eastman MD [facility]  Pager: [phone]

## 2023-08-26 DIAGNOSIS — A419 Sepsis, unspecified organism: Secondary | ICD-10-CM | POA: Diagnosis not present

## 2023-08-26 DIAGNOSIS — N390 Urinary tract infection, site not specified: Secondary | ICD-10-CM | POA: Diagnosis not present

## 2023-08-26 LAB — GLUCOSE, CAPILLARY
Glucose-Capillary: 186 mg/dL — ABNORMAL HIGH (ref 70–99)
Glucose-Capillary: 160 mg/dL — ABNORMAL HIGH (ref 70–99)
Glucose-Capillary: 134 mg/dL — ABNORMAL HIGH (ref 70–99)
Glucose-Capillary: 156 mg/dL — ABNORMAL HIGH (ref 70–99)

## 2023-08-26 LAB — BASIC METABOLIC PANEL WITH GFR
Anion gap: 10 (ref 5–15)
BUN: 11 mg/dL (ref 8–23)
CO2: 24 mmol/L (ref 22–32)
Calcium: 8.8 mg/dL — ABNORMAL LOW (ref 8.9–10.3)
Chloride: 102 mmol/L (ref 98–111)
Creatinine, Ser: 0.55 mg/dL — ABNORMAL LOW (ref 0.61–1.24)
GFR, Estimated: >60 mL/min (ref >60)
Glucose, Bld: 123 mg/dL — ABNORMAL HIGH (ref 70–99)
Potassium: 4.4 mmol/L (ref 3.5–5.1)
Sodium: 136 mmol/L (ref 135–145)

## 2023-08-26 LAB — CBC
HCT: 37.7 % — ABNORMAL LOW (ref 39.0–52.0)
Hemoglobin: 13 g/dL (ref 13.0–17.0)
MCH: 32.2 pg (ref 26.0–34.0)
MCHC: 34.5 g/dL (ref 30.0–36.0)
MCV: 93.3 fL (ref 80.0–100.0)
Platelets: 134 10*3/uL — ABNORMAL LOW (ref 150–400)
RBC: 4.04 MIL/uL — ABNORMAL LOW (ref 4.22–5.81)
RDW: 13.8 % (ref 11.5–15.5)
WBC: 11.1 10*3/uL — ABNORMAL HIGH (ref 4.0–10.5)
nRBC: 0 % (ref 0.0–0.2)

## 2023-08-26 LAB — CULTURE, BLOOD (ROUTINE X 2): Special Requests: ADEQUATE

## 2023-08-26 LAB — MAGNESIUM: Magnesium: 1.8 mg/dL (ref 1.7–2.4)

## 2023-08-26 NOTE — Progress Notes (Signed)
  Subjective: Denies pain. No nausea or emesis. Afebrile.  Objective: Vital signs in last 24 hours: Temp:  [97.7 F (36.5 C)-99.5 F (37.5 C)] 97.7 F (36.5 C) (05/06 1259) Pulse Rate:  [75-82] 75 (05/06 1259) Resp:  [18-19] 19 (05/06 1259) BP: (101-145)/(59-93) 128/85 (05/06 1259) SpO2:  [93 %-97 %] 93 % (05/06 1259) Weight:  [72 kg] 72 kg (05/05 1900)  Intake/Output from previous day: 05/05 0701 - 05/06 0700 In: -  Out: 2800 [Urine:2800] Intake/Output this shift: Total I/O In: 120 [P.O.:120] Out: 500 [Urine:500]  Physical Exam:  General: Alert and oriented CV: RRR Lungs: Clear Abdomen: Soft, ND, NT Ext: NT, No erythema  Lab Results: Recent Labs    08/24/23 0652 08/25/23 0542 08/26/23 0601  HGB 12.1* 11.7* 13.0  HCT 36.1* 34.8* 37.7*   BMET Recent Labs    08/25/23 0542 08/26/23 0601  NA 135 136  K 3.4* 4.4  CL 107 102  CO2 23 24  GLUCOSE 133* 123*  BUN 12 11  CREATININE 0.53* 0.55*  CALCIUM  7.9* 8.8*     Studies/Results: No results found.  Assessment/Plan: Urosepsis: 2. Neurogenic bladder:  -Ucx and Blood cx growing pseudomonas, on meropenem   -Continue broad spectrum antibiotics -Will require at least 2 week therapy upon discharge -Continue CIC -Following peripherally. Please call with questions.   LOS: 3 days   Matt R. Samy Ryner MD 08/26/2023, 4:08 PM [facility]  Pager: [phone]

## 2023-08-26 NOTE — Progress Notes (Signed)
 [facility] Progress Note Patient: Edward Waller [id number]:[id number] DOB: Oct 06, 1932 DOA: 08/23/2023  DOS: the patient was seen and examined on 08/26/2023  Brief Hospital Course: PMH of BPH, nephrolithiasis, paraplegia with neurogenic bladder managed with CIC, type II DM, hypothyroidism. Recently was diagnosed with a UTI in March 2025. Scheduled for cystoscopy with ureteroscopy and lithotripsy on 08/22/2023.  Postprocedure the ureteral stent was placed in as well as Foley catheter. After going home on 5/3 patient started having fever and he was brought to the hospital.  Assessment and Plan: Sepsis secondary to pyelonephritis with Pseudomonas infection Hypotension. Secondary to Pseudomonas infection with bacteremia. Recently underwent procedure on 5/2 for stent placement as well as lithotripsy. Reportedly had a Tmax of 104 although not documented.  Documented Tmax is 102.6.  Had significant leukocytosis 20.6. Initial blood pressure was better but later on dropped down to 70/40.  Has been tachycardic on admission.  Treated aggressively with IV hydration. Initially was on IV cefepime .  Now on IV Merrem  Feeling better.  Mentation wise.  Close to baseline.  Blood pressure improving. Urine culture and blood cultures both growing Pseudomonas.  Now only sensitive to carbapenems. Follow duration of the antibiotic per urology.   Right-sided kidney stone. Underwent lithotripsy outpatient. Had a stent.  Inadvertently Foley catheter removed overnight.  Urology placed a Foley catheter on 5/4 again.  Removed on 5/5 along with the stent. KUB confirms that the stent may have moved down in the urethra. Management per urology.  Neurogenic bladder. Performs chronic intermittent catheterization at home.  Resume every 4 hours.   Possible fungal UTI. Outpatient urine culture also grew fungus. Currently on Diflucan  per recommendation from urology.   Chronic spasticity. On baclofen . Currently continuing.    Chronic complete paraplegia. Monitor. Air mattress ordered.   Type 2 diabetes mellitus, well-controlled. A1c 6.5.  Chronic constipation. Patient uses daily Dulcolax suppository. Currently on sliding scale insulin .   Subjective: No nausea no vomiting no fever no chills no chest pain.  Physical Exam: General: in Mild distress, No Rash Cardiovascular: S1 and S2 Present, No Murmur Respiratory: Good respiratory effort, Bilateral Air entry present. No Crackles, No wheezes Abdomen: Bowel Sound present, No tenderness, abdomen somewhat distended. Extremities: No edema Neuro: Alert and oriented x3, no new focal deficit  Data Reviewed: I have Reviewed nursing notes, Vitals, and Lab results. Since last encounter, pertinent lab results CBC and BMP   . I have ordered test including CBC and BMP  .  Disposition: Status is: Inpatient Remains inpatient appropriate because: Needing IV antibiotic.  Depending on the duration recommended may need PICC line.  heparin injection 5,000 Units Start: 08/23/23 1545   Family Communication: Wife at bedside. Level of care: Telemetry Medical   Vitals:   08/26/23 0040 08/26/23 0835 08/26/23 1259 08/26/23 1715  BP: (!) 145/91 (!) 101/59 128/85 (!) 144/78  Pulse: 82 78 75 67  Resp:  18 19 19   Temp: 99 F (37.2 C) 98.6 F (37 C) 97.7 F (36.5 C) 98.7 F (37.1 C)  TempSrc: Oral     SpO2: 97% 93% 93% 97%  Weight:      Height:         Author: Charlean Congress, MD 08/26/2023 7:14 PM  Please look on [email or web address] to find out who is on call.

## 2023-08-26 NOTE — Plan of Care (Signed)
 Pt has sacral foam changed. Pt has been turned and reposition. Straight cath Q4 last one completed at 0600.  Problem: Education: Goal: Knowledge of General Education information will improve Description: Including pain rating scale, medication(s)/side effects and non-pharmacologic comfort measures Outcome: Progressing   Problem: Health Behavior/Discharge Planning: Goal: Ability to manage health-related needs will improve Outcome: Progressing   Problem: Clinical Measurements: Goal: Ability to maintain clinical measurements within normal limits will improve Outcome: Progressing Goal: Will remain free from infection Outcome: Progressing Goal: Diagnostic test results will improve Outcome: Progressing Goal: Respiratory complications will improve Outcome: Progressing Goal: Cardiovascular complication will be avoided Outcome: Progressing   Problem: Activity: Goal: Risk for activity intolerance will decrease Outcome: Progressing   Problem: Nutrition: Goal: Adequate nutrition will be maintained Outcome: Progressing   Problem: Coping: Goal: Level of anxiety will decrease Outcome: Progressing   Problem: Elimination: Goal: Will not experience complications related to bowel motility Outcome: Progressing Goal: Will not experience complications related to urinary retention Outcome: Progressing   Problem: Pain Managment: Goal: General experience of comfort will improve and/or be controlled Outcome: Progressing   Problem: Safety: Goal: Ability to remain free from injury will improve Outcome: Progressing   Problem: Skin Integrity: Goal: Risk for impaired skin integrity will decrease Outcome: Progressing   Problem: Education: Goal: Ability to describe self-care measures that may prevent or decrease complications (Diabetes Survival Skills Education) will improve Outcome: Progressing Goal: Individualized Educational Video(s) Outcome: Progressing   Problem: Coping: Goal: Ability  to adjust to condition or change in health will improve Outcome: Progressing   Problem: Fluid Volume: Goal: Ability to maintain a balanced intake and output will improve Outcome: Progressing   Problem: Health Behavior/Discharge Planning: Goal: Ability to identify and utilize available resources and services will improve Outcome: Progressing Goal: Ability to manage health-related needs will improve Outcome: Progressing   Problem: Metabolic: Goal: Ability to maintain appropriate glucose levels will improve Outcome: Progressing   Problem: Nutritional: Goal: Maintenance of adequate nutrition will improve Outcome: Progressing Goal: Progress toward achieving an optimal weight will improve Outcome: Progressing   Problem: Skin Integrity: Goal: Risk for impaired skin integrity will decrease Outcome: Progressing   Problem: Tissue Perfusion: Goal: Adequacy of tissue perfusion will improve Outcome: Progressing

## 2023-08-27 DIAGNOSIS — N319 Neuromuscular dysfunction of bladder, unspecified: Secondary | ICD-10-CM

## 2023-08-27 DIAGNOSIS — N201 Calculus of ureter: Secondary | ICD-10-CM | POA: Diagnosis not present

## 2023-08-27 DIAGNOSIS — G8221 Paraplegia, complete: Secondary | ICD-10-CM | POA: Diagnosis not present

## 2023-08-27 DIAGNOSIS — R7881 Bacteremia: Secondary | ICD-10-CM | POA: Diagnosis not present

## 2023-08-27 DIAGNOSIS — B965 Pseudomonas (aeruginosa) (mallei) (pseudomallei) as the cause of diseases classified elsewhere: Secondary | ICD-10-CM

## 2023-08-27 DIAGNOSIS — R6520 Severe sepsis without septic shock: Secondary | ICD-10-CM

## 2023-08-27 LAB — GLUCOSE, CAPILLARY
Glucose-Capillary: 167 mg/dL — ABNORMAL HIGH (ref 70–99)
Glucose-Capillary: 109 mg/dL — ABNORMAL HIGH (ref 70–99)
Glucose-Capillary: 136 mg/dL — ABNORMAL HIGH (ref 70–99)
Glucose-Capillary: 190 mg/dL — ABNORMAL HIGH (ref 70–99)

## 2023-08-27 LAB — CBC
HCT: 37.5 % — ABNORMAL LOW (ref 39.0–52.0)
Hemoglobin: 12.9 g/dL — ABNORMAL LOW (ref 13.0–17.0)
MCH: 32.1 pg (ref 26.0–34.0)
MCHC: 34.4 g/dL (ref 30.0–36.0)
MCV: 93.3 fL (ref 80.0–100.0)
Platelets: 196 10*3/uL (ref 150–400)
RBC: 4.02 MIL/uL — ABNORMAL LOW (ref 4.22–5.81)
RDW: 13.6 % (ref 11.5–15.5)
WBC: 8.3 10*3/uL (ref 4.0–10.5)
nRBC: 0 % (ref 0.0–0.2)

## 2023-08-27 LAB — BASIC METABOLIC PANEL WITH GFR
Anion gap: 9 (ref 5–15)
BUN: 13 mg/dL (ref 8–23)
CO2: 24 mmol/L (ref 22–32)
Calcium: 8.8 mg/dL — ABNORMAL LOW (ref 8.9–10.3)
Chloride: 105 mmol/L (ref 98–111)
Creatinine, Ser: 0.62 mg/dL (ref 0.61–1.24)
GFR, Estimated: >60 mL/min (ref >60)
Glucose, Bld: 131 mg/dL — ABNORMAL HIGH (ref 70–99)
Potassium: 4.4 mmol/L (ref 3.5–5.1)
Sodium: 138 mmol/L (ref 135–145)

## 2023-08-27 LAB — MAGNESIUM: Magnesium: 2 mg/dL (ref 1.7–2.4)

## 2023-08-27 MED ORDER — FLUCONAZOLE 200 MG PO TABS
400.0000 mg | ORAL_TABLET | Freq: Every day | ORAL | Status: DC
  Administered 2023-08-28 – 2023-08-31 (×4): 400 mg via ORAL
  Filled 2023-08-27 (×4): qty 2

## 2023-08-27 NOTE — Assessment & Plan Note (Addendum)
-   Febrile, leukocytosis, elevated lactic; presumed urinary source; developed fever after cystoscopy on 5/2 - continue abx, see bacteremia - pre-op UA noted with yeast per urology; has been treated with fluconazole ; asymptomatic but will treat with fluconazole  for duration of course given urologic procedure

## 2023-08-27 NOTE — Plan of Care (Signed)

## 2023-08-27 NOTE — Assessment & Plan Note (Signed)
On Myrbetriq
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
## 2023-08-27 NOTE — Assessment & Plan Note (Addendum)
-   due to paraplegia as noted above - undergoes CIC at home via help from wife; does seem that his wife is a little overwhelmed with the amount of caths plus in the middle of the night - Discussed with urology and also agrees that suprapubic cath would be appropriate after resolution of infection and they will discuss further at outpatient follow-up -On Vesicare and Myrbetriq  at home - Foley catheter used during remainder of hospitalization for simplicity and removed at discharge.  CIC to be resumed at discharge per wife - Outpatient follow-up with urology

## 2023-08-27 NOTE — Assessment & Plan Note (Signed)
 Continue Synthroid

## 2023-08-27 NOTE — Assessment & Plan Note (Signed)
-   On baclofen at home

## 2023-08-27 NOTE — Plan of Care (Signed)
 Assumed care at 1900. Pt has been resting in dolphin bed overnight. Pt has been soaking through lots of towels overnight, it doesn't appear to be dribbling from his penis but more urinary incontinence with incomplete bladder emptying. Straight cath has been Q4hrs with approx output each time. No complaints of pain overnight. See MAR.    Problem: Education: Goal: Knowledge of General Education information will improve Description: Including pain rating scale, medication(s)/side effects and non-pharmacologic comfort measures Outcome: Progressing   Problem: Health Behavior/Discharge Planning: Goal: Ability to manage health-related needs will improve Outcome: Progressing   Problem: Clinical Measurements: Goal: Ability to maintain clinical measurements within normal limits will improve Outcome: Progressing Goal: Will remain free from infection Outcome: Progressing Goal: Diagnostic test results will improve Outcome: Progressing Goal: Respiratory complications will improve Outcome: Progressing Goal: Cardiovascular complication will be avoided Outcome: Progressing   Problem: Activity: Goal: Risk for activity intolerance will decrease Outcome: Progressing   Problem: Nutrition: Goal: Adequate nutrition will be maintained Outcome: Progressing   Problem: Coping: Goal: Level of anxiety will decrease Outcome: Progressing   Problem: Elimination: Goal: Will not experience complications related to bowel motility Outcome: Progressing Goal: Will not experience complications related to urinary retention Outcome: Progressing   Problem: Pain Managment: Goal: General experience of comfort will improve and/or be controlled Outcome: Progressing   Problem: Safety: Goal: Ability to remain free from injury will improve Outcome: Progressing   Problem: Skin Integrity: Goal: Risk for impaired skin integrity will decrease Outcome: Progressing   Problem: Education: Goal: Ability to  describe self-care measures that may prevent or decrease complications (Diabetes Survival Skills Education) will improve Outcome: Progressing Goal: Individualized Educational Video(s) Outcome: Progressing   Problem: Coping: Goal: Ability to adjust to condition or change in health will improve Outcome: Progressing   Problem: Fluid Volume: Goal: Ability to maintain a balanced intake and output will improve Outcome: Progressing   Problem: Health Behavior/Discharge Planning: Goal: Ability to identify and utilize available resources and services will improve Outcome: Progressing Goal: Ability to manage health-related needs will improve Outcome: Progressing   Problem: Metabolic: Goal: Ability to maintain appropriate glucose levels will improve Outcome: Progressing   Problem: Nutritional: Goal: Maintenance of adequate nutrition will improve Outcome: Progressing Goal: Progress toward achieving an optimal weight will improve Outcome: Progressing   Problem: Skin Integrity: Goal: Risk for impaired skin integrity will decrease Outcome: Progressing   Problem: Tissue Perfusion: Goal: Adequacy of tissue perfusion will improve Outcome: Progressing
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
## 2023-08-27 NOTE — Assessment & Plan Note (Addendum)
-   Last A1c 6.5% on 07/21/2023 - Continue diet control at discharge

## 2023-08-27 NOTE — Assessment & Plan Note (Addendum)
 History of right peroneal DVT on 01/11/2023 - Does not appear to be on Eliquis  anymore

## 2023-08-27 NOTE — Assessment & Plan Note (Signed)
-   s/p laser lithotripsy and basket extraction of stones followed by retrograde pyelogram and ureteral stent placement - Ureteral stent removed by urology bedside on 08/25/2023

## 2023-08-27 NOTE — Assessment & Plan Note (Addendum)
-   Suspected translocation from urinary source similar to March hospitalization as well - Remain concerned of frequent caths at home placing higher risk for infection along with burnout being caused with his elderly wife -1/4 bottles positive with Pseudomonas on admission from 08/23/2023; higher resistance pattern plan prior organism -Planning for 7-day course of meropenem  inpatient; completed on 08/31/2023 - Repeat blood cultures for clearance; remaining negative from 08/27/2023

## 2023-08-28 ENCOUNTER — Other Ambulatory Visit (HOSPITAL_COMMUNITY): Payer: Self-pay

## 2023-08-28 DIAGNOSIS — N201 Calculus of ureter: Secondary | ICD-10-CM | POA: Diagnosis not present

## 2023-08-28 DIAGNOSIS — R7881 Bacteremia: Secondary | ICD-10-CM | POA: Diagnosis not present

## 2023-08-28 DIAGNOSIS — B965 Pseudomonas (aeruginosa) (mallei) (pseudomallei) as the cause of diseases classified elsewhere: Secondary | ICD-10-CM | POA: Diagnosis not present

## 2023-08-28 DIAGNOSIS — R6520 Severe sepsis without septic shock: Secondary | ICD-10-CM | POA: Diagnosis not present

## 2023-08-28 LAB — GLUCOSE, CAPILLARY
Glucose-Capillary: 133 mg/dL — ABNORMAL HIGH (ref 70–99)
Glucose-Capillary: 138 mg/dL — ABNORMAL HIGH (ref 70–99)
Glucose-Capillary: 127 mg/dL — ABNORMAL HIGH (ref 70–99)

## 2023-08-28 LAB — CULTURE, BLOOD (ROUTINE X 2)
Culture: NO GROWTH
Special Requests: ADEQUATE

## 2023-08-28 MED ORDER — INSULIN ASPART 100 UNIT/ML IJ SOLN
0.0000 [IU] | Freq: Two times a day (BID) | INTRAMUSCULAR | Status: DC
  Administered 2023-08-28: 2 [IU] via SUBCUTANEOUS
  Administered 2023-08-29 (×2): 1 [IU] via SUBCUTANEOUS

## 2023-08-28 MED ORDER — OXYCODONE HCL 5 MG PO TABS
5.0000 mg | ORAL_TABLET | ORAL | Status: DC | PRN
  Administered 2023-08-28: 5 mg via ORAL
  Filled 2023-08-28: qty 1

## 2023-08-28 NOTE — Plan of Care (Signed)
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
## 2023-08-28 NOTE — Plan of Care (Signed)
 Assumed care at 1900. Pt has been resting comfortably in bed overnight. No complaints of pain overnight. Pt has been repositioned in bed. Foley intact with good output. BM overnight. No significant events at this time.    Problem: Education: Goal: Knowledge of General Education information will improve Description: Including pain rating scale, medication(s)/side effects and non-pharmacologic comfort measures Outcome: Progressing   Problem: Health Behavior/Discharge Planning: Goal: Ability to manage health-related needs will improve Outcome: Progressing   Problem: Clinical Measurements: Goal: Ability to maintain clinical measurements within normal limits will improve Outcome: Progressing Goal: Will remain free from infection Outcome: Progressing Goal: Diagnostic test results will improve Outcome: Progressing Goal: Respiratory complications will improve Outcome: Progressing Goal: Cardiovascular complication will be avoided Outcome: Progressing   Problem: Activity: Goal: Risk for activity intolerance will decrease Outcome: Progressing   Problem: Nutrition: Goal: Adequate nutrition will be maintained Outcome: Progressing   Problem: Coping: Goal: Level of anxiety will decrease Outcome: Progressing   Problem: Elimination: Goal: Will not experience complications related to bowel motility Outcome: Progressing Goal: Will not experience complications related to urinary retention Outcome: Progressing   Problem: Pain Managment: Goal: General experience of comfort will improve and/or be controlled Outcome: Progressing   Problem: Safety: Goal: Ability to remain free from injury will improve Outcome: Progressing   Problem: Skin Integrity: Goal: Risk for impaired skin integrity will decrease Outcome: Progressing   Problem: Education: Goal: Ability to describe self-care measures that may prevent or decrease complications (Diabetes Survival Skills Education) will  improve Outcome: Progressing Goal: Individualized Educational Video(s) Outcome: Progressing   Problem: Coping: Goal: Ability to adjust to condition or change in health will improve Outcome: Progressing   Problem: Fluid Volume: Goal: Ability to maintain a balanced intake and output will improve Outcome: Progressing   Problem: Health Behavior/Discharge Planning: Goal: Ability to identify and utilize available resources and services will improve Outcome: Progressing Goal: Ability to manage health-related needs will improve Outcome: Progressing   Problem: Metabolic: Goal: Ability to maintain appropriate glucose levels will improve Outcome: Progressing   Problem: Nutritional: Goal: Maintenance of adequate nutrition will improve Outcome: Progressing Goal: Progress toward achieving an optimal weight will improve Outcome: Progressing   Problem: Skin Integrity: Goal: Risk for impaired skin integrity will decrease Outcome: Progressing   Problem: Tissue Perfusion: Goal: Adequacy of tissue perfusion will improve Outcome: Progressing

## 2023-08-29 DIAGNOSIS — R7881 Bacteremia: Secondary | ICD-10-CM | POA: Diagnosis not present

## 2023-08-29 DIAGNOSIS — G8221 Paraplegia, complete: Secondary | ICD-10-CM | POA: Diagnosis not present

## 2023-08-29 DIAGNOSIS — R6520 Severe sepsis without septic shock: Secondary | ICD-10-CM | POA: Diagnosis not present

## 2023-08-29 DIAGNOSIS — N319 Neuromuscular dysfunction of bladder, unspecified: Secondary | ICD-10-CM | POA: Diagnosis not present

## 2023-08-29 LAB — GLUCOSE, CAPILLARY
Glucose-Capillary: 131 mg/dL — ABNORMAL HIGH (ref 70–99)
Glucose-Capillary: 127 mg/dL — ABNORMAL HIGH (ref 70–99)
Glucose-Capillary: 133 mg/dL — ABNORMAL HIGH (ref 70–99)
Glucose-Capillary: 143 mg/dL — ABNORMAL HIGH (ref 70–99)
Glucose-Capillary: 173 mg/dL — ABNORMAL HIGH (ref 70–99)

## 2023-08-29 MED ORDER — INSULIN ASPART 100 UNIT/ML IJ SOLN: 0.0000 [IU] | Freq: Two times a day (BID) | INTRAMUSCULAR | Status: DC
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
## 2023-08-29 NOTE — Plan of Care (Signed)

## 2023-08-29 NOTE — Progress Notes (Signed)
     Subjective:  No acute events overnight.  Patient was resting on the bed.  Reviewed removing Foley catheter at home.  His wife is a retired Engineer, civil (consulting) and has been handling his CIC for him.  All questions were answered to his satisfaction.  Objective: Vital signs in last 24 hours: Temp:  [98 F (36.7 C)-99.5 F (37.5 C)] 98 F (36.7 C) (05/09 0808) Pulse Rate:  [71-78] 75 (05/09 0808) Resp:  [12-18] 16 (05/09 0458) BP: (106-153)/(68-86) 125/70 (05/09 0808) SpO2:  [93 %-98 %] 93 % (05/09 9562)  Assessment/Plan: # Ureteral stone S/p stent and stone extraction with Dr. Aden Agreste on 08/22/2023. Foley catheter and stent removed on 08/25/2023 Foley with placed 08/27/2023.  Remove on discharge.  Patient to resume CIC once home.  # Pseudomonal UTI and bacteremia Recommend at least 2 weeks total treatment on discharge  Will sign off at this time.  Please feel free to call with questions or concerns  Intake/Output from previous day: 05/08 0701 - 05/09 0700 In: 240 [P.O.:240] Out: 4050 [Urine:4050]  Intake/Output this shift: No intake/output data recorded.  Physical Exam:  General: Alert and oriented CV: No cyanosis Lungs: equal chest rise Gu: Foley catheter in place draining clear yellow urine  Lab Results: Recent Labs    08/27/23 0550  HGB 12.9*  HCT 37.5*   BMET Recent Labs    08/27/23 0550  NA 138  K 4.4  CL 105  CO2 24  GLUCOSE 131*  BUN 13  CREATININE 0.62  CALCIUM  8.8*  HGB 12.9*  WBC 8.3     Studies/Results: No results found.    LOS: 6 days   Alla Ar, NP [facility] Pager: [phone]  08/29/2023, 8:13 AM

## 2023-08-29 NOTE — Anesthesia Postprocedure Evaluation (Signed)
 Anesthesia Post Note  Patient: Edward Waller  Procedure(s) Performed: CYSTOSCOPY/URETEROSCOPY/HOLMIUM LASER/STENT PLACEMENT (Right: Ureter)     Patient location during evaluation: PACU Anesthesia Type: General Level of consciousness: awake and alert Pain management: pain level controlled Vital Signs Assessment: post-procedure vital signs reviewed and stable Respiratory status: spontaneous breathing, nonlabored ventilation and respiratory function stable Cardiovascular status: blood pressure returned to baseline and stable Postop Assessment: no apparent nausea or vomiting Anesthetic complications: no   No notable events documented.  Last Vitals:  Vitals:   08/22/23 1430 08/22/23 1445  BP:  (!) 164/93  Pulse:    Resp:    Temp: 36.7 C   SpO2:      Last Pain:  Vitals:   08/22/23 1445  TempSrc:   PainSc: 0-No pain                 Jan Walters

## 2023-08-30 DIAGNOSIS — R6520 Severe sepsis without septic shock: Secondary | ICD-10-CM | POA: Diagnosis not present

## 2023-08-30 DIAGNOSIS — R7881 Bacteremia: Secondary | ICD-10-CM | POA: Diagnosis not present

## 2023-08-30 DIAGNOSIS — B965 Pseudomonas (aeruginosa) (mallei) (pseudomallei) as the cause of diseases classified elsewhere: Secondary | ICD-10-CM | POA: Diagnosis not present

## 2023-08-30 LAB — GLUCOSE, CAPILLARY
Glucose-Capillary: 114 mg/dL — ABNORMAL HIGH (ref 70–99)
Glucose-Capillary: 132 mg/dL — ABNORMAL HIGH (ref 70–99)
Glucose-Capillary: 109 mg/dL — ABNORMAL HIGH (ref 70–99)
Glucose-Capillary: 188 mg/dL — ABNORMAL HIGH (ref 70–99)
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
## 2023-08-30 NOTE — Plan of Care (Signed)

## 2023-08-30 NOTE — Plan of Care (Signed)
 Patient stable, no events. Home tomorrow Problem: Education: Goal: Knowledge of General Education information will improve Description: Including pain rating scale, medication(s)/side effects and non-pharmacologic comfort measures Outcome: Progressing   Problem: Health Behavior/Discharge Planning: Goal: Ability to manage health-related needs will improve Outcome: Progressing   Problem: Clinical Measurements: Goal: Ability to maintain clinical measurements within normal limits will improve Outcome: Progressing Goal: Will remain free from infection Outcome: Progressing Goal: Diagnostic test results will improve Outcome: Progressing Goal: Respiratory complications will improve Outcome: Progressing Goal: Cardiovascular complication will be avoided Outcome: Progressing   Problem: Activity: Goal: Risk for activity intolerance will decrease Outcome: Progressing   Problem: Nutrition: Goal: Adequate nutrition will be maintained Outcome: Progressing   Problem: Coping: Goal: Level of anxiety will decrease Outcome: Progressing   Problem: Elimination: Goal: Will not experience complications related to bowel motility Outcome: Progressing Goal: Will not experience complications related to urinary retention Outcome: Progressing   Problem: Pain Managment: Goal: General experience of comfort will improve and/or be controlled Outcome: Progressing   Problem: Safety: Goal: Ability to remain free from injury will improve Outcome: Progressing   Problem: Skin Integrity: Goal: Risk for impaired skin integrity will decrease Outcome: Progressing   Problem: Education: Goal: Ability to describe self-care measures that may prevent or decrease complications (Diabetes Survival Skills Education) will improve Outcome: Progressing Goal: Individualized Educational Video(s) Outcome: Progressing   Problem: Coping: Goal: Ability to adjust to condition or change in health will improve Outcome:  Progressing   Problem: Fluid Volume: Goal: Ability to maintain a balanced intake and output will improve Outcome: Progressing   Problem: Health Behavior/Discharge Planning: Goal: Ability to identify and utilize available resources and services will improve Outcome: Progressing Goal: Ability to manage health-related needs will improve Outcome: Progressing   Problem: Metabolic: Goal: Ability to maintain appropriate glucose levels will improve Outcome: Progressing   Problem: Nutritional: Goal: Maintenance of adequate nutrition will improve Outcome: Progressing Goal: Progress toward achieving an optimal weight will improve Outcome: Progressing   Problem: Skin Integrity: Goal: Risk for impaired skin integrity will decrease Outcome: Progressing   Problem: Tissue Perfusion: Goal: Adequacy of tissue perfusion will improve Outcome: Progressing

## 2023-08-31 DIAGNOSIS — G8221 Paraplegia, complete: Secondary | ICD-10-CM | POA: Diagnosis not present

## 2023-08-31 DIAGNOSIS — N319 Neuromuscular dysfunction of bladder, unspecified: Secondary | ICD-10-CM | POA: Diagnosis not present

## 2023-08-31 DIAGNOSIS — N201 Calculus of ureter: Secondary | ICD-10-CM | POA: Diagnosis not present

## 2023-08-31 DIAGNOSIS — R7881 Bacteremia: Secondary | ICD-10-CM | POA: Diagnosis not present

## 2023-08-31 LAB — GLUCOSE, CAPILLARY
Glucose-Capillary: 132 mg/dL — ABNORMAL HIGH (ref 70–99)
Glucose-Capillary: 125 mg/dL — ABNORMAL HIGH (ref 70–99)

## 2023-08-31 MED ORDER — METOPROLOL SUCCINATE ER 25 MG PO TB24: 12.5000 mg | ORAL_TABLET | Freq: Every day | ORAL | 3 refills | Status: DC

## 2023-08-31 NOTE — Discharge Summary (Signed)
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   Report Status 08/25/2023 FINAL  Final   Organism ID, Bacteria PSEUDOMONAS AERUGINOSA (A)  Final      Susceptibility   Pseudomonas aeruginosa - MIC*    CEFTAZIDIME 16 INTERMEDIATE Intermediate     CIPROFLOXACIN  >=4 RESISTANT Resistant     GENTAMICIN >=16 RESISTANT Resistant     IMIPENEM 2 SENSITIVE Sensitive     * 20,000 COLONIES/mL PSEUDOMONAS AERUGINOSA  Culture, blood (Routine X 2) w  Reflex to ID Panel     Status: None (Preliminary result)   Collection Time: 08/27/23  4:40 PM   Specimen: BLOOD  Result Value Ref Range Status   Specimen Description BLOOD SITE NOT SPECIFIED  Final   Special Requests   Final    BOTTLES DRAWN AEROBIC AND ANAEROBIC Blood Culture adequate volume   Culture   Final    NO GROWTH 4 DAYS Performed at [facility], [address], [place], [place] [address]    Report Status PENDING  Incomplete  Culture, blood (Routine X 2) w Reflex to ID Panel     Status: None (Preliminary result)   Collection Time: 08/27/23  4:58 PM   Specimen: BLOOD  Result Value Ref Range Status   Specimen Description BLOOD SITE NOT SPECIFIED  Final   Special Requests   Final    BOTTLES DRAWN AEROBIC AND ANAEROBIC Blood Culture adequate volume   Culture   Final    NO GROWTH 4 DAYS Performed at [facility], [address], [place], [place] [address]    Report Status PENDING  Incomplete    Procedures/Studies: DG Abd 1 View Result Date: 08/24/2023 CLINICAL DATA:  Ureteral stent placement.  Cyst EXAM: ABDOMEN - 1 VIEW COMPARISON:  02/11/2023. FINDINGS: The bowel gas pattern is normal. Several mm sized calcific densities overlie the right kidney consistent with nephrolithiasis. Right ureteral stent in place. Proximal loop overlies the expected location right renal pelvis. Distal portion of stent appears to be within the urethra. IMPRESSION: Right nephrolithiasis. Distal right ureteral stent entering the urethra. Electronically Signed   By: Sydell Eva M.D.   On: 08/24/2023 09:02   CT ABDOMEN PELVIS W CONTRAST Result Date: 08/23/2023 CLINICAL DATA:  Abdominal pain. EXAM: CT ABDOMEN AND PELVIS WITH CONTRAST TECHNIQUE:  Multidetector CT imaging of the abdomen and pelvis was performed using the standard protocol following bolus administration of intravenous contrast. RADIATION DOSE REDUCTION: This exam was performed according to the departmental dose-optimization  program which includes automated exposure control, adjustment of the mA and/or kV according to patient size and/or use of iterative reconstruction technique. CONTRAST:  75mL OMNIPAQUE  IOHEXOL  350 MG/ML SOLN COMPARISON:  CT abdomen pelvis dated 07/20/2023. FINDINGS: Evaluation of this exam is limited due to respiratory motion. Lower chest: The visualized lung bases are clear. There is coronary vascular calcification. No intra-abdominal free air or free fluid. Hepatobiliary: Small liver cysts and additional subcentimeter hypodense lesions which are too small to characterize. No biliary dilatation. The gallbladder is unremarkable. Pancreas: Moderately atrophic. No dilatation of the main pancreatic duct. No active inflammation. A 12 mm hypodense lesion arising from the distal pancreas not characterized, likely a side branch IPMN. Attention on follow-up imaging recommended. Spleen: Normal in size without focal abnormality. Adrenals/Urinary Tract: The adrenal glands unremarkable. Right ureteral stent with pigtail tip in the upper pole collecting system. No hydronephrosis. Faint areas of hypoenhancement primarily in the interpolar right kidney most consistent with pyelonephritis. Correlation with urinalysis recommended. Small left renal upper pole cyst. There is no hydronephrosis on the left. The urinary bladder is decompressed around a Foley catheter. Stomach/Bowel: There is moderate stool throughout the colon. There is sigmoid diverticulosis without active inflammatory changes. There is no bowel obstruction or active inflammation. The appendix is normal. Vascular/Lymphatic: Mild aortoiliac atherosclerotic disease with the IVC is unremarkable. No portal venous gas. There is no adenopathy. Reproductive: The prostate and seminal vesicles are grossly unremarkable Other: None Musculoskeletal: Osteopenia with scoliosis and degenerative changes spine. Total right hip arthroplasty. No acute osseous pathology. IMPRESSION: 1.  Right ureteral stent in place. No hydronephrosis. 2. Right pyelonephritis.  Correlation with urinalysis recommended. 3. Sigmoid diverticulosis. No bowel obstruction. Normal appendix. 4.  Aortic Atherosclerosis (ICD10-I70.0). Electronically Signed   By: Angus Bark M.D.   On: 08/23/2023 14:15   DG Chest Port 1 View Result Date: 08/23/2023 CLINICAL DATA:  Questionable sepsis.  Evaluate for abnormality. EXAM: PORTABLE CHEST 1 VIEW COMPARISON:  07/20/2023 FINDINGS: Heart size and mediastinal contours appear normal. No pleural effusion or interstitial edema. Subsegmental atelectasis within the periphery of the left base. No airspace consolidation. Status post posterior hardware fixation of the mid thoracic spine. IMPRESSION: Left base subsegmental atelectasis. Electronically Signed   By: Kimberley Penman M.D.   On: 08/23/2023 12:20   DG C-Arm 1-60 Min Result Date: 08/22/2023 CLINICAL DATA:  Cystoscopy, ureteroscopy, laser, stent placement EXAM: DG C-ARM 1-60 MIN FLUOROSCOPY: Fluoroscopy Time:  23.1 seconds Radiation Exposure Index (if provided by the fluoroscopic device): 3.92 mGy Number of Acquired Spot Images: 0 COMPARISON:  CT 07/20/2023 FINDINGS: Single intraoperative spot image demonstrates contrast within dilated right renal calices. Right ureteral stent is in place. The distal aspect of the stent not visualized. IMPRESSION: Placement of right ureteral stent. Electronically Signed   By: Janeece Mechanic M.D.   On: 08/22/2023 17:22     Time coordinating discharge: Over 30 minutes    Faith Homes, MD  [facility] 08/31/2023, 2:34 PM

## 2023-08-31 NOTE — TOC Transition Note (Signed)
 Transition of Care [facility]) - Discharge Note   Patient Details  Name: HTOO KANIECKI MRN: [id number] Date of Birth: July 12, 1932  Transition of Care [facility]) CM/SW Contact:  Omie Bickers, RN Phone Number: 08/31/2023, 3:09 PM   Clinical Narrative:     Eldora Greet w wife over the phone, she is agreeable to DC and has what she needs at home to continue care.  PTAR forms to unit.  PTAR called for pickup no earlier than 4pm .   Final next level of care: Home/Self Care Barriers to Discharge: No Barriers Identified   Patient Goals and CMS Choice            Discharge Placement                       Discharge Plan and Services Additional resources added to the After Visit Summary for                                       Social Drivers of Health (SDOH) Interventions SDOH Screenings   Food Insecurity: No Food Insecurity (08/23/2023)  Housing: Low Risk  (08/23/2023)  Transportation Needs: No Transportation Needs (08/23/2023)  Utilities: Not At Risk (08/23/2023)  Depression (PHQ2-9): Low Risk  (05/16/2020)  Social Connections: Socially Integrated (08/23/2023)  Tobacco Use: Low Risk  (08/22/2023)     Readmission Risk Interventions    07/22/2023    2:34 PM  Readmission Risk Prevention Plan  Transportation Screening Complete  PCP or Specialist Appt within 3-5 Days Complete  HRI or Home Care Consult Complete  Social Work Consult for Recovery Care Planning/Counseling Complete  Palliative Care Screening Not Applicable  Medication Review Oceanographer) Complete

## 2023-08-31 NOTE — Plan of Care (Signed)

## 2023-09-01 LAB — CULTURE, BLOOD (ROUTINE X 2)
Culture: NO GROWTH
Culture: NO GROWTH
Special Requests: ADEQUATE
Special Requests: ADEQUATE

## 2023-09-10 ENCOUNTER — Emergency Department (HOSPITAL_COMMUNITY)

## 2023-09-10 ENCOUNTER — Inpatient Hospital Stay (HOSPITAL_COMMUNITY)
Admission: EM | Admit: 2023-09-10 | Discharge: 2023-09-22 | DRG: 698 | Disposition: A | Discharge status: Skilled Nursing Facility | Attending: Internal Medicine | Admitting: Internal Medicine

## 2023-09-10 ENCOUNTER — Other Ambulatory Visit: Payer: Self-pay

## 2023-09-10 DIAGNOSIS — Z79899 Other long term (current) drug therapy: Secondary | ICD-10-CM

## 2023-09-10 DIAGNOSIS — H6121 Impacted cerumen, right ear: Secondary | ICD-10-CM | POA: Insufficient documentation

## 2023-09-10 DIAGNOSIS — Z7982 Long term (current) use of aspirin: Secondary | ICD-10-CM

## 2023-09-10 DIAGNOSIS — N319 Neuromuscular dysfunction of bladder, unspecified: Secondary | ICD-10-CM | POA: Diagnosis present

## 2023-09-10 DIAGNOSIS — N39490 Overflow incontinence: Secondary | ICD-10-CM | POA: Diagnosis present

## 2023-09-10 DIAGNOSIS — E039 Hypothyroidism, unspecified: Secondary | ICD-10-CM | POA: Diagnosis present

## 2023-09-10 DIAGNOSIS — Z85828 Personal history of other malignant neoplasm of skin: Secondary | ICD-10-CM

## 2023-09-10 DIAGNOSIS — E038 Other specified hypothyroidism: Secondary | ICD-10-CM | POA: Diagnosis not present

## 2023-09-10 DIAGNOSIS — Z79890 Hormone replacement therapy (postmenopausal): Secondary | ICD-10-CM

## 2023-09-10 DIAGNOSIS — G8222 Paraplegia, incomplete: Secondary | ICD-10-CM | POA: Diagnosis present

## 2023-09-10 DIAGNOSIS — E119 Type 2 diabetes mellitus without complications: Secondary | ICD-10-CM | POA: Diagnosis present

## 2023-09-10 DIAGNOSIS — I808 Phlebitis and thrombophlebitis of other sites: Secondary | ICD-10-CM | POA: Diagnosis not present

## 2023-09-10 DIAGNOSIS — G8221 Paraplegia, complete: Secondary | ICD-10-CM | POA: Diagnosis present

## 2023-09-10 DIAGNOSIS — A4152 Sepsis due to Pseudomonas: Secondary | ICD-10-CM | POA: Diagnosis present

## 2023-09-10 DIAGNOSIS — T83511A Infection and inflammatory reaction due to indwelling urethral catheter, initial encounter: Principal | ICD-10-CM | POA: Diagnosis present

## 2023-09-10 DIAGNOSIS — Z1624 Resistance to multiple antibiotics: Secondary | ICD-10-CM | POA: Diagnosis present

## 2023-09-10 DIAGNOSIS — I10 Essential (primary) hypertension: Secondary | ICD-10-CM | POA: Diagnosis present

## 2023-09-10 DIAGNOSIS — R54 Age-related physical debility: Secondary | ICD-10-CM | POA: Diagnosis present

## 2023-09-10 DIAGNOSIS — N200 Calculus of kidney: Secondary | ICD-10-CM | POA: Diagnosis present

## 2023-09-10 DIAGNOSIS — Z87442 Personal history of urinary calculi: Secondary | ICD-10-CM

## 2023-09-10 DIAGNOSIS — Z87440 Personal history of urinary (tract) infections: Secondary | ICD-10-CM

## 2023-09-10 DIAGNOSIS — Y846 Urinary catheterization as the cause of abnormal reaction of the patient, or of later complication, without mention of misadventure at the time of the procedure: Secondary | ICD-10-CM | POA: Diagnosis present

## 2023-09-10 DIAGNOSIS — R7881 Bacteremia: Secondary | ICD-10-CM | POA: Diagnosis not present

## 2023-09-10 DIAGNOSIS — E872 Acidosis, unspecified: Secondary | ICD-10-CM | POA: Diagnosis present

## 2023-09-10 DIAGNOSIS — A419 Sepsis, unspecified organism: Secondary | ICD-10-CM | POA: Diagnosis present

## 2023-09-10 DIAGNOSIS — Z96641 Presence of right artificial hip joint: Secondary | ICD-10-CM | POA: Diagnosis present

## 2023-09-10 DIAGNOSIS — Z86718 Personal history of other venous thrombosis and embolism: Secondary | ICD-10-CM

## 2023-09-10 DIAGNOSIS — N400 Enlarged prostate without lower urinary tract symptoms: Secondary | ICD-10-CM | POA: Diagnosis present

## 2023-09-10 DIAGNOSIS — Z5982 Transportation insecurity: Secondary | ICD-10-CM | POA: Diagnosis not present

## 2023-09-10 DIAGNOSIS — R627 Adult failure to thrive: Secondary | ICD-10-CM | POA: Diagnosis present

## 2023-09-10 DIAGNOSIS — G2581 Restless legs syndrome: Secondary | ICD-10-CM | POA: Diagnosis present

## 2023-09-10 DIAGNOSIS — R6521 Severe sepsis with septic shock: Secondary | ICD-10-CM | POA: Diagnosis present

## 2023-09-10 DIAGNOSIS — B965 Pseudomonas (aeruginosa) (mallei) (pseudomallei) as the cause of diseases classified elsewhere: Secondary | ICD-10-CM | POA: Diagnosis not present

## 2023-09-10 DIAGNOSIS — N390 Urinary tract infection, site not specified: Principal | ICD-10-CM | POA: Diagnosis present

## 2023-09-10 DIAGNOSIS — R972 Elevated prostate specific antigen [PSA]: Secondary | ICD-10-CM | POA: Diagnosis not present

## 2023-09-10 DIAGNOSIS — E871 Hypo-osmolality and hyponatremia: Secondary | ICD-10-CM | POA: Diagnosis not present

## 2023-09-10 DIAGNOSIS — N10 Acute tubulo-interstitial nephritis: Secondary | ICD-10-CM | POA: Diagnosis not present

## 2023-09-10 LAB — I-STAT CG4 LACTIC ACID, ED
Lactic Acid, Venous: 4 mmol/L (ref 0.5–1.9)
Lactic Acid, Venous: 3.4 mmol/L (ref 0.5–1.9)

## 2023-09-10 LAB — COMPREHENSIVE METABOLIC PANEL WITH GFR
ALT: 26 U/L (ref 0–44)
AST: 25 U/L (ref 15–41)
Albumin: 3.6 g/dL (ref 3.5–5.0)
Alkaline Phosphatase: 84 U/L (ref 38–126)
Anion gap: 10 (ref 5–15)
BUN: 25 mg/dL — ABNORMAL HIGH (ref 8–23)
CO2: 26 mmol/L (ref 22–32)
Calcium: 9.4 mg/dL (ref 8.9–10.3)
Chloride: 102 mmol/L (ref 98–111)
Creatinine, Ser: 0.69 mg/dL (ref 0.61–1.24)
GFR, Estimated: >60 mL/min (ref >60)
Glucose, Bld: 151 mg/dL — ABNORMAL HIGH (ref 70–99)
Potassium: 4.6 mmol/L (ref 3.5–5.1)
Sodium: 138 mmol/L (ref 135–145)
Total Bilirubin: 0.9 mg/dL (ref 0.0–1.2)
Total Protein: 8.1 g/dL (ref 6.5–8.1)

## 2023-09-10 LAB — CBC WITH DIFFERENTIAL/PLATELET
Abs Immature Granulocytes: 0.08 10*3/uL — ABNORMAL HIGH (ref 0.00–0.07)
Basophils Absolute: 0.1 10*3/uL (ref 0.0–0.1)
Basophils Relative: 0 %
Eosinophils Absolute: 0 10*3/uL (ref 0.0–0.5)
Eosinophils Relative: 0 %
HCT: 40.6 % (ref 39.0–52.0)
Hemoglobin: 13.1 g/dL (ref 13.0–17.0)
Immature Granulocytes: 1 %
Lymphocytes Relative: 2 %
Lymphs Abs: 0.4 10*3/uL — ABNORMAL LOW (ref 0.7–4.0)
MCH: 31.6 pg (ref 26.0–34.0)
MCHC: 32.3 g/dL (ref 30.0–36.0)
MCV: 98.1 fL (ref 80.0–100.0)
Monocytes Absolute: 0.7 10*3/uL (ref 0.1–1.0)
Monocytes Relative: 4 %
Neutro Abs: 14.7 10*3/uL — ABNORMAL HIGH (ref 1.7–7.7)
Neutrophils Relative %: 93 %
Platelets: 310 10*3/uL (ref 150–400)
RBC: 4.14 MIL/uL — ABNORMAL LOW (ref 4.22–5.81)
RDW: 14.3 % (ref 11.5–15.5)
WBC: 15.9 10*3/uL — ABNORMAL HIGH (ref 4.0–10.5)
nRBC: 0 % (ref 0.0–0.2)

## 2023-09-10 LAB — MRSA NEXT GEN BY PCR, NASAL: MRSA by PCR Next Gen: NOT DETECTED

## 2023-09-10 LAB — URINALYSIS, W/ REFLEX TO CULTURE (INFECTION SUSPECTED)
Bilirubin Urine: NEGATIVE
Glucose, UA: NEGATIVE mg/dL
Ketones, ur: NEGATIVE mg/dL
Nitrite: NEGATIVE
Protein, ur: 30 mg/dL — AB
RBC / HPF: >50 RBC/hpf (ref 0–5)
Specific Gravity, Urine: 1.012 (ref 1.005–1.030)
WBC, UA: >50 WBC/hpf (ref 0–5)
pH: 5 (ref 5.0–8.0)

## 2023-09-10 LAB — PROTIME-INR
INR: 1 (ref 0.8–1.2)
Prothrombin Time: 13.8 s (ref 11.4–15.2)

## 2023-09-10 MED ORDER — SENNOSIDES-DOCUSATE SODIUM 8.6-50 MG PO TABS
2.0000 | ORAL_TABLET | Freq: Every day | ORAL | Status: DC
  Administered 2023-09-10 – 2023-09-21 (×12): 2 via ORAL
  Filled 2023-09-10 (×12): qty 2

## 2023-09-10 MED ORDER — ENOXAPARIN SODIUM 40 MG/0.4ML IJ SOSY
40.0000 mg | PREFILLED_SYRINGE | INTRAMUSCULAR | Status: DC
  Administered 2023-09-10 – 2023-09-16 (×7): 40 mg via SUBCUTANEOUS
  Filled 2023-09-10 (×7): qty 0.4

## 2023-09-10 MED ORDER — ONDANSETRON HCL 4 MG PO TABS: 4.0000 mg | ORAL_TABLET | Freq: Four times a day (QID) | ORAL | Status: DC | PRN

## 2023-09-10 MED ORDER — CHLORHEXIDINE GLUCONATE CLOTH 2 % EX PADS
6.0000 | MEDICATED_PAD | Freq: Every day | CUTANEOUS | Status: DC
  Administered 2023-09-10 – 2023-09-21 (×12): 6 via TOPICAL

## 2023-09-10 MED ORDER — LACTATED RINGERS IV BOLUS (SEPSIS)
250.0000 mL | Freq: Once | INTRAVENOUS | Status: AC
  Administered 2023-09-10: 250 mL via INTRAVENOUS

## 2023-09-10 MED ORDER — SODIUM CHLORIDE 0.9 % IV SOLN
2.0000 g | Freq: Once | INTRAVENOUS | Status: AC
  Administered 2023-09-10: 2 g via INTRAVENOUS
  Filled 2023-09-10: qty 12.5

## 2023-09-10 MED ORDER — LACTATED RINGERS IV SOLN: INTRAVENOUS | Status: DC

## 2023-09-10 MED ORDER — METOPROLOL SUCCINATE ER 25 MG PO TB24
12.5000 mg | ORAL_TABLET | Freq: Every day | ORAL | Status: DC
  Administered 2023-09-10: 12.5 mg via ORAL
  Filled 2023-09-10 (×2): qty 1

## 2023-09-10 MED ORDER — LACTATED RINGERS IV BOLUS (SEPSIS)
1000.0000 mL | Freq: Once | INTRAVENOUS | Status: AC
  Administered 2023-09-10: 2000 mL via INTRAVENOUS

## 2023-09-10 MED ORDER — ORAL CARE MOUTH RINSE: 15.0000 mL | OROMUCOSAL | Status: DC | PRN

## 2023-09-10 MED ORDER — BISACODYL 10 MG RE SUPP
10.0000 mg | Freq: Every day | RECTAL | Status: DC | PRN
  Administered 2023-09-10: 10 mg via RECTAL
  Filled 2023-09-10: qty 1

## 2023-09-10 MED ORDER — METOPROLOL SUCCINATE ER 25 MG PO TB24: 12.5000 mg | ORAL_TABLET | Freq: Every day | ORAL | Status: DC

## 2023-09-10 MED ORDER — ACETAMINOPHEN 325 MG PO TABS: 650.0000 mg | ORAL_TABLET | Freq: Four times a day (QID) | ORAL | Status: DC | PRN

## 2023-09-10 MED ORDER — BACLOFEN 20 MG PO TABS
25.0000 mg | ORAL_TABLET | Freq: Every day | ORAL | Status: DC
  Administered 2023-09-10 – 2023-09-21 (×12): 25 mg via ORAL
  Filled 2023-09-10 (×7): qty 1
  Filled 2023-09-10: qty 3
  Filled 2023-09-10: qty 1
  Filled 2023-09-10: qty 3
  Filled 2023-09-10 (×2): qty 1

## 2023-09-10 MED ORDER — LACTATED RINGERS IV BOLUS (SEPSIS)
1000.0000 mL | Freq: Once | INTRAVENOUS | Status: AC
  Administered 2023-09-10: 1000 mL via INTRAVENOUS

## 2023-09-10 MED ORDER — FLUTICASONE PROPIONATE 50 MCG/ACT NA SUSP: 1.0000 | Freq: Every day | NASAL | Status: DC | PRN

## 2023-09-10 MED ORDER — ACETAMINOPHEN 650 MG RE SUPP: 650.0000 mg | Freq: Four times a day (QID) | RECTAL | Status: DC | PRN

## 2023-09-10 MED ORDER — ACETAMINOPHEN 325 MG PO TABS
650.0000 mg | ORAL_TABLET | Freq: Once | ORAL | Status: AC
  Administered 2023-09-10: 650 mg via ORAL
  Filled 2023-09-10: qty 2

## 2023-09-10 MED ORDER — ALBUTEROL SULFATE (2.5 MG/3ML) 0.083% IN NEBU: 2.5000 mg | INHALATION_SOLUTION | RESPIRATORY_TRACT | Status: DC | PRN

## 2023-09-10 MED ORDER — SODIUM CHLORIDE 0.9 % IV SOLN
1.0000 g | Freq: Three times a day (TID) | INTRAVENOUS | Status: DC
  Administered 2023-09-10 – 2023-09-22 (×36): 1 g via INTRAVENOUS
  Filled 2023-09-10 (×39): qty 20

## 2023-09-10 MED ORDER — SODIUM CHLORIDE 0.9 % IV SOLN
1.0000 g | Freq: Once | INTRAVENOUS | Status: AC
  Administered 2023-09-10: 1 g via INTRAVENOUS
  Filled 2023-09-10: qty 20

## 2023-09-10 MED ORDER — MELATONIN 3 MG PO TABS
3.0000 mg | ORAL_TABLET | Freq: Every evening | ORAL | Status: DC | PRN
  Administered 2023-09-14: 3 mg via ORAL
  Administered 2023-09-18 – 2023-09-20 (×2): 6 mg via ORAL
  Filled 2023-09-10 (×2): qty 2
  Filled 2023-09-10: qty 1
  Filled 2023-09-10: qty 2

## 2023-09-10 MED ORDER — LEVOTHYROXINE SODIUM 50 MCG PO TABS
50.0000 ug | ORAL_TABLET | Freq: Every day | ORAL | Status: DC
  Administered 2023-09-10 – 2023-09-22 (×13): 50 ug via ORAL
  Filled 2023-09-10 (×13): qty 1

## 2023-09-10 MED ORDER — ONDANSETRON HCL 4 MG/2ML IJ SOLN: 4.0000 mg | Freq: Four times a day (QID) | INTRAMUSCULAR | Status: DC | PRN

## 2023-09-10 MED ORDER — POLYETHYLENE GLYCOL 3350 17 G PO PACK: 34.0000 g | PACK | Freq: Every day | ORAL | Status: DC | PRN

## 2023-09-10 NOTE — ED Provider Notes (Signed)
 [place] EMERGENCY DEPARTMENT AT [facility] Provider Note   CSN: [id number] Arrival date & time: 09/10/23  8295     History  Chief Complaint  Patient presents with   Abdominal Pain   Fever    Edward Waller is a 89 y.o. male.  The history is provided by the patient.  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  Patient's had previous right ureteral stones that required stent placement Patient denies any vomiting.  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 Authorizing Provider  acetaminophen  (TYLENOL ) 500 MG tablet Take 500 mg by mouth every 6 (six) hours as needed for moderate pain.    [provider]  aspirin  EC 81 MG tablet Take 1 tablet (81 mg total) by mouth daily. Swallow whole. 07/26/23   Lesa Rape, MD  baclofen  (LIORESAL ) 10 MG tablet Take 25 mg by mouth at bedtime.    [provider]  bisacodyl  (DULCOLAX) 10 MG suppository Place 1 suppository (10 mg total) rectally daily after supper. 02/13/23   Love, Renay Carota, PA-C  docusate sodium  (COLACE) 100 MG capsule  Take 1 capsule (100 mg total) by mouth daily as needed for up to 30 doses. 08/22/23   Lahoma Pigg, MD  fluticasone  (FLONASE ) 50 MCG/ACT nasal spray Place 1 spray into both nostrils daily as needed for allergies or rhinitis.    [provider]  levothyroxine  (SYNTHROID ) 50 MCG tablet Take 50 mcg by mouth daily before breakfast.    [provider]  melatonin 3 MG TABS tablet Take 3-6 mg by mouth at bedtime as needed (sleep).    [provider]  metoprolol  succinate (TOPROL -XL) 25 MG 24 hr tablet Take 0.5 tablets (12.5 mg total) by mouth daily. 08/31/23 09/30/23  Faith Homes, MD  miconazole (MICOTIN) 2 % powder Apply 1 Application topically as needed for itching.    [provider]  mirabegron  ER (MYRBETRIQ ) 50 MG TB24 tablet Take 1 tablet (50 mg total) by mouth daily. 02/13/23   Love, Renay Carota, PA-C  Multiple Vitamin (MULTIVITAMIN WITH MINERALS) TABS tablet Take 1 tablet by mouth every morning.    [provider]  polyethylene glycol (MIRALAX  / GLYCOLAX ) 17 g packet Take 34 g by mouth daily. Mix in 8 ounces a fluid per day 02/13/23   Zelda Hickman, PA-C  polyvinyl alcohol  (LIQUIFILM TEARS) 1.4 % ophthalmic solution  Place 1 drop into the right eye as needed for dry eyes (put at bedside for pt). 07/24/21   Love, Renay Carota, PA-C  senna-docusate (SENOKOT-S) 8.6-50 MG tablet Take 3 tablets by mouth daily at 6 (six) AM. Patient taking differently: Take 2 tablets by mouth daily at 6 (six) AM. 02/13/23   Love, Renay Carota, PA-C  Zinc Oxide 16 % OINT Apply 1 application  topically daily as needed.    [provider]      Allergies    Trazodone  and nefazodone    Review of Systems   Review of Systems  Constitutional:  Positive for fatigue and fever.  Gastrointestinal:  Positive for abdominal pain.    Physical Exam Updated Vital Signs BP 118/64 (BP Location: Left Arm)   Pulse (!) 126   Temp 100 F (37.8 C) (Oral)   Resp (!) 25   Ht 1.829 m (6')    SpO2 95%   BMI 21.53 kg/m  Physical Exam CONSTITUTIONAL: Elderly, no acute distress HEAD: Normocephalic/atraumatic EYES: EOMI/PERRL ENMT: Mucous membranes moist NECK: supple no meningeal signs CV: S1/S2 noted, no murmurs/rubs/gallops noted LUNGS: Lungs are clear to auscultation bilaterally, no apparent distress ABDOMEN: soft, nontender, no rebound or guarding, bowel sounds noted throughout abdomen GU: Foley catheter in place on arrival, no overlying erythema to the penis or the scrotum. Nurse present for exam NEURO: Pt is awake/alert EXTREMITIES: No deformities or skin breakdown noted lower extremities SKIN: warm, color normal, no wounds noted to the buttocks or sacrum  ED Results / Procedures / Treatments   Labs (all labs ordered are listed, but only abnormal results are displayed) Labs Reviewed  I-STAT CG4 LACTIC ACID, ED - Abnormal; Notable for the following components:      Result Value   Lactic Acid, Venous 4.0 (*)    All other components within normal limits  CULTURE, BLOOD (ROUTINE X 2)  CULTURE, BLOOD (ROUTINE X 2)  PROTIME-INR  COMPREHENSIVE METABOLIC PANEL WITH GFR  CBC WITH DIFFERENTIAL/PLATELET  URINALYSIS, W/ REFLEX TO CULTURE (INFECTION SUSPECTED)  CBC WITH DIFFERENTIAL/PLATELET    EKG EKG Interpretation Date/Time:  Wednesday Sep 10 2023 05:42:12 EDT Ventricular Rate:  124 PR Interval:  169 QRS Duration:  89 QT Interval:  302 QTC Calculation: 434 R Axis:   -35  Text Interpretation: Sinus tachycardia Left axis deviation Abnormal R-wave progression, late transition Borderline T wave abnormalities Interpretation limited secondary to artifact Confirmed by Eldon Greenland ([id number]) on 09/10/2023 5:56:10 AM  Radiology DG Chest Port 1 View Result Date: 09/10/2023 CLINICAL DATA:  Fever and abdominal pain. EXAM: PORTABLE CHEST 1 VIEW COMPARISON:  08/23/2023 FINDINGS: Stable cardiomediastinal contours. Asymmetric elevation of right hemidiaphragm. Pulmonary vascular  congestion. No significant pleural effusion or airspace consolidation. Postsurgical changes identified within the thoracic spine. IMPRESSION: Pulmonary vascular congestion.  No airspace consolidation. Electronically Signed   By: Kimberley Penman M.D.   On: 09/10/2023 06:27    Procedures .Critical Care  Performed by: Eldon Greenland, MD Authorized by: Eldon Greenland, MD   Critical care provider statement:    Critical care time (minutes):  50   Critical care start time:  09/10/2023 6:00 AM   Critical care end time:  09/10/2023 6:50 AM   Critical care time was exclusive of:  Separately billable procedures and treating other patients   Critical care was necessary to treat or prevent imminent or life-threatening deterioration of the following conditions:  Sepsis, shock and dehydration   Critical care was time spent personally by me on  the following activities:  Obtaining history from patient or surrogate, examination of patient, review of old charts, ordering and review of laboratory studies, pulse oximetry, re-evaluation of patient's condition, development of treatment plan with patient or surrogate, evaluation of patient's response to treatment, ordering and review of radiographic studies and ordering and performing treatments and interventions   I assumed direction of critical care for this patient from another provider in my specialty: no       Medications Ordered in ED Medications  lactated ringers  infusion ( Intravenous New Bag/Given 09/10/23 0644)  lactated ringers  bolus 1,000 mL (has no administration in time range)    And  lactated ringers  bolus 1,000 mL (has no administration in time range)    And  lactated ringers  bolus 250 mL (has no administration in time range)  ceFEPIme  (MAXIPIME ) 2 g in sodium chloride  0.9 % 100 mL IVPB (2 g Intravenous New Bag/Given 09/10/23 0646)  lactated ringers  bolus 1,000 mL (2,000 mLs Intravenous New Bag/Given 09/10/23 0624)  acetaminophen  (TYLENOL ) tablet  650 mg (650 mg Oral Given 09/10/23 0981)    ED Course/ Medical Decision Making/ A&P Clinical Course as of 09/10/23 0703  Wed Sep 10, 2023  0606 With extensive history including paraplegia, chronic indwelling Foley and frequent UTIs and sepsis.  Presents with fever and abdominal pain.  Patient is likely developing early sepsis as he is tachycardic with borderline fever.  Imaging and labs have been ordered [DW]  0645 Patient found to have elevated lactate.  Code sepsis has been initiated.  IV fluids and antibiotics have been ordered [DW]  0654 Call attempts were made to wife and daughter, no answer [DW]  [phone] CT renal has been ordered given extensive urologic source for his sepsis in the past.  Labs are pending at this time [DW]  0702 Signed out to dr trifan with labs/imaging pending.  Pt will need admission  [DW]    Clinical Course User Index [DW] Eldon Greenland, MD                                 Medical Decision Making Amount and/or Complexity of Data Reviewed Labs: ordered. Radiology: ordered.  Risk OTC drugs. Prescription drug management.   This patient presents to the ED for concern of fever and abdominal pain, this involves an extensive number of treatment options, and is a complaint that carries with it a high risk of complications and morbidity.  The differential diagnosis includes but is not limited to cholecystitis, cholelithiasis, pancreatitis, gastritis, peptic ulcer disease, appendicitis, bowel obstruction, bowel perforation, diverticulitis, AAA, ischemic bowel Ureteral stone, UTI  Comorbidities that complicate the patient evaluation: Patient's presentation is complicated by their history of thoracic spine subluxation, chronic indwelling Foley  Social Determinants of Health: Patient's poor mobility  increases the complexity of managing their presentation  Additional history obtained: Records reviewed previous admission documents  Lab Tests: I Ordered, and  personally interpreted labs.  The pertinent results include: Elevated lactate consistent with sepsis  Imaging Studies ordered: I ordered imaging studies including X-ray chest  I independently visualized and interpreted imaging which showed no evidence of pneumonia I agree with the radiologist interpretation  Cardiac Monitoring: The patient was maintained on a cardiac monitor.  I personally viewed and interpreted the cardiac monitor which showed an underlying rhythm of:  sinus tachycardia  Medicines ordered and prescription drug management: I ordered medication including IV fluids and antibiotics for presumed sepsis  Critical Interventions:   IV fluids and antibiotics  Reevaluation: After the interventions noted above, I reevaluated the patient and found that they have :stayed the same  Complexity of problems addressed: Patient's presentation is most consistent with  acute presentation with potential threat to life or bodily function  Disposition: After consideration of the diagnostic results and the patient's response to treatment,  I feel that the patent would benefit from admission  .           Final Clinical Impression(s) / ED Diagnoses Final diagnoses:  None    Rx / DC Orders ED Discharge Orders     None         Eldon Greenland, MD 09/10/23 [phone]
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
## 2023-09-10 NOTE — ED Notes (Signed)
 I-Stat lactic acid 4.04 EDP [place] notified

## 2023-09-10 NOTE — ED Triage Notes (Signed)
 Pt brought by EMS from home, pt complains of abdominal pain and fever. Pt was recently Discharge at [facility] last week for sepsis secondary to UTI. 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 Pt alert x4, paraplegic.

## 2023-09-10 NOTE — Sepsis Progress Note (Signed)
 Elink monitoring for the code sepsis protocol.

## 2023-09-10 NOTE — ED Notes (Signed)
 Patient catheter is not draining his ABD is distended RN attempted a bladder scan but an amount would not register. His wife is at bedside and stated she placed a smaller size catheter in him yesterday and he has a consult for a suprapubic catheter, MD advised RN to remove current catheter and replace and irrigate catheter.

## 2023-09-10 NOTE — Progress Notes (Addendum)
 A consult was received from an ED physician for meropenem  per pharmacy dosing (for an indication other than meningitis). The patient's profile has been reviewed for ht/wt/allergies/indication/available labs. A continuing order has been placed for the above antibiotics (1g IV q8 hr).  Further pharmacy consults should be ordered by admitting physician if indicated.                       Tera Fellows, PharmD, BCPS [phone] 09/10/2023, 7:24 AM
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
## 2023-09-11 ENCOUNTER — Encounter (HOSPITAL_COMMUNITY): Payer: Self-pay | Admitting: Internal Medicine

## 2023-09-11 DIAGNOSIS — N319 Neuromuscular dysfunction of bladder, unspecified: Secondary | ICD-10-CM | POA: Diagnosis not present

## 2023-09-11 DIAGNOSIS — G8221 Paraplegia, complete: Secondary | ICD-10-CM | POA: Diagnosis not present

## 2023-09-11 DIAGNOSIS — N400 Enlarged prostate without lower urinary tract symptoms: Secondary | ICD-10-CM | POA: Diagnosis not present

## 2023-09-11 DIAGNOSIS — N390 Urinary tract infection, site not specified: Secondary | ICD-10-CM | POA: Diagnosis present

## 2023-09-11 DIAGNOSIS — A419 Sepsis, unspecified organism: Secondary | ICD-10-CM

## 2023-09-11 DIAGNOSIS — R6521 Severe sepsis with septic shock: Secondary | ICD-10-CM

## 2023-09-11 LAB — BLOOD CULTURE ID PANEL (REFLEXED) - BCID2

## 2023-09-11 LAB — CBC
HCT: 35.8 % — ABNORMAL LOW (ref 39.0–52.0)
Hemoglobin: 11.7 g/dL — ABNORMAL LOW (ref 13.0–17.0)
MCH: 32.3 pg (ref 26.0–34.0)
MCHC: 32.7 g/dL (ref 30.0–36.0)
MCV: 98.9 fL (ref 80.0–100.0)
Platelets: 200 10*3/uL (ref 150–400)
RBC: 3.62 MIL/uL — ABNORMAL LOW (ref 4.22–5.81)
RDW: 14.5 % (ref 11.5–15.5)
WBC: 9.6 10*3/uL (ref 4.0–10.5)
nRBC: 0 % (ref 0.0–0.2)

## 2023-09-11 LAB — BASIC METABOLIC PANEL WITH GFR
Anion gap: 7 (ref 5–15)
BUN: 15 mg/dL (ref 8–23)
CO2: 24 mmol/L (ref 22–32)
Calcium: 8.1 mg/dL — ABNORMAL LOW (ref 8.9–10.3)
Chloride: 102 mmol/L (ref 98–111)
Creatinine, Ser: 0.54 mg/dL — ABNORMAL LOW (ref 0.61–1.24)
GFR, Estimated: >60 mL/min (ref >60)
Glucose, Bld: 134 mg/dL — ABNORMAL HIGH (ref 70–99)
Potassium: 3.7 mmol/L (ref 3.5–5.1)
Sodium: 133 mmol/L — ABNORMAL LOW (ref 135–145)

## 2023-09-11 MED ORDER — SALINE SPRAY 0.65 % NA SOLN
1.0000 | NASAL | Status: DC | PRN
  Administered 2023-09-11: 1 via NASAL
  Filled 2023-09-11: qty 44

## 2023-09-11 MED ORDER — LACTATED RINGERS IV SOLN: INTRAVENOUS | Status: AC

## 2023-09-11 MED ORDER — BISACODYL 10 MG RE SUPP
10.0000 mg | Freq: Every day | RECTAL | Status: DC
  Administered 2023-09-11 – 2023-09-21 (×11): 10 mg via RECTAL
  Filled 2023-09-11 (×11): qty 1

## 2023-09-11 NOTE — Progress Notes (Signed)
 PHARMACY - PHYSICIAN COMMUNICATION CRITICAL VALUE ALERT - BLOOD CULTURE IDENTIFICATION (BCID)  Edward Waller is an 89 y.o. male who presented to [facility] on 09/10/2023 with a chief complaint of urosepsis. Chronic Foley and has Hx recurrent PsA UTI  Assessment:  1/4 BCx bottles growing P aeruginosa c/w known urinary source; most recent PsA culture R-all tested abx except IMI  Name of physician (or Provider) Contacted: Rai  Current antibiotics: Meropenem  1g q8 hr  Changes to prescribed antibiotics recommended:  Patient is on recommended antibiotics - No changes needed  Results for orders placed or performed during the hospital encounter of 09/10/23  Blood Culture ID Panel (Reflexed) (Collected: 09/10/2023  6:15 AM)  Result Value Ref Range   Enterococcus faecalis NOT DETECTED NOT DETECTED   Enterococcus Faecium NOT DETECTED NOT DETECTED   Listeria monocytogenes NOT DETECTED NOT DETECTED   Staphylococcus species NOT DETECTED NOT DETECTED   Staphylococcus aureus (BCID) NOT DETECTED NOT DETECTED   Staphylococcus epidermidis NOT DETECTED NOT DETECTED   Staphylococcus lugdunensis NOT DETECTED NOT DETECTED   Streptococcus species NOT DETECTED NOT DETECTED   Streptococcus agalactiae NOT DETECTED NOT DETECTED   Streptococcus pneumoniae NOT DETECTED NOT DETECTED   Streptococcus pyogenes NOT DETECTED NOT DETECTED   A.calcoaceticus-baumannii NOT DETECTED NOT DETECTED   Bacteroides fragilis NOT DETECTED NOT DETECTED   Enterobacterales NOT DETECTED NOT DETECTED   Enterobacter cloacae complex NOT DETECTED NOT DETECTED   Escherichia coli NOT DETECTED NOT DETECTED   Klebsiella aerogenes NOT DETECTED NOT DETECTED   Klebsiella oxytoca NOT DETECTED NOT DETECTED   Klebsiella pneumoniae NOT DETECTED NOT DETECTED   Proteus species NOT DETECTED NOT DETECTED   Salmonella species NOT DETECTED NOT DETECTED   Serratia marcescens NOT DETECTED NOT DETECTED   Haemophilus influenzae NOT DETECTED NOT DETECTED    Neisseria meningitidis NOT DETECTED NOT DETECTED   Pseudomonas aeruginosa DETECTED (A) NOT DETECTED   Stenotrophomonas maltophilia NOT DETECTED NOT DETECTED   Candida albicans NOT DETECTED NOT DETECTED   Candida auris NOT DETECTED NOT DETECTED   Candida glabrata NOT DETECTED NOT DETECTED   Candida krusei NOT DETECTED NOT DETECTED   Candida parapsilosis NOT DETECTED NOT DETECTED   Candida tropicalis NOT DETECTED NOT DETECTED   Cryptococcus neoformans/gattii NOT DETECTED NOT DETECTED   CTX-M ESBL NOT DETECTED NOT DETECTED   Carbapenem resistance IMP NOT DETECTED NOT DETECTED   Carbapenem resistance KPC NOT DETECTED NOT DETECTED   Carbapenem resistance NDM NOT DETECTED NOT DETECTED   Carbapenem resistance VIM NOT DETECTED NOT DETECTED    Aliene Tamura A 09/11/2023  10:48 AM
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
## 2023-09-12 DIAGNOSIS — R7881 Bacteremia: Secondary | ICD-10-CM

## 2023-09-12 DIAGNOSIS — R6521 Severe sepsis with septic shock: Secondary | ICD-10-CM | POA: Diagnosis not present

## 2023-09-12 DIAGNOSIS — N390 Urinary tract infection, site not specified: Secondary | ICD-10-CM

## 2023-09-12 DIAGNOSIS — R972 Elevated prostate specific antigen [PSA]: Secondary | ICD-10-CM | POA: Diagnosis not present

## 2023-09-12 DIAGNOSIS — A419 Sepsis, unspecified organism: Secondary | ICD-10-CM | POA: Diagnosis not present

## 2023-09-12 LAB — URINE CULTURE: Culture: >=100000 — AB

## 2023-09-12 NOTE — Progress Notes (Signed)
 PHARMACY CONSULT NOTE FOR:  OUTPATIENT  PARENTERAL ANTIBIOTIC THERAPY (OPAT)  Indication: Pseudomonas bactermia Regimen: Meropenem  1g IV every 8 hours End date: 09/23/23   IV antibiotic discharge orders are pended. To discharging provider:  please sign these orders via discharge navigator,  Select New Orders & click on the button choice - Manage This Unsigned Work.     Thank you for allowing pharmacy to be a part of this patient's care.  Garland Junk, PharmD, BCPS, BCIDP Infectious Diseases Clinical Pharmacist 09/12/2023 2:59 PM   **Pharmacist phone directory can now be found on [email or web address] ([email or web address]).  Listed under [facility].
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
## 2023-09-12 NOTE — Consult Note (Signed)
 [facility]  Total days of antibiotics 2       Reason for Consult:4/ day 3 meropenem     Referring Physician: danford  Principal Problem:   Septic shock (HCC) Active Problems:   Hypothyroidism   BPH (benign prostatic hyperplasia)   Complete paraplegia (HCC)   Neurogenic bladder   UTI (urinary tract infection)    HPI: Edward Waller is a 89 y.o. 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 In march, he was admitted for sepsis due to right proximal ureteralstone - he underwent right JJ stent placement with foley on 3/31 and discharged on 14 days of ciprofloxacin  for PsA UTI and bacteremia -through mid April. On may 2nd he underwent right ureteroscopy with laser lithotripsy and right ureteral stent placement, but then was admitted with fevers up to 100.80F, concern for sepsis due recent urologic instrumentation. His infectious work up again showed +PsA in urine and 1/4 blood cx+, however his PsA now R to FQ and ceftaz. He was treated with 7 days of meropenem  through 5/11. He was discharged home to be cared for his wife, 86yo retired Engineer, civil (consulting). At home he still continued to get CIC but his wife reported frequent leakage and started to have rash due to moisture. She inserted foley catheter the night before his admission. That evening, he was more lethargic and with fever of 100.3F., she brought patient in due to concern for recurrent infection. On this admission, he had fever and wbc of 9.6. his urine cx again + PsA and blood cx also + with PsA. He was transitioned to meropenem  due to concern for drug resistant isolate. Renal CT shows punctate nonobstructive stone in the right kidney. Patient is connected with urology and there were plans prior to this admission to consider SP catheter. 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   Cataract    bilateral   Fx ankle    left; Dec 2021   Hip pain, chronic, right    History of kidney stones    Hypertension    Hypothyroidism    Liver cyst    Low back pain    OA (osteoarthritis) of hip    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    Renal cyst, left    Restless leg syndrome    Sleep apnea    Spinal stenosis    Thyroid disease     Allergies:  Allergies  Allergen Reactions   Trazodone  And Nefazodone Other (See Comments)    nightmares    Current antibiotics:   MEDICATIONS:  baclofen   25 mg Oral QHS   bisacodyl   10 mg Rectal Q1400   Chlorhexidine  Gluconate Cloth  6 each Topical Daily   enoxaparin  (LOVENOX ) injection  40 mg Subcutaneous Q24H   levothyroxine   50 mcg Oral QAC breakfast   senna-docusate  2 tablet Oral Q0600    Social History   Tobacco Use   Smoking status: Never   Smokeless tobacco: Never  Vaping Use   Vaping status: Never Used  Substance Use Topics   Alcohol  use: Never   Drug use: Never  No family history on file.  Review of Systems -  12 point ros is reviewed. Otherwise negative except what is mentioned in hpi  OBJECTIVE: Temp:  [97.9 F (36.6 C)-98.7 F (37.1 C)] 97.9 F (36.6 C) (05/23 1114) Pulse Rate:  [64-102] 75 (05/23 1114) Resp:  [8-25] 18 (05/23 1114) BP: (98-162)/(41-89) 142/83 (05/23 1114) SpO2:  [86 %-97 %] 97 % (05/23 1114) Physical Exam  Constitutional: He is oriented to person, place, and time. He appears well-developed and well-nourished. No distress.  HENT:  Mouth/Throat: Oropharynx is clear and moist. No oropharyngeal exudate.  Cardiovascular: Normal rate, regular rhythm and normal heart sounds. Exam reveals no gallop and no friction rub.  No murmur heard.  Pulmonary/Chest: Effort normal and breath sounds normal. No respiratory distress. He  has no wheezes.  Abdominal: Soft. Bowel sounds are normal. He exhibits no distension. There is no tenderness.  Lymphadenopathy:  He has no cervical adenopathy.  Neurological: He is alert and oriented to person, place, and time.  Skin: Skin is warm and dry. No rash noted. No erythema.  Psychiatric: He has a normal mood and affect. His behavior is normal.    LABS: Results for orders placed or performed during the hospital encounter of 09/10/23 (from the past 48 hours)  Basic metabolic panel     Status: Abnormal   Collection Time: 09/11/23  3:21 AM  Result Value Ref Range   Sodium 133 (L) 135 - 145 mmol/L   Potassium 3.7 3.5 - 5.1 mmol/L   Chloride 102 98 - 111 mmol/L   CO2 24 22 - 32 mmol/L   Glucose, Bld 134 (H) 70 - 99 mg/dL    Comment: Glucose reference range applies only to samples taken after fasting for at least 8 hours.   BUN 15 8 - 23 mg/dL   Creatinine, Ser 1.61 (L) 0.61 - 1.24 mg/dL   Calcium  8.1 (L) 8.9 - 10.3 mg/dL   GFR, Estimated >09 >60 mL/min    Comment: (NOTE) Calculated using the CKD-EPI Creatinine Equation (2021)    Anion gap 7 5 - 15    Comment: Performed at [facility], [address], [place], [place] [address]  CBC     Status: Abnormal   Collection Time: 09/11/23  3:21 AM  Result Value Ref Range   WBC 9.6 4.0 - 10.5 K/uL   RBC 3.62 (L) 4.22 - 5.81 MIL/uL   Hemoglobin 11.7 (L) 13.0 - 17.0 g/dL   HCT 81.1 (L) 91.4 - 78.2 %   MCV 98.9 80.0 - 100.0 fL   MCH 32.3 26.0 - 34.0 pg   MCHC 32.7 30.0 - 36.0 g/dL   RDW 95.6 21.3 - 08.6 %   Platelets 200 150 - 400 K/uL   nRBC 0.0 0.0 - 0.2 %    Comment: Performed at [facility], [address], [place], [place] [address]    MICRO: Blood cx 5/21 1 of 2 sets pseudomonas Urine cx 5/21 blood cx   IMAGING: IMPRESSION: 1. Malpositioned Foley catheter. The retention balloon appears to be within the membranous urethra. Despite the suboptimal positioning, there is no  significant bladder distension. 2. Punctate nonobstructing stone in the interpolar collecting system of the right kidney. 3. Additional ancillary findings as above without significant interval change.  HISTORICAL MICRO/IMAGING Blood cx 5/7 x 2 NGTD Urine cx 5/3 pseudomonas Blood cx 5/3 blood cx in 1 of 2 sets PsA Blood cx 3/30 blood 1 set PsA Urine cx 3/30 urine  cx PsA  Assessment/Plan:  89yo M with paraplegia, and neurogenic bladder colonized with PsA increasingly MDRO, has had 2 course of treatment for PsA UTI/Bacteremia in the last 2 months. 2 courses due to urologic instrumentation but this one possibly from I/O catheterization. - recommend TTE - will recommend 14 day course of meropenem , can get picc line tomorrow  - would recommend to get SP catheter either towards 10-14day while on abtx, but could do as early as day 7 of meropenem . Ideally needs to be on carbapenem for procedural prophylaxis due drug resistant PsA  Left piv early phlebitis = recommend removal of piv and supportive care  Dr Ernie Heal available for questions. I will see back on Monday if still here.

## 2023-09-13 ENCOUNTER — Encounter (HOSPITAL_COMMUNITY): Payer: Self-pay | Admitting: Internal Medicine

## 2023-09-13 DIAGNOSIS — T83511A Infection and inflammatory reaction due to indwelling urethral catheter, initial encounter: Secondary | ICD-10-CM | POA: Diagnosis not present

## 2023-09-13 DIAGNOSIS — G8221 Paraplegia, complete: Secondary | ICD-10-CM | POA: Diagnosis not present

## 2023-09-13 DIAGNOSIS — A419 Sepsis, unspecified organism: Secondary | ICD-10-CM | POA: Diagnosis not present

## 2023-09-13 DIAGNOSIS — N319 Neuromuscular dysfunction of bladder, unspecified: Secondary | ICD-10-CM | POA: Diagnosis not present

## 2023-09-13 LAB — CULTURE, BLOOD (ROUTINE X 2)

## 2023-09-13 LAB — CBC
HCT: 42 % (ref 39.0–52.0)
Hemoglobin: 13.1 g/dL (ref 13.0–17.0)
MCH: 31.4 pg (ref 26.0–34.0)
MCHC: 31.2 g/dL (ref 30.0–36.0)
MCV: 100.7 fL — ABNORMAL HIGH (ref 80.0–100.0)
Platelets: 209 10*3/uL (ref 150–400)
RBC: 4.17 MIL/uL — ABNORMAL LOW (ref 4.22–5.81)
RDW: 14 % (ref 11.5–15.5)
WBC: 5.7 10*3/uL (ref 4.0–10.5)
nRBC: 0 % (ref 0.0–0.2)

## 2023-09-13 LAB — COMPREHENSIVE METABOLIC PANEL WITH GFR
ALT: 18 U/L (ref 0–44)
AST: 18 U/L (ref 15–41)
Albumin: 2.8 g/dL — ABNORMAL LOW (ref 3.5–5.0)
Alkaline Phosphatase: 64 U/L (ref 38–126)
Anion gap: 8 (ref 5–15)
BUN: 13 mg/dL (ref 8–23)
CO2: 26 mmol/L (ref 22–32)
Calcium: 9 mg/dL (ref 8.9–10.3)
Chloride: 104 mmol/L (ref 98–111)
Creatinine, Ser: 0.54 mg/dL — ABNORMAL LOW (ref 0.61–1.24)
GFR, Estimated: >60 mL/min (ref >60)
Glucose, Bld: 133 mg/dL — ABNORMAL HIGH (ref 70–99)
Potassium: 4.6 mmol/L (ref 3.5–5.1)
Sodium: 138 mmol/L (ref 135–145)
Total Bilirubin: 0.6 mg/dL (ref 0.0–1.2)
Total Protein: 6.8 g/dL (ref 6.5–8.1)

## 2023-09-13 LAB — GLUCOSE, CAPILLARY: Glucose-Capillary: 119 mg/dL — ABNORMAL HIGH (ref 70–99)

## 2023-09-13 NOTE — Progress Notes (Signed)
 OT Cancellation Note  Patient Details Name: Edward Waller MRN: [id number] DOB: 02-14-1933   Cancelled Treatment:    Reason Eval/Treat Not Completed: OT screened, no needs identified, will sign off Per nurse, patient is able to engage in self feeding and Ubdress/bathing tasks at bed level. Patient is TD for LB at baseline. Patient was evaluated by OT on 07/23/23 with recommendations for 24/7 caregiver support in next level of care. Please see this evaluation for more information. OT to sign off.  Wynette Heckler, MS Acute Rehabilitation Department Office# [phone]  09/13/2023, 8:44 AM
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
## 2023-09-13 NOTE — Progress Notes (Signed)
 PT Cancellation Note / Screen  Patient Details Name: UCHECHUKWU DHAWAN MRN: [id number] DOB: [birth year]   Cancelled Treatment:    Reason Eval/Treat Not Completed: PT screened, no needs identified, will sign off  Pt seen and evaluated by PT with previous admission on 07/23/23.  Note states: "Patient presents with paraplegia since 09/2022, has been through 2 inpatient rehabs. Patient is not considered a candidate for further skilled PT in a post acute rehab facility.  Patient's wife is caregiver, has a sky lift at home, wife transfers patient to a motorized [facility]  which patient is able to motor through his home."  Please refer to this note for further information however appears pt is not a skilled candidate for therapy.  PT to sign off.   Myna Asal Payson 09/13/2023, 8:52 AM Blanch Bunde, DPT Physical Therapist Acute Rehabilitation Services Office: [phone]

## 2023-09-14 ENCOUNTER — Inpatient Hospital Stay (HOSPITAL_COMMUNITY)

## 2023-09-14 DIAGNOSIS — T83511A Infection and inflammatory reaction due to indwelling urethral catheter, initial encounter: Secondary | ICD-10-CM | POA: Diagnosis not present

## 2023-09-14 DIAGNOSIS — R7881 Bacteremia: Secondary | ICD-10-CM

## 2023-09-14 DIAGNOSIS — G8221 Paraplegia, complete: Secondary | ICD-10-CM | POA: Diagnosis not present

## 2023-09-14 DIAGNOSIS — A419 Sepsis, unspecified organism: Secondary | ICD-10-CM | POA: Diagnosis not present

## 2023-09-14 DIAGNOSIS — N319 Neuromuscular dysfunction of bladder, unspecified: Secondary | ICD-10-CM | POA: Diagnosis not present

## 2023-09-14 LAB — CBC
HCT: 40.9 % (ref 39.0–52.0)
Hemoglobin: 13.3 g/dL (ref 13.0–17.0)
MCH: 31.4 pg (ref 26.0–34.0)
MCHC: 32.5 g/dL (ref 30.0–36.0)
MCV: 96.7 fL (ref 80.0–100.0)
Platelets: 216 10*3/uL (ref 150–400)
RBC: 4.23 MIL/uL (ref 4.22–5.81)
RDW: 14 % (ref 11.5–15.5)
WBC: 5.4 10*3/uL (ref 4.0–10.5)
nRBC: 0 % (ref 0.0–0.2)

## 2023-09-14 LAB — ECHOCARDIOGRAM COMPLETE
AR max vel: 1.17 cm2
AV Area VTI: 0.97 cm2
AV Area mean vel: 1.28 cm2
AV Mean grad: 4 mmHg
AV Peak grad: 9.4 mmHg
Ao pk vel: 1.53 m/s
Area-P 1/2: 2.5 cm2
Calc EF: 71.4 %
Height: 72 in
S' Lateral: 2.9 cm
Single Plane A2C EF: 70.6 %
Single Plane A4C EF: 71.7 %
Weight: 2800 [oz_av]

## 2023-09-14 LAB — RENAL FUNCTION PANEL
Albumin: 3 g/dL — ABNORMAL LOW (ref 3.5–5.0)
Anion gap: 9 (ref 5–15)
BUN: 16 mg/dL (ref 8–23)
CO2: 24 mmol/L (ref 22–32)
Calcium: 8.9 mg/dL (ref 8.9–10.3)
Chloride: 105 mmol/L (ref 98–111)
Creatinine, Ser: 0.74 mg/dL (ref 0.61–1.24)
GFR, Estimated: >60 mL/min (ref >60)
Glucose, Bld: 137 mg/dL — ABNORMAL HIGH (ref 70–99)
Phosphorus: 2.7 mg/dL (ref 2.5–4.6)
Potassium: 3.9 mmol/L (ref 3.5–5.1)
Sodium: 138 mmol/L (ref 135–145)

## 2023-09-14 LAB — GLUCOSE, CAPILLARY
Glucose-Capillary: 114 mg/dL — ABNORMAL HIGH (ref 70–99)
Glucose-Capillary: 126 mg/dL — ABNORMAL HIGH (ref 70–99)

## 2023-09-14 MED ORDER — PERFLUTREN LIPID MICROSPHERE
1.0000 mL | INTRAVENOUS | Status: AC | PRN
  Administered 2023-09-14: 2 mL via INTRAVENOUS
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
## 2023-09-14 NOTE — Plan of Care (Signed)

## 2023-09-14 NOTE — Plan of Care (Signed)
?  Problem: Education: ?Goal: Knowledge of General Education information will improve ?Description: Including pain rating scale, medication(s)/side effects and non-pharmacologic comfort measures ?Outcome: Progressing ?  ?Problem: Nutrition: ?Goal: Adequate nutrition will be maintained ?Outcome: Progressing ?  ?Problem: Elimination: ?Goal: Will not experience complications related to bowel motility ?Outcome: Progressing ?Goal: Will not experience complications related to urinary retention ?Outcome: Progressing ?  ?Problem: Safety: ?Goal: Ability to remain free from injury will improve ?Outcome: Progressing ?  ?Problem: Skin Integrity: ?Goal: Risk for impaired skin integrity will decrease ?Outcome: Progressing ?  ?

## 2023-09-14 NOTE — Progress Notes (Signed)
*  PRELIMINARY RESULTS* Echocardiogram 2D Echocardiogram has been performed.  Edward Waller 09/14/2023, 2:43 PM

## 2023-09-15 DIAGNOSIS — T83511A Infection and inflammatory reaction due to indwelling urethral catheter, initial encounter: Secondary | ICD-10-CM | POA: Diagnosis not present

## 2023-09-15 DIAGNOSIS — G8221 Paraplegia, complete: Secondary | ICD-10-CM | POA: Diagnosis not present

## 2023-09-15 DIAGNOSIS — N10 Acute tubulo-interstitial nephritis: Secondary | ICD-10-CM

## 2023-09-15 DIAGNOSIS — B965 Pseudomonas (aeruginosa) (mallei) (pseudomallei) as the cause of diseases classified elsewhere: Secondary | ICD-10-CM

## 2023-09-15 DIAGNOSIS — R972 Elevated prostate specific antigen [PSA]: Secondary | ICD-10-CM | POA: Diagnosis not present

## 2023-09-15 DIAGNOSIS — N319 Neuromuscular dysfunction of bladder, unspecified: Secondary | ICD-10-CM | POA: Diagnosis not present

## 2023-09-15 DIAGNOSIS — A419 Sepsis, unspecified organism: Secondary | ICD-10-CM | POA: Diagnosis not present

## 2023-09-15 DIAGNOSIS — R7881 Bacteremia: Secondary | ICD-10-CM | POA: Diagnosis not present

## 2023-09-15 DIAGNOSIS — N390 Urinary tract infection, site not specified: Secondary | ICD-10-CM | POA: Diagnosis not present

## 2023-09-15 LAB — CBC
HCT: 42.7 % (ref 39.0–52.0)
Hemoglobin: 13.7 g/dL (ref 13.0–17.0)
MCH: 31.7 pg (ref 26.0–34.0)
MCHC: 32.1 g/dL (ref 30.0–36.0)
MCV: 98.8 fL (ref 80.0–100.0)
Platelets: 205 10*3/uL (ref 150–400)
RBC: 4.32 MIL/uL (ref 4.22–5.81)
RDW: 14 % (ref 11.5–15.5)
WBC: 5.4 10*3/uL (ref 4.0–10.5)
nRBC: 0 % (ref 0.0–0.2)

## 2023-09-15 LAB — RENAL FUNCTION PANEL
Albumin: 2.9 g/dL — ABNORMAL LOW (ref 3.5–5.0)
Anion gap: 9 (ref 5–15)
BUN: 20 mg/dL (ref 8–23)
CO2: 24 mmol/L (ref 22–32)
Calcium: 8.9 mg/dL (ref 8.9–10.3)
Chloride: 104 mmol/L (ref 98–111)
Creatinine, Ser: 0.7 mg/dL (ref 0.61–1.24)
GFR, Estimated: >60 mL/min (ref >60)
Glucose, Bld: 126 mg/dL — ABNORMAL HIGH (ref 70–99)
Phosphorus: 3.2 mg/dL (ref 2.5–4.6)
Potassium: 4.3 mmol/L (ref 3.5–5.1)
Sodium: 137 mmol/L (ref 135–145)

## 2023-09-15 LAB — CULTURE, BLOOD (ROUTINE X 2): Culture: NO GROWTH

## 2023-09-15 LAB — GLUCOSE, CAPILLARY
Glucose-Capillary: 116 mg/dL — ABNORMAL HIGH (ref 70–99)
Glucose-Capillary: 143 mg/dL — ABNORMAL HIGH (ref 70–99)
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
## 2023-09-15 NOTE — Plan of Care (Signed)

## 2023-09-15 NOTE — Progress Notes (Signed)
 [facility]    Date of Admission:  09/10/2023   Total days of antibiotics 6   ID: [name] is a 89 y.o. male with  recurrent MDRO PsA bacteremia/uti Principal Problem:   Septic shock (HCC) Active Problems:   Hypothyroidism   BPH (benign prostatic hyperplasia)   Complete paraplegia (HCC)   Neurogenic bladder   UTI (urinary tract infection)    Subjective: Afebrile, wanted more information about the suprapubic catheter procedure, and how it will be maintained. Underwent TTE that did not show any evidence of vegetation  Medications:   baclofen   25 mg Oral QHS   bisacodyl   10 mg Rectal Q1400   Chlorhexidine  Gluconate Cloth  6 each Topical Daily   enoxaparin  (LOVENOX ) injection  40 mg Subcutaneous Q24H   levothyroxine   50 mcg Oral QAC breakfast   senna-docusate  2 tablet Oral Q0600    Objective: Vital signs in last 24 hours: Temp:  [98 F (36.7 C)-98.2 F (36.8 C)] 98.2 F (36.8 C) (05/25 2228) Pulse Rate:  [70-82] 70 (05/26 0537) Resp:  [18] 18 (05/25 2228) BP: (124-150)/(70-91) 124/70 (05/26 0537) SpO2:  [92 %-97 %] 95 % (05/26 0537)  Physical Exam  Constitutional: He is oriented to person, place, and time. He appears well-developed and well-nourished. No distress.  HENT:  Mouth/Throat: Oropharynx is clear and moist. No oropharyngeal exudate.  Cardiovascular: Normal rate, regular rhythm and normal heart sounds. Exam reveals no gallop and no friction rub.  No murmur heard.  Pulmonary/Chest: Effort normal and breath sounds normal. No respiratory distress. He has no wheezes.  Abdominal: Soft. Bowel sounds are normal. He exhibits no distension. There is no tenderness.  Lymphadenopathy:  He has no cervical adenopathy.  Neurological: He is alert and oriented to person, place, and time. Paraplegia from nipple line down/loss of sensation/motor Skin: Skin is warm and dry. No rash noted. No erythema.  Psychiatric: He has a normal mood and  affect. His behavior is normal.    Lab Results Recent Labs    09/13/23 0521 09/14/23 0442 09/15/23 0423  WBC 5.7 5.4  --   HGB 13.1 13.3  --   HCT 42.0 40.9  --   NA 138 138 137  K 4.6 3.9 4.3  CL 104 105 104  CO2 26 24 24   BUN 13 16 20   CREATININE 0.54* 0.74 0.70   Liver Panel Recent Labs    09/13/23 0521 09/14/23 0442 09/15/23 0423  PROT 6.8  --   --   ALBUMIN  2.8* 3.0* 2.9*  AST 18  --   --   ALT 18  --   --   ALKPHOS 64  --   --   BILITOT 0.6  --   --    Sedimentation Rate No results for input(s): "ESRSEDRATE" in the last 72 hours. C-Reactive Protein No results for input(s): "CRP" in the last 72 hours.  Microbiology: 5/21 blood cx 1 of 2 sets with PsA 5/21 urine cx : PsA Pseudomonas aeruginosa      MIC    CEFTAZIDIME 16 INTERMED... Intermediate    CIPROFLOXACIN  1 INTERMEDI... Intermediate    GENTAMICIN <=1 SENSITIVE Sensitive    IMIPENEM 2 SENSITIVE Sensitive    Studies/Results: ECHOCARDIOGRAM COMPLETE Result Date: 09/14/2023    ECHOCARDIOGRAM REPORT   Patient Name:   Edward Waller Date of Exam: 09/14/2023 Medical Rec #:  [id number]       Height:       72.0  in Accession #:    [id number]      Weight:       175.0 lb Date of Birth:  02/26/1933       BSA:          2.013 m Patient Age:    89 years        BP:           155/81 mmHg Patient Gender: M               HR:           75 bpm. Exam Location:  Inpatient Procedure: 2D Echo, Cardiac Doppler and Color Doppler (Both Spectral and Color            Flow Doppler were utilized during procedure). Indications:    Bacteremia  History:        Patient has prior history of Echocardiogram examinations, most                 recent 07/21/2023.  Sonographer:    Andrena Bang Referring Phys: [phone] RIPUDEEP K RAI IMPRESSIONS  1. Left ventricular ejection fraction, by estimation, is 60 to 65%. The left ventricle has normal function. The left ventricle has no regional wall motion abnormalities. There is mild left ventricular hypertrophy of  the basal-septal segment. Left ventricular diastolic parameters are consistent with Grade I diastolic dysfunction (impaired relaxation).  2. Right ventricular systolic function is normal. The right ventricular size is normal.  3. The mitral valve is normal in structure. No evidence of mitral valve regurgitation. No evidence of mitral stenosis.  4. The aortic valve is tricuspid. Aortic valve regurgitation is not visualized. Aortic valve sclerosis is present, with no evidence of aortic valve stenosis.  5. Aortic dilatation noted. There is borderline dilatation of the aortic root, measuring 38 mm.  6. The inferior vena cava is normal in size with greater than 50% respiratory variability, suggesting right atrial pressure of 3 mmHg. FINDINGS  Left Ventricle: Left ventricular ejection fraction, by estimation, is 60 to 65%. The left ventricle has normal function. The left ventricle has no regional wall motion abnormalities. Definity  contrast agent was given IV to delineate the left ventricular  endocardial borders. The left ventricular internal cavity size was normal in size. There is mild left ventricular hypertrophy of the basal-septal segment. Left ventricular diastolic parameters are consistent with Grade I diastolic dysfunction (impaired relaxation). Right Ventricle: The right ventricular size is normal. Right ventricular systolic function is normal. Left Atrium: Left atrial size was normal in size. Right Atrium: Right atrial size was normal in size. Pericardium: There is no evidence of pericardial effusion. Mitral Valve: The mitral valve is normal in structure. No evidence of mitral valve regurgitation. No evidence of mitral valve stenosis. Tricuspid Valve: The tricuspid valve is normal in structure. Tricuspid valve regurgitation is mild . No evidence of tricuspid stenosis. Aortic Valve: The aortic valve is tricuspid. Aortic valve regurgitation is not visualized. Aortic valve sclerosis is present, with no evidence of  aortic valve stenosis. Aortic valve mean gradient measures 4.0 mmHg. Aortic valve peak gradient measures 9.4  mmHg. Aortic valve area, by VTI measures 0.97 cm. Pulmonic Valve: The pulmonic valve was normal in structure. Pulmonic valve regurgitation is not visualized. No evidence of pulmonic stenosis. Aorta: Aortic dilatation noted. There is borderline dilatation of the aortic root, measuring 38 mm. Venous: The inferior vena cava is normal in size with greater than 50% respiratory variability, suggesting right atrial pressure of 3 mmHg.  IAS/Shunts: The interatrial septum was not well visualized.  LEFT VENTRICLE PLAX 2D LVIDd:         4.00 cm      Diastology LVIDs:         2.90 cm      LV e' medial:    5.11 cm/s LV PW:         1.00 cm      LV E/e' medial:  9.7 LV IVS:        1.20 cm      LV e' lateral:   4.46 cm/s LVOT diam:     1.60 cm      LV E/e' lateral: 11.1 LV SV:         28 LV SV Index:   14 LVOT Area:     2.01 cm  LV Volumes (MOD) LV vol d, MOD A2C: 91.5 ml LV vol d, MOD A4C: 104.0 ml LV vol s, MOD A2C: 26.9 ml LV vol s, MOD A4C: 29.4 ml LV SV MOD A2C:     64.6 ml LV SV MOD A4C:     104.0 ml LV SV MOD BP:      70.2 ml RIGHT VENTRICLE TAPSE (M-mode): 2.2 cm LEFT ATRIUM             Index LA diam:        2.40 cm 1.19 cm/m LA Vol (A2C):   24.0 ml 11.92 ml/m LA Vol (A4C):   11.5 ml 5.71 ml/m LA Biplane Vol: 16.5 ml 8.20 ml/m  AORTIC VALVE AV Area (Vmax):    1.17 cm AV Area (Vmean):   1.28 cm AV Area (VTI):     0.97 cm AV Vmax:           153.00 cm/s AV Vmean:          90.300 cm/s AV VTI:            0.285 m AV Peak Grad:      9.4 mmHg AV Mean Grad:      4.0 mmHg LVOT Vmax:         89.40 cm/s LVOT Vmean:        57.600 cm/s LVOT VTI:          0.138 m LVOT/AV VTI ratio: 0.48  AORTA Ao Asc diam: 3.40 cm MITRAL VALVE               TRICUSPID VALVE MV Area (PHT): 2.50 cm    TR Peak grad:   19.2 mmHg MV Decel Time: 304 msec    TR Vmax:        219.00 cm/s MV E velocity: 49.70 cm/s MV A velocity: 82.30 cm/s  SHUNTS  MV E/A ratio:  0.60        Systemic VTI:  0.14 m                            Systemic Diam: 1.60 cm Alexandria Angel MD Electronically signed by Alexandria Angel MD Signature Date/Time: 09/14/2023/4:21:26 PM    Final      Assessment/Plan: 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 Currently day 6 of abtx. Recommend to get SP catheter while on abtx, possibly after 7th day of iv abtx. Will plan on prolonged course of iv abtx. Trying to achieve better source/control.   Continue on contact isolation for mdro  I have personally spent 35 minutes involved  in face-to-face and non-face-to-face activities for this patient on the day of the visit. Professional time spent includes the following activities: Preparing to see the patient (review of tests), Obtaining and/or reviewing separately obtained history (admission/discharge record), Performing a medically appropriate examination and/or evaluation ,and communicating with other health care professionals, Documenting clinical information in the EMR, Independently interpreting results (not separately reported), Communicating the SP catheter procedure, giving illustrations/pictures of what the procedure would like.  evaluation of this patient requires complex antimicrobial therapy evaluation and counseling and isolation needs for disease transmission risk assessment and mitigation.     [facility] Pager: [phone]  09/15/2023, 9:53 AM

## 2023-09-16 DIAGNOSIS — I808 Phlebitis and thrombophlebitis of other sites: Secondary | ICD-10-CM

## 2023-09-16 DIAGNOSIS — A419 Sepsis, unspecified organism: Secondary | ICD-10-CM | POA: Diagnosis not present

## 2023-09-16 DIAGNOSIS — N319 Neuromuscular dysfunction of bladder, unspecified: Secondary | ICD-10-CM | POA: Diagnosis not present

## 2023-09-16 DIAGNOSIS — G8221 Paraplegia, complete: Secondary | ICD-10-CM | POA: Diagnosis not present

## 2023-09-16 DIAGNOSIS — N400 Enlarged prostate without lower urinary tract symptoms: Secondary | ICD-10-CM | POA: Diagnosis not present

## 2023-09-16 DIAGNOSIS — R7881 Bacteremia: Secondary | ICD-10-CM | POA: Diagnosis not present

## 2023-09-16 DIAGNOSIS — N390 Urinary tract infection, site not specified: Secondary | ICD-10-CM | POA: Diagnosis not present

## 2023-09-16 DIAGNOSIS — R972 Elevated prostate specific antigen [PSA]: Secondary | ICD-10-CM | POA: Diagnosis not present

## 2023-09-16 LAB — RENAL FUNCTION PANEL
Albumin: 3.1 g/dL — ABNORMAL LOW (ref 3.5–5.0)
Anion gap: 9 (ref 5–15)
BUN: 24 mg/dL — ABNORMAL HIGH (ref 8–23)
CO2: 25 mmol/L (ref 22–32)
Calcium: 9.4 mg/dL (ref 8.9–10.3)
Chloride: 107 mmol/L (ref 98–111)
Creatinine, Ser: 0.67 mg/dL (ref 0.61–1.24)
GFR, Estimated: >60 mL/min (ref >60)
Glucose, Bld: 133 mg/dL — ABNORMAL HIGH (ref 70–99)
Phosphorus: 3 mg/dL (ref 2.5–4.6)
Potassium: 4.5 mmol/L (ref 3.5–5.1)
Sodium: 141 mmol/L (ref 135–145)

## 2023-09-16 LAB — CBC
HCT: 44.1 % (ref 39.0–52.0)
Hemoglobin: 14.2 g/dL (ref 13.0–17.0)
MCH: 31.6 pg (ref 26.0–34.0)
MCHC: 32.2 g/dL (ref 30.0–36.0)
MCV: 98.2 fL (ref 80.0–100.0)
Platelets: 197 10*3/uL (ref 150–400)
RBC: 4.49 MIL/uL (ref 4.22–5.81)
RDW: 14 % (ref 11.5–15.5)
WBC: 6.1 10*3/uL (ref 4.0–10.5)
nRBC: 0 % (ref 0.0–0.2)

## 2023-09-16 LAB — GLUCOSE, CAPILLARY
Glucose-Capillary: 111 mg/dL — ABNORMAL HIGH (ref 70–99)
Glucose-Capillary: 132 mg/dL — ABNORMAL HIGH (ref 70–99)

## 2023-09-16 MED ORDER — SODIUM CHLORIDE 0.9% FLUSH
10.0000 mL | Freq: Two times a day (BID) | INTRAVENOUS | Status: DC
  Administered 2023-09-16 – 2023-09-21 (×11): 10 mL

## 2023-09-16 MED ORDER — ENOXAPARIN SODIUM 40 MG/0.4ML IJ SOSY
40.0000 mg | PREFILLED_SYRINGE | INTRAMUSCULAR | Status: DC
  Administered 2023-09-18 – 2023-09-21 (×4): 40 mg via SUBCUTANEOUS
  Filled 2023-09-16 (×4): qty 0.4

## 2023-09-16 MED ORDER — SODIUM CHLORIDE 0.9% FLUSH: 10.0000 mL | INTRAVENOUS | Status: DC | PRN

## 2023-09-16 NOTE — Progress Notes (Signed)
    [facility]    Date of Admission:  09/10/2023   Total days of antibiotics 7   ID: Edward Waller is a 89 y.o. male with  MDRO pseudomonal uti Principal Problem:   Septic shock (HCC) Active Problems:   Hypothyroidism   BPH (benign prostatic hyperplasia)   Complete paraplegia (HCC)   Neurogenic bladder   UTI (urinary tract infection)    Subjective: Afebrile. Mild tenderness to right piv site  Medications:   baclofen   25 mg Oral QHS   bisacodyl   10 mg Rectal Q1400   Chlorhexidine  Gluconate Cloth  6 each Topical Daily   [START ON 09/18/2023] enoxaparin  (LOVENOX ) injection  40 mg Subcutaneous Q24H   levothyroxine   50 mcg Oral QAC breakfast   senna-docusate  2 tablet Oral Q0600   sodium chloride  flush  10-40 mL Intracatheter Q12H    Objective: Vital signs in last 24 hours: Temp:  [97.7 F (36.5 C)-98.7 F (37.1 C)] 97.7 F (36.5 C) (05/27 1232) Pulse Rate:  [69-75] 69 (05/27 1232) Resp:  [16-18] 18 (05/27 1232) BP: (134-148)/(71-79) 146/71 (05/27 1232) SpO2:  [95 %-98 %] 97 % (05/27 1232)  Physical Exam  Constitutional: He is oriented to person, place, and time. He appears well-developed and well-nourished. No distress.  HENT:  Mouth/Throat: Oropharynx is clear and moist. No oropharyngeal exudate.  Cardiovascular: Normal rate, regular rhythm and normal heart sounds. Exam reveals no gallop and no friction rub.  No murmur heard.  Pulmonary/Chest: Effort normal and breath sounds normal. No respiratory distress. He has no wheezes.  Abdominal: Soft. Bowel sounds are normal. Mildly distended abdomen Ext: 1.5inch red induration for piv site to right arm Lymphadenopathy:  He has no cervical adenopathy.  Neurological: He is alert and oriented to person, place, and time.  Skin: Skin is warm and dry. No rash noted. No erythema.  Psychiatric: He has a normal mood and affect. His behavior is normal.    Lab Results Recent Labs    09/15/23 0423  09/16/23 0406  WBC 5.4 6.1  HGB 13.7 14.2  HCT 42.7 44.1  NA 137 141  K 4.3 4.5  CL 104 107  CO2 24 25  BUN 20 24*  CREATININE 0.70 0.67   Liver Panel Recent Labs    09/15/23 0423 09/16/23 0406  ALBUMIN  2.9* 3.1*   Sedimentation Rate No results for input(s): "ESRSEDRATE" in the last 72 hours. C-Reactive Protein No results for input(s): "CRP" in the last 72 hours.  Microbiology: reviewed Studies/Results: No results found.   Assessment/Plan: Recurrent uti due to pseudomonas with secondary bacteremia = plan to treat for 14 days. Currently day 7. Recommend to get SP catheter before the conclusion of IV abtx. Continue on meropenem   Right piv phlebitis/induration = recommend to remove right piv and transition to midline for the remaining course of treatment  Long term abtx = currently tolerating dosing of meropenem . No dose adjustment needed  [facility] Pager: [phone]  09/16/2023, 6:30 PM

## 2023-09-16 NOTE — Plan of Care (Signed)

## 2023-09-16 NOTE — Progress Notes (Signed)
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
## 2023-09-17 ENCOUNTER — Inpatient Hospital Stay (HOSPITAL_COMMUNITY)

## 2023-09-17 ENCOUNTER — Ambulatory Visit (HOSPITAL_COMMUNITY)

## 2023-09-17 ENCOUNTER — Encounter (HOSPITAL_COMMUNITY): Payer: Self-pay | Admitting: Internal Medicine

## 2023-09-17 DIAGNOSIS — N319 Neuromuscular dysfunction of bladder, unspecified: Secondary | ICD-10-CM | POA: Diagnosis not present

## 2023-09-17 DIAGNOSIS — N390 Urinary tract infection, site not specified: Secondary | ICD-10-CM | POA: Diagnosis not present

## 2023-09-17 DIAGNOSIS — R7881 Bacteremia: Secondary | ICD-10-CM | POA: Diagnosis not present

## 2023-09-17 DIAGNOSIS — E038 Other specified hypothyroidism: Secondary | ICD-10-CM

## 2023-09-17 DIAGNOSIS — A419 Sepsis, unspecified organism: Secondary | ICD-10-CM | POA: Diagnosis not present

## 2023-09-17 DIAGNOSIS — G8221 Paraplegia, complete: Secondary | ICD-10-CM | POA: Diagnosis not present

## 2023-09-17 DIAGNOSIS — R972 Elevated prostate specific antigen [PSA]: Secondary | ICD-10-CM | POA: Diagnosis not present

## 2023-09-17 LAB — RENAL FUNCTION PANEL
Albumin: 3.2 g/dL — ABNORMAL LOW (ref 3.5–5.0)
Anion gap: 6 (ref 5–15)
BUN: 26 mg/dL — ABNORMAL HIGH (ref 8–23)
CO2: 26 mmol/L (ref 22–32)
Calcium: 9 mg/dL (ref 8.9–10.3)
Chloride: 105 mmol/L (ref 98–111)
Creatinine, Ser: 0.8 mg/dL (ref 0.61–1.24)
GFR, Estimated: >60 mL/min (ref >60)
Glucose, Bld: 127 mg/dL — ABNORMAL HIGH (ref 70–99)
Phosphorus: 3.1 mg/dL (ref 2.5–4.6)
Potassium: 4.1 mmol/L (ref 3.5–5.1)
Sodium: 137 mmol/L (ref 135–145)

## 2023-09-17 LAB — CBC
HCT: 41.7 % (ref 39.0–52.0)
Hemoglobin: 13.5 g/dL (ref 13.0–17.0)
MCH: 31.9 pg (ref 26.0–34.0)
MCHC: 32.4 g/dL (ref 30.0–36.0)
MCV: 98.6 fL (ref 80.0–100.0)
Platelets: 229 10*3/uL (ref 150–400)
RBC: 4.23 MIL/uL (ref 4.22–5.81)
RDW: 14.1 % (ref 11.5–15.5)
WBC: 6.5 10*3/uL (ref 4.0–10.5)
nRBC: 0 % (ref 0.0–0.2)

## 2023-09-17 LAB — GLUCOSE, CAPILLARY
Glucose-Capillary: 130 mg/dL — ABNORMAL HIGH (ref 70–99)
Glucose-Capillary: 159 mg/dL — ABNORMAL HIGH (ref 70–99)

## 2023-09-17 MED ORDER — MIDAZOLAM HCL 2 MG/2ML IJ SOLN
INTRAMUSCULAR | Status: AC
  Filled 2023-09-17: qty 4

## 2023-09-17 MED ORDER — FENTANYL CITRATE (PF) 100 MCG/2ML IJ SOLN
INTRAMUSCULAR | Status: AC
Start: 2023-09-17 — End: ?
  Filled 2023-09-17: qty 2

## 2023-09-17 MED ORDER — MIDAZOLAM HCL 2 MG/2ML IJ SOLN
INTRAMUSCULAR | Status: AC | PRN
  Administered 2023-09-17 (×2): 1 mg via INTRAVENOUS

## 2023-09-17 MED ORDER — SODIUM CHLORIDE 0.9 % IV SOLN
INTRAVENOUS | Status: AC
  Filled 2023-09-17: qty 500

## 2023-09-17 MED ORDER — FENTANYL CITRATE (PF) 100 MCG/2ML IJ SOLN
INTRAMUSCULAR | Status: AC | PRN
  Administered 2023-09-17 (×2): 50 ug via INTRAVENOUS

## 2023-09-17 NOTE — Procedures (Signed)
 Interventional Radiology Procedure:   Indications: Neurogenic bladder  Procedure: CT guided suprapubic catheter placement  Findings: 16 Fr tube successfully placed in bladder.  Complications: None     EBL: Minimal  Plan: Convert to balloon retention catheter in IR in 6 weeks  Gabrial Poppell R. Julietta Ogren, MD  Pager: [phone]
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
## 2023-09-17 NOTE — Plan of Care (Signed)
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
## 2023-09-17 NOTE — Assessment & Plan Note (Signed)
  Continue home regimen of Synthroid

## 2023-09-17 NOTE — Assessment & Plan Note (Signed)
 Sepsis and septic shock resolved Continuing intravenous antibiotics as above
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
## 2023-09-17 NOTE — Assessment & Plan Note (Signed)
 Clinically improving  Urine culture and blood culture growing out Pseudomonas aeruginosa continuing intravenous meropenem , day 8 of 14 Dr. Levern Reader with infectious disease following, her input is appreciated Currently receiving antibiotics through midline Home-going intravenous antibiotics via home health unfortunately not an option as wife is the sole caregiver and is unable to continue to care for this patient Attempted to look at other options for disposition

## 2023-09-17 NOTE — Plan of Care (Signed)
   Problem: Clinical Measurements: Goal: Ability to maintain clinical measurements within normal limits will improve Outcome: Progressing Goal: Will remain free from infection Outcome: Progressing Goal: Diagnostic test results will improve Outcome: Progressing Goal: Respiratory complications will improve Outcome: Progressing

## 2023-09-17 NOTE — Sedation Documentation (Addendum)
 300cc saline injected into bladder via foley per MD

## 2023-09-17 NOTE — Assessment & Plan Note (Signed)
 IR has placed new suprapubic catheter today

## 2023-09-17 NOTE — TOC Progression Note (Signed)
 Transition of Care [facility]) - Progression Note    Patient Details  Name: Edward Waller MRN: [id number] Date of Birth: 1932/05/15  [facility] CM/SW Contact  Cheria Sadiq, Greggory Learn, RN Phone Number:   Clinical Narrative:    CM received call from Nichola Barges granddaughter [phone]) states she is his HCPOA. CM confirmed information via face to face with patient and wife also information is on record. [employer]isa App is requesting we completed a FL2. This will allow her to completed process for medicaid. Patient, wife and granddaughter request that patient go to LTC upon discharge.  CM gather required documents for [facility] to [facility].   Expected Discharge Plan: Home/Self Care Barriers to Discharge: Continued Medical Work up  Expected Discharge Plan and Services   Discharge Planning Services: CM Consult   Living arrangements for the past 2 months: Single Family Home                                       Social Determinants of Health (SDOH) Interventions SDOH Screenings   Food Insecurity: No Food Insecurity (09/14/2023)  Housing: Low Risk  (09/14/2023)  Transportation Needs: Unmet Transportation Needs (09/14/2023)  Utilities: Not At Risk (09/14/2023)  Depression (PHQ2-9): Low Risk  (05/16/2020)  Social Connections: Socially Integrated (09/10/2023)  Tobacco Use: Low Risk  (09/13/2023)    Readmission Risk Interventions    09/14/2023    4:46 PM 07/22/2023    2:34 PM  Readmission Risk Prevention Plan  Transportation Screening Complete Complete  PCP or Specialist Appt within 3-5 Days Complete Complete  HRI or Home Care Consult Complete Complete  Social Work Consult for Recovery Care Planning/Counseling Complete Complete  Palliative Care Screening Not Applicable Not Applicable  Medication Review Oceanographer) Complete Complete

## 2023-09-17 NOTE — Progress Notes (Addendum)
    [facility]    Date of Admission:  09/10/2023   Total days of antibiotics 8   ID: Edward Waller is a 89 y.o. male with  MDRO- PsA complicated UTI and bacteremia Principal Problem:   Septic shock (HCC) Active Problems:   Hypothyroidism   BPH (benign prostatic hyperplasia)   Complete paraplegia (HCC)   Neurogenic bladder   UTI (urinary tract infection)    Subjective: Afebrile. Underwent SP catheter placement by IR without issue.   Medications:   baclofen   25 mg Oral QHS   bisacodyl   10 mg Rectal Q1400   Chlorhexidine  Gluconate Cloth  6 each Topical Daily   [START ON 09/18/2023] enoxaparin  (LOVENOX ) injection  40 mg Subcutaneous Q24H   levothyroxine   50 mcg Oral QAC breakfast   senna-docusate  2 tablet Oral Q0600   sodium chloride  flush  10-40 mL Intracatheter Q12H    Objective: Vital signs in last 24 hours: Temp:  [97.6 F (36.4 C)-98.5 F (36.9 C)] 97.6 F (36.4 C) (05/28 1153) Pulse Rate:  [63-84] 63 (05/28 1153) Resp:  [8-18] 16 (05/28 1153) BP: (97-152)/(61-90) 122/74 (05/28 1153) SpO2:  [93 %-99 %] 94 % (05/28 1153) Physical Exam  Constitutional: He is oriented to person, place, and time. He appears well-developed and well-nourished. No distress.  HENT:  Mouth/Throat: Oropharynx is clear and moist. No oropharyngeal exudate.  Cardiovascular: Normal rate, regular rhythm and normal heart sounds. Exam reveals no gallop and no friction rub.  No murmur heard.  Pulmonary/Chest: Effort normal and breath sounds normal. No respiratory distress. He has no wheezes.  Abdominal: Soft. Bowel sounds are normal. He exhibits no distension. There is no tenderness. SP catheter in place - clear urine in collection system. Lymphadenopathy:  He has no cervical adenopathy.  Neurological: He is alert and oriented to person, place, and time.  Skin: Skin is warm and dry. No rash noted. No erythema.  Psychiatric: He has a normal mood and affect. His behavior  is normal.    Lab Results Recent Labs    09/16/23 0406 09/17/23 0237  WBC 6.1 6.5  HGB 14.2 13.5  HCT 44.1 41.7  NA 141 137  K 4.5 4.1  CL 107 105  CO2 25 26  BUN 24* 26*  CREATININE 0.67 0.80   Liver Panel Recent Labs    09/16/23 0406 09/17/23 0237  ALBUMIN  3.1* 3.2*   Microbiology:   Pseudomonas aeruginosa    MIC    CEFTAZIDIME 16 INTERMED... Intermediate    CIPROFLOXACIN  2 RESISTANT Resistant    GENTAMICIN <=1 SENSITIVE Sensitive    IMIPENEM 1 SENSITIVE Sensitive    Studies/Results: No results found.   Assessment/Plan: MDR-pseudomonal complicated uti with secondary bacteremia = currently on day 8 of 14. Continue on meropenem  through midline. Placement maybe an issue since he is a paraplegic, consider keeping here to complete course, since he has had frequent readmission. He previously was cared by his wife who is also an octogenerian.   Can pull midline once abtx are complete. June3 rd is last day of abtx  Piv related phlebitis to right fore arm = appears improving.  Neurogenic bladder s/p SP catheter placement = patient will need education on how to use and clean SP catheter. Defer to IR and Urology for next steps and follow up  Will sign off.  [facility] for Infectious Diseases Pager: [phone]  09/17/2023, 2:59 PM
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
## 2023-09-18 DIAGNOSIS — E038 Other specified hypothyroidism: Secondary | ICD-10-CM | POA: Diagnosis not present

## 2023-09-18 DIAGNOSIS — G8221 Paraplegia, complete: Secondary | ICD-10-CM | POA: Diagnosis not present

## 2023-09-18 DIAGNOSIS — A419 Sepsis, unspecified organism: Secondary | ICD-10-CM | POA: Diagnosis not present

## 2023-09-18 DIAGNOSIS — N319 Neuromuscular dysfunction of bladder, unspecified: Secondary | ICD-10-CM | POA: Diagnosis not present

## 2023-09-18 LAB — CBC WITH DIFFERENTIAL/PLATELET
Abs Immature Granulocytes: 0.08 10*3/uL — ABNORMAL HIGH (ref 0.00–0.07)
Basophils Absolute: 0.1 10*3/uL (ref 0.0–0.1)
Basophils Relative: 1 %
Eosinophils Absolute: 0.2 10*3/uL (ref 0.0–0.5)
Eosinophils Relative: 3 %
HCT: 41.6 % (ref 39.0–52.0)
Hemoglobin: 13.2 g/dL (ref 13.0–17.0)
Immature Granulocytes: 1 %
Lymphocytes Relative: 26 %
Lymphs Abs: 1.8 10*3/uL (ref 0.7–4.0)
MCH: 31 pg (ref 26.0–34.0)
MCHC: 31.7 g/dL (ref 30.0–36.0)
MCV: 97.7 fL (ref 80.0–100.0)
Monocytes Absolute: 0.4 10*3/uL (ref 0.1–1.0)
Monocytes Relative: 7 %
Neutro Abs: 4.2 10*3/uL (ref 1.7–7.7)
Neutrophils Relative %: 62 %
Platelets: 205 10*3/uL (ref 150–400)
RBC: 4.26 MIL/uL (ref 4.22–5.81)
RDW: 14.5 % (ref 11.5–15.5)
WBC: 6.7 10*3/uL (ref 4.0–10.5)
nRBC: 0 % (ref 0.0–0.2)

## 2023-09-18 LAB — COMPREHENSIVE METABOLIC PANEL WITH GFR
ALT: 20 U/L (ref 0–44)
AST: 19 U/L (ref 15–41)
Albumin: 3.2 g/dL — ABNORMAL LOW (ref 3.5–5.0)
Alkaline Phosphatase: 68 U/L (ref 38–126)
Anion gap: 6 (ref 5–15)
BUN: 30 mg/dL — ABNORMAL HIGH (ref 8–23)
CO2: 26 mmol/L (ref 22–32)
Calcium: 8.7 mg/dL — ABNORMAL LOW (ref 8.9–10.3)
Chloride: 102 mmol/L (ref 98–111)
Creatinine, Ser: 0.63 mg/dL (ref 0.61–1.24)
GFR, Estimated: >60 mL/min (ref >60)
Glucose, Bld: 130 mg/dL — ABNORMAL HIGH (ref 70–99)
Potassium: 4.2 mmol/L (ref 3.5–5.1)
Sodium: 134 mmol/L — ABNORMAL LOW (ref 135–145)
Total Bilirubin: 0.6 mg/dL (ref 0.0–1.2)
Total Protein: 7.3 g/dL (ref 6.5–8.1)

## 2023-09-18 LAB — GLUCOSE, CAPILLARY
Glucose-Capillary: 109 mg/dL — ABNORMAL HIGH (ref 70–99)
Glucose-Capillary: 95 mg/dL (ref 70–99)

## 2023-09-18 LAB — MAGNESIUM: Magnesium: 2.4 mg/dL (ref 1.7–2.4)

## 2023-09-18 NOTE — Assessment & Plan Note (Addendum)
 Secondary to paraplegia  Intermittent catheterization in the past IR has placed new suprapubic catheter 5/28

## 2023-09-18 NOTE — Plan of Care (Signed)

## 2023-09-18 NOTE — Assessment & Plan Note (Addendum)
 Continuing to clinically improve Urine culture and blood culture growing out Pseudomonas aeruginosa continuing intravenous meropenem , day 9 of 14 Dr. Levern Reader with infectious disease following, signed off on 5/28. Currently receiving antibiotics through midline Home-going intravenous antibiotics via home health unfortunately not an option as wife is the sole caregiver and is unable to continue to care for this patient Attempting to look for alternative dispositions with TOC

## 2023-09-18 NOTE — Assessment & Plan Note (Signed)
  Continue home regimen of Synthroid
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
## 2023-09-18 NOTE — Assessment & Plan Note (Signed)
 Sepsis and septic shock resolved Continuing intravenous antibiotics as above

## 2023-09-19 DIAGNOSIS — A419 Sepsis, unspecified organism: Secondary | ICD-10-CM | POA: Diagnosis not present

## 2023-09-19 DIAGNOSIS — G8221 Paraplegia, complete: Secondary | ICD-10-CM | POA: Diagnosis not present

## 2023-09-19 DIAGNOSIS — N400 Enlarged prostate without lower urinary tract symptoms: Secondary | ICD-10-CM | POA: Diagnosis not present

## 2023-09-19 DIAGNOSIS — N319 Neuromuscular dysfunction of bladder, unspecified: Secondary | ICD-10-CM | POA: Diagnosis not present

## 2023-09-19 LAB — GLUCOSE, CAPILLARY: Glucose-Capillary: 106 mg/dL — ABNORMAL HIGH (ref 70–99)
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
## 2023-09-19 NOTE — Assessment & Plan Note (Signed)
 Sepsis and septic shock resolved Continuing intravenous antibiotics as above

## 2023-09-19 NOTE — Assessment & Plan Note (Signed)
 Continuing to clinically improve Urine culture and blood culture growing out Pseudomonas aeruginosa continuing intravenous meropenem , day 10 of 14 Dr. Levern Reader with infectious disease has provided recommendations for treatment, signed off on 5/28. Currently receiving antibiotics through midline Planning to provide patient with intravenous antibiotics via midline at time of discharge to SNF Anticipate discharge to [facility] on 6/2.

## 2023-09-19 NOTE — Assessment & Plan Note (Signed)
  Continue home regimen of Synthroid

## 2023-09-19 NOTE — TOC Progression Note (Addendum)
 Transition of Care [facility]) - Progression Note    Patient Details  Name: Edward Waller MRN: [id number] Date of Birth: 1932-11-26  [facility] CM/SW Contact  Kathryn Parish, RN Phone Number: 09/19/2023, 10:10 AM  Clinical Narrative:    [facility] left message to see if referral packet has been received. 3:30 PM CM spoke with April ([place]). Patients established with [name],  Dr. Gaetano Jordan, SW [phone]  or Barnett Booty, SW [phone] Spoke with [facility] and spouse and are agreeable to SNF at [facility] 4:00 PM Patient has been accepted at [facility] in [place], [facility] [address] [employer] [address] Nurse to call report to [phone] ext [phone] Must arrive on Monday by 10 am.  Lyna Sandhoff Sharin David) has been called for a 8:00 AM pick-up on Monday.    Expected Discharge Plan: Home/Self Care Barriers to Discharge: Continued Medical Work up  Expected Discharge Plan and Services   Discharge Planning Services: CM Consult   Living arrangements for the past 2 months: Single Family Home                                       Social Determinants of Health (SDOH) Interventions SDOH Screenings   Food Insecurity: No Food Insecurity (09/14/2023)  Housing: Low Risk  (09/14/2023)  Transportation Needs: Unmet Transportation Needs (09/14/2023)  Utilities: Not At Risk (09/14/2023)  Depression (PHQ2-9): Low Risk  (05/16/2020)  Social Connections: Socially Integrated (09/10/2023)  Tobacco Use: Low Risk  (09/13/2023)    Readmission Risk Interventions    09/14/2023    4:46 PM 07/22/2023    2:34 PM  Readmission Risk Prevention Plan  Transportation Screening Complete Complete  PCP or Specialist Appt within 3-5 Days Complete Complete  HRI or Home Care Consult Complete Complete  Social Work Consult for Recovery Care Planning/Counseling Complete Complete  Palliative Care Screening Not Applicable Not  Applicable  Medication Review Oceanographer) Complete Complete
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
## 2023-09-19 NOTE — Plan of Care (Signed)
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
## 2023-09-20 DIAGNOSIS — N400 Enlarged prostate without lower urinary tract symptoms: Secondary | ICD-10-CM | POA: Diagnosis not present

## 2023-09-20 DIAGNOSIS — G8221 Paraplegia, complete: Secondary | ICD-10-CM | POA: Diagnosis not present

## 2023-09-20 DIAGNOSIS — N319 Neuromuscular dysfunction of bladder, unspecified: Secondary | ICD-10-CM | POA: Diagnosis not present

## 2023-09-20 DIAGNOSIS — A419 Sepsis, unspecified organism: Secondary | ICD-10-CM | POA: Diagnosis not present

## 2023-09-20 LAB — CBC WITH DIFFERENTIAL/PLATELET
Abs Immature Granulocytes: 0.05 10*3/uL (ref 0.00–0.07)
Basophils Absolute: 0 10*3/uL (ref 0.0–0.1)
Basophils Relative: 0 %
Eosinophils Absolute: 0.3 10*3/uL (ref 0.0–0.5)
Eosinophils Relative: 4 %
HCT: 40.2 % (ref 39.0–52.0)
Hemoglobin: 13 g/dL (ref 13.0–17.0)
Immature Granulocytes: 1 %
Lymphocytes Relative: 23 %
Lymphs Abs: 1.4 10*3/uL (ref 0.7–4.0)
MCH: 31.2 pg (ref 26.0–34.0)
MCHC: 32.3 g/dL (ref 30.0–36.0)
MCV: 96.4 fL (ref 80.0–100.0)
Monocytes Absolute: 0.5 10*3/uL (ref 0.1–1.0)
Monocytes Relative: 7 %
Neutro Abs: 4.2 10*3/uL (ref 1.7–7.7)
Neutrophils Relative %: 65 %
Platelets: 207 10*3/uL (ref 150–400)
RBC: 4.17 MIL/uL — ABNORMAL LOW (ref 4.22–5.81)
RDW: 14.2 % (ref 11.5–15.5)
WBC: 6.4 10*3/uL (ref 4.0–10.5)
nRBC: 0 % (ref 0.0–0.2)

## 2023-09-20 LAB — COMPREHENSIVE METABOLIC PANEL WITH GFR
ALT: 17 U/L (ref 0–44)
AST: 17 U/L (ref 15–41)
Albumin: 3.1 g/dL — ABNORMAL LOW (ref 3.5–5.0)
Alkaline Phosphatase: 67 U/L (ref 38–126)
Anion gap: 7 (ref 5–15)
BUN: 29 mg/dL — ABNORMAL HIGH (ref 8–23)
CO2: 24 mmol/L (ref 22–32)
Calcium: 9 mg/dL (ref 8.9–10.3)
Chloride: 105 mmol/L (ref 98–111)
Creatinine, Ser: 0.69 mg/dL (ref 0.61–1.24)
GFR, Estimated: >60 mL/min (ref >60)
Glucose, Bld: 118 mg/dL — ABNORMAL HIGH (ref 70–99)
Potassium: 4.1 mmol/L (ref 3.5–5.1)
Sodium: 136 mmol/L (ref 135–145)
Total Bilirubin: 0.6 mg/dL (ref 0.0–1.2)
Total Protein: 7.3 g/dL (ref 6.5–8.1)

## 2023-09-20 LAB — GLUCOSE, CAPILLARY
Glucose-Capillary: 135 mg/dL — ABNORMAL HIGH (ref 70–99)
Glucose-Capillary: 122 mg/dL — ABNORMAL HIGH (ref 70–99)

## 2023-09-20 LAB — MAGNESIUM: Magnesium: 2.2 mg/dL (ref 1.7–2.4)

## 2023-09-20 NOTE — Assessment & Plan Note (Addendum)
 Continuing to clinically improve Urine culture and blood culture growing out Pseudomonas aeruginosa continuing intravenous meropenem , day 11 of 14 Dr. Levern Reader with infectious disease has provided recommendations for treatment, signed off on 5/28. Currently receiving antibiotics through midline Planning to provide patient with intravenous antibiotics via midline at time of discharge  Anticipate discharge to [facility] on 6/2.
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
## 2023-09-20 NOTE — Assessment & Plan Note (Addendum)
  Continue home regimen of Synthroid

## 2023-09-20 NOTE — Assessment & Plan Note (Addendum)
 Sepsis and septic shock resolved Continuing intravenous antibiotics as above

## 2023-09-20 NOTE — Plan of Care (Signed)
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
## 2023-09-20 NOTE — Assessment & Plan Note (Addendum)
 Secondary to paraplegia  Intermittent catheterization in the past IR has placed new suprapubic catheter 5/28

## 2023-09-21 DIAGNOSIS — N319 Neuromuscular dysfunction of bladder, unspecified: Secondary | ICD-10-CM | POA: Diagnosis not present

## 2023-09-21 DIAGNOSIS — N400 Enlarged prostate without lower urinary tract symptoms: Secondary | ICD-10-CM | POA: Diagnosis not present

## 2023-09-21 DIAGNOSIS — G8221 Paraplegia, complete: Secondary | ICD-10-CM | POA: Diagnosis not present

## 2023-09-21 DIAGNOSIS — A419 Sepsis, unspecified organism: Secondary | ICD-10-CM | POA: Diagnosis not present

## 2023-09-21 LAB — GLUCOSE, CAPILLARY
Glucose-Capillary: 128 mg/dL — ABNORMAL HIGH (ref 70–99)
Glucose-Capillary: 136 mg/dL — ABNORMAL HIGH (ref 70–99)
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
## 2023-09-21 NOTE — Assessment & Plan Note (Signed)
  Continue home regimen of Synthroid
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
## 2023-09-21 NOTE — Plan of Care (Signed)

## 2023-09-21 NOTE — Assessment & Plan Note (Signed)
 Sepsis and septic shock resolved Continuing intravenous antibiotics as above

## 2023-09-21 NOTE — Assessment & Plan Note (Signed)
 Continuing to clinically improve Urine culture and blood culture growing out Pseudomonas aeruginosa continuing intravenous meropenem , day 12 of 14 Dr. Levern Reader with infectious disease has provided recommendations for treatment, signed off on 5/28. Currently receiving antibiotics through midline Planning to provide patient with intravenous antibiotics via midline at time of discharge  Anticipate discharge morning of 6/2 to [facility] community living center.

## 2023-09-22 DIAGNOSIS — A419 Sepsis, unspecified organism: Secondary | ICD-10-CM | POA: Diagnosis not present

## 2023-09-22 DIAGNOSIS — H6121 Impacted cerumen, right ear: Secondary | ICD-10-CM | POA: Insufficient documentation

## 2023-09-22 DIAGNOSIS — R6521 Severe sepsis with septic shock: Secondary | ICD-10-CM | POA: Diagnosis not present

## 2023-09-22 MED ORDER — CARBAMIDE PEROXIDE 6.5 % OT SOLN
5.0000 [drp] | Freq: Two times a day (BID) | OTIC | Status: DC
  Administered 2023-09-22: 5 [drp] via OTIC
  Filled 2023-09-22: qty 15

## 2023-09-22 MED ORDER — CARBAMIDE PEROXIDE 6.5 % OT SOLN: 5.0000 [drp] | Freq: Two times a day (BID) | OTIC | Status: AC

## 2023-09-22 MED ORDER — SODIUM CHLORIDE 0.9 % IV SOLN: 1.0000 g | Freq: Three times a day (TID) | INTRAVENOUS | Status: AC

## 2023-09-22 NOTE — Plan of Care (Signed)

## 2023-09-22 NOTE — TOC Transition Note (Signed)
 Transition of Care [facility]) - Discharge Note   Patient Details  Name: Edward Waller MRN: [id number] Date of Birth: 1932-12-31  Transition of Care [facility]) CM/SW Contact:  Kathryn Parish, RN Phone Number: 09/22/2023, 9:10 AM   Clinical Narrative:     Patient [facility] DC to:[facility], [address], [place], [place], [place] DC date: 09/22/2023 Family notified: [place]  and Risk analyst [id number]   Per MD patient ready for DC to . RN to call report prior to discharge [phone]. [phone]. RN, patient, patient's family, and facility notified of DC. Discharge Summary, AVS, FL2 placed in DC packet. Ambulance [facility]) transport requested for patient.   CM signing off. Please consult us  again if new needs arise.    Final next level of care: Skilled Nursing Facility [facility] [address], [place], [place]) Barriers to Discharge: Barriers Resolved   Patient Goals and CMS Choice Patient states their goals for this hospitalization and ongoing recovery are:: SNF CMS Medicare.gov Compare Post Acute Care list provided to:: Patient Choice offered to / list presented to : NA [place] ownership interest in [facility].provided to:: Parent NA    Discharge Placement                Patient to be transferred to facility by: [facility] Name of family member notified: Virigina and Alisa App Patient and family notified of of transfer: 09/22/23  Discharge Plan and Services Additional resources added to the After Visit Summary for     Discharge Planning Services: CM Consult            DME Arranged: N/A DME Agency: NA       HH Arranged: NA HH Agency: NA        Social Drivers of Health (SDOH) Interventions SDOH Screenings   Food Insecurity: No Food Insecurity (09/14/2023)  Housing: Low Risk  (09/14/2023)  Transportation Needs: Unmet Transportation Needs (09/14/2023)  Utilities: Not At Risk (09/14/2023)  Depression  (PHQ2-9): Low Risk  (05/16/2020)  Social Connections: Socially Integrated (09/10/2023)  Tobacco Use: Low Risk  (09/13/2023)     Readmission Risk Interventions    09/14/2023    4:46 PM 07/22/2023    2:34 PM  Readmission Risk Prevention Plan  Transportation Screening Complete Complete  PCP or Specialist Appt within 3-5 Days Complete Complete  HRI or Home Care Consult Complete Complete  Social Work Consult for Recovery Care Planning/Counseling Complete Complete  Palliative Care Screening Not Applicable Not Applicable  Medication Review Oceanographer) Complete Complete

## 2023-09-22 NOTE — Progress Notes (Signed)
 Report given Tonette Franco RN.

## 2023-09-22 NOTE — Discharge Instructions (Signed)
 Patient currently has a midline.  This midline may be removed once the patient's intravenous antibiotic course is complete at the end of the day on 6/3 or morning of 6/4.   Patient is full code  Patient is on bedrest but may get out of bed to wheelchair or chair with complete assist and participation with PT/OT Patient to be ministered all prescribed medications exactly as instructed per the attached medication reconciliation. Patient receives a low-sodium low carbohydrate diet Follow-up with facility provider per protocol.   Please bring patient back to the emergency department if he develops sudden change in mentation, shortness of breath,  fevers of greater than 100.4 F or weakness.

## 2023-09-22 NOTE — Discharge Summary (Signed)
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 input(s): "LIPASE", "AMYLASE" in the last 168 hours. No results for input(s): "AMMONIA" in the last 168 hours. CBC: Recent Labs  Lab 09/16/23 0406 09/17/23 0237 09/18/23 0413 09/20/23 0223  WBC 6.1 6.5 6.7 6.4  NEUTROABS  --   --  4.2 4.2  HGB 14.2 13.5 13.2 13.0  HCT 44.1 41.7 41.6 40.2  MCV 98.2 98.6 97.7 96.4  PLT 197 229 205 207   Cardiac Enzymes: No results for input(s): "CKTOTAL", "CKMB", "CKMBINDEX", "TROPONINI" in the last 168 hours. BNP: Invalid input(s): "POCBNP" CBG: Recent Labs  Lab 09/19/23 0746 09/20/23 0806 09/20/23 1640 09/21/23 0802 09/21/23 1626  GLUCAP 106* 135* 122* 128* 136*   D-Dimer No results for input(s): "DDIMER" in the last 72 hours. Hgb A1c No results for input(s): "HGBA1C" in the last 72 hours. Lipid Profile No results for input(s): "CHOL", "HDL", "LDLCALC", "TRIG", "CHOLHDL", "LDLDIRECT" in the last 72 hours. Thyroid function studies No results for input(s): "TSH",  "T4TOTAL", "T3FREE", "THYROIDAB" in the last 72 hours.  Invalid input(s): "FREET3" Anemia work up No results for input(s): "VITAMINB12", "FOLATE", "FERRITIN", "TIBC", "IRON", "RETICCTPCT" in the last 72 hours. Urinalysis    Component Value Date/Time   COLORURINE YELLOW 09/10/2023 0645   APPEARANCEUR CLOUDY (A) 09/10/2023 0645   LABSPEC 1.012 09/10/2023 0645   PHURINE 5.0 09/10/2023 0645   GLUCOSEU NEGATIVE 09/10/2023 0645   HGBUR LARGE (A) 09/10/2023 0645   BILIRUBINUR NEGATIVE 09/10/2023 0645   KETONESUR NEGATIVE 09/10/2023 0645   PROTEINUR 30 (A) 09/10/2023 0645   NITRITE NEGATIVE 09/10/2023 0645   LEUKOCYTESUR LARGE (A) 09/10/2023 0645   Sepsis Labs Recent Labs  Lab 09/16/23 0406 09/17/23 0237 09/18/23 0413 09/20/23 0223  WBC 6.1 6.5 6.7 6.4   Microbiology No results found for this or any previous visit (from the past 240 hours).   Time coordinating discharge: 35 minutes  SIGNED:   Vada Garibaldi, MD  [facility] 09/22/2023, 8:43 AM

## 2023-09-22 NOTE — Discharge Summary (Incomplete)
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 Requests   Final    BOTTLES DRAWN AEROBIC AND ANAEROBIC Blood Culture results may not be optimal due to an inadequate volume of blood received in culture bottles Performed at [facility], [address] [place], [place]  [address]    Culture   Final    NO GROWTH 5 DAYS Performed at [facility], [address], [place], [place] [address]    Report Status 09/15/2023 FINAL  Final  MRSA Next Gen by PCR, Nasal     Status: None   Collection Time: 09/10/23 11:41 AM   Specimen: Nasal Mucosa; Nasal Swab  Result Value Ref Range Status   MRSA by PCR Next Gen NOT DETECTED NOT DETECTED Final    Comment: (NOTE) The GeneXpert MRSA Assay (FDA approved for NASAL specimens only), is one component of a comprehensive MRSA colonization surveillance program. It is not intended to diagnose MRSA infection nor to guide or monitor treatment for MRSA infections. Test performance is not FDA approved in patients less than 86  years old. Performed at [facility], [address] [place], [place] [address]     Labs: CBC: Recent Labs  Lab 09/15/23 0423 09/16/23 0406 09/17/23 0237 09/18/23 0413 09/20/23 0223  WBC 5.4 6.1 6.5 6.7 6.4  NEUTROABS  --   --   --  4.2 4.2  HGB 13.7 14.2 13.5 13.2 13.0  HCT 42.7 44.1 41.7 41.6 40.2  MCV 98.8 98.2 98.6 97.7 96.4  PLT 205 197 229 205 207   Basic Metabolic Panel: Recent Labs  Lab 09/15/23 0423 09/16/23 0406 09/17/23 0237 09/18/23 0413 09/20/23 0223  NA 137 141 137 134* 136  K 4.3 4.5 4.1 4.2 4.1  CL 104 107 105 102 105  CO2 24 25 26 26 24   GLUCOSE 126* 133* 127* 130* 118*  BUN 20 24* 26* 30* 29*  CREATININE 0.70 0.67 0.80 0.63 0.69  CALCIUM  8.9 9.4 9.0 8.7* 9.0  MG  --   --   --  2.4 2.2  PHOS 3.2 3.0 3.1  --   --    Liver Function Tests: Recent Labs  Lab 09/15/23 0423 09/16/23 0406 09/17/23 0237 09/18/23 0413 09/20/23 0223  AST  --   --   --  19 17  ALT  --   --   --  20 17  ALKPHOS  --   --   --  68 67  BILITOT  --   --   --  0.6 0.6  PROT  --   --   --  7.3 7.3  ALBUMIN  2.9* 3.1* 3.2* 3.2* 3.1*   CBG: Recent Labs  Lab 09/19/23 0746 09/20/23 0806 09/20/23 1640 09/21/23 0802 09/21/23 1626  GLUCAP 106* 135* 122* 128* 136*    Discharge time spent: greater than 30 minutes.  Signed: True Fuss, MD [facility] 09/22/2023

## 2023-09-22 NOTE — Assessment & Plan Note (Signed)
 Patient reporting fullness and difficulty hearing in the right ear. The wife, patient has frequent occurrences of impacted cerumen in the past Unfortunately, no otoscope/speculum is available to perform otoscopic examination Will place patient on Debrox drops, monitor for symptomatic improvement.

## 2023-09-22 NOTE — NC FL2 (Signed)
 [place]  MEDICAID FL2 LEVEL OF CARE FORM     IDENTIFICATION  Patient Name: Edward Waller Birthdate: [birth year] Sex: male Admission Date (Current Location): 09/10/2023  [facility] and [name] Number:  Producer, television/film/video and Address:  [facility],  [address], [place] [address]      Provider Number: [id number]  Attending Physician Name and Address:  Vada Garibaldi, MD  Relative Name and Phone Number:  Nichola Barges  [phone] (granddaughter) HCPOA    Current Level of Care: Hospital Recommended Level of Care: Nursing Facility Prior Approval Number:    Date Approved/Denied:   PASRR Number: [id number]  Discharge Plan: SNF    Current Diagnoses: Patient Active Problem List   Diagnosis Date Noted   Excessive cerumen in right ear canal 09/22/2023   Complicated UTI (urinary tract infection) 09/11/2023   Bacteremia due to Pseudomonas 08/27/2023   Ureteral calculus 07/21/2023   Elevated troponin 07/21/2023   Neurogenic bladder 07/21/2023   Septic shock (HCC) 07/21/2023   History of DVT (deep vein thrombosis) 07/21/2023   Non-insulin  dependent type 2 diabetes mellitus (HCC) 07/20/2023   Malnutrition of moderate degree 01/24/2023   Complete paraplegia (HCC) 01/10/2023   Adjustment disorder 01/04/2023   Subluxation of T8-T9 thoracic vertebra 01/03/2023   T8 vertebral fracture (HCC) 01/02/2023   OA (osteoarthritis) of hip 03/20/2022   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   Erythema of wound 07/25/2021   Trauma 07/12/2021   Leukocytosis 07/07/2021   Right tibial and fibular fracture 07/06/2021   Sternal fracture 07/06/2021   Orbital fracture (HCC) 07/06/2021   Elevated blood pressure reading 07/06/2021   Encounter to establish care 05/16/2020   Hypothyroidism 05/16/2020   BPH (benign prostatic hyperplasia) 05/16/2020   Closed left ankle fracture 05/16/2020   Restless legs 05/16/2020   Mass of right lower leg 05/16/2020    Orientation RESPIRATION  BLADDER Height & Weight     Self, Time, Situation, Place  Normal Incontinent Weight: 79.4 kg (pt moved to new bed) Height:  6' (182.9 cm)  BEHAVIORAL SYMPTOMS/MOOD NEUROLOGICAL BOWEL NUTRITION STATUS      Incontinent Diet (Regular)  AMBULATORY STATUS COMMUNICATION OF NEEDS Skin   Total Care Verbally Normal                       Personal Care Assistance Level of Assistance  Bathing, Feeding, Dressing Bathing Assistance: Limited assistance Feeding assistance: Limited assistance Dressing Assistance: Limited assistance     Functional Limitations Info  Sight, Hearing, Speech Sight Info: Adequate Hearing Info: Impaired Speech Info: Adequate    SPECIAL CARE FACTORS FREQUENCY                       Contractures Contractures Info: Not present    Additional Factors Info  Code Status, Allergies Code Status Info: FULL Allergies Info: Trazodone  And Nefazodone           Current Medications (09/22/2023):  This is the current hospital active medication list Current Facility-Administered Medications  Medication Dose Route Frequency Provider Last Rate Last Admin   acetaminophen  (TYLENOL ) tablet 650 mg  650 mg Oral Q6H PRN Jannette Mend, Mir M, MD       Or   acetaminophen  (TYLENOL ) suppository 650 mg  650 mg Rectal Q6H PRN Jannette Mend, Mir M, MD       albuterol  (PROVENTIL ) (2.5 MG/3ML) 0.083% nebulizer solution 2.5 mg  2.5 mg Nebulization Q2H PRN Gaylin Ke, MD  baclofen  (LIORESAL ) tablet 25 mg  25 mg Oral QHS Jannette Mend, Mir M, MD   25 mg at 09/21/23 2217   bisacodyl  (DULCOLAX) suppository 10 mg  10 mg Rectal Daily PRN Chavez, Abigail, NP   10 mg at 09/10/23 2120   bisacodyl  (DULCOLAX) suppository 10 mg  10 mg Rectal Q1400 Rai, Ripudeep K, MD   10 mg at 09/21/23 2217   carbamide peroxide (DEBROX) 6.5 % OTIC (EAR) solution 5 drop  5 drop Right EAR BID Shalhoub, Merrill Abide, MD   5 drop at 09/22/23 0541   Chlorhexidine  Gluconate Cloth 2 % PADS 6 each  6 each Topical Daily  Jannette Mend, Mir M, MD   6 each at 09/21/23 1141   enoxaparin  (LOVENOX ) injection 40 mg  40 mg Subcutaneous Q24H Allred, Darrell K, PA-C   40 mg at 09/21/23 9147   fluticasone  (FLONASE ) 50 MCG/ACT nasal spray 1 spray  1 spray Each Nare Daily PRN Jannette Mend, Mir M, MD       levothyroxine  (SYNTHROID ) tablet 50 mcg  50 mcg Oral QAC breakfast Jannette Mend, Mir M, MD   50 mcg at 09/22/23 0539   melatonin tablet 3-6 mg  3-6 mg Oral QHS PRN Jannette Mend, Mir M, MD   6 mg at 09/20/23 0048   meropenem  (MERREM ) 1 g in sodium chloride  0.9 % 100 mL IVPB  1 g Intravenous Q8H Liane Redman, MD 200 mL/hr at 09/22/23 0540 1 g at 09/22/23 0540   ondansetron  (ZOFRAN ) tablet 4 mg  4 mg Oral Q6H PRN Jannette Mend, Mir M, MD       Or   ondansetron  (ZOFRAN ) injection 4 mg  4 mg Intravenous Q6H PRN Jannette Mend, Mir M, MD       Oral care mouth rinse  15 mL Mouth Rinse PRN Jannette Mend, Mir M, MD       polyethylene glycol (MIRALAX  / GLYCOLAX ) packet 34 g  34 g Oral Daily PRN Jannette Mend, Mir M, MD       senna-docusate (Senokot-S) tablet 2 tablet  2 tablet Oral Q0600 Jannette Mend, Mir M, MD   2 tablet at 09/21/23 8295   sodium chloride  (OCEAN) 0.65 % nasal spray 1 spray  1 spray Each Nare PRN Rai, Ripudeep K, MD   1 spray at 09/11/23 1827   sodium chloride  flush (NS) 0.9 % injection 10-40 mL  10-40 mL Intracatheter Q12H Rai, Ripudeep K, MD   10 mL at 09/21/23 2218   sodium chloride  flush (NS) 0.9 % injection 10-40 mL  10-40 mL Intracatheter PRN Rai, Hurman Maiden, MD         Discharge Medications: Please see discharge summary for a list of discharge medications. Medication List       STOP taking these medications     metoprolol  succinate 25 MG 24 hr tablet Commonly known as: TOPROL -XL    miconazole 2 % powder Commonly known as: MICOTIN    mirabegron  ER 50 MG Tb24 tablet Commonly known as: MYRBETRIQ     multivitamin with minerals Tabs tablet    solifenacin 5 MG tablet Commonly known as: VESICARE    Zinc Oxide 16 %  Oint           TAKE these medications     acetaminophen  500 MG tablet Commonly known as: TYLENOL  Take 500 mg by mouth every 6 (six) hours as needed for moderate pain.    artificial tears ophthalmic solution Place 1 drop into the right eye as needed for dry eyes (put at bedside for pt). What  changed:  when to take this reasons to take this    aspirin  EC 81 MG tablet Take 1 tablet (81 mg total) by mouth daily. Swallow whole.    baclofen  10 MG tablet Commonly known as: LIORESAL  Take 25 mg by mouth at bedtime.    bisacodyl  10 MG suppository Commonly known as: DULCOLAX Place 1 suppository (10 mg total) rectally daily after supper.    carbamide peroxide 6.5 % OTIC solution Commonly known as: DEBROX Place 5 drops into the right ear 2 (two) times daily.    docusate sodium  100 MG capsule Commonly known as: Colace Take 1 capsule (100 mg total) by mouth daily as needed for up to 30 doses.    fluticasone  50 MCG/ACT nasal spray Commonly known as: FLONASE  Place 1 spray into both nostrils daily as needed for allergies or rhinitis.    levothyroxine  50 MCG tablet Commonly known as: SYNTHROID  Take 50 mcg by mouth daily before breakfast.    melatonin 3 MG Tabs tablet Take 3-6 mg by mouth at bedtime as needed (sleep).    meropenem  1 g in sodium chloride  0.9 % 100 mL Inject 1 g into the vein every 8 (eight) hours for 2 days.    polyethylene glycol 17 g packet Commonly known as: MIRALAX  / GLYCOLAX  Take 34 g by mouth daily. Mix in 8 ounces a fluid per day    senna-docusate 8.6-50 MG tablet Commonly known as: Senokot-S Take 3 tablets by mouth daily at 6 (six) AM. What changed: how much to take     Relevant Imaging Results:  Relevant Lab Results:   Additional Information [id number]  Kathryn Parish, RN

## 2023-09-22 NOTE — Progress Notes (Signed)
 Attempted to call for report multiple times, no answer at this time. PTAR at bedside.

## 2023-09-23 ENCOUNTER — Encounter: Payer: Self-pay | Admitting: Internal Medicine

## 2023-09-30 ENCOUNTER — Telehealth (HOSPITAL_COMMUNITY): Payer: Self-pay

## 2023-09-30 NOTE — Telephone Encounter (Signed)
 Received call from the [place] [facility] facility requesting bags for patient's suprapubic catheter placed on 5/28. I transferred them to the IR RN station for further information on getting supplies for him. JM

## 2023-10-01 ENCOUNTER — Ambulatory Visit: Admitting: Physician Assistant

## 2023-10-29 ENCOUNTER — Ambulatory Visit (HOSPITAL_COMMUNITY)
Admission: RE | Admit: 2023-10-29 | Discharge: 2023-10-29 | Disposition: A | Discharge status: Home / Self Care | Source: Ambulatory Visit | Attending: Radiology | Admitting: Radiology

## 2023-10-29 ENCOUNTER — Other Ambulatory Visit: Payer: Self-pay

## 2023-10-29 ENCOUNTER — Ambulatory Visit (HOSPITAL_COMMUNITY)
Admit: 2023-10-29 | Discharge: 2023-10-29 | Disposition: A | Discharge status: Home / Self Care | Attending: Radiology | Admitting: Radiology

## 2023-10-29 ENCOUNTER — Other Ambulatory Visit: Payer: Self-pay | Admitting: Radiology

## 2023-10-29 ENCOUNTER — Encounter (HOSPITAL_COMMUNITY): Payer: Self-pay

## 2023-10-29 DIAGNOSIS — Z435 Encounter for attention to cystostomy: Secondary | ICD-10-CM | POA: Diagnosis present

## 2023-10-29 DIAGNOSIS — G4730 Sleep apnea, unspecified: Secondary | ICD-10-CM | POA: Diagnosis not present

## 2023-10-29 DIAGNOSIS — N400 Enlarged prostate without lower urinary tract symptoms: Secondary | ICD-10-CM | POA: Diagnosis not present

## 2023-10-29 DIAGNOSIS — N319 Neuromuscular dysfunction of bladder, unspecified: Secondary | ICD-10-CM | POA: Diagnosis not present

## 2023-10-29 DIAGNOSIS — G2581 Restless legs syndrome: Secondary | ICD-10-CM | POA: Insufficient documentation

## 2023-10-29 DIAGNOSIS — Z87440 Personal history of urinary (tract) infections: Secondary | ICD-10-CM | POA: Insufficient documentation

## 2023-10-29 DIAGNOSIS — I10 Essential (primary) hypertension: Secondary | ICD-10-CM | POA: Insufficient documentation

## 2023-10-29 DIAGNOSIS — E119 Type 2 diabetes mellitus without complications: Secondary | ICD-10-CM | POA: Insufficient documentation

## 2023-10-29 DIAGNOSIS — Z86718 Personal history of other venous thrombosis and embolism: Secondary | ICD-10-CM | POA: Diagnosis not present

## 2023-10-29 DIAGNOSIS — G8220 Paraplegia, unspecified: Secondary | ICD-10-CM | POA: Insufficient documentation

## 2023-10-29 DIAGNOSIS — E039 Hypothyroidism, unspecified: Secondary | ICD-10-CM | POA: Diagnosis not present

## 2023-10-29 HISTORY — PX: IR CYSTOSTOMY TUBE CHANGE COMPLICATED W IMG: IMG1098

## 2023-10-29 LAB — CBC WITH DIFFERENTIAL/PLATELET
Abs Immature Granulocytes: 0.04 K/uL (ref 0.00–0.07)
Basophils Absolute: 0 K/uL (ref 0.0–0.1)
Basophils Relative: 1 %
Eosinophils Absolute: 0.3 K/uL (ref 0.0–0.5)
Eosinophils Relative: 3 %
HCT: 46.1 % (ref 39.0–52.0)
Hemoglobin: 14.8 g/dL (ref 13.0–17.0)
Immature Granulocytes: 1 %
Lymphocytes Relative: 18 %
Lymphs Abs: 1.4 K/uL (ref 0.7–4.0)
MCH: 31.6 pg (ref 26.0–34.0)
MCHC: 32.1 g/dL (ref 30.0–36.0)
MCV: 98.3 fL (ref 80.0–100.0)
Monocytes Absolute: 0.4 K/uL (ref 0.1–1.0)
Monocytes Relative: 5 %
Neutro Abs: 5.6 K/uL (ref 1.7–7.7)
Neutrophils Relative %: 72 %
Platelets: 265 K/uL (ref 150–400)
RBC: 4.69 MIL/uL (ref 4.22–5.81)
RDW: 14.2 % (ref 11.5–15.5)
WBC: 7.7 K/uL (ref 4.0–10.5)
nRBC: 0 % (ref 0.0–0.2)

## 2023-10-29 LAB — BASIC METABOLIC PANEL WITH GFR
Anion gap: 12 (ref 5–15)
BUN: 17 mg/dL (ref 8–23)
CO2: 23 mmol/L (ref 22–32)
Calcium: 9 mg/dL (ref 8.9–10.3)
Chloride: 104 mmol/L (ref 98–111)
Creatinine, Ser: 0.59 mg/dL — ABNORMAL LOW (ref 0.61–1.24)
GFR, Estimated: >60 mL/min (ref >60)
Glucose, Bld: 118 mg/dL — ABNORMAL HIGH (ref 70–99)
Potassium: 4.6 mmol/L (ref 3.5–5.1)
Sodium: 139 mmol/L (ref 135–145)

## 2023-10-29 MED ORDER — MIDAZOLAM HCL 2 MG/2ML IJ SOLN
INTRAMUSCULAR | Status: AC
  Filled 2023-10-29: qty 2

## 2023-10-29 MED ORDER — MIDAZOLAM HCL 2 MG/2ML IJ SOLN
INTRAMUSCULAR | Status: AC | PRN
  Administered 2023-10-29: 1 mg via INTRAVENOUS

## 2023-10-29 MED ORDER — LIDOCAINE HCL 1 % IJ SOLN
INTRAMUSCULAR | Status: AC
  Filled 2023-10-29: qty 20

## 2023-10-29 MED ORDER — LIDOCAINE HCL URETHRAL/MUCOSAL 2 % EX GEL: 1.0000 | Freq: Once | CUTANEOUS | Status: DC

## 2023-10-29 MED ORDER — FENTANYL CITRATE (PF) 100 MCG/2ML IJ SOLN
INTRAMUSCULAR | Status: AC
  Filled 2023-10-29: qty 2

## 2023-10-29 MED ORDER — IOHEXOL 300 MG/ML  SOLN
50.0000 mL | Freq: Once | INTRAMUSCULAR | Status: AC | PRN
  Administered 2023-10-29: 20 mL

## 2023-10-29 MED ORDER — SODIUM CHLORIDE 0.9 % IV SOLN: INTRAVENOUS | Status: DC

## 2023-10-29 MED ORDER — LIDOCAINE VISCOUS HCL 2 % MT SOLN
OROMUCOSAL | Status: AC
Start: 2023-10-29 — End: 2023-10-29
  Filled 2023-10-29: qty 15

## 2023-10-29 MED ORDER — FENTANYL CITRATE (PF) 100 MCG/2ML IJ SOLN
INTRAMUSCULAR | Status: AC | PRN
  Administered 2023-10-29: 50 ug via INTRAVENOUS

## 2023-10-29 NOTE — Sedation Documentation (Signed)
 RN Jerred Zaremba pulled 2 mg Versed  and 100 mcg Fentanyl  in Ir room. Pt. Received 2 mg Versed  and 50 mcg Fentanyl  throughout the procedure. RN Timoth Schara wasted  50 mcg Fentanyl  with Forensic psychologist.
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
## 2023-10-29 NOTE — Procedures (Signed)
 Vascular and Interventional Radiology Procedure Note  Patient: Edward Waller DOB: 03-28-1933 Medical Record Number: [id number] Note Date/Time: 10/29/23 1:57 PM   Performing Physician: Thom Hall, MD Assistant(s): None  Diagnosis: Routine exchange. and upsize to Council catheter  Procedure: SUPRAPUBIC CYSTOSTOMY TUBE UPSIZE and EXCHANGE  Anesthesia: Conscious Sedation Complications: None Estimated Blood Loss: Minimal  Findings:  Successful exchange and upsize to a 67F Council suprapubic cystostomy tube under fluoroscopy.  Plan: Pt to follow up with Urology for routine SP catheter exchanges.   See detailed procedure note with images in PACS. The patient tolerated the procedure well without incident or complication and was returned to Recovery in stable condition.    Thom Hall, MD [facility] Radiology   Pager. [phone] Clinic. [phone]

## 2023-10-29 NOTE — Discharge Instructions (Addendum)
Discharge Instructions:   Please call Interventional Radiology clinic [phone] with any questions or concerns.  You may remove your dressing and shower tomorrow.      Moderate Conscious Sedation, Adult, Care After This sheet gives you information about how to care for yourself after your procedure. Your health care provider may also give you more specific instructions. If you have problems or questions, contact your health care provider. What can I expect after the procedure? After the procedure, it is common to have: Sleepiness for several hours. Impaired judgment for several hours. Difficulty with balance. Vomiting if you eat too soon. Follow these instructions at home: For the time period you were told by your health care provider:  Rest. Do not participate in activities where you could fall or become injured. Do not drive or use machinery. Do not drink alcohol. Do not take sleeping pills or medicines that cause drowsiness. Do not make important decisions or sign legal documents. Do not take care of children on your own. Eating and drinking  Follow the diet recommended by your health care provider. Drink enough fluid to keep your urine pale yellow. If you vomit: Drink water, juice, or soup when you can drink without vomiting. Make sure you have little or no nausea before eating solid foods. General instructions Take over-the-counter and prescription medicines only as told by your health care provider. Have a responsible adult stay with you for the time you are told. It is important to have someone help care for you until you are awake and alert. Do not smoke. Keep all follow-up visits as told by your health care provider. This is important. Contact a health care provider if: You are still sleepy or having trouble with balance after 24 hours. You feel light-headed. You keep feeling nauseous or you keep vomiting. You develop a rash. You have a fever. You have redness  or swelling around the IV site. Get help right away if: You have trouble breathing. You have new-onset confusion at home. Summary After the procedure, it is common to feel sleepy, have impaired judgment, or feel nauseous if you eat too soon. Rest after you get home. Know the things you should not do after the procedure. Follow the diet recommended by your health care provider and drink enough fluid to keep your urine pale yellow. Get help right away if you have trouble breathing or new-onset confusion at home. This information is not intended to replace advice given to you by your health care provider. Make sure you discuss any questions you have with your health care provider. Document Revised: 08/06/2019 Document Reviewed: 03/04/2019 Elsevier Patient Education  2023 [employer]  

## 2024-04-14 ENCOUNTER — Telehealth: Admitting: Family Medicine

## 2024-04-14 DIAGNOSIS — L309 Dermatitis, unspecified: Secondary | ICD-10-CM

## 2024-04-14 MED ORDER — NYSTATIN 100000 UNIT/GM EX CREA: 1.0000 | TOPICAL_CREAM | Freq: Two times a day (BID) | CUTANEOUS | 0 refills | Status: DC

## 2024-04-14 MED ORDER — TRIAMCINOLONE ACETONIDE 0.1 % EX CREA: 1.0000 | TOPICAL_CREAM | Freq: Two times a day (BID) | CUTANEOUS | 0 refills | Status: AC

## 2024-04-14 NOTE — Progress Notes (Signed)
 E Visit for Rash  We are sorry that you are not feeling well. Here is how we plan to help!   Based upon your presentation it appears you have a fungal infection.  I have prescribed: and Nystatin  cream apply to the affected area twice daily   HOME CARE:  Take cool showers and avoid direct sunlight. Apply cool compress or wet dressings. Take a bath in an oatmeal bath.  Sprinkle content of one Aveeno packet under running faucet with comfortably warm water .  Bathe for 15-20 minutes, 1-2 times daily.  Pat dry with a towel. Do not rub the rash. Use hydrocortisone cream. Take an antihistamine like Benadryl  for widespread rashes that itch.  The adult dose of Benadryl  is 25-50 mg by mouth 4 times daily. Caution:  This type of medication may cause sleepiness.  Do not drink alcohol , drive, or operate dangerous machinery while taking antihistamines.  Do not take these medications if you have prostate enlargement.  Read package instructions thoroughly on all medications that you take.  GET HELP RIGHT AWAY IF:  Symptoms don't go away after treatment. Severe itching that persists. If you rash spreads or swells. If you rash begins to smell. If it blisters and opens or develops a yellow-brown crust. You develop a fever. You have a sore throat. You become short of breath.  MAKE SURE YOU:  Understand these instructions. Will watch your condition. Will get help right away if you are not doing well or get worse.  Thank you for choosing an e-visit. Your e-visit answers were reviewed by a board certified advanced clinical practitioner to complete your personal care plan. Depending upon the condition, your plan could have included both over the counter or prescription medications. Please review your pharmacy choice. Be sure that the pharmacy you have chosen is open so that you can pick up your prescription now.  If there is a problem you may message your provider in MyChart to have the prescription  routed to another pharmacy. Your safety is important to us . If you have drug allergies check your prescription carefully.  For the next 24 hours, you can use MyChart to ask questions about todays visit, request a non-urgent call back, or ask for a work or school excuse from your e-visit provider. You will get an email in the next two days asking about your experience. I hope that your e-visit has been valuable and will speed your recovery.  I have spent 5 minutes in review of e-visit questionnaire, review and updating patient chart, medical decision making and response to patient.   Roosvelt Mater, PA-C

## 2024-04-14 NOTE — Addendum Note (Signed)
 Addended by: VIVIENNE DELON HERO on: 04/14/2024 12:26 PM   Modules accepted: Orders

## 2024-04-18 ENCOUNTER — Other Ambulatory Visit: Payer: Self-pay

## 2024-04-18 ENCOUNTER — Emergency Department (HOSPITAL_COMMUNITY)

## 2024-04-18 ENCOUNTER — Inpatient Hospital Stay (HOSPITAL_COMMUNITY)
Admission: EM | Admit: 2024-04-18 | Discharge: 2024-04-24 | DRG: 637 | Disposition: A | Discharge status: Hospice | Attending: Internal Medicine | Admitting: Internal Medicine

## 2024-04-18 ENCOUNTER — Encounter (HOSPITAL_COMMUNITY): Payer: Self-pay | Admitting: Internal Medicine

## 2024-04-18 DIAGNOSIS — Z7984 Long term (current) use of oral hypoglycemic drugs: Secondary | ICD-10-CM

## 2024-04-18 DIAGNOSIS — L89512 Pressure ulcer of right ankle, stage 2: Secondary | ICD-10-CM | POA: Diagnosis present

## 2024-04-18 DIAGNOSIS — M86151 Other acute osteomyelitis, right femur: Principal | ICD-10-CM

## 2024-04-18 DIAGNOSIS — N400 Enlarged prostate without lower urinary tract symptoms: Secondary | ICD-10-CM | POA: Diagnosis present

## 2024-04-18 DIAGNOSIS — G2581 Restless legs syndrome: Secondary | ICD-10-CM | POA: Diagnosis present

## 2024-04-18 DIAGNOSIS — M51369 Other intervertebral disc degeneration, lumbar region without mention of lumbar back pain or lower extremity pain: Secondary | ICD-10-CM | POA: Diagnosis present

## 2024-04-18 DIAGNOSIS — N319 Neuromuscular dysfunction of bladder, unspecified: Secondary | ICD-10-CM | POA: Diagnosis present

## 2024-04-18 DIAGNOSIS — I5189 Other ill-defined heart diseases: Secondary | ICD-10-CM

## 2024-04-18 DIAGNOSIS — M25519 Pain in unspecified shoulder: Secondary | ICD-10-CM | POA: Insufficient documentation

## 2024-04-18 DIAGNOSIS — M869 Osteomyelitis, unspecified: Secondary | ICD-10-CM | POA: Diagnosis not present

## 2024-04-18 DIAGNOSIS — K592 Neurogenic bowel, not elsewhere classified: Secondary | ICD-10-CM | POA: Diagnosis present

## 2024-04-18 DIAGNOSIS — Z85828 Personal history of other malignant neoplasm of skin: Secondary | ICD-10-CM

## 2024-04-18 DIAGNOSIS — K5900 Constipation, unspecified: Secondary | ICD-10-CM | POA: Diagnosis present

## 2024-04-18 DIAGNOSIS — H9313 Tinnitus, bilateral: Secondary | ICD-10-CM | POA: Insufficient documentation

## 2024-04-18 DIAGNOSIS — G8929 Other chronic pain: Secondary | ICD-10-CM | POA: Diagnosis present

## 2024-04-18 DIAGNOSIS — E119 Type 2 diabetes mellitus without complications: Secondary | ICD-10-CM

## 2024-04-18 DIAGNOSIS — Z7982 Long term (current) use of aspirin: Secondary | ICD-10-CM

## 2024-04-18 DIAGNOSIS — L89309 Pressure ulcer of unspecified buttock, unspecified stage: Secondary | ICD-10-CM | POA: Diagnosis present

## 2024-04-18 DIAGNOSIS — L89314 Pressure ulcer of right buttock, stage 4: Secondary | ICD-10-CM | POA: Diagnosis present

## 2024-04-18 DIAGNOSIS — T148XXA Other injury of unspecified body region, initial encounter: Secondary | ICD-10-CM

## 2024-04-18 DIAGNOSIS — E1169 Type 2 diabetes mellitus with other specified complication: Principal | ICD-10-CM | POA: Diagnosis present

## 2024-04-18 DIAGNOSIS — Z96641 Presence of right artificial hip joint: Secondary | ICD-10-CM | POA: Diagnosis present

## 2024-04-18 DIAGNOSIS — G8220 Paraplegia, unspecified: Secondary | ICD-10-CM | POA: Diagnosis present

## 2024-04-18 DIAGNOSIS — M25551 Pain in right hip: Secondary | ICD-10-CM | POA: Diagnosis present

## 2024-04-18 DIAGNOSIS — E039 Hypothyroidism, unspecified: Secondary | ICD-10-CM | POA: Diagnosis present

## 2024-04-18 DIAGNOSIS — Z515 Encounter for palliative care: Secondary | ICD-10-CM

## 2024-04-18 DIAGNOSIS — L89150 Pressure ulcer of sacral region, unstageable: Secondary | ICD-10-CM

## 2024-04-18 DIAGNOSIS — Z66 Do not resuscitate: Secondary | ICD-10-CM | POA: Diagnosis present

## 2024-04-18 DIAGNOSIS — I10 Essential (primary) hypertension: Secondary | ICD-10-CM | POA: Diagnosis present

## 2024-04-18 DIAGNOSIS — Z79890 Hormone replacement therapy (postmenopausal): Secondary | ICD-10-CM

## 2024-04-18 DIAGNOSIS — Z86718 Personal history of other venous thrombosis and embolism: Secondary | ICD-10-CM

## 2024-04-18 LAB — CBC WITH DIFFERENTIAL/PLATELET
Abs Immature Granulocytes: 0.02 K/uL (ref 0.00–0.07)
Basophils Absolute: 0 K/uL (ref 0.0–0.1)
Basophils Relative: 1 %
Eosinophils Absolute: 0.6 K/uL — ABNORMAL HIGH (ref 0.0–0.5)
Eosinophils Relative: 7 %
HCT: 46.6 % (ref 39.0–52.0)
Hemoglobin: 15 g/dL (ref 13.0–17.0)
Immature Granulocytes: 0 %
Lymphocytes Relative: 16 %
Lymphs Abs: 1.3 K/uL (ref 0.7–4.0)
MCH: 31 pg (ref 26.0–34.0)
MCHC: 32.2 g/dL (ref 30.0–36.0)
MCV: 96.3 fL (ref 80.0–100.0)
Monocytes Absolute: 0.4 K/uL (ref 0.1–1.0)
Monocytes Relative: 5 %
Neutro Abs: 5.7 K/uL (ref 1.7–7.7)
Neutrophils Relative %: 71 %
Platelets: 272 K/uL (ref 150–400)
RBC: 4.84 MIL/uL (ref 4.22–5.81)
RDW: 14.9 % (ref 11.5–15.5)
WBC: 8 K/uL (ref 4.0–10.5)
nRBC: 0 % (ref 0.0–0.2)

## 2024-04-18 LAB — COMPREHENSIVE METABOLIC PANEL WITH GFR
ALT: 22 U/L (ref 0–44)
AST: 30 U/L (ref 15–41)
Albumin: 3.9 g/dL (ref 3.5–5.0)
Alkaline Phosphatase: 113 U/L (ref 38–126)
Anion gap: 9 (ref 5–15)
BUN: 15 mg/dL (ref 8–23)
CO2: 28 mmol/L (ref 22–32)
Calcium: 9.8 mg/dL (ref 8.9–10.3)
Chloride: 102 mmol/L (ref 98–111)
Creatinine, Ser: 0.59 mg/dL — ABNORMAL LOW (ref 0.61–1.24)
GFR, Estimated: >60 mL/min
Glucose, Bld: 132 mg/dL — ABNORMAL HIGH (ref 70–99)
Potassium: 4.5 mmol/L (ref 3.5–5.1)
Sodium: 138 mmol/L (ref 135–145)
Total Bilirubin: 0.4 mg/dL (ref 0.0–1.2)
Total Protein: 8.7 g/dL — ABNORMAL HIGH (ref 6.5–8.1)

## 2024-04-18 LAB — URINALYSIS, W/ REFLEX TO CULTURE (INFECTION SUSPECTED)
Bilirubin Urine: NEGATIVE
Glucose, UA: NEGATIVE mg/dL
Hgb urine dipstick: NEGATIVE
Ketones, ur: NEGATIVE mg/dL
Nitrite: NEGATIVE
Protein, ur: NEGATIVE mg/dL
Specific Gravity, Urine: 1.005 (ref 1.005–1.030)
WBC, UA: >50 WBC/hpf (ref 0–5)
pH: 7 (ref 5.0–8.0)

## 2024-04-18 LAB — I-STAT CG4 LACTIC ACID, ED: Lactic Acid, Venous: 1.2 mmol/L (ref 0.5–1.9)

## 2024-04-18 LAB — GLUCOSE, CAPILLARY: Glucose-Capillary: 187 mg/dL — ABNORMAL HIGH (ref 70–99)

## 2024-04-18 LAB — PROTIME-INR
INR: 1.1 (ref 0.8–1.2)
Prothrombin Time: 14.8 s (ref 11.4–15.2)

## 2024-04-18 MED ORDER — VANCOMYCIN HCL IN DEXTROSE 1-5 GM/200ML-% IV SOLN: 1000.0000 mg | Freq: Once | INTRAVENOUS | Status: DC

## 2024-04-18 MED ORDER — INSULIN ASPART 100 UNIT/ML IJ SOLN
0.0000 [IU] | Freq: Three times a day (TID) | INTRAMUSCULAR | Status: DC
  Administered 2024-04-19 – 2024-04-20 (×5): 1 [IU] via SUBCUTANEOUS
  Administered 2024-04-21: 2 [IU] via SUBCUTANEOUS

## 2024-04-18 MED ORDER — ENOXAPARIN SODIUM 40 MG/0.4ML IJ SOSY
40.0000 mg | PREFILLED_SYRINGE | INTRAMUSCULAR | Status: DC
  Administered 2024-04-18 – 2024-04-20 (×3): 40 mg via SUBCUTANEOUS
  Filled 2024-04-18: qty 0.4

## 2024-04-18 MED ORDER — BISACODYL 10 MG RE SUPP: 10.0000 mg | Freq: Every day | RECTAL | Status: DC | PRN

## 2024-04-18 MED ORDER — LEVOTHYROXINE SODIUM 50 MCG PO TABS
50.0000 ug | ORAL_TABLET | Freq: Every day | ORAL | Status: DC
  Administered 2024-04-19 – 2024-04-24 (×6): 50 ug via ORAL

## 2024-04-18 MED ORDER — BACLOFEN 10 MG PO TABS
10.0000 mg | ORAL_TABLET | Freq: Two times a day (BID) | ORAL | Status: DC | PRN
  Administered 2024-04-20 – 2024-04-23 (×2): 10 mg via ORAL

## 2024-04-18 MED ORDER — IOHEXOL 300 MG/ML  SOLN
100.0000 mL | Freq: Once | INTRAMUSCULAR | Status: AC | PRN
  Administered 2024-04-18: 100 mL via INTRAVENOUS

## 2024-04-18 MED ORDER — INSULIN ASPART 100 UNIT/ML IJ SOLN: 0.0000 [IU] | Freq: Every day | INTRAMUSCULAR | Status: DC

## 2024-04-18 MED ORDER — BACLOFEN 10 MG PO TABS
25.0000 mg | ORAL_TABLET | Freq: Every day | ORAL | Status: DC
  Administered 2024-04-18 – 2024-04-23 (×6): 25 mg via ORAL
  Filled 2024-04-18: qty 3

## 2024-04-18 MED ORDER — SODIUM CHLORIDE 0.9 % IV SOLN
1.0000 g | Freq: Once | INTRAVENOUS | Status: AC
  Administered 2024-04-18: 1 g via INTRAVENOUS
  Filled 2024-04-18: qty 10

## 2024-04-18 MED ORDER — SODIUM CHLORIDE 0.9 % IV SOLN
2.0000 g | Freq: Three times a day (TID) | INTRAVENOUS | Status: DC
  Administered 2024-04-18 – 2024-04-19 (×3): 2 g via INTRAVENOUS
  Filled 2024-04-18: qty 12.5

## 2024-04-18 MED ORDER — ACETAMINOPHEN 325 MG PO TABS
650.0000 mg | ORAL_TABLET | Freq: Four times a day (QID) | ORAL | Status: DC | PRN
  Administered 2024-04-20 – 2024-04-24 (×5): 650 mg via ORAL

## 2024-04-18 MED ORDER — ONDANSETRON HCL 4 MG/2ML IJ SOLN: 4.0000 mg | Freq: Four times a day (QID) | INTRAMUSCULAR | Status: DC | PRN

## 2024-04-18 MED ORDER — ONDANSETRON HCL 4 MG PO TABS: 4.0000 mg | ORAL_TABLET | Freq: Four times a day (QID) | ORAL | Status: DC | PRN

## 2024-04-18 MED ORDER — VANCOMYCIN HCL IN DEXTROSE 1-5 GM/200ML-% IV SOLN
1000.0000 mg | Freq: Once | INTRAVENOUS | Status: AC
  Administered 2024-04-18: 1000 mg via INTRAVENOUS
  Filled 2024-04-18: qty 200

## 2024-04-18 MED ORDER — ACETAMINOPHEN 650 MG RE SUPP: 650.0000 mg | Freq: Four times a day (QID) | RECTAL | Status: DC | PRN

## 2024-04-18 MED ORDER — VANCOMYCIN HCL 1500 MG/300ML IV SOLN: 1500.0000 mg | INTRAVENOUS | Status: DC

## 2024-04-18 NOTE — Progress Notes (Signed)
 Pharmacy Antibiotic Note  Edward Waller is a 89 y.o. male admitted on 04/18/2024 with sacral ulcer. Patient with history of paraplegia, was in short term living facility and developed sacral ulcer while living there. Family reports this progressed to osteomyelitis and patient was on 8 weeks of antibiotics which he has been off of now for over a month. Noted foul odor and purulent drainage during wound care at home. Imaging here concerning for right ischial tuberosity osteomyelitis. Pharmacy has been consulted for vancomycin  and cefepime  dosing.  Plan: -Vancomycin  1000 mg in progress, follow with an additional 1000 mg for total loading dose of 2000 mg then continue with 1500 mg IV q24h -Cefepime  2 g IV q8h -Continue to follow renal function, cultures and clinical progress for dose adjustments and de-escalation as indicated  Weight: 83 kg (182 lb 15.7 oz)  Temp (24hrs), Avg:97.7 F (36.5 C), Min:97.6 F (36.4 C), Max:97.8 F (36.6 C)  Recent Labs  Lab 04/18/24 1305 04/18/24 1444  WBC 8.0  --   CREATININE 0.59*  --   LATICACIDVEN  --  1.2    Estimated Creatinine Clearance: 68.7 mL/min (A) (by C-G formula based on SCr of 0.59 mg/dL (L)).    Allergies[1]  Antimicrobials this admission: Cefepime  12/28 >> Vancomycin  12/28 >> Ceftriaxone  12/28 x 1  Dose adjustments this admission: NA  Microbiology results: 12/28 BCx: pending 12/28 UCx: pending    Thank you for allowing pharmacy to be a part of this patients care.  Stefano MARLA Bologna, PharmD, BCPS Clinical Pharmacist 04/18/2024 5:43 PM      [1]  Allergies Allergen Reactions   Trazodone  And Nefazodone Other (See Comments)    nightmares

## 2024-04-18 NOTE — ED Provider Notes (Signed)
 " [place] EM[facility]GENCY DEPARTMENT AT [facility] Provider Note   CSN: [id number] Arrival date & time: 04/18/24  1146     Patient presents with: No chief complaint on file.   Edward Waller is a 89 y.o. male.  Patient with past medical history of paraplegia, neurogenic bladder with chronic indwelling Foley catheter, BPH, history of kidney stone, diabetes presenting to ER with complain of sacral ulcer.Patient reports that he had a hospitalization for pneumonia and was placed in a short term living facility earlier this year. During his time at facility, he developed his sacral ulcer. This was early September. It progressed into an osteomyelitis. He was on approximately eight weeks of antibiotics. He's been off of antibiotics for over one month now.   I did discuss patient's symptoms with his granddaughter [name] on the phone. She states that she has been taking care of him for the last 10 days. He has been home from rehab facility for 10 days. Everything has been fine with his wound care until yesterday. It started having a foul odor and significant purulent drainage.She has also noted some increased surrounding cellulitis. She reports that this is looking significantly worse today than prior. Home health is supposed to come three times weekly, but stopped coming due to holidays.   HPI     Prior to Admission medications  Medication Sig Start Date End Date Taking? Authorizing Provider  acetaminophen  (TYLENOL ) 500 MG tablet Take 500 mg by mouth every 6 (six) hours as needed for moderate pain.    [provider]  aspirin  EC 81 MG tablet Take 1 tablet (81 mg total) by mouth daily. Swallow whole. 07/26/23   Christobal Guadalajara, MD  baclofen  (LIORESAL ) 10 MG tablet Take 25 mg by mouth at bedtime.    [provider]  bisacodyl  (DULCOLAX) 10 MG suppository Place 1 suppository (10 mg total) rectally daily after supper. 02/13/23   Love, Sharlet RAMAN, PA-C  carbamide peroxide (DEBROX) 6.5 %  OTIC solution Place 5 drops into the right ear 2 (two) times daily. 09/22/23   Raenelle Coria, MD  docusate sodium  (COLACE) 100 MG capsule Take 1 capsule (100 mg total) by mouth daily as needed for up to 30 doses. 08/22/23   Selma Donnice SAUNDERS, MD  fluticasone  (FLONASE ) 50 MCG/ACT nasal spray Place 1 spray into both nostrils daily as needed for allergies or rhinitis.    [provider]  levothyroxine  (SYNTHROID ) 50 MCG tablet Take 50 mcg by mouth daily before breakfast.    [provider]  melatonin 3 MG TABS tablet Take 3-6 mg by mouth at bedtime as needed (sleep).    [provider]  nystatin  cream (MYCOSTATIN ) Apply 1 Application topically 2 (two) times daily. 04/14/24   Reyes, Shaylo, PA-C  polyethylene glycol (MIRALAX  / GLYCOLAX ) 17 g packet Take 34 g by mouth daily. Mix in 8 ounces a fluid per day 02/13/23   Maurice Sharlet RAMAN, PA-C  polyvinyl alcohol  (LIQUIFILM TEARS) 1.4 % ophthalmic solution Place 1 drop into the right eye as needed for dry eyes (put at bedside for pt). Patient taking differently: Place 1 drop into the right eye daily as needed for dry eyes. 07/24/21   Love, Sharlet RAMAN, PA-C  senna-docusate (SENOKOT-S) 8.6-50 MG tablet Take 3 tablets by mouth daily at 6 (six) AM. Patient taking differently: Take 2 tablets by mouth daily at 6 (six) AM. 02/13/23   Love, Sharlet RAMAN, PA-C  triamcinolone  cream (KENALOG ) 0.1 % Apply 1 Application  topically 2 (two) times daily. 04/14/24   Vivienne Delon HERO, PA-C    Allergies: Trazodone  and nefazodone    Review of Systems  Skin:  Positive for wound.    Updated Vital Signs BP (!) 160/75 (BP Location: Left Arm)   Pulse 68   Temp 97.8 F (36.6 C) (Oral)   Resp 16   SpO2 100%   Physical Exam Vitals and nursing note reviewed.  Constitutional:      General: He is not in acute distress.    Appearance: He is not toxic-appearing.  HENT:     Head: Normocephalic and atraumatic.  Eyes:     General: No scleral icterus.     Conjunctiva/sclera: Conjunctivae normal.  Cardiovascular:     Rate and Rhythm: Normal rate and regular rhythm.     Pulses: Normal pulses.     Heart sounds: Normal heart sounds.  Pulmonary:     Effort: Pulmonary effort is normal. No respiratory distress.     Breath sounds: Normal breath sounds.  Abdominal:     General: Abdomen is flat. Bowel sounds are normal.     Palpations: Abdomen is soft.     Tenderness: There is no abdominal tenderness.  Musculoskeletal:     Right lower leg: No edema.     Left lower leg: No edema.     Comments: Large left sided wound to buttock  Skin:    General: Skin is warm and dry.     Findings: No lesion.  Neurological:     General: No focal deficit present.     Mental Status: He is alert and oriented to person, place, and time. Mental status is at baseline.      (all labs ordered are listed, but only abnormal results are displayed) Labs Reviewed - No data to display  EKG: None  Radiology: No results found.   Procedures   Medications Ordered in the ED - No data to display  Clinical Course as of 04/18/24 1613  Sun Apr 18, 2024  1608 WBC: 8.0 [JB]  1610 Bacteria, UA(!): RARE [JB]  1610 Lactic Acid, Venous: 1.2 [JB]    Clinical Course User Index [JB] Torre Pikus, Warren SAILOR, PA-C                                 Medical Decision Making Amount and/or Complexity of Data Reviewed Labs: ordered. Radiology: ordered.  Risk Prescription drug management.   This patient presents to the ED for concern of ulcer, this involves an extensive number of treatment options, and is a complaint that carries with it a high risk of complications and morbidity.  The differential diagnosis includes sepsis, osteomyelitis, cellulitis, abscess    Co morbidities that complicate the patient evaluation  paraplegia, neurogenic bladder with chronic indwelling Foley catheter, BPH, history of kidney stone, diabetes   Additional history obtained:  Additional history  obtained from review visit 09/10/23    Lab Tests:  I personally interpreted labs.  The pertinent results include:   CBC, CMP, Blood cultures, lactic, urine with rare bacteria  Lactic WNL   Imaging Studies ordered:  I ordered imaging studies including CT abd/pelvis pending at sign out.      Cardiac Monitoring: / EKG:  The patient was maintained on a cardiac monitor.  I personally viewed and interpreted the cardiac monitored which showed an underlying rhythm of: sinus   Problem List / ED Course / Critical interventions /  Medication management  Patient reporting to ED with ulcer to right sacral area. This is worse over the past day with increased drainage, purulent drainage noted by family at home. Lab work and vital signs look reassuring without obvious sepsis. Given the severity of the wound I will obtain imaging to rule acute infection.  I have reviewed the patients home medicines and have made adjustments as needed Signed off to [place] PA-C at shift change pending CT Abd/pelvis.         Final diagnoses:  None    ED Discharge Orders     None          Shermon Warren SAILOR, PA-C 04/18/24 1613  "
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
## 2024-04-18 NOTE — ED Triage Notes (Signed)
 Patient BIB EMS from home for pressure ulcer to sacral area. Patient is paraplegic. Came home from facility 2 weeks ago and had ulcer with hx osteomyelitis hx of sepsis in Sept/Oct 2025. Family noticed purulent and bloody drainage this morning. Family denies any fever or oral intake change. Just wanted assessed and evaluated to prevent infection. Patient denies pain. A&O x 4.    60 158/76 98% RA 147 CBG

## 2024-04-18 NOTE — ED Provider Notes (Signed)
 Patient was signed out to myself at shift change.  Of note patient has been experiencing worsening redness and purulent drainage from his decubitus ulcer.  He does have a history of osteomyelitis and has been off of antibiotics for approximately 2-1/2 weeks.  Wife notes that he has required previous surgery for osteomyelitis.  He has had no associated fever or chills at home. Physical Exam  BP (!) 157/101   Pulse (!) 56   Temp 97.8 F (36.6 C) (Oral)   Resp 16   SpO2 97%   Physical Exam  Procedures  Procedures  ED Course / MDM   Clinical Course as of 04/18/24 1624  Sun Apr 18, 2024  1608 WBC: 8.0 [JB]  1610 Bacteria, UA(!): RARE [JB]  1610 Lactic Acid, Venous: 1.2 [JB]    Clinical Course User Index [JB] Barrett, Warren SAILOR, PA-C   Medical Decision Making Amount and/or Complexity of Data Reviewed Labs: ordered. Decision-making details documented in ED Course. Radiology: ordered.  Risk Prescription drug management. Decision regarding hospitalization.   Patient does remain stable at this time and is overall well-appearing at this time.  CT scan of the abdomen pelvis is concerning for osteomyelitis of the ischial tuberosity.  Blood work is overall unremarkable at this point and he does not meet sepsis criteria.  Have added on vancomycin  and Rocephin .  Have discussed patient case with the hospitalist who has excepted for admission at this time.       Daralene Lonni BIRCH, PA-C 04/18/24 1626    Mannie Pac T, DO 04/18/24 2307

## 2024-04-19 DIAGNOSIS — Z711 Person with feared health complaint in whom no diagnosis is made: Secondary | ICD-10-CM | POA: Diagnosis not present

## 2024-04-19 DIAGNOSIS — Z79890 Hormone replacement therapy (postmenopausal): Secondary | ICD-10-CM | POA: Diagnosis not present

## 2024-04-19 DIAGNOSIS — Z66 Do not resuscitate: Secondary | ICD-10-CM

## 2024-04-19 DIAGNOSIS — Z86718 Personal history of other venous thrombosis and embolism: Secondary | ICD-10-CM | POA: Diagnosis not present

## 2024-04-19 DIAGNOSIS — K592 Neurogenic bowel, not elsewhere classified: Secondary | ICD-10-CM | POA: Diagnosis present

## 2024-04-19 DIAGNOSIS — I10 Essential (primary) hypertension: Secondary | ICD-10-CM | POA: Diagnosis present

## 2024-04-19 DIAGNOSIS — G8220 Paraplegia, unspecified: Secondary | ICD-10-CM | POA: Diagnosis present

## 2024-04-19 DIAGNOSIS — L89314 Pressure ulcer of right buttock, stage 4: Secondary | ICD-10-CM | POA: Diagnosis present

## 2024-04-19 DIAGNOSIS — M51369 Other intervertebral disc degeneration, lumbar region without mention of lumbar back pain or lower extremity pain: Secondary | ICD-10-CM | POA: Diagnosis present

## 2024-04-19 DIAGNOSIS — L89159 Pressure ulcer of sacral region, unspecified stage: Secondary | ICD-10-CM | POA: Diagnosis not present

## 2024-04-19 DIAGNOSIS — G2581 Restless legs syndrome: Secondary | ICD-10-CM | POA: Diagnosis present

## 2024-04-19 DIAGNOSIS — N400 Enlarged prostate without lower urinary tract symptoms: Secondary | ICD-10-CM | POA: Diagnosis present

## 2024-04-19 DIAGNOSIS — L89512 Pressure ulcer of right ankle, stage 2: Secondary | ICD-10-CM | POA: Diagnosis present

## 2024-04-19 DIAGNOSIS — Z85828 Personal history of other malignant neoplasm of skin: Secondary | ICD-10-CM | POA: Diagnosis not present

## 2024-04-19 DIAGNOSIS — Z7189 Other specified counseling: Secondary | ICD-10-CM | POA: Diagnosis not present

## 2024-04-19 DIAGNOSIS — M25551 Pain in right hip: Secondary | ICD-10-CM | POA: Diagnosis present

## 2024-04-19 DIAGNOSIS — E039 Hypothyroidism, unspecified: Secondary | ICD-10-CM | POA: Diagnosis present

## 2024-04-19 DIAGNOSIS — Z515 Encounter for palliative care: Secondary | ICD-10-CM | POA: Diagnosis not present

## 2024-04-19 DIAGNOSIS — M869 Osteomyelitis, unspecified: Secondary | ICD-10-CM | POA: Diagnosis present

## 2024-04-19 DIAGNOSIS — M86151 Other acute osteomyelitis, right femur: Secondary | ICD-10-CM | POA: Diagnosis not present

## 2024-04-19 DIAGNOSIS — Z96641 Presence of right artificial hip joint: Secondary | ICD-10-CM | POA: Diagnosis present

## 2024-04-19 DIAGNOSIS — E1169 Type 2 diabetes mellitus with other specified complication: Secondary | ICD-10-CM | POA: Diagnosis present

## 2024-04-19 DIAGNOSIS — K5900 Constipation, unspecified: Secondary | ICD-10-CM | POA: Diagnosis present

## 2024-04-19 DIAGNOSIS — N319 Neuromuscular dysfunction of bladder, unspecified: Secondary | ICD-10-CM | POA: Diagnosis present

## 2024-04-19 DIAGNOSIS — Z7982 Long term (current) use of aspirin: Secondary | ICD-10-CM | POA: Diagnosis not present

## 2024-04-19 DIAGNOSIS — Z7984 Long term (current) use of oral hypoglycemic drugs: Secondary | ICD-10-CM | POA: Diagnosis not present

## 2024-04-19 DIAGNOSIS — G8929 Other chronic pain: Secondary | ICD-10-CM | POA: Diagnosis present

## 2024-04-19 DIAGNOSIS — T148XXA Other injury of unspecified body region, initial encounter: Secondary | ICD-10-CM

## 2024-04-19 LAB — CBC
HCT: 43.6 % (ref 39.0–52.0)
Hemoglobin: 14.1 g/dL (ref 13.0–17.0)
MCH: 30.5 pg (ref 26.0–34.0)
MCHC: 32.3 g/dL (ref 30.0–36.0)
MCV: 94.2 fL (ref 80.0–100.0)
Platelets: 272 K/uL (ref 150–400)
RBC: 4.63 MIL/uL (ref 4.22–5.81)
RDW: 14.8 % (ref 11.5–15.5)
WBC: 5.8 K/uL (ref 4.0–10.5)
nRBC: 0 % (ref 0.0–0.2)

## 2024-04-19 LAB — COMPREHENSIVE METABOLIC PANEL WITH GFR
ALT: 18 U/L (ref 0–44)
AST: 26 U/L (ref 15–41)
Albumin: 3.6 g/dL (ref 3.5–5.0)
Alkaline Phosphatase: 101 U/L (ref 38–126)
Anion gap: 13 (ref 5–15)
BUN: 12 mg/dL (ref 8–23)
CO2: 20 mmol/L — ABNORMAL LOW (ref 22–32)
Calcium: 9.4 mg/dL (ref 8.9–10.3)
Chloride: 104 mmol/L (ref 98–111)
Creatinine, Ser: 0.57 mg/dL — ABNORMAL LOW (ref 0.61–1.24)
GFR, Estimated: >60 mL/min
Glucose, Bld: 143 mg/dL — ABNORMAL HIGH (ref 70–99)
Potassium: 4.2 mmol/L (ref 3.5–5.1)
Sodium: 137 mmol/L (ref 135–145)
Total Bilirubin: 0.5 mg/dL (ref 0.0–1.2)
Total Protein: 8 g/dL (ref 6.5–8.1)

## 2024-04-19 LAB — HEMOGLOBIN A1C
Hgb A1c MFr Bld: 7.1 % — ABNORMAL HIGH (ref 4.8–5.6)
Mean Plasma Glucose: 157.07 mg/dL

## 2024-04-19 LAB — GLUCOSE, CAPILLARY
Glucose-Capillary: 143 mg/dL — ABNORMAL HIGH (ref 70–99)
Glucose-Capillary: 132 mg/dL — ABNORMAL HIGH (ref 70–99)
Glucose-Capillary: 182 mg/dL — ABNORMAL HIGH (ref 70–99)

## 2024-04-19 LAB — C-REACTIVE PROTEIN: CRP: 1 mg/dL — ABNORMAL HIGH

## 2024-04-19 MED ORDER — ZINC OXIDE 40 % EX OINT
TOPICAL_OINTMENT | Freq: Two times a day (BID) | CUTANEOUS | Status: DC
  Administered 2024-04-19 – 2024-04-21 (×2): 1 via TOPICAL
  Filled 2024-04-19 (×4): qty 57

## 2024-04-19 MED ORDER — JUVEN PO PACK
1.0000 | PACK | Freq: Two times a day (BID) | ORAL | Status: AC
  Administered 2024-04-19 – 2024-04-24 (×9): 1 via ORAL
  Filled 2024-04-19 (×9): qty 1

## 2024-04-19 MED ORDER — VITAMIN C 500 MG PO TABS
500.0000 mg | ORAL_TABLET | Freq: Two times a day (BID) | ORAL | Status: DC
  Administered 2024-04-19 – 2024-04-24 (×10): 500 mg via ORAL
  Filled 2024-04-19 (×10): qty 1

## 2024-04-19 MED ORDER — DAKINS (1/4 STRENGTH) 0.125 % EX SOLN
Freq: Every day | CUTANEOUS | Status: AC
  Filled 2024-04-19: qty 473

## 2024-04-19 MED ORDER — ZINC SULFATE 220 (50 ZN) MG PO CAPS
220.0000 mg | ORAL_CAPSULE | Freq: Every day | ORAL | Status: AC
  Administered 2024-04-19 – 2024-04-24 (×6): 220 mg via ORAL
  Filled 2024-04-19 (×6): qty 1

## 2024-04-19 MED ORDER — ADULT MULTIVITAMIN W/MINERALS CH
1.0000 | ORAL_TABLET | Freq: Every day | ORAL | Status: DC
  Administered 2024-04-19 – 2024-04-24 (×6): 1 via ORAL
  Filled 2024-04-19 (×6): qty 1

## 2024-04-19 MED ORDER — COLLAGENASE 250 UNIT/GM EX OINT
TOPICAL_OINTMENT | Freq: Every day | CUTANEOUS | Status: DC
  Filled 2024-04-19 (×2): qty 30

## 2024-04-19 NOTE — TOC Initial Note (Signed)
 Transition of Care [facility]) - Initial/Assessment Note    Patient Details  Name: Edward Waller MRN: [id number] Date of Birth: 1932/10/04  [facility] CM/SW Contact:    Alfonse JONELLE Rex, RN Phone Number: 04/19/2024, 1:35 PM  Clinical Narrative:    Met with patient and his daughter at bedside to introduce role of INPT CM. Daughter states patient was discharged to [facility] in [place] at most recent hospitalization, he ended up going back to an inpatient hospital and from there admitted to [facility]. Patient had been discharged home with  arranged HH PT and wound care through the [facility], Wound Care Consult was rescheduled to 04/26/24, states she and her other family member's were purchasing would care supplies and performing wound care at home awaiting wound care visit from [facility] Team. Dtr reports patient is a paraplegic, wheelchair bound, chronic suprapubic catheter.   TOC consult for SNF Placement, await PT eval w/recommendation.               Expected Discharge Plan: Skilled Nursing Facility Barriers to Discharge: Continued Medical Work up   Patient Goals and CMS Choice Patient states their goals for this hospitalization and ongoing recovery are:: return home          Expected Discharge Plan and Services       Living arrangements for the past 2 months: Single Family Home                                      Prior Living Arrangements/Services Living arrangements for the past 2 months: Single Family Home Lives with:: Spouse Patient language and need for interpreter reviewed:: Yes        Need for Family Participation in Patient Care: Yes (Comment) Care giver support system in place?: Yes (comment) Current home services: DME (wheelchair bound) Criminal Activity/Legal Involvement Pertinent to Current Situation/Hospitalization: No - Comment as needed  Activities of Daily Living   ADL Screening (condition at time of  admission) Independently performs ADLs?: No Does the patient have a NEW difficulty with bathing/dressing/toileting/self-feeding that is expected to last >3 days?: No Does the patient have a NEW difficulty with getting in/out of bed, walking, or climbing stairs that is expected to last >3 days?: No Does the patient have a NEW difficulty with communication that is expected to last >3 days?: No Is the patient deaf or have difficulty hearing?: No Does the patient have difficulty seeing, even when wearing glasses/contacts?: No Does the patient have difficulty concentrating, remembering, or making decisions?: No  Permission Sought/Granted                  Emotional Assessment Appearance:: Appears stated age Attitude/Demeanor/Rapport: Gracious Affect (typically observed): Unable to Assess Orientation: : Oriented to Self, Oriented to Place, Oriented to  Time, Oriented to Situation Alcohol  / Substance Use: Not Applicable Psych Involvement: No (comment)  Admission diagnosis:  Osteomyelitis of right hip (HCC) [M86.9] Acute osteomyelitis of right pelvic region (HCC) [M86.151] Pressure injury of sacral region, unstageable (HCC) [L89.150] Patient Active Problem List   Diagnosis Date Noted   Osteomyelitis of right hip (HCC) 04/18/2024   Bilateral tinnitus 04/18/2024   Neurogenic bowel 04/18/2024   Shoulder pain 04/18/2024   Grade I diastolic dysfunction 04/18/2024   Pressure injury of buttock 04/18/2024   Excessive cerumen in right ear canal 09/22/2023   Complicated UTI (urinary  tract infection) 09/11/2023   Bacteremia due to Pseudomonas 08/27/2023   Ureteral calculus 07/21/2023   Elevated troponin 07/21/2023   Neurogenic bladder 07/21/2023   Septic shock (HCC) 07/21/2023   History of DVT (deep vein thrombosis) 07/21/2023   Non-insulin  dependent type 2 diabetes mellitus (HCC) 07/20/2023   Malnutrition of moderate degree 01/24/2023   Complete paraplegia (HCC) 01/10/2023   Adjustment  disorder 01/04/2023   Subluxation of T8-T9 thoracic vertebra 01/03/2023   T8 vertebral fracture (HCC) 01/02/2023   OA (osteoarthritis) of hip 03/20/2022   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   Erythema of wound 07/25/2021   Trauma 07/12/2021   Leukocytosis 07/07/2021   Right tibial and fibular fracture 07/06/2021   Sternal fracture 07/06/2021   Orbital fracture (HCC) 07/06/2021   Elevated blood pressure reading 07/06/2021   Encounter to establish care 05/16/2020   Hypothyroidism 05/16/2020   BPH (benign prostatic hyperplasia) 05/16/2020   Closed left ankle fracture 05/16/2020   Restless legs 05/16/2020   Mass of right lower leg 05/16/2020   Chronic low back pain 06/29/2018   PCP:  [facility] Pharmacy:   [id number] [place], [place] - [address] AT [facility] OF [facility] & [address] [place] [address] Phone: [phone] Fax: [phone]  [facility] - [place], [place] - [address] [place] [address] Phone: [phone] Fax: [phone]     Social Drivers of Health (SDOH) Social History: SDOH Screenings   Food Insecurity: No Food Insecurity (04/18/2024)  Housing: Low Risk (04/18/2024)  Transportation Needs: Unmet Transportation Needs (04/18/2024)  Utilities: Not At Risk (04/18/2024)  Social Connections: Socially Integrated (04/18/2024)  Tobacco Use: Low Risk (04/18/2024)   SDOH Interventions:     Readmission Risk Interventions    09/14/2023    4:46 PM 07/22/2023    2:34 PM  Readmission Risk Prevention Plan  Transportation Screening Complete Complete  PCP or Specialist Appt within 3-5 Days Complete Complete  HRI or Home Care Consult Complete Complete  Social Work Consult for Recovery Care Planning/Counseling Complete Complete  Palliative Care Screening Not Applicable Not Applicable  Medication Review Oceanographer) Complete Complete

## 2024-04-19 NOTE — Progress Notes (Signed)
 " PROGRESS NOTE    Edward Waller  FMW:[id number] DOB: 1932/09/23 DOA: 04/18/2024 PCP: [facility]     Brief Narrative:  Edward Waller is a 89 y.o. male with medical history significant of osteoarthritis, bilateral hand pain, history of DVT, grade 1 diastolic dysfunction, squamous cell carcinoma of the scalp, bilateral cataracts, left ankle fracture, chronic right hip pain, nephrolithiasis, hypertension, hypothyroidism, liver cyst, renal cyst, chronic lower back pain, BPH, restless syndrome, sleep apnea, spinal stenosis, paraplegia with neurogenic bladder with chronic indwelling suprapubic catheter after accident in September 2024.   Since then, he has been in various LTAC/SNF facilities.  He tells me that he has developed a pressure ulcer, that has worsened over the past couple months.  He was recently living at home by his caretakers.  He returns to the hospital with purulent discharge of his wound.  CT showed right ischial tuberosity osteomyelitis with overlying decubitus ulcer.  He was started on IV antibiotics and admitted to the hospital.  New events last 24 hours / Subjective: Long conversation with patient today.  He told me about the accident back in September 2024 that led to his paraplegia.  Since then, he has been in various hospital systems/nursing homes for his medical care.  He has unfortunately developed pressure ulcer, that has been worsening.  Assessment & Plan:   Principal Problem:   Osteomyelitis of right hip (HCC) Active Problems:   Neurogenic bladder   Restless legs   History of DVT (deep vein thrombosis)   Hypothyroidism   BPH (benign prostatic hyperplasia)   Non-insulin  dependent type 2 diabetes mellitus (HCC)   Grade I diastolic dysfunction   Pressure injury of buttock   Osteomyelitis of right ischial tuberosity - With underlying stage IV pressure ulcer, present on admission - Wound RN consulted - Continue vancomycin , cefepime  - Sent message  to infectious disease to weigh in on antibiotic plan - Palliative care consulted at family's request - Low air loss mattress ordered  Diabetes mellitus - Hemoglobin A1c 7.1 - Sliding scale insulin   Chronic diastolic dysfunction - Stable  Paraplegia with neurogenic bladder - Suprapubic catheter in place - Baclofen   Hypothyroidism - Synthroid    In agreement with assessment of the pressure ulcer as below:  Wound 04/18/24 1852 Pressure Injury Ischial tuberosity Right Stage 4 - Full thickness tissue loss with exposed bone, tendon or muscle. (Active)     Wound 04/18/24 1853 Pressure Injury Ankle Posterior;Right Stage 2 -  Partial thickness loss of dermis presenting as a shallow open injury with a red, pink wound bed without slough. (Active)         DVT prophylaxis:  enoxaparin  (LOVENOX ) injection 40 mg Start: 04/18/24 2200  Code Status: Full code Family Communication: None at bedside Disposition Plan: Home versus SNF Status is: Inpatient Remains inpatient appropriate   Antimicrobials:  Anti-infectives (From admission, onward)    Start     Dose/Rate Route Frequency Ordered Stop   04/19/24 1800  vancomycin  (VANCOREADY) IVPB 1500 mg/300 mL        1,500 mg 150 mL/hr over 120 Minutes Intravenous Every 24 hours 04/18/24 1747     04/18/24 2000  ceFEPIme  (MAXIPIME ) 2 g in sodium chloride  0.9 % 100 mL IVPB        2 g 200 mL/hr over 30 Minutes Intravenous Every 8 hours 04/18/24 1736     04/18/24 1745  vancomycin  (VANCOCIN ) IVPB 1000 mg/200 mL premix        1,000 mg 200 mL/hr  over 60 Minutes Intravenous  Once 04/18/24 1736     04/18/24 1615  vancomycin  (VANCOCIN ) IVPB 1000 mg/200 mL premix        1,000 mg 200 mL/hr over 60 Minutes Intravenous  Once 04/18/24 1601 04/19/24 0844   04/18/24 1615  cefTRIAXone  (ROCEPHIN ) 1 g in sodium chloride  0.9 % 100 mL IVPB        1 g 200 mL/hr over 30 Minutes Intravenous  Once 04/18/24 1601 04/19/24 0744        Objective: Vitals:    04/19/24 0232 04/19/24 0546 04/19/24 0854 04/19/24 1344  BP: 115/68 130/62  131/69  Pulse: 99 67  66  Resp: 18 18  17   Temp: 98.6 F (37 C) 98 F (36.7 C)  98.4 F (36.9 C)  TempSrc: Oral Oral  Oral  SpO2: 95% 93%  98%  Weight:      Height:   6' (1.829 m)     Intake/Output Summary (Last 24 hours) at 04/19/2024 1416 Last data filed at 04/19/2024 1348 Gross per 24 hour  Intake 1550 ml  Output 5475 ml  Net -3925 ml   Filed Weights   04/18/24 1725  Weight: 83 kg    Examination:  General exam: Appears calm and comfortable  Respiratory system: Respiratory effort normal. No respiratory distress. No conversational dyspnea.  Gastrointestinal system: Abdomen is nondistended, soft  Central nervous system: Alert and oriented. Paraplegia  Psychiatry: Judgement and insight appear normal. Mood & affect appropriate.        Data Reviewed: I have personally reviewed following labs and imaging studies  CBC: Recent Labs  Lab 04/18/24 1305 04/19/24 0836  WBC 8.0 5.8  NEUTROABS 5.7  --   HGB 15.0 14.1  HCT 46.6 43.6  MCV 96.3 94.2  PLT 272 272   Basic Metabolic Panel: Recent Labs  Lab 04/18/24 1305 04/19/24 0836  NA 138 137  K 4.5 4.2  CL 102 104  CO2 28 20*  GLUCOSE 132* 143*  BUN 15 12  CREATININE 0.59* 0.57*  CALCIUM  9.8 9.4   GFR: Estimated Creatinine Clearance: 68.7 mL/min (A) (by C-G formula based on SCr of 0.57 mg/dL (L)). Liver Function Tests: Recent Labs  Lab 04/18/24 1305 04/19/24 0836  AST 30 26  ALT 22 18  ALKPHOS 113 101  BILITOT 0.4 0.5  PROT 8.7* 8.0  ALBUMIN  3.9 3.6   No results for input(s): LIPASE, AMYLASE in the last 168 hours. No results for input(s): AMMONIA in the last 168 hours. Coagulation Profile: Recent Labs  Lab 04/18/24 1439  INR 1.1   Cardiac Enzymes: No results for input(s): CKTOTAL, CKMB, CKMBINDEX, TROPONINI in the last 168 hours. BNP (last 3 results) No results for input(s): PROBNP in the last 8760  hours. HbA1C: Recent Labs    04/19/24 0836  HGBA1C 7.1*   CBG: Recent Labs  Lab 04/18/24 2135 04/19/24 0754 04/19/24 1112  GLUCAP 187* 143* 132*   Lipid Profile: No results for input(s): CHOL, HDL, LDLCALC, TRIG, CHOLHDL, LDLDIRECT in the last 72 hours. Thyroid Function Tests: No results for input(s): TSH, T4TOTAL, FREET4, T3FREE, THYROIDAB in the last 72 hours. Anemia Panel: No results for input(s): VITAMINB12, FOLATE, FERRITIN, TIBC, IRON, RETICCTPCT in the last 72 hours. Sepsis Labs: Recent Labs  Lab 04/18/24 1444  LATICACIDVEN 1.2    Recent Results (from the past 240 hours)  Blood Culture (routine x 2)     Status: None (Preliminary result)   Collection Time: 04/18/24  1:05 PM  Specimen: BLOOD  Result Value Ref Range Status   Specimen Description   Final    BLOOD RIGHT ANTECUBITAL Performed at [facility], [address], [place], [place] [address]    Special Requests   Final    BOTTLES DRAWN AEROBIC AND ANAEROBIC Blood Culture results may not be optimal due to an inadequate volume of blood received in culture bottles Performed at [facility], [address], [place], [place] [address]    Culture   Final    NO GROWTH < 24 HOURS Performed at [facility], [address] [place], [place] [address]    Report Status PENDING  Incomplete  Blood Culture (routine x 2)     Status: None (Preliminary result)   Collection Time: 04/18/24  1:10 PM   Specimen: BLOOD  Result Value Ref Range Status   Specimen Description   Final    BLOOD LEFT ANTECUBITAL Performed at [facility], [address], [place], [place] [address]    Special Requests   Final    BOTTLES DRAWN AEROBIC AND ANAEROBIC Blood Culture results may not be optimal due to an inadequate volume of blood received in culture bottles Performed at [facility], [address], [place], [place]  [address]    Culture   Final    NO GROWTH < 24 HOURS Performed at [facility], [address], [place], [place] [address]    Report Status PENDING  Incomplete      Radiology Studies: CT ABDOMEN PELVIS W CONTRAST Result Date: 04/18/2024 EXAM: CT ABDOMEN AND PELVIS WITH CONTRAST 04/18/2024 02:17:32 PM TECHNIQUE: CT of the abdomen and pelvis was performed with the administration of 100 mL of iohexol  (OMNIPAQUE ) 300 MG/ML solution. Multiplanar reformatted images are provided for review. Automated exposure control, iterative reconstruction, and/or weight-based adjustment of the mA/kV was utilized to reduce the radiation dose to as low as reasonably achievable. COMPARISON: CT renal 09/10/2023 CLINICAL HISTORY: Abdominal pain, acute, nonlocalized; Right sided sacral wound. FINDINGS: LOWER CHEST: Coronary artery calcification. LIVER: Subcentimeter hypodense lesions within the liver. GALLBLADDER AND BILE DUCTS: Gallbladder is unremarkable. No biliary ductal dilatation. SPLEEN: No acute abnormality. PANCREAS: Diffusely atrophic pancreas. No focal lesion. Otherwise normal pancreatic contour. No surrounding inflammatory changes. No main pancreatic ductal dilatation. ADRENAL GLANDS: No acute abnormality. KIDNEYS, URETERS AND BLADDER: Low-density lesions of the kidneys likely represent simple renal cysts. Per consensus, no follow-up is needed for simple Bosniak type 1 and 2 renal cysts, unless the patient has a malignancy history or risk factors. No stones in the kidneys or ureters. No hydronephrosis. No perinephric or periureteral stranding. Suprapubic catheter with decompressed urinary bladder lumen. GI AND BOWEL: Tiny hiatal hernia. No small or large bowel thickening or dilatation. The appendix is unremarkable. Increased stool burden throughout the colon. PERITONEUM AND RETROPERITONEUM: No ascites. No free air. VASCULATURE: Aorta is normal in caliber. LYMPH NODES: No lymphadenopathy. REPRODUCTIVE ORGANS: No  acute abnormality. BONES AND SOFT TISSUES: Right femoral hardware with total right arthroplasty. Diffusely decreased bone density. Multilevel severe degenerative changes of the spine. Chronic L2 compression fracture. Dextroscoliosis of the thoracolumbar spine centered at the L2-L3 level. Limited evaluation of the pelvis due to scatter artifact originating from the right femoral hardware. Sofft tissue edema and dermal thickening along the right ischial tuberosity with associated soft tissue emphysema and underlying cortical destruction of the right ischial tuberosity (2.91). IMPRESSION: 1. Right ischial tuberosity osteomyelitis with overlying decubitus ulcer. 2. Constipation.  Electronically signed by: Morgane Naveau MD 04/18/2024 03:53 PM EST RP Workstation: [id number]      Scheduled Meds:  baclofen   25 mg Oral QHS   [START ON 04/22/2024] collagenase   Topical Daily   enoxaparin  (LOVENOX ) injection  40 mg Subcutaneous Q24H   insulin  aspart  0-5 Units Subcutaneous QHS   insulin  aspart  0-9 Units Subcutaneous TID WC   levothyroxine   50 mcg Oral QAC breakfast   liver oil-zinc  oxide   Topical BID   sodium hypochlorite   Irrigation Daily   Continuous Infusions:  ceFEPime  (MAXIPIME ) IV 2 g (04/19/24 1153)   vancomycin      vancomycin        LOS: 0 days   Time spent: 50 minutes   Edward Hoe, DO [facility] 04/19/2024, 2:16 PM   Available via Epic secure chat 7am-7pm After these hours, please refer to coverage provider listed on [email or web address]  "

## 2024-04-19 NOTE — Discharge Instructions (Signed)
 Tips For Adding Protein  Patients may be advised to increase the protein in their diet but not necessarily the calories as well. However, note that when adding protein to your diet, you will also be adding extra calories. The following suggestions may help add the extra protein while keeping the calories as low as possible.  Tips Add extra egg to one or more meals  Increase the portion of milk to drink and change to skim milk if able  Include Greek yogurt or cottage cheese for snack or part of a meal  Increase portion size of protein entre and decrease portion of starch/bread  Mix protein powder, nut butter, almond/nut milk, non-fat dry milk, or Greek yogurt to shakes and smoothies  Use these ingredients also in baked goods or other recipes Use double the amount of sandwich filling  Add protein foods to all snacks including cheese, nut butters, milk and yogurt Food Tips for Including Protein  Beans Cook and use dried peas, beans, and tofu in soups or add to casseroles, pastas, and grain dishes that also contain cheese or meat  Mash with cheese and milk  Use tofu to make smoothies  Commercial Protein Supplements Use nutritional supplements or protein powder sold at pharmacies and grocery stores  Use protein powder in milk drinks and desserts, such as pudding  Mix with ice cream, milk, and fruit or other flavorings for a high-protein milkshake  Cottage Cheese or [employer] Mix with or use to stuff fruits and vegetables  Add to casseroles, spaghetti, noodles, or egg dishes such as omelets, scrambled eggs, and souffls  Use gelatin, pudding-type desserts, cheesecake, and pancake or waffle batter  Use to stuff crepes, pasta shells, or manicotti  Puree and use as a substitute for sour cream  Eggs, Egg whites, and Egg Yolks Add chopped, hard-cooked eggs to salads and dressings, vegetables, casseroles, and creamed meats  Beat eggs into mashed potatoes, vegetable purees, and sauces  Add extra  egg whites to quiches, scrambled eggs, custards, puddings, pancake batter, or French toast wash/batter  Make a rich custard with egg yolks, double strength milk, and sugar  Add extra hard-cooked yolks to deviled egg filling and sandwich spreads  Hard or Semi-Soft Cheese (Cheddar, [place], [place]) Melt on sandwiches, bread, muffins, tortillas, hamburgers, hot dogs, other meats or fish, vegetables, eggs, or desserts such as stewed figs or pies  Grate and add to soups, sauces, casseroles, vegetable dishes, potatoes, rice noodles, or meatloaf  Serve as a snack with crackers or bagels  Ice cream, Yogurt, and Frozen Yogurt Add to milk drinks such as milkshakes  Add to cereals, fruits, gelatin desserts, and pies  Blend or whip with soft or cooked fruits  Sandwich ice cream or frozen yogurt between enriched cake slices, cookies, or graham crackers  Use seasoned yogurt as a dip for fruits, vegetables, or chips  Use yogurt in place of sour cream in casseroles  Meat and Fish Add chopped, cooked meat or fish to vegetables, salads, casseroles, soups, sauces, and biscuit dough  Use in omelets, souffls, quiches, and sandwich fillings  Add chicken and turkey to stuffing  Wrap in pie crust or biscuit dough as turnovers  Add to stuffed baked potatoes  Add pureed meat to soups  Milk Use in beverages and in cooking  Use in preparing foods, such as hot cereal, soups, cocoa, or pudding  Add cream sauces to vegetable and other dishes  Use evaporated milk, evaporated skim milk, or sweetened condensed milk instead  of milk or water  in recipes.  Nonfat Dry Milk Add 1/3 cup of nonfat dry milk powdered milk to each cup of regular milk for double strength milk  Add to yogurt and milk drinks, such as pasteurized eggnog and milkshakes  Add to scrambled eggs and mashed potatoes  Use in casseroles, meatloaf, hot cereal, breads, muffins, sauces, cream soups, puddings and custards, and other milk-based desserts  Nuts, Seeds,  and Wheat Germ Add to casseroles, breads, muffins, pancakes, cookies, and waffles  Sprinkle on fruit, cereal, ice cream, yogurt, vegetables, salads, and toast as a crunchy topping  Use in place of breadcrumbs  Blend with parsley or spinach, herbs, and cream for a noodle, pasta, or vegetable sauce.  Roll banana in chopped nuts  Peanut Butter Spread on sandwiches, toast, muffins, crackers, waffles, pancakes, and fruit slices  Use as a dip for raw vegetables, such as carrots, cauliflower, and celery  Blend with milk drinks, smoothies, and other beverages  Swirl through soft ice cream or yogurt  Spread on a banana then roll in crushed, dry cereal or chopped nuts   Copyright 2020  [employer]. All rights reserved.Tips For Adding Protein  Patients may be advised to increase the protein in their diet but not necessarily the calories as well. However, note that when adding protein to your diet, you will also be adding extra calories. The following suggestions may help add the extra protein while keeping the calories as low as possible.  Tips Add extra egg to one or more meals  Increase the portion of milk to drink and change to skim milk if able  Include Greek yogurt or cottage cheese for snack or part of a meal  Increase portion size of protein entre and decrease portion of starch/bread  Mix protein powder, nut butter, almond/nut milk, non-fat dry milk, or Greek yogurt to shakes and smoothies  Use these ingredients also in baked goods or other recipes Use double the amount of sandwich filling  Add protein foods to all snacks including cheese, nut butters, milk and yogurt Food Tips for Including Protein  Beans Cook and use dried peas, beans, and tofu in soups or add to casseroles, pastas, and grain dishes that also contain cheese or meat  Mash with cheese and milk  Use tofu to make smoothies  Commercial Protein Supplements Use nutritional supplements or protein powder  sold at pharmacies and grocery stores  Use protein powder in milk drinks and desserts, such as pudding  Mix with ice cream, milk, and fruit or other flavorings for a high-protein milkshake  Cottage Cheese or [employer] Mix with or use to stuff fruits and vegetables  Add to casseroles, spaghetti, noodles, or egg dishes such as omelets, scrambled eggs, and souffls  Use gelatin, pudding-type desserts, cheesecake, and pancake or waffle batter  Use to stuff crepes, pasta shells, or manicotti  Puree and use as a substitute for sour cream  Eggs, Egg whites, and Egg Yolks Add chopped, hard-cooked eggs to salads and dressings, vegetables, casseroles, and creamed meats  Beat eggs into mashed potatoes, vegetable purees, and sauces  Add extra egg whites to quiches, scrambled eggs, custards, puddings, pancake batter, or French toast wash/batter  Make a rich custard with egg yolks, double strength milk, and sugar  Add extra hard-cooked yolks to deviled egg filling and sandwich spreads  Hard or Semi-Soft Cheese (Cheddar, [place], [place]) Melt on sandwiches, bread, muffins, tortillas, hamburgers, hot dogs, other meats or fish, vegetables, eggs,  or desserts such as stewed figs or pies  Grate and add to soups, sauces, casseroles, vegetable dishes, potatoes, rice noodles, or meatloaf  Serve as a snack with crackers or bagels  Ice cream, Yogurt, and Frozen Yogurt Add to milk drinks such as milkshakes  Add to cereals, fruits, gelatin desserts, and pies  Blend or whip with soft or cooked fruits  Sandwich ice cream or frozen yogurt between enriched cake slices, cookies, or graham crackers  Use seasoned yogurt as a dip for fruits, vegetables, or chips  Use yogurt in place of sour cream in casseroles  Meat and Fish Add chopped, cooked meat or fish to vegetables, salads, casseroles, soups, sauces, and biscuit dough  Use in omelets, souffls, quiches, and sandwich fillings  Add chicken and turkey to stuffing  Wrap in  pie crust or biscuit dough as turnovers  Add to stuffed baked potatoes  Add pureed meat to soups  Milk Use in beverages and in cooking  Use in preparing foods, such as hot cereal, soups, cocoa, or pudding  Add cream sauces to vegetable and other dishes  Use evaporated milk, evaporated skim milk, or sweetened condensed milk instead of milk or water  in recipes.  Nonfat Dry Milk Add 1/3 cup of nonfat dry milk powdered milk to each cup of regular milk for double strength milk  Add to yogurt and milk drinks, such as pasteurized eggnog and milkshakes  Add to scrambled eggs and mashed potatoes  Use in casseroles, meatloaf, hot cereal, breads, muffins, sauces, cream soups, puddings and custards, and other milk-based desserts  Nuts, Seeds, and Wheat Germ Add to casseroles, breads, muffins, pancakes, cookies, and waffles  Sprinkle on fruit, cereal, ice cream, yogurt, vegetables, salads, and toast as a crunchy topping  Use in place of breadcrumbs  Blend with parsley or spinach, herbs, and cream for a noodle, pasta, or vegetable sauce.  Roll banana in chopped nuts  Peanut Butter Spread on sandwiches, toast, muffins, crackers, waffles, pancakes, and fruit slices  Use as a dip for raw vegetables, such as carrots, cauliflower, and celery  Blend with milk drinks, smoothies, and other beverages  Swirl through soft ice cream or yogurt  Spread on a banana then roll in crushed, dry cereal or chopped nuts   Copyright 2020  [employer]y [employer]. All rights reserved.
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
## 2024-04-19 NOTE — Consult Note (Signed)
 WOC Nurse Consult Note: CT scan confirms osteomyelitis R ischial tuberosity  Reason for Consult: ischial wound  Wound type: 1.  Stage 4 Pressure Injury R ischium 50% red 50% tan/fibrinous tissue  2.  Stage 2 Pressure Injury R posterior heel red  Pressure Injury POA: Yes Measurement: see nursing flowsheet  Wound bed:as above  Drainage (amount, consistency, odor) purulent per MD note  Periwound: mild erythema  Dressing procedure/placement/frequency:  Cleanse R ischial wound with Vashe, do not rinse. Using a Q tip applicator pack with Dakin's moistened Kerlix making sure to cover areas of undermining/depth, cover with dry gauze and secure with silicone foam or ABD pad and clothe tape whichever is preferred. After 3 days of Dakins will switch to Santyl for continued debridement.  Cleanse heels with soap and water , apply silicone foam heel protector. Place B heels into Prevalon boots Soila [phone]) to offload pressure.   Patient should be placed on a low air loss mattress for pressure redistribution and moisture management.   POC discussed with bedside nurse. WOC team will not follow. Reconsult if further needs arise.   Thank you    Powell Bar MSN, RN-BC, CWOCN
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
## 2024-04-19 NOTE — Progress Notes (Signed)
 Pt refused morning labs.

## 2024-04-19 NOTE — Plan of Care (Signed)
" °  Problem: Nutritional: Goal: Progress toward achieving an optimal weight will improve Outcome: Progressing   Problem: Safety: Goal: Ability to remain free from injury will improve Outcome: Progressing   Problem: Pain Managment: Goal: General experience of comfort will improve and/or be controlled Outcome: Progressing   "
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
## 2024-04-20 ENCOUNTER — Inpatient Hospital Stay (HOSPITAL_COMMUNITY)

## 2024-04-20 DIAGNOSIS — Z515 Encounter for palliative care: Secondary | ICD-10-CM | POA: Diagnosis not present

## 2024-04-20 DIAGNOSIS — M869 Osteomyelitis, unspecified: Secondary | ICD-10-CM | POA: Diagnosis not present

## 2024-04-20 DIAGNOSIS — Z711 Person with feared health complaint in whom no diagnosis is made: Secondary | ICD-10-CM | POA: Diagnosis not present

## 2024-04-20 DIAGNOSIS — Z7189 Other specified counseling: Secondary | ICD-10-CM | POA: Diagnosis not present

## 2024-04-20 HISTORY — PX: IR CATHETER TUBE CHANGE: IMG717

## 2024-04-20 LAB — URINE CULTURE: Culture: 70000 — AB

## 2024-04-20 LAB — COPPER, SERUM: Copper: 94 ug/dL (ref 69–132)

## 2024-04-20 LAB — GLUCOSE, CAPILLARY
Glucose-Capillary: 138 mg/dL — ABNORMAL HIGH (ref 70–99)
Glucose-Capillary: 131 mg/dL — ABNORMAL HIGH (ref 70–99)
Glucose-Capillary: 129 mg/dL — ABNORMAL HIGH (ref 70–99)
Glucose-Capillary: 141 mg/dL — ABNORMAL HIGH (ref 70–99)
Glucose-Capillary: 177 mg/dL — ABNORMAL HIGH (ref 70–99)

## 2024-04-20 LAB — ZINC: Zinc: 54 ug/dL (ref 44–115)

## 2024-04-20 MED ORDER — LIDOCAINE VISCOUS HCL 2 % MT SOLN
15.0000 mL | Freq: Once | OROMUCOSAL | Status: AC
  Administered 2024-04-20: 3 mL via OROMUCOSAL
  Filled 2024-04-20: qty 15

## 2024-04-20 MED ORDER — LIDOCAINE VISCOUS HCL 2 % MT SOLN
OROMUCOSAL | Status: AC
  Filled 2024-04-20: qty 15
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
## 2024-04-20 NOTE — Procedures (Signed)
 Patient ID: Edward Waller, male   DOB: [birth year], 89 y.o.   MRN: [id number] IR consulted for routine suprapubic catheter exchange. Last exchanged by IR 10/29/23. Catheter exchanged at bedside with new 16Fr council tip today.  Viscous lidocaine  was applied to the tract. 8cc saline was removed from old catheter balloon, and was removed without resistance. A new 16Fr council tip catheter was then placed into the tract and placed into the bladder without resistance. Balloon was inflated with 10cc saline. Catheter bag and tubing also changed. Urine seen flowing freely. Site was dressed appropriately. Patient tolerated well without complication.  Please reach out to IR with any future questions or concerns.   Kimble DEL Jacoby Zanni PA-C 04/20/2024 4:23 PM
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
## 2024-04-20 NOTE — Progress Notes (Signed)
" °  PROGRESS NOTE  Checked in on patient this afternoon. They just had IR come and exchange the suprapubic catheter which went well. Wife is at bedside. She tells me that they plan to meet with palliative care again tomorrow along with their daughter and granddaughter. Questions answered today.    Delon Hoe, DO [facility] 04/20/2024, 4:32 PM  Available via Epic secure chat 7am-7pm After these hours, please refer to coverage provider listed on [email or web address]   "

## 2024-04-20 NOTE — Progress Notes (Signed)
 PT Cancellation Note  Patient Details Name: Edward Waller MRN: [id number] DOB: November 17, 1932   Cancelled Treatment:    Reason Eval/Treat Not Completed: Other (comment)  Per PT notes from 2024, pt is total care at baseline (did 2 stents at [facility]  prior to this), has sky lift at home, recently at [facility]. GOC pending.  PT signing off  [facility] 04/20/2024, 9:30 AM

## 2024-04-20 NOTE — Progress Notes (Addendum)
 " PROGRESS NOTE    Edward Waller  FMW:[id number] DOB: 23-Aug-1932 DOA: 04/18/2024 PCP: [facility]     Brief Narrative:  Edward Waller is a 89 y.o. male with medical history significant of osteoarthritis, bilateral hand pain, history of DVT, grade 1 diastolic dysfunction, squamous cell carcinoma of the scalp, bilateral cataracts, left ankle fracture, chronic right hip pain, nephrolithiasis, hypertension, hypothyroidism, liver cyst, renal cyst, chronic lower back pain, BPH, restless syndrome, sleep apnea, spinal stenosis, paraplegia with neurogenic bladder with chronic indwelling suprapubic catheter after accident in September 2024.   Since then, he has been in various LTAC/SNF facilities.  He tells me that he has developed a pressure ulcer, that has worsened over the past couple months.  He was recently living at home by his caretakers.  He returns to the hospital with purulent discharge of his wound.  CT showed right ischial tuberosity osteomyelitis with overlying decubitus ulcer.  He was started on IV antibiotics and admitted to the hospital.  New events last 24 hours / Subjective: We discussed infectious disease recommendation to stop antibiotics.  Discussed with Dr. Overton yesterday evening, there is low rate of success for treatment for confirmed osteomyelitis with chronic sacral decubitus wounds, in setting of irreversible cause for pressure ulcer formation such as his paralysis.  Palliative care conversations ongoing.  This morning, patient's main complaint centered around poor nursing care.   Assessment & Plan:   Principal Problem:   Osteomyelitis of right hip (HCC) Active Problems:   Neurogenic bladder   Restless legs   History of DVT (deep vein thrombosis)   Hypothyroidism   BPH (benign prostatic hyperplasia)   Non-insulin  dependent type 2 diabetes mellitus (HCC)   Grade I diastolic dysfunction   Pressure injury of buttock   Chronic wound   Osteomyelitis of  right ischial tuberosity - With underlying stage IV pressure ulcer, present on admission - Wound RN consulted, wound management orders in place  - Low air loss mattress ordered - Poor prognosis overall. Palliative care conversation ongoing   Diabetes mellitus - Hemoglobin A1c 7.1 - Sliding scale insulin   Chronic diastolic dysfunction - Stable  Paraplegia with neurogenic bladder - Suprapubic catheter in place. Family notes he is due to have catheter exchanged. It was previously placed by IR. IR consulted for replacement.  - Baclofen   Hypothyroidism - Synthroid    In agreement with assessment of the pressure ulcer as below:  Wound 04/18/24 1852 Pressure Injury Ischial tuberosity Right Stage 4 - Full thickness tissue loss with exposed bone, tendon or muscle. (Active)     Wound 04/18/24 1853 Pressure Injury Ankle Posterior;Right Stage 2 -  Partial thickness loss of dermis presenting as a shallow open injury with a red, pink wound bed without slough. (Active)     Nutrition Problem: Increased nutrient needs Etiology: wound healing   DVT prophylaxis:  enoxaparin  (LOVENOX ) injection 40 mg Start: 04/18/24 2200  Code Status: DNR  Family Communication: None at bedside Disposition Plan: Family requesting [facility] in [place]   Status is: Inpatient Remains inpatient appropriate   Antimicrobials:  Anti-infectives (From admission, onward)    Start     Dose/Rate Route Frequency Ordered Stop   04/19/24 1800  vancomycin  (VANCOREADY) IVPB 1500 mg/300 mL  Status:  Discontinued        1,500 mg 150 mL/hr over 120 Minutes Intravenous Every 24 hours 04/18/24 1747 04/19/24 1444   04/18/24 2000  ceFEPIme  (MAXIPIME ) 2 g in sodium chloride  0.9 % 100 mL  IVPB  Status:  Discontinued        2 g 200 mL/hr over 30 Minutes Intravenous Every 8 hours 04/18/24 1736 04/19/24 1444   04/18/24 1745  vancomycin  (VANCOCIN ) IVPB 1000 mg/200 mL premix  Status:  Discontinued        1,000 mg 200  mL/hr over 60 Minutes Intravenous  Once 04/18/24 1736 04/19/24 1444   04/18/24 1615  vancomycin  (VANCOCIN ) IVPB 1000 mg/200 mL premix        1,000 mg 200 mL/hr over 60 Minutes Intravenous  Once 04/18/24 1601 04/19/24 0844   04/18/24 1615  cefTRIAXone  (ROCEPHIN ) 1 g in sodium chloride  0.9 % 100 mL IVPB        1 g 200 mL/hr over 30 Minutes Intravenous  Once 04/18/24 1601 04/19/24 0744        Objective: Vitals:   04/19/24 0854 04/19/24 1344 04/19/24 2125 04/20/24 0649  BP:  131/69 (!) 159/75 135/66  Pulse:  66 69 63  Resp:  17 18 16   Temp:  98.4 F (36.9 C) 98.1 F (36.7 C) 98.1 F (36.7 C)  TempSrc:  Oral Oral Oral  SpO2:  98% 99% 96%  Weight:      Height: 6' (1.829 m)       Intake/Output Summary (Last 24 hours) at 04/20/2024 1404 Last data filed at 04/20/2024 1147 Gross per 24 hour  Intake 320 ml  Output 2125 ml  Net -1805 ml   Filed Weights   04/18/24 1725  Weight: 83 kg    Examination:  General exam: Appears calm and comfortable  Respiratory system: Respiratory effort normal. No respiratory distress. No conversational dyspnea.  Gastrointestinal system: Abdomen is nondistended, soft  Central nervous system: Alert and oriented. Paraplegia  Psychiatry: Judgement and insight appear normal. Mood & affect appropriate.    Data Reviewed: I have personally reviewed following labs and imaging studies  CBC: Recent Labs  Lab 04/18/24 1305 04/19/24 0836  WBC 8.0 5.8  NEUTROABS 5.7  --   HGB 15.0 14.1  HCT 46.6 43.6  MCV 96.3 94.2  PLT 272 272   Basic Metabolic Panel: Recent Labs  Lab 04/18/24 1305 04/19/24 0836  NA 138 137  K 4.5 4.2  CL 102 104  CO2 28 20*  GLUCOSE 132* 143*  BUN 15 12  CREATININE 0.59* 0.57*  CALCIUM  9.8 9.4   GFR: Estimated Creatinine Clearance: 68.7 mL/min (A) (by C-G formula based on SCr of 0.57 mg/dL (L)). Liver Function Tests: Recent Labs  Lab 04/18/24 1305 04/19/24 0836  AST 30 26  ALT 22 18  ALKPHOS 113 101  BILITOT  0.4 0.5  PROT 8.7* 8.0  ALBUMIN  3.9 3.6   No results for input(s): LIPASE, AMYLASE in the last 168 hours. No results for input(s): AMMONIA in the last 168 hours. Coagulation Profile: Recent Labs  Lab 04/18/24 1439  INR 1.1   Cardiac Enzymes: No results for input(s): CKTOTAL, CKMB, CKMBINDEX, TROPONINI in the last 168 hours. BNP (last 3 results) No results for input(s): PROBNP in the last 8760 hours. HbA1C: Recent Labs    04/19/24 0836  HGBA1C 7.1*   CBG: Recent Labs  Lab 04/19/24 1112 04/19/24 1648 04/19/24 2127 04/20/24 0729 04/20/24 1130  GLUCAP 132* 138* 182* 131* 129*   Lipid Profile: No results for input(s): CHOL, HDL, LDLCALC, TRIG, CHOLHDL, LDLDIRECT in the last 72 hours. Thyroid Function Tests: No results for input(s): TSH, T4TOTAL, FREET4, T3FREE, THYROIDAB in the last 72 hours. Anemia Panel: No results for  input(s): VITAMINB12, FOLATE, FERRITIN, TIBC, IRON, RETICCTPCT in the last 72 hours. Sepsis Labs: Recent Labs  Lab 04/18/24 1444  LATICACIDVEN 1.2    Recent Results (from the past 240 hours)  Blood Culture (routine x 2)     Status: None (Preliminary result)   Collection Time: 04/18/24  1:05 PM   Specimen: BLOOD  Result Value Ref Range Status   Specimen Description   Final    BLOOD RIGHT ANTECUBITAL Performed at [facility], [address], [place], [place] [address]    Special Requests   Final    BOTTLES DRAWN AEROBIC AND ANAEROBIC Blood Culture results may not be optimal due to an inadequate volume of blood received in culture bottles Performed at [facility], [address] [place], [place] [address]    Culture   Final    NO GROWTH 2 DAYS Performed at [facility], [address] [place], [place] [address]    Report Status PENDING  Incomplete  Blood Culture (routine x 2)     Status: None (Preliminary result)   Collection Time: 04/18/24  1:10  PM   Specimen: BLOOD  Result Value Ref Range Status   Specimen Description   Final    BLOOD LEFT ANTECUBITAL Performed at [facility], [address] [place], [place] [address]    Special Requests   Final    BOTTLES DRAWN AEROBIC AND ANAEROBIC Blood Culture results may not be optimal due to an inadequate volume of blood received in culture bottles Performed at [facility], [address] [place], [place] [address]    Culture   Final    NO GROWTH 2 DAYS Performed at [facility], [address], [place], [place] [address]    Report Status PENDING  Incomplete  Urine Culture     Status: Abnormal   Collection Time: 04/18/24  1:27 PM   Specimen: Urine, Random  Result Value Ref Range Status   Specimen Description   Final    URINE, RANDOM Performed at [facility], [address], [place], [place] [address]    Special Requests   Final    NONE Reflexed from [id number] Performed at [facility], [address], [place], [place] [address]    Culture (A)  Final    70,000 COLONIES/mL ENTEROCOCCUS FAECALIS VANCOMYCIN  RESISTANT ENTEROCOCCUS ISOLATED 50,000 COLONIES/mL ACINETOBACTER CALCOACETICUS/BAUMANNII COMPLEX    Report Status 04/20/2024 FINAL  Final   Organism ID, Bacteria ENTEROCOCCUS FAECALIS (A)  Final   Organism ID, Bacteria ACINETOBACTER CALCOACETICUS/BAUMANNII COMPLEX (A)  Final      Susceptibility   Acinetobacter calcoaceticus/baumannii complex - MIC*    MINOCYCLINE <=0.5 SENSITIVE Sensitive     IMIPENEM <=0.5 SENSITIVE Sensitive     PIP/TAZO Value in next row Sensitive      <=4 SENSITIVEThis is a modified FDA-approved test that has been validated and its performance characteristics determined by the reporting laboratory.  This laboratory is certified under the Clinical Laboratory Improvement Amendments CLIA as qualified to perform high complexity clinical laboratory testing.     AMPICILLIN/SULBACTAM Value in next row Sensitive      <=4 SENSITIVEThis is a modified FDA-approved test that has been validated and its performance characteristics determined by the reporting laboratory.  This laboratory is certified under the Clinical Laboratory Improvement Amendments CLIA as qualified to perform high complexity clinical laboratory testing.    MEROPENEM  Value in next row Sensitive      <=  4 SENSITIVEThis is a modified FDA-approved test that has been validated and its performance characteristics determined by the reporting laboratory.  This laboratory is certified under the Clinical Laboratory Improvement Amendments CLIA as qualified to perform high complexity clinical laboratory testing.    * 50,000 COLONIES/mL ACINETOBACTER CALCOACETICUS/BAUMANNII COMPLEX   Enterococcus faecalis - MIC*    AMPICILLIN Value in next row Sensitive      <=4 SENSITIVEThis is a modified FDA-approved test that has been validated and its performance characteristics determined by the reporting laboratory.  This laboratory is certified under the Clinical Laboratory Improvement Amendments CLIA as qualified to perform high complexity clinical laboratory testing.    NITROFURANTOIN Value in next row Sensitive      <=4 SENSITIVEThis is a modified FDA-approved test that has been validated and its performance characteristics determined by the reporting laboratory.  This laboratory is certified under the Clinical Laboratory Improvement Amendments CLIA as qualified to perform high complexity clinical laboratory testing.    VANCOMYCIN  Value in next row Resistant      <=4 SENSITIVEThis is a modified FDA-approved test that has been validated and its performance characteristics determined by the reporting laboratory.  This laboratory is certified under the Clinical Laboratory Improvement Amendments CLIA as qualified to perform high complexity clinical laboratory testing.    GENTAMICIN SYNERGY Value in next row Sensitive       <=4 SENSITIVEThis is a modified FDA-approved test that has been validated and its performance characteristics determined by the reporting laboratory.  This laboratory is certified under the Clinical Laboratory Improvement Amendments CLIA as qualified to perform high complexity clinical laboratory testing.    LINEZOLID Value in next row Sensitive      <=4 SENSITIVEThis is a modified FDA-approved test that has been validated and its performance characteristics determined by the reporting laboratory.  This laboratory is certified under the Clinical Laboratory Improvement Amendments CLIA as qualified to perform high complexity clinical laboratory testing.    * 70,000 COLONIES/mL ENTEROCOCCUS FAECALIS      Radiology Studies: CT ABDOMEN PELVIS W CONTRAST Result Date: 04/18/2024 EXAM: CT ABDOMEN AND PELVIS WITH CONTRAST 04/18/2024 02:17:32 PM TECHNIQUE: CT of the abdomen and pelvis was performed with the administration of 100 mL of iohexol  (OMNIPAQUE ) 300 MG/ML solution. Multiplanar reformatted images are provided for review. Automated exposure control, iterative reconstruction, and/or weight-based adjustment of the mA/kV was utilized to reduce the radiation dose to as low as reasonably achievable. COMPARISON: CT renal 09/10/2023 CLINICAL HISTORY: Abdominal pain, acute, nonlocalized; Right sided sacral wound. FINDINGS: LOWER CHEST: Coronary artery calcification. LIVER: Subcentimeter hypodense lesions within the liver. GALLBLADDER AND BILE DUCTS: Gallbladder is unremarkable. No biliary ductal dilatation. SPLEEN: No acute abnormality. PANCREAS: Diffusely atrophic pancreas. No focal lesion. Otherwise normal pancreatic contour. No surrounding inflammatory changes. No main pancreatic ductal dilatation. ADRENAL GLANDS: No acute abnormality. KIDNEYS, URETERS AND BLADDER: Low-density lesions of the kidneys likely represent simple renal cysts. Per consensus, no follow-up is needed for simple Bosniak type 1 and 2 renal  cysts, unless the patient has a malignancy history or risk factors. No stones in the kidneys or ureters. No hydronephrosis. No perinephric or periureteral stranding. Suprapubic catheter with decompressed urinary bladder lumen. GI AND BOWEL: Tiny hiatal hernia. No small or large bowel thickening or dilatation. The appendix is unremarkable. Increased stool burden throughout the colon. PERITONEUM AND RETROPERITONEUM: No ascites. No free air. VASCULATURE: Aorta is normal in caliber. LYMPH NODES: No lymphadenopathy. REPRODUCTIVE ORGANS: No acute abnormality. BONES AND SOFT TISSUES: Right femoral hardware  with total right arthroplasty. Diffusely decreased bone density. Multilevel severe degenerative changes of the spine. Chronic L2 compression fracture. Dextroscoliosis of the thoracolumbar spine centered at the L2-L3 level. Limited evaluation of the pelvis due to scatter artifact originating from the right femoral hardware. Sofft tissue edema and dermal thickening along the right ischial tuberosity with associated soft tissue emphysema and underlying cortical destruction of the right ischial tuberosity (2.91). IMPRESSION: 1. Right ischial tuberosity osteomyelitis with overlying decubitus ulcer. 2. Constipation. Electronically signed by: Morgane Naveau MD 04/18/2024 03:53 PM EST RP Workstation: [id number]      Scheduled Meds:  ascorbic acid   500 mg Oral BID   baclofen   25 mg Oral QHS   [START ON 04/22/2024] collagenase   Topical Daily   enoxaparin  (LOVENOX ) injection  40 mg Subcutaneous Q24H   insulin  aspart  0-5 Units Subcutaneous QHS   insulin  aspart  0-9 Units Subcutaneous TID WC   levothyroxine   50 mcg Oral QAC breakfast   liver oil-zinc  oxide   Topical BID   multivitamin with minerals  1 tablet Oral Daily   nutrition supplement (JUVEN)  1 packet Oral BID BM   sodium hypochlorite   Irrigation Daily   zinc  sulfate (50mg  elemental zinc )  220 mg Oral Daily   Continuous Infusions:     LOS: 1 day    Time spent: 25 minutes   Delon Hoe, DO [facility] 04/20/2024, 2:04 PM   Available via Epic secure chat 7am-7pm After these hours, please refer to coverage provider listed on [email or web address]  "

## 2024-04-20 NOTE — TOC Progression Note (Addendum)
 Transition of Care [facility]) - Progression Note    Patient Details  Name: Edward Waller MRN: [id number] Date of Birth: 1932/09/09  [facility] CM/SW Contact  Alfonse JONELLE Rex, RN Phone Number: 04/20/2024, 12:50 PM  Clinical Narrative:   [facility] consult for SNF, pt's spouse requesting  [facility] in [place]. NCM called to facility at  [phone], transferred to admissions, voice mail left for [facility], admissions coordinator,  left CM name and phone number for call back for admissions process, await call back.   -2;45pm Call to [facility] in [place], spoke with [name], admissions coordinator, confirmed if patient is 70-100% service connected, his [place] insurance will cover at 100%, unfortunately they have capped out for those beds and there is a wait list for them as well as the other 3 similar VA facilities in other counties. Since they are capped out on his VA qualifications, they will not accept private pay and only accept Traditional Medicare A/B, no managed Medicare Plans . NCM met with patient's spouse at bedside, updated on no beds available at facility she requested, after discussion, spouse agreeable for NCM to initiate auth for other VA contracted SNF. SNF auth requested/faxed to [place]. MD discussed  possible Home w/hospice with pt/spouse as option    Expected Discharge Plan: Skilled Nursing Facility Barriers to Discharge: Continued Medical Work up               Expected Discharge Plan and Services       Living arrangements for the past 2 months: Single Family Home                                       Social Drivers of Health (SDOH) Interventions SDOH Screenings   Food Insecurity: No Food Insecurity (04/18/2024)  Housing: Low Risk (04/18/2024)  Transportation Needs: Unmet Transportation Needs (04/18/2024)  Utilities: Not At Risk (04/18/2024)  Social Connections: Socially Integrated (04/18/2024)  Tobacco Use: Low Risk (04/18/2024)     Readmission Risk Interventions    09/14/2023    4:46 PM 07/22/2023    2:34 PM  Readmission Risk Prevention Plan  Transportation Screening Complete Complete  PCP or Specialist Appt within 3-5 Days Complete Complete  HRI or Home Care Consult Complete Complete  Social Work Consult for Recovery Care Planning/Counseling Complete Complete  Palliative Care Screening Not Applicable Not Applicable  Medication Review Oceanographer) Complete Complete

## 2024-04-21 DIAGNOSIS — Z66 Do not resuscitate: Secondary | ICD-10-CM | POA: Diagnosis not present

## 2024-04-21 DIAGNOSIS — Z515 Encounter for palliative care: Secondary | ICD-10-CM | POA: Diagnosis not present

## 2024-04-21 DIAGNOSIS — Z7189 Other specified counseling: Secondary | ICD-10-CM | POA: Diagnosis not present

## 2024-04-21 LAB — VITAMIN A: Vitamin A (Retinoic Acid): 44.3 ug/dL (ref 22.0–69.5)

## 2024-04-21 LAB — GLUCOSE, CAPILLARY: Glucose-Capillary: 144 mg/dL — ABNORMAL HIGH (ref 70–99)

## 2024-04-21 MED ORDER — BACLOFEN 10 MG PO TABS
5.0000 mg | ORAL_TABLET | Freq: Two times a day (BID) | ORAL | Status: DC
  Administered 2024-04-21 – 2024-04-24 (×6): 5 mg via ORAL
  Filled 2024-04-21 (×6): qty 1

## 2024-04-21 NOTE — Plan of Care (Signed)
   Problem: Education: Goal: Knowledge of General Education information will improve Description: Including pain rating scale, medication(s)/side effects and non-pharmacologic comfort measures Outcome: Progressing   Problem: Coping: Goal: Level of anxiety will decrease Outcome: Progressing
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
## 2024-04-21 NOTE — Plan of Care (Signed)
  Problem: Coping: Goal: Level of anxiety will decrease Outcome: Progressing   Problem: Skin Integrity: Goal: Risk for impaired skin integrity will decrease Outcome: Progressing   

## 2024-04-21 NOTE — TOC Progression Note (Addendum)
 Transition of Care [facility]) - Progression Note    Patient Details  Name: Edward Waller MRN: [id number] Date of Birth: [birth year]  [facility] CM/SW Contact  NORMAN ASPEN, LCSW Phone Number: 04/21/2024, 3:24 PM  Clinical Narrative:     [facility] but it has not yet been reviewed.  They will contact me on Friday with decision if pt qualifies [facility] SN[facility] under [facility].  Also, have left VM for wife to update.  Expected Discharge Plan: Skilled Nursing Facility Barriers to Discharge: Continued Medical Work up               Expected Discharge Plan and Services       Living arrangements for the past 2 months: Single Family Home                                       Social Drivers of Health (SDOH) Interventions SDOH Screenings   Food Insecurity: No Food Insecurity (04/18/2024)  Housing: Low Risk (04/18/2024)  Transportation Needs: Unmet Transportation Needs (04/18/2024)  Utilities: Not At Risk (04/18/2024)  Social Connections: Socially Integrated (04/18/2024)  Stress: No Stress Concern Present (01/23/2024)   Received from [facility]  Tobacco Use: Low Risk (04/18/2024)    Readmission Risk Interventions    04/21/2024    3:23 PM 09/14/2023    4:46 PM 07/22/2023    2:34 PM  Readmission Risk Prevention Plan  Post Dischage Appt Complete    Medication Screening Complete    Transportation Screening Complete Complete Complete  PCP or Specialist Appt within 3-5 Days  Complete Complete  HRI or Home Care Consult  Complete Complete  Social Work Consult for Recovery Care Planning/Counseling  Complete Complete  Palliative Care Screening  Not Applicable Not Applicable  Medication Review [employer])  Complete Complete
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
## 2024-04-22 DIAGNOSIS — M86151 Other acute osteomyelitis, right femur: Secondary | ICD-10-CM

## 2024-04-22 DIAGNOSIS — M869 Osteomyelitis, unspecified: Secondary | ICD-10-CM | POA: Diagnosis not present

## 2024-04-22 DIAGNOSIS — Z515 Encounter for palliative care: Secondary | ICD-10-CM

## 2024-04-22 DIAGNOSIS — G8220 Paraplegia, unspecified: Secondary | ICD-10-CM

## 2024-04-22 LAB — GLUCOSE, CAPILLARY: Glucose-Capillary: 148 mg/dL — ABNORMAL HIGH (ref 70–99)

## 2024-04-22 MED ORDER — MORPHINE SULFATE (CONCENTRATE) 10 MG /0.5 ML PO SOLN
10.0000 mg | ORAL | Status: DC | PRN
  Filled 2024-04-22: qty 0.5
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
## 2024-04-23 DIAGNOSIS — M869 Osteomyelitis, unspecified: Secondary | ICD-10-CM | POA: Diagnosis not present

## 2024-04-23 LAB — CULTURE, BLOOD (ROUTINE X 2)
Culture: NO GROWTH
Culture: NO GROWTH

## 2024-04-23 NOTE — Progress Notes (Signed)
 Edward Waller [address] [facility] Liaison Note  Received request from [place], [facility], for hospice services at home after discharge. Spoke with granddaughter [name] to initiate education related to hospice philosophy, services, and team approach to care. Patient/family verbalized understanding of information given. Per discussion, the plan is for discharge home by PTAR/EMS tomorrow or Sunday.  DME needs discussed. Patient has the following equipment in the home: lift, hospital bed. Patient/family requests the following equipment for delivery: none  Please send signed and completed DNR home with patient/family. Please provide prescriptions at discharge as needed to ensure ongoing symptom management.  [facility] information and contact number provided.   Above information shared with ICM. Please call with any questions or concerns. Thank you for the opportunity to participate in this patient's care.  [name], LPN [facility] Liaison [phone]
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
## 2024-04-23 NOTE — Plan of Care (Signed)
" °  Problem: Education: Goal: Knowledge of General Education information will improve Description: Including pain rating scale, medication(s)/side effects and non-pharmacologic comfort measures Outcome: Progressing   Problem: Health Behavior/Discharge Planning: Goal: Ability to manage health-related needs will improve Outcome: Progressing   Problem: Clinical Measurements: Goal: Ability to maintain clinical measurements within normal limits will improve Outcome: Progressing Goal: Will remain free from infection Outcome: Progressing Goal: Diagnostic test results will improve Outcome: Progressing Goal: Respiratory complications will improve Outcome: Progressing Goal: Cardiovascular complication will be avoided Outcome: Progressing   Problem: Activity: Goal: Risk for activity intolerance will decrease Outcome: Progressing   Problem: Nutrition: Goal: Adequate nutrition will be maintained Outcome: Progressing   Problem: Coping: Goal: Level of anxiety will decrease Outcome: Progressing   Problem: Elimination: Goal: Will not experience complications related to bowel motility Outcome: Progressing Goal: Will not experience complications related to urinary retention Outcome: Progressing   Problem: Pain Managment: Goal: General experience of comfort will improve and/or be controlled Outcome: Progressing   Problem: Safety: Goal: Ability to remain free from injury will improve Outcome: Progressing   Problem: Skin Integrity: Goal: Risk for impaired skin integrity will decrease Outcome: Progressing   Problem: Clinical Measurements: Goal: Ability to avoid or minimize complications of infection will improve Outcome: Progressing   Problem: Skin Integrity: Goal: Skin integrity will improve Outcome: Progressing   Problem: Education: Goal: Ability to describe self-care measures that may prevent or decrease complications (Diabetes Survival Skills Education) will improve Outcome:  Progressing Goal: Individualized Educational Video(s) Outcome: Progressing   Problem: Coping: Goal: Ability to adjust to condition or change in health will improve Outcome: Progressing   Problem: Health Behavior/Discharge Planning: Goal: Ability to identify and utilize available resources and services will improve Outcome: Progressing Goal: Ability to manage health-related needs will improve Outcome: Progressing   Problem: Metabolic: Goal: Ability to maintain appropriate glucose levels will improve Outcome: Progressing   Problem: Skin Integrity: Goal: Risk for impaired skin integrity will decrease Outcome: Progressing   Problem: Tissue Perfusion: Goal: Adequacy of tissue perfusion will improve Outcome: Progressing   "

## 2024-04-23 NOTE — Plan of Care (Signed)
  Problem: Clinical Measurements: Goal: Will remain free from infection Outcome: Progressing Goal: Cardiovascular complication will be avoided Outcome: Progressing   Problem: Activity: Goal: Risk for activity intolerance will decrease Outcome: Progressing   Problem: Coping: Goal: Level of anxiety will decrease Outcome: Progressing   

## 2024-04-23 NOTE — TOC Progression Note (Signed)
 Transition of Care [facility]) - Progression Note    Patient Details  Name: Edward Waller MRN: [id number] Date of Birth: 11-11-1932  [facility] of [facility]) CM/SW Contact  NORMAN ASPEN, LCSW Phone Number: 04/23/2024, 3:53 PM  Clinical Narrative:     Alerted by MD/ notes that family now wishes to plan for home discharge with hospice services.  Spoke with granddaughter, [name], who confirms this and requests [facility] for hospice services.  Referral placed with [facility] (Melissa Stenson).  Anticipate pt to be ready for dc home tomorrow and will need PTAR transport home.  Will alert covering IP CM.  Expected Discharge Plan: Skilled Nursing Facility Barriers to Discharge: Continued Medical Work up               Expected Discharge Plan and Services       Living arrangements for the past 2 months: Single Family Home                                       Social Drivers of Health (SDOH) Interventions SDOH Screenings   Food Insecurity: No Food Insecurity (04/18/2024)  Housing: Low Risk (04/18/2024)  Transportation Needs: Unmet Transportation Needs (04/18/2024)  Utilities: Not At Risk (04/18/2024)  Social Connections: Socially Integrated (04/18/2024)  Stress: No Stress Concern Present (01/23/2024)   Received from [facility]  Tobacco Use: Low Risk (04/18/2024)    Readmission Risk Interventions    04/21/2024    3:23 PM 09/14/2023    4:46 PM 07/22/2023    2:34 PM  Readmission Risk Prevention Plan  Post Dischage Appt Complete    Medication Screening Complete    Transportation Screening Complete Complete Complete  PCP or Specialist Appt within 3-5 Days  Complete Complete  HRI or Home Care Consult  Complete Complete  Social Work Consult for Recovery Care Planning/Counseling  Complete Complete  Palliative Care Screening  Not Applicable Not Applicable  Medication Review [facility])  Complete Complete

## 2024-04-24 ENCOUNTER — Other Ambulatory Visit (HOSPITAL_COMMUNITY): Payer: Self-pay

## 2024-04-24 DIAGNOSIS — M869 Osteomyelitis, unspecified: Secondary | ICD-10-CM | POA: Diagnosis not present

## 2024-04-24 MED ORDER — LORATADINE 10 MG PO TABS
10.0000 mg | ORAL_TABLET | Freq: Every day | ORAL | Status: DC
  Administered 2024-04-24: 10 mg via ORAL
  Filled 2024-04-24: qty 1

## 2024-04-24 MED ORDER — ZINC OXIDE 40 % EX OINT
TOPICAL_OINTMENT | Freq: Two times a day (BID) | CUTANEOUS | 0 refills | Status: AC
  Filled 2024-04-24: qty 56.7, fill #0

## 2024-04-24 MED ORDER — COLLAGENASE 250 UNIT/GM EX OINT
TOPICAL_OINTMENT | Freq: Every day | CUTANEOUS | 0 refills | Status: AC
  Filled 2024-04-24: qty 30, 30d supply, fill #0

## 2024-04-24 MED ORDER — HYDROCORTISONE 1 % EX CREA
TOPICAL_CREAM | Freq: Three times a day (TID) | CUTANEOUS | Status: DC
  Filled 2024-04-24: qty 28

## 2024-04-24 MED ORDER — LORATADINE 10 MG PO TABS
10.0000 mg | ORAL_TABLET | Freq: Every day | ORAL | 0 refills | Status: AC
  Filled 2024-04-24: qty 30, 30d supply, fill #0

## 2024-04-24 NOTE — Progress Notes (Signed)
 Discharge meds in a secure bag delivered to patient by this RN.  Patient was on the phone with his wife when med was delivered- santyl  from inpatient stay will be sent home with patient along with desitin. Wife also stated patient has some creams at home. Hip dressing changed at 0600. Primary nurse and ICM updated that patient's wife was home

## 2024-04-24 NOTE — Progress Notes (Signed)
 Patient discharged home, transferred via [facility]

## 2024-04-24 NOTE — Discharge Summary (Signed)
 Physician Discharge Summary  Edward Waller DOB: 06/26/1932 DOA: 04/18/2024  PCP: [facility]  Admit date: 04/18/2024 Discharge date: 04/24/2024 Discharging to: home with hospice Code Status : DNR   Discharge Diagnoses:   Principal Problem:   Osteomyelitis of right hip [facility]) Active Problems:   Neurogenic bladder   Restless legs   History of DVT (deep vein thrombosis)   Hypothyroidism   BPH (benign prostatic hyperplasia)   Non-insulin  dependent type 2 diabetes mellitus (HCC)   Grade I diastolic dysfunction   Pressure injury of buttock   Chronic wound     Brief hospital course: Edward Waller is a 89 y.o. male with medical history significant of osteoarthritis, bilateral hand pain, history of DVT, grade 1 diastolic dysfunction, squamous cell carcinoma of the scalp, bilateral cataracts, left ankle fracture, chronic right hip pain, nephrolithiasis, hypertension, hypothyroidism, liver cyst, renal cyst, chronic lower back pain, BPH, restless syndrome, sleep apnea, spinal stenosis, paraplegia with neurogenic bladder with chronic indwelling suprapubic catheter after accident in September 2024.    Since then, he has been in various LTAC/SNF facilities.  He tells me that he has developed a pressure ulcer, that has worsened over the past couple months.  He was recently living at home by his caretakers.   He returns to the hospital with purulent discharge of his wound.  CT showed right ischial tuberosity osteomyelitis with overlying decubitus ulcer.  He was started on IV antibiotics and admitted to the hospital.   Subjective:  He has no pain. He states that he has decided to go home with hospice.    Assessment and Plan: Principal Problem:   Osteomyelitis of right hip (HCC) - s/p palliative care team meeting- plan for hospice- Family and aids will be helping him at home- working on arranging for dc when hospice has been set up at home   Active Problems:    Neurogenic bladder - has foley cath     Restless legs   History of DVT (deep vein thrombosis)   Hypothyroidism   BPH (benign prostatic hyperplasia)   Non-insulin  dependent type 2 diabetes mellitus (HCC)   Grade I diastolic dysfunction  h/o papular rash    Discharge Instructions   Allergies as of 04/24/2024       Reactions   Trazodone  And Nefazodone Other (See Comments)   Nightmares        Medication List     STOP taking these medications    gabapentin  100 MG capsule Commonly known as: NEURONTIN    nystatin  cream Commonly known as: MYCOSTATIN        TAKE these medications    acetaminophen  500 MG tablet Commonly known as: TYLENOL  Take 500 mg by mouth every 6 (six) hours as needed for mild pain (pain score 1-3).   artificial tears ophthalmic solution Place 1 drop into the right eye as needed for dry eyes (put at bedside for pt). What changed:  how to take this reasons to take this   aspirin  EC 81 MG tablet Take 1 tablet (81 mg total) by mouth daily. Swallow whole.   baclofen  10 MG tablet Commonly known as: LIORESAL  Take 5-10 mg by mouth See admin instructions. Take 5 mg by mouth twice a day and 10 mg at bedtime   bisacodyl  10 MG suppository Commonly known as: DULCOLAX Place 1 suppository (10 mg total) rectally daily after supper. What changed:  when to take this reasons to take this   carbamide peroxide 6.5 % OTIC solution Commonly  known as: DEBROX Place 5 drops into the right ear 2 (two) times daily.   collagenase  250 UNIT/GM ointment Commonly known as: SANTYL  Apply topically daily.   docusate sodium  100 MG capsule Commonly known as: Colace Take 1 capsule (100 mg total) by mouth daily as needed for up to 30 doses. What changed: reasons to take this   fluticasone  50 MCG/ACT nasal spray Commonly known as: FLONASE  Place 1 spray into both nostrils daily as needed for allergies or rhinitis.   levothyroxine  50 MCG tablet Commonly known as:  SYNTHROID  Take 50 mcg by mouth daily before breakfast.   liver oil-zinc  oxide 40 % ointment Commonly known as: DESITIN Apply topically 2 (two) times daily.   loratadine  10 MG tablet Commonly known as: CLARITIN  Take 1 tablet (10 mg total) by mouth daily.   melatonin 3 MG Tabs tablet Take 3-6 mg by mouth at bedtime as needed (sleep).   nitroGLYCERIN 0.4 MG SL tablet Commonly known as: NITROSTAT Place 0.4 mg under the tongue every 5 (five) minutes as needed for chest pain.   polyethylene glycol 17 g packet Commonly known as: MIRALAX  / GLYCOLAX  Take 34 g by mouth daily. Mix in 8 ounces a fluid per day What changed:  how much to take when to take this reasons to take this additional instructions   senna-docusate 8.6-50 MG tablet Commonly known as: Senokot-S Take 3 tablets by mouth daily at 6 (six) AM. What changed:  how much to take when to take this   triamcinolone  cream 0.1 % Commonly known as: KENALOG  Apply 1 Application topically 2 (two) times daily. What changed:  when to take this additional instructions            The results of significant diagnostics from this hospitalization (including imaging, microbiology, ancillary and laboratory) are listed below for reference.    IR Catheter Tube Change Result Date: 04/20/2024 EXAM: See complete note in [facility]. CHIEF COMPLAINT: See complete note in [facility]. HISTORY OF PRESENT ILLNESS: See complete note in [facility]. REVIEW OF SYSTEMS: See complete note in [facility]. PHYSICAL EXAMINATION: See complete note in [facility]. ASSESSMENT AND PLAN: See complete note in [facility]. Electronically Signed   By: Ester Sides M.D.   On: 04/20/2024 17:40   CT ABDOMEN PELVIS W CONTRAST Result Date: 04/18/2024 EXAM: CT ABDOMEN AND PELVIS WITH CONTRAST 04/18/2024 02:17:32 PM TECHNIQUE: CT of the abdomen and pelvis was performed with the administration of 100 mL of iohexol  (OMNIPAQUE ) 300 MG/ML solution. Multiplanar reformatted  images are provided for review. Automated exposure control, iterative reconstruction, and/or weight-based adjustment of the mA/kV was utilized to reduce the radiation dose to as low as reasonably achievable. COMPARISON: CT renal 09/10/2023 CLINICAL HISTORY: Abdominal pain, acute, nonlocalized; Right sided sacral wound. FINDINGS: LOWER CHEST: Coronary artery calcification. LIVER: Subcentimeter hypodense lesions within the liver. GALLBLADDER AND BILE DUCTS: Gallbladder is unremarkable. No biliary ductal dilatation. SPLEEN: No acute abnormality. PANCREAS: Diffusely atrophic pancreas. No focal lesion. Otherwise normal pancreatic contour. No surrounding inflammatory changes. No main pancreatic ductal dilatation. ADRENAL GLANDS: No acute abnormality. KIDNEYS, URETERS AND BLADDER: Low-density lesions of the kidneys likely represent simple renal cysts. Per consensus, no follow-up is needed for simple Bosniak type 1 and 2 renal cysts, unless the patient has a malignancy history or risk factors. No stones in the kidneys or ureters. No hydronephrosis. No perinephric or periureteral stranding. Suprapubic catheter with decompressed urinary bladder lumen. GI AND BOWEL: Tiny hiatal hernia. No small or large  bowel thickening or dilatation. The appendix is unremarkable. Increased stool burden throughout the colon. PERITONEUM AND RETROPERITONEUM: No ascites. No free air. VASCULATURE: Aorta is normal in caliber. LYMPH NODES: No lymphadenopathy. REPRODUCTIVE ORGANS: No acute abnormality. BONES AND SOFT TISSUES: Right femoral hardware with total right arthroplasty. Diffusely decreased bone density. Multilevel severe degenerative changes of the spine. Chronic L2 compression fracture. Dextroscoliosis of the thoracolumbar spine centered at the L2-L3 level. Limited evaluation of the pelvis due to scatter artifact originating from the right femoral hardware. Sofft tissue edema and dermal thickening along the right ischial tuberosity with  associated soft tissue emphysema and underlying cortical destruction of the right ischial tuberosity (2.91). IMPRESSION: 1. Right ischial tuberosity osteomyelitis with overlying decubitus ulcer. 2. Constipation. Electronically signed by: Kate Plummer MD 04/18/2024 03:53 PM EST RP Workstation: [id number]   Labs:   Basic Metabolic Panel: Recent Labs  Lab 04/18/24 1305 04/19/24 0836  NA 138 137  K 4.5 4.2  CL 102 104  CO2 28 20*  GLUCOSE 132* 143*  BUN 15 12  CREATININE 0.59* 0.57*  CALCIUM  9.8 9.4     CBC: Recent Labs  Lab 04/18/24 1305 04/19/24 0836  WBC 8.0 5.8  NEUTROABS 5.7  --   HGB 15.0 14.1  HCT 46.6 43.6  MCV 96.3 94.2  PLT 272 272         SIGNED:   True Atlas, MD  [facility] 04/24/2024, 11:14 AM Time taking on discharge: 50 minutes

## 2024-04-24 NOTE — TOC Transition Note (Signed)
 Transition of Care [facility]) - Discharge Note   Patient Details  Name: Edward Waller MRN: [id number] Date of Birth: 11-04-1932  [facility]) CM/SW Contact:  Jon ONEIDA Anon, RN Phone Number: 04/24/2024, 11:20 AM   Clinical Narrative:    Pt will discharge home with home hospice services through [facility]. RNCM spoke with pt HCPOA [name] who states she is out of town and will return this evening, gave permission to speak with pt spouse. RNCM spoke with pt spouse and she states she will be home to open the door when EMS arrives. RNCM called for transport at 1114 and scheduled it for 12pm. DC packet placed at RN station with signed DNR inside. No further ICM needs identified. Will sign off.   Final next level of care: Home w Hospice Care Barriers to Discharge: Barriers Resolved   Patient Goals and CMS Choice Patient states their goals for this hospitalization and ongoing recovery are:: Return home with Hospice services through [facility] CMS Medicare.gov Compare Post Acute Care list provided to:: Patient Represenative (must comment) Choice offered to / list presented to : [facility] POA / Guardian [employer] ownership interest in [facility].provided to:: [facility] POA / Guardian    Discharge Placement                Patient to be transferred to facility by: [facility] Name of family member notified: [name], Emergency Contact  [phone] Patient and family notified of of transfer: 04/24/24  Discharge Plan and Services Additional resources added to the After Visit Summary for                  DME Arranged: N/A DME Agency: NA                  Social Drivers of Health (SDOH) Interventions SDOH Screenings   Food Insecurity: No Food Insecurity (04/18/2024)  Housing: Low Risk (04/18/2024)  Transportation Needs: Unmet Transportation Needs (04/18/2024)  Utilities: Not At Risk (04/18/2024)  Social Connections: Socially Integrated (04/18/2024)  Stress: No Stress  Concern Present (01/23/2024)   Received from [facility]  Tobacco Use: Low Risk (04/18/2024)     Readmission Risk Interventions    04/21/2024    3:23 PM 09/14/2023    4:46 PM 07/22/2023    2:34 PM  Readmission Risk Prevention Plan  Post Dischage Appt Complete    Medication Screening Complete    Transportation Screening Complete Complete Complete  PCP or Specialist Appt within 3-5 Days  Complete Complete  HRI or Home Care Consult  Complete Complete  Social Work Consult for Recovery Care Planning/Counseling  Complete Complete  Palliative Care Screening  Not Applicable Not Applicable  Medication Review [employer])  Complete Complete

## 2024-04-26 ENCOUNTER — Other Ambulatory Visit (HOSPITAL_COMMUNITY): Payer: Self-pay

## 2024-04-26 ENCOUNTER — Telehealth: Admitting: Physician Assistant

## 2024-04-26 DIAGNOSIS — B027 Disseminated zoster: Secondary | ICD-10-CM | POA: Diagnosis not present

## 2024-04-26 MED ORDER — PREDNISONE 10 MG (21) PO TBPK: ORAL_TABLET | ORAL | 0 refills | Status: AC

## 2024-04-26 MED ORDER — VALACYCLOVIR HCL 1 G PO TABS: 1000.0000 mg | ORAL_TABLET | Freq: Three times a day (TID) | ORAL | 0 refills | Status: AC

## 2024-04-26 NOTE — Progress Notes (Signed)
 E-visit for Shingles   We are sorry that you are not feeling well. Here is how we plan to help!  Based on what you shared with me it looks like you have shingles.  Shingles or herpes zoster, is a common infection of the nerves.  It is a painful rash caused by the herpes zoster virus.  This is the same virus that causes chickenpox.  After a person has chickenpox, the virus remains inactive in the nerve cells.  Years later, the virus can become active again and travel to the skin.  It typically will appear on one side of the face or body.  Burning or shooting pain, tingling, or itching are early signs of the infection.  Blisters typically scab over in 7 to 10 days and clear up within 2-4 weeks. Shingles is only contagious to people that have never had the chickenpox, the chickenpox vaccine, or anyone who has a compromised immune system.  You should avoid contact with these type of people until your blisters scab over.  I have prescribed Valacyclovir  1g three times daily for 7 days and also Prednisone  6 day taper:  Day 1: Take 2 tablets with breakfast, 1 tablet with lunch, 1 tablet with supper, and 2 tablets at bedtime Day 2: Take 2 tablets with breakfast, 1 tablet with lunch, 1 tablet with supper, and 1 tablet at bedtime Day 3: Take 1 tablet at breakfast, 1 tablet with lunch, 1 tablet with supper, and 1 tablet at bedtime Day 4: Take 1 tablet with breakfast, 1 tablet with supper and 1 tablet at bedtime Day 5: Take 1 tablet with breakfast and 1 tablet with supper or bedtime Day 6: Take 1 tablet with breakfast    HOME CARE: Apply ice packs (wrapped in a thin towel), cool compresses, or soak in cool bath to help reduce pain. Use calamine lotion to calm itchy skin. Avoid scratching the rash. Avoid direct sunlight.  GET HELP RIGHT AWAY IF: Symptoms that dont away after treatment. A rash or blisters near your eye. Increased drainage, fever, or rash after treatment. Severe pain that doesnt go  away.   MAKE SURE YOU   Understand these instructions. Will watch your condition. Will get help right away if you are not doing well or get worse.  Thank you for choosing an e-visit. Your e-visit answers were reviewed by a board certified advanced clinical practitioner to complete your personal care plan. Depending upon the condition, your plan could have included both over the counter or prescription medications.  Please review your pharmacy choice. Make sure the pharmacy is open so you can pick up prescription now. If there is a problem, you may contact your provider through [employer] and have the prescription routed to another pharmacy.  Your safety is important to us . If you have drug allergies check your prescription carefully.   For the next 24 hours you can use MyChart to ask questions about todays visit, request a non-urgent call back, or ask for a work or school excuse.  You will get an email in the next two days asking about your experience. I hope that your e-visit has been valuable and will speed your recovery  I have spent 5 minutes in review of e-visit questionnaire, review and updating patient chart, medical decision making and response to patient.   Delon CHRISTELLA Dickinson, PA-C
